# Patient Record
Sex: Female | Born: 1960 | Race: Black or African American | Hispanic: No | Marital: Single | Smoking: Former smoker
Health system: Southern US, Community
[De-identification: ages and names within clinical notes are randomized; demographics above are authoritative.]

## PROBLEM LIST (undated history)

## (undated) DIAGNOSIS — J42 Unspecified chronic bronchitis: Secondary | ICD-10-CM

## (undated) DIAGNOSIS — F419 Anxiety disorder, unspecified: Secondary | ICD-10-CM

## (undated) DIAGNOSIS — K922 Gastrointestinal hemorrhage, unspecified: Secondary | ICD-10-CM

## (undated) DIAGNOSIS — R102 Pelvic and perineal pain: Secondary | ICD-10-CM

## (undated) DIAGNOSIS — J189 Pneumonia, unspecified organism: Secondary | ICD-10-CM

## (undated) DIAGNOSIS — G5603 Carpal tunnel syndrome, bilateral upper limbs: Secondary | ICD-10-CM

## (undated) DIAGNOSIS — G8929 Other chronic pain: Secondary | ICD-10-CM

## (undated) DIAGNOSIS — E7800 Pure hypercholesterolemia, unspecified: Secondary | ICD-10-CM

## (undated) DIAGNOSIS — E039 Hypothyroidism, unspecified: Secondary | ICD-10-CM

## (undated) DIAGNOSIS — I209 Angina pectoris, unspecified: Secondary | ICD-10-CM

## (undated) DIAGNOSIS — N950 Postmenopausal bleeding: Secondary | ICD-10-CM

## (undated) DIAGNOSIS — M545 Low back pain, unspecified: Secondary | ICD-10-CM

## (undated) DIAGNOSIS — D509 Iron deficiency anemia, unspecified: Secondary | ICD-10-CM

## (undated) DIAGNOSIS — B20 Human immunodeficiency virus [HIV] disease: Secondary | ICD-10-CM

## (undated) DIAGNOSIS — I10 Essential (primary) hypertension: Secondary | ICD-10-CM

## (undated) DIAGNOSIS — M7550 Bursitis of unspecified shoulder: Secondary | ICD-10-CM

## (undated) DIAGNOSIS — I2510 Atherosclerotic heart disease of native coronary artery without angina pectoris: Secondary | ICD-10-CM

## (undated) DIAGNOSIS — F32A Depression, unspecified: Secondary | ICD-10-CM

## (undated) DIAGNOSIS — D219 Benign neoplasm of connective and other soft tissue, unspecified: Secondary | ICD-10-CM

## (undated) DIAGNOSIS — Z9889 Other specified postprocedural states: Secondary | ICD-10-CM

## (undated) DIAGNOSIS — M1990 Unspecified osteoarthritis, unspecified site: Secondary | ICD-10-CM

## (undated) DIAGNOSIS — I639 Cerebral infarction, unspecified: Secondary | ICD-10-CM

## (undated) DIAGNOSIS — Z8619 Personal history of other infectious and parasitic diseases: Secondary | ICD-10-CM

## (undated) DIAGNOSIS — R638 Other symptoms and signs concerning food and fluid intake: Secondary | ICD-10-CM

## (undated) DIAGNOSIS — I509 Heart failure, unspecified: Secondary | ICD-10-CM

## (undated) DIAGNOSIS — Z87898 Personal history of other specified conditions: Secondary | ICD-10-CM

## (undated) DIAGNOSIS — Z5189 Encounter for other specified aftercare: Secondary | ICD-10-CM

## (undated) DIAGNOSIS — J449 Chronic obstructive pulmonary disease, unspecified: Secondary | ICD-10-CM

## (undated) DIAGNOSIS — K819 Cholecystitis, unspecified: Secondary | ICD-10-CM

## (undated) DIAGNOSIS — I219 Acute myocardial infarction, unspecified: Secondary | ICD-10-CM

## (undated) DIAGNOSIS — F329 Major depressive disorder, single episode, unspecified: Secondary | ICD-10-CM

## (undated) HISTORY — DX: Personal history of other specified conditions: Z87.898

## (undated) HISTORY — PX: COLONOSCOPY: SHX174

## (undated) HISTORY — DX: Encounter for other specified aftercare: Z51.89

## (undated) HISTORY — DX: Personal history of other infectious and parasitic diseases: Z86.19

## (undated) HISTORY — DX: Major depressive disorder, single episode, unspecified: F32.9

## (undated) HISTORY — DX: Other symptoms and signs concerning food and fluid intake: R63.8

## (undated) HISTORY — DX: Anxiety disorder, unspecified: F41.9

## (undated) HISTORY — PX: EYE SURGERY: SHX253

## (undated) HISTORY — DX: Pelvic and perineal pain: R10.2

## (undated) HISTORY — DX: Postmenopausal bleeding: N95.0

## (undated) HISTORY — DX: Depression, unspecified: F32.A

## (undated) HISTORY — DX: Essential (primary) hypertension: I10

## (undated) HISTORY — PX: KNEE ARTHROSCOPY: SHX127

## (undated) HISTORY — DX: Atherosclerotic heart disease of native coronary artery without angina pectoris: I25.10

## (undated) HISTORY — PX: CORONARY ANGIOPLASTY WITH STENT PLACEMENT: SHX49

---
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
## 1989-05-07 HISTORY — PX: CHOLECYSTECTOMY: SHX55

## 1991-09-07 HISTORY — PX: RETINAL LASER PROCEDURE: SHX2339

## 1993-09-06 DIAGNOSIS — IMO0001 Reserved for inherently not codable concepts without codable children: Secondary | ICD-10-CM

## 1993-09-06 DIAGNOSIS — Z5189 Encounter for other specified aftercare: Secondary | ICD-10-CM

## 1993-09-06 HISTORY — DX: Encounter for other specified aftercare: Z51.89

## 1993-09-06 HISTORY — DX: Reserved for inherently not codable concepts without codable children: IMO0001

## 2004-07-07 DIAGNOSIS — I219 Acute myocardial infarction, unspecified: Secondary | ICD-10-CM

## 2004-07-07 HISTORY — DX: Acute myocardial infarction, unspecified: I21.9

## 2007-06-23 ENCOUNTER — Inpatient Hospital Stay (HOSPITAL_COMMUNITY): Admission: EM | Admit: 2007-06-23 | Discharge: 2007-06-27 | Payer: Self-pay | Admitting: Emergency Medicine

## 2007-06-23 ENCOUNTER — Ambulatory Visit: Payer: Self-pay | Admitting: Internal Medicine

## 2007-06-27 ENCOUNTER — Encounter: Payer: Self-pay | Admitting: Internal Medicine

## 2007-07-08 HISTORY — PX: CARDIAC CATHETERIZATION: SHX172

## 2007-07-12 ENCOUNTER — Encounter: Payer: Self-pay | Admitting: Internal Medicine

## 2007-07-26 ENCOUNTER — Ambulatory Visit: Payer: Self-pay | Admitting: Internal Medicine

## 2007-07-26 ENCOUNTER — Encounter: Admission: RE | Admit: 2007-07-26 | Discharge: 2007-07-26 | Payer: Self-pay | Admitting: Internal Medicine

## 2007-07-26 DIAGNOSIS — B20 Human immunodeficiency virus [HIV] disease: Secondary | ICD-10-CM

## 2007-07-26 LAB — CONVERTED CEMR LAB
ALT: <8 units/L (ref 0–35)
Bilirubin Urine: NEGATIVE
CO2: 24 meq/L (ref 19–32)
Calcium: 9.1 mg/dL (ref 8.4–10.5)
Chlamydia, Swab/Urine, PCR: NEGATIVE
Chloride: 106 meq/L (ref 96–112)
Cholesterol: 162 mg/dL (ref 0–200)
Creatinine, Ser: 0.73 mg/dL (ref 0.40–1.20)
Eosinophils Relative: 2 % (ref 0–5)
GC Probe Amp, Urine: NEGATIVE
Glucose, Bld: 82 mg/dL (ref 70–99)
HCT: 36.1 % (ref 36.0–46.0)
HIV-1 antibody: POSITIVE — AB
HIV-2 Ab: UNDETERMINED — AB
HIV: REACTIVE
Hemoglobin, Urine: NEGATIVE
Hemoglobin: 11.2 g/dL — ABNORMAL LOW (ref 12.0–15.0)
Hep B Core Total Ab: POSITIVE — AB
Lymphocytes Relative: 45 % (ref 12–46)
Lymphs Abs: 1.9 10*3/uL (ref 0.7–4.0)
Monocytes Absolute: 0.5 10*3/uL (ref 0.1–1.0)
Monocytes Relative: 11 % (ref 3–12)
Neutro Abs: 1.8 10*3/uL (ref 1.7–7.7)
Protein, ur: NEGATIVE mg/dL
RBC: 3.59 M/uL — ABNORMAL LOW (ref 3.87–5.11)
RDW: 13.6 % (ref 11.5–15.5)
Total Bilirubin: 0.5 mg/dL (ref 0.3–1.2)
Total CHOL/HDL Ratio: 3
Triglycerides: 80 mg/dL (ref <150)
Urine Glucose: NEGATIVE mg/dL
Urobilinogen, UA: 0.2 (ref 0.0–1.0)
VLDL: 16 mg/dL (ref 0–40)
WBC: 4.3 10*3/uL (ref 4.0–10.5)

## 2007-07-31 ENCOUNTER — Encounter: Payer: Self-pay | Admitting: Internal Medicine

## 2007-08-02 ENCOUNTER — Ambulatory Visit: Payer: Self-pay | Admitting: Internal Medicine

## 2007-08-02 DIAGNOSIS — I2510 Atherosclerotic heart disease of native coronary artery without angina pectoris: Secondary | ICD-10-CM | POA: Insufficient documentation

## 2007-08-02 DIAGNOSIS — I10 Essential (primary) hypertension: Secondary | ICD-10-CM

## 2007-08-02 DIAGNOSIS — T85698A Other mechanical complication of other specified internal prosthetic devices, implants and grafts, initial encounter: Secondary | ICD-10-CM

## 2007-08-02 DIAGNOSIS — F411 Generalized anxiety disorder: Secondary | ICD-10-CM | POA: Insufficient documentation

## 2007-08-02 DIAGNOSIS — B009 Herpesviral infection, unspecified: Secondary | ICD-10-CM | POA: Insufficient documentation

## 2007-08-02 DIAGNOSIS — E785 Hyperlipidemia, unspecified: Secondary | ICD-10-CM

## 2007-08-02 DIAGNOSIS — F3289 Other specified depressive episodes: Secondary | ICD-10-CM | POA: Insufficient documentation

## 2007-08-02 DIAGNOSIS — F329 Major depressive disorder, single episode, unspecified: Secondary | ICD-10-CM

## 2007-08-10 ENCOUNTER — Encounter (HOSPITAL_COMMUNITY): Admission: RE | Admit: 2007-08-10 | Discharge: 2007-09-06 | Payer: Self-pay | Admitting: Cardiology

## 2007-08-15 ENCOUNTER — Telehealth: Payer: Self-pay | Admitting: Internal Medicine

## 2007-09-12 ENCOUNTER — Inpatient Hospital Stay (HOSPITAL_COMMUNITY): Admission: EM | Admit: 2007-09-12 | Discharge: 2007-09-15 | Payer: Self-pay | Admitting: Emergency Medicine

## 2007-09-13 ENCOUNTER — Encounter: Payer: Self-pay | Admitting: Internal Medicine

## 2007-09-15 ENCOUNTER — Ambulatory Visit: Payer: Self-pay | Admitting: *Deleted

## 2007-09-15 ENCOUNTER — Encounter: Payer: Self-pay | Admitting: Internal Medicine

## 2007-09-15 ENCOUNTER — Inpatient Hospital Stay (HOSPITAL_COMMUNITY): Admission: RE | Admit: 2007-09-15 | Discharge: 2007-09-21 | Payer: Self-pay | Admitting: *Deleted

## 2007-10-05 ENCOUNTER — Telehealth (INDEPENDENT_AMBULATORY_CARE_PROVIDER_SITE_OTHER): Payer: Self-pay | Admitting: *Deleted

## 2007-10-09 ENCOUNTER — Telehealth: Payer: Self-pay

## 2007-10-10 ENCOUNTER — Encounter: Admission: RE | Admit: 2007-10-10 | Discharge: 2007-10-10 | Payer: Self-pay | Admitting: Internal Medicine

## 2007-10-10 ENCOUNTER — Ambulatory Visit: Payer: Self-pay | Admitting: Internal Medicine

## 2007-10-10 LAB — CONVERTED CEMR LAB
ALT: <8 units/L (ref 0–35)
AST: 13 units/L (ref 0–37)
Alkaline Phosphatase: 40 units/L (ref 39–117)
Basophils Absolute: 0 10*3/uL (ref 0.0–0.1)
Basophils Relative: 0 % (ref 0–1)
Cholesterol: 154 mg/dL (ref 0–200)
Creatinine, Ser: 0.78 mg/dL (ref 0.40–1.20)
Eosinophils Absolute: 0.1 10*3/uL (ref 0.0–0.7)
Eosinophils Relative: 2 % (ref 0–5)
HCT: 34.9 % — ABNORMAL LOW (ref 36.0–46.0)
HIV 1 RNA Quant: 111 copies/mL — ABNORMAL HIGH (ref <50)
Hemoglobin: 11.2 g/dL — ABNORMAL LOW (ref 12.0–15.0)
LDL Cholesterol: 93 mg/dL (ref 0–99)
MCHC: 32.1 g/dL (ref 30.0–36.0)
Monocytes Absolute: 0.5 10*3/uL (ref 0.1–1.0)
Platelets: 292 10*3/uL (ref 150–400)
RDW: 13.6 % (ref 11.5–15.5)
Sodium: 138 meq/L (ref 135–145)
Total Bilirubin: 0.6 mg/dL (ref 0.3–1.2)
Total CHOL/HDL Ratio: 3.7
VLDL: 19 mg/dL (ref 0–40)

## 2007-10-11 DIAGNOSIS — Z87898 Personal history of other specified conditions: Secondary | ICD-10-CM

## 2007-10-25 ENCOUNTER — Encounter (INDEPENDENT_AMBULATORY_CARE_PROVIDER_SITE_OTHER): Payer: Self-pay | Admitting: *Deleted

## 2007-10-27 ENCOUNTER — Emergency Department (HOSPITAL_COMMUNITY): Admission: EM | Admit: 2007-10-27 | Discharge: 2007-10-27 | Payer: Self-pay | Admitting: Emergency Medicine

## 2007-10-27 ENCOUNTER — Ambulatory Visit: Payer: Self-pay | Admitting: Internal Medicine

## 2007-10-27 ENCOUNTER — Encounter: Payer: Self-pay | Admitting: Internal Medicine

## 2007-10-31 ENCOUNTER — Ambulatory Visit: Payer: Self-pay | Admitting: Internal Medicine

## 2007-10-31 ENCOUNTER — Ambulatory Visit (HOSPITAL_COMMUNITY): Admission: RE | Admit: 2007-10-31 | Discharge: 2007-10-31 | Payer: Self-pay | Admitting: Internal Medicine

## 2007-10-31 ENCOUNTER — Emergency Department (HOSPITAL_COMMUNITY): Admission: EM | Admit: 2007-10-31 | Discharge: 2007-11-01 | Payer: Self-pay | Admitting: Emergency Medicine

## 2007-10-31 ENCOUNTER — Encounter (INDEPENDENT_AMBULATORY_CARE_PROVIDER_SITE_OTHER): Payer: Self-pay | Admitting: *Deleted

## 2007-10-31 DIAGNOSIS — M25559 Pain in unspecified hip: Secondary | ICD-10-CM | POA: Insufficient documentation

## 2007-11-01 ENCOUNTER — Encounter: Payer: Self-pay | Admitting: Internal Medicine

## 2007-11-15 ENCOUNTER — Encounter (INDEPENDENT_AMBULATORY_CARE_PROVIDER_SITE_OTHER): Payer: Self-pay | Admitting: *Deleted

## 2007-11-22 ENCOUNTER — Telehealth: Payer: Self-pay | Admitting: Internal Medicine

## 2007-11-24 ENCOUNTER — Ambulatory Visit: Payer: Self-pay | Admitting: Internal Medicine

## 2007-11-30 ENCOUNTER — Encounter (INDEPENDENT_AMBULATORY_CARE_PROVIDER_SITE_OTHER): Payer: Self-pay | Admitting: *Deleted

## 2007-11-30 ENCOUNTER — Ambulatory Visit: Payer: Self-pay | Admitting: Internal Medicine

## 2007-11-30 ENCOUNTER — Encounter: Admission: RE | Admit: 2007-11-30 | Discharge: 2007-11-30 | Payer: Self-pay | Admitting: Internal Medicine

## 2007-11-30 LAB — CONVERTED CEMR LAB
ALT: 10 units/L (ref 0–35)
AST: 13 units/L (ref 0–37)
Basophils Absolute: 0 10*3/uL (ref 0.0–0.1)
Basophils Relative: 1 % (ref 0–1)
Chloride: 106 meq/L (ref 96–112)
Creatinine, Ser: 0.7 mg/dL (ref 0.40–1.20)
Eosinophils Relative: 10 % — ABNORMAL HIGH (ref 0–5)
Hemoglobin: 11.1 g/dL — ABNORMAL LOW (ref 12.0–15.0)
MCHC: 31.7 g/dL (ref 30.0–36.0)
Monocytes Absolute: 0.7 10*3/uL (ref 0.1–1.0)
Neutro Abs: 1.8 10*3/uL (ref 1.7–7.7)
Platelets: 238 10*3/uL (ref 150–400)
RDW: 13.1 % (ref 11.5–15.5)
Total Bilirubin: 0.3 mg/dL (ref 0.3–1.2)

## 2007-12-01 ENCOUNTER — Ambulatory Visit: Payer: Self-pay | Admitting: Internal Medicine

## 2007-12-01 DIAGNOSIS — J209 Acute bronchitis, unspecified: Secondary | ICD-10-CM

## 2007-12-01 DIAGNOSIS — K130 Diseases of lips: Secondary | ICD-10-CM | POA: Insufficient documentation

## 2007-12-05 ENCOUNTER — Encounter: Payer: Self-pay | Admitting: Internal Medicine

## 2007-12-07 ENCOUNTER — Encounter (HOSPITAL_COMMUNITY): Admission: RE | Admit: 2007-12-07 | Discharge: 2008-03-06 | Payer: Self-pay | Admitting: Cardiology

## 2007-12-10 ENCOUNTER — Emergency Department (HOSPITAL_COMMUNITY): Admission: EM | Admit: 2007-12-10 | Discharge: 2007-12-10 | Payer: Self-pay | Admitting: Emergency Medicine

## 2007-12-19 ENCOUNTER — Ambulatory Visit: Payer: Self-pay | Admitting: Internal Medicine

## 2007-12-19 DIAGNOSIS — R110 Nausea: Secondary | ICD-10-CM | POA: Insufficient documentation

## 2007-12-25 ENCOUNTER — Encounter: Payer: Self-pay | Admitting: Internal Medicine

## 2008-01-25 ENCOUNTER — Emergency Department (HOSPITAL_COMMUNITY): Admission: EM | Admit: 2008-01-25 | Discharge: 2008-01-25 | Payer: Self-pay | Admitting: Family Medicine

## 2008-01-31 ENCOUNTER — Emergency Department (HOSPITAL_COMMUNITY): Admission: EM | Admit: 2008-01-31 | Discharge: 2008-01-31 | Payer: Self-pay | Admitting: Emergency Medicine

## 2008-02-14 ENCOUNTER — Ambulatory Visit: Payer: Self-pay | Admitting: Internal Medicine

## 2008-02-14 ENCOUNTER — Telehealth: Payer: Self-pay

## 2008-02-14 DIAGNOSIS — M67919 Unspecified disorder of synovium and tendon, unspecified shoulder: Secondary | ICD-10-CM | POA: Insufficient documentation

## 2008-02-14 DIAGNOSIS — M719 Bursopathy, unspecified: Secondary | ICD-10-CM

## 2008-02-29 ENCOUNTER — Emergency Department (HOSPITAL_COMMUNITY): Admission: EM | Admit: 2008-02-29 | Discharge: 2008-02-29 | Payer: Self-pay | Admitting: Emergency Medicine

## 2008-03-04 ENCOUNTER — Emergency Department (HOSPITAL_COMMUNITY): Admission: EM | Admit: 2008-03-04 | Discharge: 2008-03-05 | Payer: Self-pay | Admitting: Emergency Medicine

## 2008-03-07 ENCOUNTER — Encounter (HOSPITAL_COMMUNITY): Admission: RE | Admit: 2008-03-07 | Discharge: 2008-04-06 | Payer: Self-pay | Admitting: Cardiology

## 2008-03-18 ENCOUNTER — Encounter: Admission: RE | Admit: 2008-03-18 | Discharge: 2008-03-18 | Payer: Self-pay | Admitting: Internal Medicine

## 2008-03-18 ENCOUNTER — Ambulatory Visit: Payer: Self-pay | Admitting: Internal Medicine

## 2008-03-18 LAB — CONVERTED CEMR LAB
AST: 17 units/L (ref 0–37)
Alkaline Phosphatase: 59 units/L (ref 39–117)
BUN: 10 mg/dL (ref 6–23)
Basophils Relative: 0 % (ref 0–1)
Calcium: 9 mg/dL (ref 8.4–10.5)
Creatinine, Ser: 0.53 mg/dL (ref 0.40–1.20)
Eosinophils Absolute: 0.2 10*3/uL (ref 0.0–0.7)
Hemoglobin: 11.1 g/dL — ABNORMAL LOW (ref 12.0–15.0)
MCHC: 31.4 g/dL (ref 30.0–36.0)
MCV: 85.9 fL (ref 78.0–100.0)
Monocytes Absolute: 0.6 10*3/uL (ref 0.1–1.0)
Monocytes Relative: 11 % (ref 3–12)
RBC: 4.12 M/uL (ref 3.87–5.11)

## 2008-04-02 ENCOUNTER — Ambulatory Visit: Payer: Self-pay | Admitting: Internal Medicine

## 2008-04-02 DIAGNOSIS — N644 Mastodynia: Secondary | ICD-10-CM

## 2008-04-02 LAB — CONVERTED CEMR LAB
FSH: 2.5 milliintl units/mL
Prolactin: 32 ng/mL

## 2008-04-03 ENCOUNTER — Telehealth: Payer: Self-pay | Admitting: Licensed Clinical Social Worker

## 2008-04-05 ENCOUNTER — Emergency Department (HOSPITAL_COMMUNITY): Admission: EM | Admit: 2008-04-05 | Discharge: 2008-04-06 | Payer: Self-pay | Admitting: Emergency Medicine

## 2008-04-07 ENCOUNTER — Emergency Department (HOSPITAL_COMMUNITY): Admission: EM | Admit: 2008-04-07 | Discharge: 2008-04-07 | Payer: Self-pay | Admitting: Emergency Medicine

## 2008-04-16 ENCOUNTER — Telehealth: Payer: Self-pay | Admitting: Internal Medicine

## 2008-04-24 ENCOUNTER — Encounter: Payer: Self-pay | Admitting: Internal Medicine

## 2008-04-29 ENCOUNTER — Encounter: Payer: Self-pay | Admitting: Licensed Clinical Social Worker

## 2008-04-29 ENCOUNTER — Encounter: Payer: Self-pay | Admitting: Internal Medicine

## 2008-05-01 ENCOUNTER — Emergency Department (HOSPITAL_COMMUNITY): Admission: EM | Admit: 2008-05-01 | Discharge: 2008-05-01 | Payer: Self-pay | Admitting: Emergency Medicine

## 2008-05-02 ENCOUNTER — Telehealth (INDEPENDENT_AMBULATORY_CARE_PROVIDER_SITE_OTHER): Payer: Self-pay | Admitting: *Deleted

## 2008-05-03 ENCOUNTER — Ambulatory Visit: Payer: Self-pay | Admitting: Internal Medicine

## 2008-05-03 DIAGNOSIS — M79609 Pain in unspecified limb: Secondary | ICD-10-CM | POA: Insufficient documentation

## 2008-05-07 ENCOUNTER — Encounter: Payer: Self-pay | Admitting: Internal Medicine

## 2008-05-09 ENCOUNTER — Telehealth: Payer: Self-pay | Admitting: Internal Medicine

## 2008-05-30 ENCOUNTER — Emergency Department (HOSPITAL_COMMUNITY): Admission: EM | Admit: 2008-05-30 | Discharge: 2008-05-31 | Payer: Self-pay | Admitting: Emergency Medicine

## 2008-06-10 ENCOUNTER — Ambulatory Visit: Payer: Self-pay | Admitting: *Deleted

## 2008-06-10 ENCOUNTER — Telehealth: Payer: Self-pay

## 2008-06-10 ENCOUNTER — Inpatient Hospital Stay (HOSPITAL_COMMUNITY): Admission: EM | Admit: 2008-06-10 | Discharge: 2008-06-12 | Payer: Self-pay | Admitting: Family Medicine

## 2008-06-18 ENCOUNTER — Encounter (INDEPENDENT_AMBULATORY_CARE_PROVIDER_SITE_OTHER): Payer: Self-pay | Admitting: Internal Medicine

## 2008-07-02 ENCOUNTER — Telehealth (INDEPENDENT_AMBULATORY_CARE_PROVIDER_SITE_OTHER): Payer: Self-pay | Admitting: *Deleted

## 2008-07-04 ENCOUNTER — Ambulatory Visit: Payer: Self-pay | Admitting: Internal Medicine

## 2008-07-04 LAB — CONVERTED CEMR LAB
ALT: 12 units/L (ref 0–35)
AST: 21 units/L (ref 0–37)
Albumin: 4.2 g/dL (ref 3.5–5.2)
BUN: 12 mg/dL (ref 6–23)
Basophils Absolute: 0 10*3/uL (ref 0.0–0.1)
Basophils Relative: 1 % (ref 0–1)
Calcium: 9.7 mg/dL (ref 8.4–10.5)
Chloride: 102 meq/L (ref 96–112)
MCHC: 29.9 g/dL — ABNORMAL LOW (ref 30.0–36.0)
Monocytes Relative: 16 % — ABNORMAL HIGH (ref 3–12)
Neutro Abs: 1.7 10*3/uL (ref 1.7–7.7)
Neutrophils Relative %: 38 % — ABNORMAL LOW (ref 43–77)
Platelets: 268 10*3/uL (ref 150–400)
Potassium: 4.7 meq/L (ref 3.5–5.3)
RBC: 4.67 M/uL (ref 3.87–5.11)
Sodium: 136 meq/L (ref 135–145)
Total Protein: 8.2 g/dL (ref 6.0–8.3)
WBC: 4.3 10*3/uL (ref 4.0–10.5)

## 2008-07-16 ENCOUNTER — Ambulatory Visit: Payer: Self-pay | Admitting: Internal Medicine

## 2008-10-21 ENCOUNTER — Ambulatory Visit: Payer: Self-pay | Admitting: Internal Medicine

## 2008-10-21 LAB — CONVERTED CEMR LAB
ALT: 13 units/L (ref 0–35)
Alkaline Phosphatase: 40 units/L (ref 39–117)
Basophils Absolute: 0 10*3/uL (ref 0.0–0.1)
Creatinine, Ser: 0.77 mg/dL (ref 0.40–1.20)
Eosinophils Absolute: 0.1 10*3/uL (ref 0.0–0.7)
Eosinophils Relative: 2 % (ref 0–5)
HCT: 32.2 % — ABNORMAL LOW (ref 36.0–46.0)
HIV-1 RNA Quant, Log: 3.32 — ABNORMAL HIGH (ref <1.68)
MCHC: 32.3 g/dL (ref 30.0–36.0)
MCV: 82.8 fL (ref 78.0–100.0)
Monocytes Absolute: 0.5 10*3/uL (ref 0.1–1.0)
Platelets: 224 10*3/uL (ref 150–400)
RDW: 15.7 % — ABNORMAL HIGH (ref 11.5–15.5)
Sodium: 143 meq/L (ref 135–145)
Total Bilirubin: 0.4 mg/dL (ref 0.3–1.2)
Total Protein: 8.3 g/dL (ref 6.0–8.3)

## 2008-10-30 ENCOUNTER — Ambulatory Visit: Payer: Self-pay | Admitting: Internal Medicine

## 2008-10-30 DIAGNOSIS — B3731 Acute candidiasis of vulva and vagina: Secondary | ICD-10-CM | POA: Insufficient documentation

## 2008-10-30 DIAGNOSIS — B373 Candidiasis of vulva and vagina: Secondary | ICD-10-CM

## 2008-12-04 ENCOUNTER — Encounter: Payer: Self-pay | Admitting: Internal Medicine

## 2008-12-09 ENCOUNTER — Emergency Department (HOSPITAL_COMMUNITY): Admission: EM | Admit: 2008-12-09 | Discharge: 2008-12-09 | Payer: Self-pay | Admitting: Family Medicine

## 2008-12-16 ENCOUNTER — Encounter: Payer: Self-pay | Admitting: Internal Medicine

## 2008-12-16 ENCOUNTER — Ambulatory Visit: Payer: Self-pay | Admitting: Internal Medicine

## 2008-12-16 LAB — CONVERTED CEMR LAB
ALT: 9 units/L (ref 0–35)
AST: 16 units/L (ref 0–37)
Albumin: 3.8 g/dL (ref 3.5–5.2)
BUN: 10 mg/dL (ref 6–23)
Basophils Absolute: 0 10*3/uL (ref 0.0–0.1)
Basophils Relative: 0 % (ref 0–1)
CO2: 19 meq/L (ref 19–32)
Calcium: 8.5 mg/dL (ref 8.4–10.5)
Chloride: 108 meq/L (ref 96–112)
Cholesterol: 207 mg/dL — ABNORMAL HIGH (ref 0–200)
Creatinine, Ser: 0.66 mg/dL (ref 0.40–1.20)
Eosinophils Absolute: 0.1 10*3/uL (ref 0.0–0.7)
GFR calc Af Amer: >60 mL/min (ref >60)
HIV 1 RNA Quant: 213 copies/mL — ABNORMAL HIGH (ref <48)
HIV-1 RNA Quant, Log: 2.33 — ABNORMAL HIGH (ref <1.68)
MCHC: 29.6 g/dL — ABNORMAL LOW (ref 30.0–36.0)
MCV: 77.8 fL — ABNORMAL LOW (ref 78.0–100.0)
Monocytes Relative: 9 % (ref 3–12)
Neutro Abs: 3 10*3/uL (ref 1.7–7.7)
Neutrophils Relative %: 67 % (ref 43–77)
Platelets: 255 10*3/uL (ref 150–400)
Potassium: 3.7 meq/L (ref 3.5–5.3)
RBC: 4.6 M/uL (ref 3.87–5.11)
Total CHOL/HDL Ratio: 3.8

## 2008-12-20 ENCOUNTER — Encounter: Payer: Self-pay | Admitting: Internal Medicine

## 2009-01-08 ENCOUNTER — Ambulatory Visit: Payer: Self-pay | Admitting: Internal Medicine

## 2009-02-10 ENCOUNTER — Telehealth (INDEPENDENT_AMBULATORY_CARE_PROVIDER_SITE_OTHER): Payer: Self-pay | Admitting: Licensed Clinical Social Worker

## 2009-03-07 ENCOUNTER — Emergency Department (HOSPITAL_COMMUNITY): Admission: EM | Admit: 2009-03-07 | Discharge: 2009-03-07 | Payer: Self-pay | Admitting: Emergency Medicine

## 2009-04-08 ENCOUNTER — Ambulatory Visit: Payer: Self-pay | Admitting: Internal Medicine

## 2009-04-08 LAB — CONVERTED CEMR LAB: HIV-1 RNA Quant, Log: 2.09 — ABNORMAL HIGH (ref <1.68)

## 2009-04-09 LAB — CONVERTED CEMR LAB
ALT: 9 units/L (ref 0–35)
AST: 14 units/L (ref 0–37)
Basophils Absolute: 0 10*3/uL (ref 0.0–0.1)
CO2: 24 meq/L (ref 19–32)
Calcium: 8.4 mg/dL (ref 8.4–10.5)
Chloride: 106 meq/L (ref 96–112)
Creatinine, Ser: 0.72 mg/dL (ref 0.40–1.20)
Eosinophils Absolute: 0.2 10*3/uL (ref 0.0–0.7)
Eosinophils Relative: 5 % (ref 0–5)
HCT: 32.1 % — ABNORMAL LOW (ref 36.0–46.0)
Lymphocytes Relative: 33 % (ref 12–46)
Neutrophils Relative %: 52 % (ref 43–77)
Platelets: 255 10*3/uL (ref 150–400)
Potassium: 4.1 meq/L (ref 3.5–5.3)
RDW: 16.8 % — ABNORMAL HIGH (ref 11.5–15.5)
Sodium: 140 meq/L (ref 135–145)
Total Protein: 7.2 g/dL (ref 6.0–8.3)
WBC: 4.4 10*3/uL (ref 4.0–10.5)

## 2009-04-11 ENCOUNTER — Encounter: Payer: Self-pay | Admitting: Internal Medicine

## 2009-04-11 DIAGNOSIS — D509 Iron deficiency anemia, unspecified: Secondary | ICD-10-CM | POA: Insufficient documentation

## 2009-04-14 ENCOUNTER — Emergency Department (HOSPITAL_COMMUNITY): Admission: EM | Admit: 2009-04-14 | Discharge: 2009-04-14 | Payer: Self-pay | Admitting: Emergency Medicine

## 2009-04-22 ENCOUNTER — Ambulatory Visit: Payer: Self-pay | Admitting: Internal Medicine

## 2009-04-22 DIAGNOSIS — N926 Irregular menstruation, unspecified: Secondary | ICD-10-CM | POA: Insufficient documentation

## 2009-04-28 ENCOUNTER — Encounter: Payer: Self-pay | Admitting: Internal Medicine

## 2009-05-23 ENCOUNTER — Encounter (INDEPENDENT_AMBULATORY_CARE_PROVIDER_SITE_OTHER): Payer: Self-pay | Admitting: Cardiology

## 2009-05-23 ENCOUNTER — Inpatient Hospital Stay (HOSPITAL_COMMUNITY): Admission: EM | Admit: 2009-05-23 | Discharge: 2009-05-25 | Payer: Self-pay | Admitting: Emergency Medicine

## 2009-05-23 ENCOUNTER — Ambulatory Visit: Payer: Self-pay | Admitting: Internal Medicine

## 2009-05-23 ENCOUNTER — Encounter: Payer: Self-pay | Admitting: Internal Medicine

## 2009-06-17 ENCOUNTER — Telehealth (INDEPENDENT_AMBULATORY_CARE_PROVIDER_SITE_OTHER): Payer: Self-pay | Admitting: Licensed Clinical Social Worker

## 2009-06-23 ENCOUNTER — Ambulatory Visit: Payer: Self-pay | Admitting: Internal Medicine

## 2009-06-23 LAB — CONVERTED CEMR LAB
BUN: 12 mg/dL (ref 6–23)
Basophils Relative: 1 % (ref 0–1)
CO2: 22 meq/L (ref 19–32)
Creatinine, Ser: 0.63 mg/dL (ref 0.40–1.20)
Eosinophils Absolute: 0.1 10*3/uL (ref 0.0–0.7)
Glucose, Bld: 84 mg/dL (ref 70–99)
HIV 1 RNA Quant: <48 copies/mL (ref <48)
HIV-1 RNA Quant, Log: <1.68 (ref <1.68)
Hemoglobin: 9.6 g/dL — ABNORMAL LOW (ref 12.0–15.0)
MCHC: 28.9 g/dL — ABNORMAL LOW (ref 30.0–36.0)
MCV: 77.8 fL — ABNORMAL LOW (ref >=78.0)
Monocytes Absolute: 0.4 10*3/uL (ref 0.1–1.0)
Monocytes Relative: 11 % (ref 3–12)
RBC: 4.27 M/uL (ref 3.87–5.11)
RDW: 16.1 % — ABNORMAL HIGH (ref 11.5–15.5)
Total Bilirubin: 0.4 mg/dL (ref 0.3–1.2)
Total Protein: 7.4 g/dL (ref 6.0–8.3)

## 2009-07-08 ENCOUNTER — Ambulatory Visit: Payer: Self-pay | Admitting: Internal Medicine

## 2009-07-09 ENCOUNTER — Telehealth: Payer: Self-pay | Admitting: Licensed Clinical Social Worker

## 2009-07-15 ENCOUNTER — Ambulatory Visit: Payer: Self-pay | Admitting: Infectious Disease

## 2009-07-23 ENCOUNTER — Ambulatory Visit: Payer: Self-pay | Admitting: Obstetrics and Gynecology

## 2009-07-23 LAB — CONVERTED CEMR LAB
Hemoglobin: 9.5 g/dL — ABNORMAL LOW (ref 12.0–15.0)
RBC: 4.18 M/uL (ref 3.87–5.11)
RDW: 16.5 % — ABNORMAL HIGH (ref 11.5–15.5)
T3, Total: 125.3 ng/dL (ref 80.0–204.0)
TSH: 2.292 microintl units/mL (ref 0.350–4.500)
WBC: 6.3 10*3/uL (ref 4.0–10.5)

## 2009-07-28 ENCOUNTER — Ambulatory Visit (HOSPITAL_COMMUNITY): Admission: RE | Admit: 2009-07-28 | Discharge: 2009-07-28 | Payer: Self-pay | Admitting: Family Medicine

## 2009-07-30 ENCOUNTER — Encounter: Admission: RE | Admit: 2009-07-30 | Discharge: 2009-07-30 | Payer: Self-pay | Admitting: Obstetrics and Gynecology

## 2009-08-08 ENCOUNTER — Encounter: Admission: RE | Admit: 2009-08-08 | Discharge: 2009-08-08 | Payer: Self-pay | Admitting: Obstetrics and Gynecology

## 2009-08-13 ENCOUNTER — Ambulatory Visit: Payer: Self-pay | Admitting: Obstetrics and Gynecology

## 2009-08-18 ENCOUNTER — Telehealth: Payer: Self-pay | Admitting: Internal Medicine

## 2009-09-12 ENCOUNTER — Emergency Department (HOSPITAL_COMMUNITY): Admission: EM | Admit: 2009-09-12 | Discharge: 2009-09-12 | Payer: Self-pay | Admitting: Emergency Medicine

## 2009-09-12 ENCOUNTER — Telehealth (INDEPENDENT_AMBULATORY_CARE_PROVIDER_SITE_OTHER): Payer: Self-pay | Admitting: *Deleted

## 2009-09-19 ENCOUNTER — Ambulatory Visit: Payer: Self-pay | Admitting: Internal Medicine

## 2009-09-19 DIAGNOSIS — H6690 Otitis media, unspecified, unspecified ear: Secondary | ICD-10-CM | POA: Insufficient documentation

## 2009-09-23 ENCOUNTER — Telehealth: Payer: Self-pay | Admitting: Internal Medicine

## 2009-10-14 ENCOUNTER — Ambulatory Visit: Payer: Self-pay | Admitting: Internal Medicine

## 2009-10-14 LAB — CONVERTED CEMR LAB
ALT: 9 units/L (ref 0–35)
AST: 14 units/L (ref 0–37)
Basophils Absolute: 0 10*3/uL (ref 0.0–0.1)
Calcium: 9.5 mg/dL (ref 8.4–10.5)
Chloride: 107 meq/L (ref 96–112)
Creatinine, Ser: 0.75 mg/dL (ref 0.40–1.20)
Eosinophils Absolute: 0.1 10*3/uL (ref 0.0–0.7)
Eosinophils Relative: 3 % (ref 0–5)
HCT: 36.2 % (ref 36.0–46.0)
Lymphocytes Relative: 43 % (ref 12–46)
MCV: 77.5 fL — ABNORMAL LOW (ref >=78.0)
Platelets: 249 10*3/uL (ref 150–400)
RDW: 17.9 % — ABNORMAL HIGH (ref 11.5–15.5)

## 2009-10-22 ENCOUNTER — Ambulatory Visit: Payer: Self-pay | Admitting: Internal Medicine

## 2009-11-28 ENCOUNTER — Encounter: Payer: Self-pay | Admitting: Internal Medicine

## 2009-11-28 ENCOUNTER — Ambulatory Visit: Payer: Self-pay | Admitting: Internal Medicine

## 2009-11-28 ENCOUNTER — Inpatient Hospital Stay (HOSPITAL_COMMUNITY): Admission: EM | Admit: 2009-11-28 | Discharge: 2009-11-28 | Payer: Self-pay | Admitting: Emergency Medicine

## 2010-01-24 ENCOUNTER — Emergency Department (HOSPITAL_COMMUNITY): Admission: EM | Admit: 2010-01-24 | Discharge: 2010-01-24 | Payer: Self-pay | Admitting: Emergency Medicine
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
## 2010-01-26 ENCOUNTER — Ambulatory Visit: Payer: Self-pay | Admitting: Internal Medicine

## 2010-01-26 LAB — CONVERTED CEMR LAB
Albumin: 3.7 g/dL (ref 3.5–5.2)
Alkaline Phosphatase: 48 units/L (ref 39–117)
BUN: 12 mg/dL (ref 6–23)
Basophils Absolute: 0 10*3/uL (ref 0.0–0.1)
Basophils Relative: 0 % (ref 0–1)
Eosinophils Relative: 3 % (ref 0–5)
Glucose, Bld: 82 mg/dL (ref 70–99)
HCT: 36.1 % (ref 36.0–46.0)
HIV 1 RNA Quant: <48 copies/mL (ref <48)
HIV-1 RNA Quant, Log: <1.68 (ref <1.68)
Hemoglobin: 10.9 g/dL — ABNORMAL LOW (ref 12.0–15.0)
MCHC: 30.2 g/dL (ref 30.0–36.0)
MCV: 76.6 fL — ABNORMAL LOW (ref 78.0–100.0)
Monocytes Absolute: 0.3 10*3/uL (ref 0.1–1.0)
RDW: 17.1 % — ABNORMAL HIGH (ref 11.5–15.5)
Total Bilirubin: 0.4 mg/dL (ref 0.3–1.2)

## 2010-02-11 ENCOUNTER — Ambulatory Visit: Payer: Self-pay | Admitting: Internal Medicine

## 2010-02-25 ENCOUNTER — Other Ambulatory Visit: Payer: Self-pay | Admitting: Emergency Medicine

## 2010-02-26 ENCOUNTER — Inpatient Hospital Stay (HOSPITAL_COMMUNITY): Admission: AD | Admit: 2010-02-26 | Discharge: 2010-03-02 | Payer: Self-pay | Admitting: Psychiatry

## 2010-02-26 ENCOUNTER — Encounter: Payer: Self-pay | Admitting: Internal Medicine

## 2010-02-26 ENCOUNTER — Ambulatory Visit: Payer: Self-pay | Admitting: Psychiatry

## 2010-03-05 ENCOUNTER — Emergency Department (HOSPITAL_COMMUNITY): Admission: EM | Admit: 2010-03-05 | Discharge: 2010-03-06 | Payer: Self-pay | Admitting: Emergency Medicine

## 2010-05-01 ENCOUNTER — Ambulatory Visit: Payer: Self-pay | Admitting: Internal Medicine

## 2010-05-01 LAB — CONVERTED CEMR LAB: Pap Smear: NEGATIVE

## 2010-05-08 ENCOUNTER — Encounter: Payer: Self-pay | Admitting: Internal Medicine

## 2010-05-13 ENCOUNTER — Ambulatory Visit: Payer: Self-pay | Admitting: Internal Medicine

## 2010-05-13 LAB — CONVERTED CEMR LAB
ALT: 9 units/L (ref 0–35)
AST: 15 units/L (ref 0–37)
Albumin: 3.9 g/dL (ref 3.5–5.2)
Basophils Absolute: 0 10*3/uL (ref 0.0–0.1)
CO2: 28 meq/L (ref 19–32)
Calcium: 9.4 mg/dL (ref 8.4–10.5)
Chloride: 106 meq/L (ref 96–112)
Cholesterol: 207 mg/dL — ABNORMAL HIGH (ref 0–200)
Creatinine, Ser: 0.87 mg/dL (ref 0.40–1.20)
HIV 1 RNA Quant: <20 copies/mL (ref <48)
HIV-1 RNA Quant, Log: <1.3 (ref <1.68)
Hemoglobin: 12 g/dL (ref 12.0–15.0)
Lymphocytes Relative: 32 % (ref 12–46)
Monocytes Absolute: 0.3 10*3/uL (ref 0.1–1.0)
Neutro Abs: 2.1 10*3/uL (ref 1.7–7.7)
Neutrophils Relative %: 57 % (ref 43–77)
Potassium: 3.7 meq/L (ref 3.5–5.3)
RDW: 17.8 % — ABNORMAL HIGH (ref 11.5–15.5)
Sodium: 144 meq/L (ref 135–145)
Total Protein: 7 g/dL (ref 6.0–8.3)

## 2010-05-26 ENCOUNTER — Telehealth (INDEPENDENT_AMBULATORY_CARE_PROVIDER_SITE_OTHER): Payer: Self-pay | Admitting: *Deleted

## 2010-05-27 ENCOUNTER — Ambulatory Visit: Payer: Self-pay | Admitting: Internal Medicine

## 2010-05-27 DIAGNOSIS — N898 Other specified noninflammatory disorders of vagina: Secondary | ICD-10-CM | POA: Insufficient documentation

## 2010-05-27 DIAGNOSIS — N760 Acute vaginitis: Secondary | ICD-10-CM | POA: Insufficient documentation

## 2010-05-28 ENCOUNTER — Encounter: Payer: Self-pay | Admitting: Internal Medicine

## 2010-05-28 LAB — CONVERTED CEMR LAB
Candida species: NEGATIVE
Gardnerella vaginalis: POSITIVE — AB

## 2010-06-04 ENCOUNTER — Encounter: Payer: Self-pay | Admitting: Internal Medicine

## 2010-06-04 ENCOUNTER — Telehealth: Payer: Self-pay | Admitting: Internal Medicine

## 2010-06-11 ENCOUNTER — Telehealth: Payer: Self-pay | Admitting: Internal Medicine

## 2010-06-12 ENCOUNTER — Ambulatory Visit: Payer: Self-pay | Admitting: Internal Medicine

## 2010-06-18 ENCOUNTER — Encounter: Payer: Self-pay | Admitting: Internal Medicine

## 2010-06-29 ENCOUNTER — Ambulatory Visit: Payer: Self-pay | Admitting: Internal Medicine

## 2010-06-30 ENCOUNTER — Encounter: Payer: Self-pay | Admitting: Internal Medicine

## 2010-06-30 LAB — CONVERTED CEMR LAB
Calcium: 9.1 mg/dL (ref 8.4–10.5)
Candida species: NEGATIVE
Gardnerella vaginalis: POSITIVE — AB
Potassium: 3.6 meq/L (ref 3.5–5.3)
Sodium: 145 meq/L (ref 135–145)

## 2010-07-07 ENCOUNTER — Emergency Department (HOSPITAL_COMMUNITY): Admission: EM | Admit: 2010-07-07 | Discharge: 2010-07-07 | Payer: Self-pay | Admitting: Emergency Medicine

## 2010-07-24 ENCOUNTER — Ambulatory Visit: Payer: Self-pay | Admitting: Internal Medicine

## 2010-07-29 ENCOUNTER — Encounter (INDEPENDENT_AMBULATORY_CARE_PROVIDER_SITE_OTHER): Payer: Self-pay | Admitting: *Deleted

## 2010-08-03 ENCOUNTER — Telehealth: Payer: Self-pay | Admitting: Internal Medicine

## 2010-08-04 ENCOUNTER — Telehealth: Payer: Self-pay | Admitting: Internal Medicine

## 2010-08-04 LAB — CONVERTED CEMR LAB: Gardnerella vaginalis: POSITIVE — AB

## 2010-08-10 ENCOUNTER — Emergency Department (HOSPITAL_COMMUNITY)
Admission: EM | Admit: 2010-08-10 | Discharge: 2010-08-11 | Payer: Self-pay | Source: Home / Self Care | Admitting: Emergency Medicine

## 2010-09-06 HISTORY — PX: LEEP: SHX91

## 2010-09-07 ENCOUNTER — Emergency Department (HOSPITAL_COMMUNITY)
Admission: EM | Admit: 2010-09-07 | Discharge: 2010-09-07 | Payer: Self-pay | Source: Home / Self Care | Admitting: Emergency Medicine

## 2010-09-11 ENCOUNTER — Emergency Department (HOSPITAL_COMMUNITY)
Admission: EM | Admit: 2010-09-11 | Discharge: 2010-09-11 | Payer: Self-pay | Source: Home / Self Care | Admitting: Emergency Medicine

## 2010-09-15 ENCOUNTER — Ambulatory Visit
Admission: RE | Admit: 2010-09-15 | Discharge: 2010-09-15 | Payer: Self-pay | Source: Home / Self Care | Attending: Adult Health | Admitting: Adult Health

## 2010-09-15 DIAGNOSIS — J069 Acute upper respiratory infection, unspecified: Secondary | ICD-10-CM | POA: Insufficient documentation

## 2010-09-15 DIAGNOSIS — J45909 Unspecified asthma, uncomplicated: Secondary | ICD-10-CM | POA: Insufficient documentation

## 2010-09-22 ENCOUNTER — Ambulatory Visit
Admission: RE | Admit: 2010-09-22 | Discharge: 2010-09-22 | Payer: Self-pay | Source: Home / Self Care | Attending: Internal Medicine | Admitting: Internal Medicine

## 2010-09-22 ENCOUNTER — Encounter: Payer: Self-pay | Admitting: Internal Medicine

## 2010-09-22 LAB — CONVERTED CEMR LAB
ALT: 8 units/L (ref 0–35)
Alkaline Phosphatase: 53 units/L (ref 39–117)
Basophils Relative: 1 % (ref 0–1)
CO2: 27 meq/L (ref 19–32)
Creatinine, Ser: 0.77 mg/dL (ref 0.40–1.20)
Eosinophils Absolute: 0.1 10*3/uL (ref 0.0–0.7)
Eosinophils Relative: 3 % (ref 0–5)
HCT: 39.6 % (ref 36.0–46.0)
HIV 1 RNA Quant: <20 copies/mL (ref <20)
HIV-1 RNA Quant, Log: <1.3 (ref <1.30)
LDL Cholesterol: 156 mg/dL — ABNORMAL HIGH (ref 0–99)
Lymphs Abs: 1.1 10*3/uL (ref 0.7–4.0)
MCHC: 30.3 g/dL (ref 30.0–36.0)
MCV: 85 fL (ref 78.0–100.0)
Monocytes Relative: 10 % (ref 3–12)
Neutrophils Relative %: 56 % (ref 43–77)
Platelets: 223 10*3/uL (ref 150–400)
RBC: 4.66 M/uL (ref 3.87–5.11)
Sodium: 143 meq/L (ref 135–145)
Total Bilirubin: 0.5 mg/dL (ref 0.3–1.2)
Total Protein: 7.2 g/dL (ref 6.0–8.3)
VLDL: 17 mg/dL (ref 0–40)
WBC: 3.5 10*3/uL — ABNORMAL LOW (ref 4.0–10.5)

## 2010-09-28 LAB — T-HELPER CELL (CD4) - (RCID CLINIC ONLY): CD4 % Helper T Cell: 30 % — ABNORMAL LOW (ref 33–55)

## 2010-10-06 ENCOUNTER — Ambulatory Visit: Admit: 2010-10-06 | Payer: Self-pay | Admitting: Internal Medicine

## 2010-10-06 NOTE — Miscellaneous (Signed)
Summary: Hospital Follow up  Hospital Discharge  Date of admission:11/28/2009  Date of discharge:11/28/2009  Brief reason for admission/active problems: Chest pain 2/2 cocaine>> Started on cymbalta to possibly help fight cocaine cravings.  Followup needed: Adherence to medication. Inquire about abstinence from drugs.   The medication and problem lists have been updated.  Please see the dictated discharge summary for details.  Patient Instructions: 1)  Please return to [facility] internal medicine clinic on December 29, 2009 for hospital follow up with Dr. Theotis Barrio at 2:00pm. 2)  Take all medication as directed.        Prescriptions: CYMBALTA 20 MG CPEP (DULOXETINE HCL) Take 1 tablet by mouth once a day  #30 x 3   Entered and Authorized by:   Laren Everts MD   Signed by:   Laren Everts MD on 11/28/2009   Method used:   Print then Give to Patient   RxID:   [id number] NORVASC 5 MG TABS (AMLODIPINE BESYLATE) Take 1 tablet by mouth once a day  #30 x 1   Entered and Authorized by:   Laren Everts MD   Signed by:   Laren Everts MD on 11/28/2009   Method used:   Print then Give to Patient   RxID:   [id number]

## 2010-10-06 NOTE — Progress Notes (Signed)
Summary: Percocet RF request  Phone Note Call from Patient   Caller: Patient Summary of Call: Pt calling or refill of Percocet.  Is this ok? Tomasita Morrow RN  August 03, 2010 3:06 PM   Follow-up for Phone Call        ok x 1 Follow-up by: Yisroel Ramming MD,  August 04, 2010 3:55 PM    Prescriptions: PERCOCET 10-325 MG TABS (OXYCODONE-ACETAMINOPHEN) Take 1 tablet by mouth every 8 hours as needed  #60 x 0   Entered by:   Wendall Mola CMA ( AAMA)   Authorized by:   Yisroel Ramming MD   Signed by:   Wendall Mola CMA ( AAMA) on 08/04/2010   Method used:   Print then Give to Patient   RxID:   [id number] PERCOCET 10-325 MG TABS (OXYCODONE-ACETAMINOPHEN) Take 1 tablet by mouth every 8 hours as needed  #60 x 0   Entered by:   Wendall Mola CMA ( AAMA)   Authorized by:   Yisroel Ramming MD   Signed by:   Wendall Mola CMA ( AAMA) on 08/04/2010   Method used:   Telephoned to ...       [facility] [address] [facility] (retail)       [address]       [place], [place]  [id number]       Ph: [phone]       Fax: [phone]   RxID:   [id number]  unable to call in this RX Wendall Mola CMA Duncan Dull)  August 04, 2010 4:39 PM
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
## 2010-10-06 NOTE — Assessment & Plan Note (Signed)
Summary: PAP SMEAR/VS   Vital Signs:  Patient profile:   50 year old female Menstrual status:  perimenopausal LMP:     06/06/2009  Vitals Entered By: Jennet Maduro RN (May 01, 2010 10:25 AM) CC: Pt. here for PAP smear.  Given Educational materials re:  diet, exercise, nutrition, co-infection, PAP smears, BSE, mammograms and self-esteem.  Pt. has "herpes-like" lesion at upper buttocks crease, sacral area LMP (date): 06/06/2009 LMP - Character: heavy     Menstrual Status perimenopausal Enter LMP: 06/06/2009 Last PAP Result NEGATIVE FOR INTRAEPITHELIAL LESIONS OR MALIGNANCY.   Patient Instructions: 1)  Please schedule a follow-up appointment in 1 year. 2)  Your results should be ready in about 2 weeks.  I will either call you or send you a note in the mail. 3)  Thank you for coming to the Center for your care.   Evaluation and Follow-Up  Prevention For Positives: 05/01/2010   Safe sex practices discussed with patient. Condoms offered. Prior Medications: CYMBALTA 20 MG CPEP (DULOXETINE HCL) Take 1 tablet by mouth once a day NORVASC 5 MG TABS (AMLODIPINE BESYLATE) Take 1 tablet by mouth once a day COMBIVENT 103-18 MCG/ACT  AERO (ALBUTEROL-IPRATROPIUM) as needed AMBIEN 10 MG TABS (ZOLPIDEM TARTRATE) Take 1 tablet by mouth at bedtime prn TRUVADA 200-300 MG TABS (EMTRICITABINE-TENOFOVIR) Take 1 tablet by mouth once a day PREZISTA 400 MG TABS (DARUNAVIR ETHANOLATE) Take 2 tablets by mouth once a day PROMETHAZINE HCL 25 MG TABS (PROMETHAZINE HCL) Take 1 tablet by mouth every 8 hours prn VALTREX 500 MG TABS (VALACYCLOVIR HCL) Take 1 tablet by mouth two times a day NORVIR 100 MG TABS (RITONAVIR) Take 1 tablet by mouth once a day NEURONTIN 400 MG CAPS (GABAPENTIN) Take 1 tablet by mouth three times a day CLARITIN 10 MG TABS (LORATADINE) Take 1 tablet by mouth once a day HYDROXYZINE HCL 25 MG TABS (HYDROXYZINE HCL) Take 1 tablet by mouth every 8 hours as needed PERCOCET 10-325 MG  TABS (OXYCODONE-ACETAMINOPHEN) Take 1 tablet by mouth every 8 hours as needed Current Allergies: ! LIPITOR Orders Added: 1)  T-PAP [facility]) [88142] 2)  Est. Patient Research Study [04200]             Prevention For Positives: 05/01/2010   Safe sex practices discussed with patient. Condoms offered.

## 2010-10-06 NOTE — Progress Notes (Signed)
Summary: requesting a different RX  Phone Note Call from Patient   Caller: Patient Summary of Call: Pt. called stating she thinks Flagyl is not working for BV because she drank when she last took it.  Does not think she will be able to not drink while on it again and is requesting a gel or cream instead.  She is not willing to notify partner because it is someone she is no longer seeing. Initial call taken by: Wendall Mola CMA Duncan Dull),  August 04, 2010 4:06 PM  Follow-up for Phone Call        metronidazole gel 0.75% one full applicator intravaginally two times a day for 5 days Follow-up by: Yisroel Ramming MD,  August 05, 2010 11:41 AM    New/Updated Medications: METRONIDAZOLE 0.75 % GEL (METRONIDAZOLE) one full applicator intravaginally 2 times a day for 5 days Prescriptions: METRONIDAZOLE 0.75 % GEL (METRONIDAZOLE) one full applicator intravaginally 2 times a day for 5 days  #750 gm x 0   Entered by:   Wendall Mola CMA ( AAMA)   Authorized by:   Yisroel Ramming MD   Signed by:   Wendall Mola CMA ( AAMA) on 08/05/2010   Method used:   Telephoned to ...       [facility] (retail)       [address]       [place], [place]  [id number]       Ph: [phone]       Fax: [phone]   RxID:   [id number]

## 2010-10-06 NOTE — Assessment & Plan Note (Signed)
Summary: hand and leg cramps/TY   CC:  hand and leg cramps.  History of Present Illness: Pt c/o cramping in both legs and feet. She is here to get her potassium checked.  It is usually low when she gets these symptoms. She also stillhas a vaginal discharge and did not take her flagyl like she was supposed to last time she had BV.  Preventive Screening-Counseling & Management  Alcohol-Tobacco     Alcohol drinks/day: weekends     Alcohol type: beer     Smoking Status: never     Year Quit: 05/2006     Pack years: 1/2 per day over 15 years     Passive Smoke Exposure: yes  Caffeine-Diet-Exercise     Caffeine use/day: 0     Does Patient Exercise: yes     Type of exercise: walking/interested in buying a stationary bike and step master     Times/week: 3  Safety-Violence-Falls     Seat Belt Use: yes   Updated Prior Medication List: CYMBALTA 20 MG CPEP (DULOXETINE HCL) Take 1 tablet by mouth once a day NORVASC 5 MG TABS (AMLODIPINE BESYLATE) Take 1 tablet by mouth once a day COMBIVENT 103-18 MCG/ACT  AERO (ALBUTEROL-IPRATROPIUM) as needed AMBIEN 10 MG TABS (ZOLPIDEM TARTRATE) Take 1 tablet by mouth at bedtime prn TRUVADA 200-300 MG TABS (EMTRICITABINE-TENOFOVIR) Take 1 tablet by mouth once a day PREZISTA 400 MG TABS (DARUNAVIR ETHANOLATE) Take 2 tablets by mouth once a day PROMETHAZINE HCL 25 MG TABS (PROMETHAZINE HCL) Take 1 tablet by mouth every 8 hours prn VALTREX 500 MG TABS (VALACYCLOVIR HCL) Take 1 tablet by mouth two times a day NORVIR 100 MG TABS (RITONAVIR) Take 1 tablet by mouth once a day NEURONTIN 400 MG CAPS (GABAPENTIN) Take 1 tablet by mouth three times a day CLARITIN 10 MG TABS (LORATADINE) Take 1 tablet by mouth once a day HYDROXYZINE HCL 25 MG TABS (HYDROXYZINE HCL) Take 1 tablet by mouth every 8 hours as needed PERCOCET 10-325 MG TABS (OXYCODONE-ACETAMINOPHEN) Take 1 tablet by mouth every 8 hours as needed TUSSIONEX PENNKINETIC ER 10-8 MG/5ML LQCR (HYDROCOD  POLST-CHLORPHEN POLST) 5ml by mouth two times a day  Current Allergies (reviewed today): ! LIPITOR Past History:  Past Medical History: Last updated: 08/02/2007 Anxiety Depression Hyperlipidemia Hypertension  Review of Systems  The patient denies anorexia, fever, weight loss, and abdominal pain.    Vital Signs:  Patient profile:   50 year old female Menstrual status:  perimenopausal Height:      62 inches (157.48 cm) Weight:      230.4 pounds (104.73 kg) BMI:     42.29 Temp:     98.1 degrees F (36.72 degrees C) oral Pulse rate:   85 / minute BP sitting:   125 / 84  (left arm)  Vitals Entered By: Baxter Hire) (June 29, 2010 3:56 PM) CC: hand and leg cramps Pain Assessment Patient in pain? yes     Location: right hand Intensity: 4 Type: aching Onset of pain  doesn't know if hit hand, but is feeling better Nutritional Status BMI of > 30 = obese Nutritional Status Detail appetite is pretty good per patient  Does patient need assistance? Functional Status Self care Ambulation Normal   Physical Exam  General:  alert, well-developed, and well-nourished.   Head:  normocephalic and atraumatic.   Lungs:  normal breath sounds.   Msk:  no pain with palpation over muscles in legs    Impression & Recommendations:  Problem # 1:  GARDNERELLA VAGINALIS (ICD-616.10) check wet prep Orders: T-Wet Prep by Molecular Probe [phone]) Est. Patient Level III (10272)  Problem # 2:  LEG PAIN, BILATERAL (ICD-729.5) check potassium Orders: T-Basic Metabolic Panel [phone]) Est. Patient Level III (42595)

## 2010-10-06 NOTE — Letter (Signed)
Summary: [facility]  [address]   [place], [place] [address]   Phone: [phone]  Fax: [phone]    07/29/2010  [name] [address] [place], [place]  [address]  Dear Ms. Belfield,  I have made several attempts for you to call the office.  We have lab results for you and a prescription needs to be called to the pharmacy. Please give me a call at [phone].         Sincerely,    Wendall Mola CMA ( AAMA)

## 2010-10-06 NOTE — Progress Notes (Signed)
Summary: c/o cough  Phone Note Call from Patient   Caller: Patient Reason for Call: Acute Illness Summary of Call: pt. c/o bronchitis and is requesting cough med. with codeine Initial call taken by: Wendall Mola CMA Duncan Dull),  June 11, 2010 4:28 PM  Follow-up for Phone Call        needs eval Follow-up by: Yisroel Ramming MD,  June 11, 2010 4:42 PM  Additional Follow-up for Phone Call Additional follow up Details #1::        Left message on pts. voicemail to call in AM to schedule an appt. so she can be evaluated Additional Follow-up by: Wendall Mola CMA Duncan Dull),  June 11, 2010 4:57 PM

## 2010-10-06 NOTE — Miscellaneous (Signed)
Summary: Orders Update  Clinical Lists Changes  Orders: Added new Service order of Influenza Vaccine NON MCR (09811) - Signed Observations: Added new observation of FLU VAX#1VIS: 03/31/10 version given May 27, 2010. (05/27/2010 12:21) Added new observation of FLU VAXLOT: 1103 3P (05/27/2010 12:21) Added new observation of FLU VAX EXP: 12/06/2010 (05/27/2010 12:21) Added new observation of FLU VAXBY: Wendall Mola CMA ( AAMA) (05/27/2010 12:21) Added new observation of FLU VAXRTE: IM (05/27/2010 12:21) Added new observation of FLU VAX DSE: 0.5 ml (05/27/2010 12:21) Added new observation of FLU VAXMFR: Novartis (05/27/2010 12:21) Added new observation of FLU VAX SITE: left deltoid (05/27/2010 12:21) Added new observation of FLU VAX: Fluvax Non-MCR (05/27/2010 12:21)      Immunizations Administered:  Influenza Vaccine # 1:    Vaccine Type: Fluvax Non-MCR    Site: left deltoid    Mfr: Novartis    Dose: 0.5 ml    Route: IM    Given by: Wendall Mola CMA ( AAMA)    Exp. Date: 12/06/2010    Lot #: [id number]    VIS given: 03/31/10 version given May 27, 2010.  Flu Vaccine Consent Questions:    Do you have a history of severe allergic reactions to this vaccine? no    Any prior history of allergic reactions to egg and/or gelatin? no    Do you have a sensitivity to the preservative Thimersol? no    Do you have a past history of Guillan-Barre Syndrome? no    Do you currently have an acute febrile illness? no    Have you ever had a severe reaction to latex? no    Vaccine information given and explained to patient? yes    Are you currently pregnant? no  Appended Document: Orders Update    Clinical Lists Changes  Orders: Added new Service order of TB Skin Test [phone]) - Signed Added new Service order of Admin 1st Vaccine (29562) - Signed Observations: Added new observation of TB-PPD LOT#: [id number] (05/27/2010 16:42) Added new observation of TB-PPD EXP:  07/09/2011 (05/27/2010 16:42) Added new observation of TB-PPD BY: Wendall Mola CMA ( AAMA) (05/27/2010 16:42) Added new observation of TB-PPD RTE: ID (05/27/2010 16:42) Added new observation of TB-PPD DSE: 0.1 ml (05/27/2010 16:42) Added new observation of TB-PPD MFR: Sanofi Pasteur (05/27/2010 16:42) Added new observation of TB-PPD SITE: right forearm (05/27/2010 16:42) Added new observation of TB-PPD: PPD (05/27/2010 16:42)       Immunizations Administered:  PPD Skin Test:    Vaccine Type: PPD    Site: right forearm    Mfr: Sanofi Pasteur    Dose: 0.1 ml    Route: ID    Given by: Wendall Mola CMA ( AAMA)    Exp. Date: 07/09/2011    Lot #: [id number]

## 2010-10-06 NOTE — Assessment & Plan Note (Signed)
Summary: F/U/VS   CC:  f/u labs and .  History of Present Illness: Pt's ear is much improved.  She still feels some fullness and popping but no pain.  She did get a yeast infection on the augmentin and the cream did not help.  She would like to lose weight.  Preventive Screening-Counseling & Management  Alcohol-Tobacco     Alcohol drinks/day: 0     Alcohol type: beer     Smoking Status: never     Year Quit: 05/2006     Pack years: 1/2 per day over 15 years     Passive Smoke Exposure: yes  Caffeine-Diet-Exercise     Caffeine use/day: 1 glass 3 times a week     Does Patient Exercise: no     Type of exercise: walking/interested in buying a stationary bike and step master     Times/week: 3  Hep-HIV-STD-Contraception     HIV Risk: no  Safety-Violence-Falls     Seat Belt Use: yes   Updated Prior Medication List: PAXIL 30 MG  TABS (PAROXETINE HCL) Take 1 tablet by mouth once a day COREG 3.125 MG  TABS (CARVEDILOL) Take 1 tablet by mouth two times a day COMBIVENT 103-18 MCG/ACT  AERO (ALBUTEROL-IPRATROPIUM) as needed PERCOCET 7.5-500 MG  TABS (OXYCODONE-ACETAMINOPHEN) Take 1 tablet by mouth every 6 hours prn AMBIEN 10 MG TABS (ZOLPIDEM TARTRATE) Take 1 tablet by mouth at bedtime prn TRUVADA 200-300 MG TABS (EMTRICITABINE-TENOFOVIR) Take 1 tablet by mouth once a day PREZISTA 400 MG TABS (DARUNAVIR ETHANOLATE) Take 2 tablets by mouth once a day PROMETHAZINE HCL 25 MG TABS (PROMETHAZINE HCL) Take 1 tablet by mouth every 8 hours prn VALTREX 500 MG TABS (VALACYCLOVIR HCL) Take 1 tablet by mouth two times a day NORVIR 100 MG TABS (RITONAVIR) Take 1 tablet by mouth once a day NEURONTIN 400 MG CAPS (GABAPENTIN) Take 1 tablet by mouth three times a day CLARITIN 10 MG TABS (LORATADINE) Take 1 tablet by mouth once a day HYDROXYZINE HCL 25 MG TABS (HYDROXYZINE HCL) Take 1 tablet by mouth every 8 hours as needed FLUCONAZOLE 100 MG TABS (FLUCONAZOLE) Take 1 tablet by mouth once a  day  Current Allergies (reviewed today): ! LIPITOR Past History:  Past Medical History: Last updated: 08/02/2007 Anxiety Depression Hyperlipidemia Hypertension  Additional History Menstrual Status:  perimenopausal  Review of Systems  The patient denies anorexia, fever, chest pain, and dyspnea on exertion.    Vital Signs:  Patient profile:   50 year old female Menstrual status:  perimenopausal Height:      62 inches (157.48 cm) Weight:      257.31 pounds (116.96 kg) BMI:     47.23 Temp:     97.0 degrees F (36.11 degrees C) oral Pulse rate:   71 / minute BP sitting:   130 / 84  (left arm)  Vitals Entered By: Starleen Arms CMA (October 22, 2009 2:44 PM) CC: f/u labs,  Is Patient Diabetic? No Pain Assessment Patient in pain? no      Nutritional Status BMI of > 30 = obese Nutritional Status Detail nl  Have you ever been in a relationship where you felt threatened, hurt or afraid?No   Does patient need assistance? Functional Status Self care Ambulation Normal LMP - Character: heavy     Menstrual Status perimenopausal Last PAP Result NEGATIVE FOR INTRAEPITHELIAL LESIONS OR MALIGNANCY.   Physical Exam  General:  alert, well-nourished, well-hydrated, and overweight-appearing.   Head:  normocephalic and atraumatic.  Ears:  R ear normal and L ear normal.   Mouth:  pharynx pink and moist.   Lungs:  normal breath sounds.          Medication Adherence: 10/22/2009   Adherence to medications reviewed with patient. 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  Pt instructed to continue the current antiretroviral regimen.  Pt encouraged to take medication regularly and not miss doses.  Pt will f/u in 3 months for repeat blood work and will see me 2 weeks later.  Diagnostics  Reviewed:  HIV: REACTIVE (07/26/2007)   HIV-Western blot: Positive (07/26/2007)   CD4: 480 (10/15/2009)   WBC: 3.3 (10/14/2009)   Hgb: 10.4 (10/14/2009)   HCT: 36.2 (10/14/2009)   Platelets: 249 (10/14/2009) HIV genotype: REPORT (10/10/2007)   HIV-1 RNA: 79 (10/14/2009)   HBSAg: NEG (07/26/2007)  Problem # 2:  OTITIS MEDIA, ACUTE (ICD-382.9) resolved  Problem # 3:  CANDIDIASIS, VAGINAL (ICD-112.1)  Her updated medication list for this problem includes:    Fluconazole 100 Mg Tabs (Fluconazole) .Marland Kitchen... Take 1 tablet by mouth once a day  Problem # 4:  OBESITY, UNSPECIFIED (ICD-278.00) discussed diet and exercise  Medications Added to Medication List This Visit: 1)  Fluconazole 100 Mg Tabs (Fluconazole) .... Take 1 tablet by mouth once a day  Other Orders: Est. Patient Level III (81191) Future Orders: T-CD4SP ([facility]) (CD4SP) ... 01/20/2010 T-HIV Viral Load [phone]) ... 01/20/2010 T-Comprehensive Metabolic Panel [id number]) ... 01/20/2010 T-CBC w/Diff ([id number]) ... 01/20/2010  Patient Instructions: 1)  Please schedule a follow-up appointment in 3 months, 2 weeks after labs.  Prescriptions: FLUCONAZOLE 100 MG TABS (FLUCONAZOLE) Take 1 tablet by mouth once a day  #2 x 0   Entered and Authorized by:   Yisroel Ramming MD   Signed by:   Yisroel Ramming MD on 10/22/2009   Method used:   Print then Give to Patient   RxID:   [id number] VALTREX 500 MG TABS (VALACYCLOVIR HCL) Take 1 tablet by mouth two times a day  #14 x 5   Entered and Authorized by:   Yisroel Ramming MD   Signed by:   Yisroel Ramming MD on 10/22/2009   Method used:   Print then Give to Patient   RxID:   [id number] PERCOCET 7.5-500 MG  TABS (OXYCODONE-ACETAMINOPHEN) Take 1 tablet by mouth every 6 hours prn  #60 x 0   Entered and Authorized by:   Yisroel Ramming MD   Signed by:   Yisroel Ramming MD on 10/22/2009   Method used:   Print then Give to Patient   RxID:   [id number]  Process Orders Check  Orders Results:     [facility]: ABN not required for this insurance Tests Sent for requisitioning (October 22, 2009 3:12 PM):     01/20/2010: [facility] -- T-HIV Viral Load [id number] (signed)     01/20/2010: [facility] -- T-Comprehensive Metabolic Panel [[id number]] (signed)     01/20/2010: [facility] -- [facility] w/Diff [[id number]] (signed)    Influenza Immunization History:    Influenza # 1:  Fluvax 3+ (07/08/2009)

## 2010-10-06 NOTE — Progress Notes (Signed)
Summary: Wheezing, coughing, chills and fever x 4 days, to [facility] ED for TX  Phone Note Call from Patient   Caller: Patient Reason for Call: Acute Illness Summary of Call: Respiratory illnes x 4 days, wheezing and coughing, chills and fever.  Pt. stated that Dr. Philipp Deputy recommended that [facility] go to [facility] to recieve "breathing treatments."  RN accompanied to the ED with the pt's friends. Jennet Maduro RN  September 12, 2009 2:02 PM
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
## 2010-10-06 NOTE — Letter (Signed)
Summary: Results Follow-up Letter  [facility]  [address]   [place], [place] [address]   Phone: [phone]  Fax: [phone]            May 08, 2010  [address] [place], [place]  [address]  Dear Ms. Cowher,   The following are the results of your recent test(s):  Test     Result     Pap Smear    Normal_XXX___  Not Normal_____       Comments:  Everything was normal.  I will see you in one year for your next PAP smear.  Thank you for coming to the Center for your care.  Sincerely,    Jennet Maduro [facility]

## 2010-10-06 NOTE — Letter (Signed)
Summary: Discharge Summary  Discharge Summary   Imported By: Florinda Marker 04/13/2010 15:28:48  _____________________________________________________________________  External Attachment:    Type:   Image     Comment:   External Document
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
## 2010-10-06 NOTE — Miscellaneous (Signed)
Summary: [facility]  [facility]   Imported By: Florinda Marker 04/13/2010 15:29:31  _____________________________________________________________________  External Attachment:    Type:   Image     Comment:   External Document

## 2010-10-06 NOTE — Initial Assessments (Signed)
INTERNAL MEDICINE ADMISSION HISTORY AND PHYSICAL  PCP: Dr. Philipp Deputy Cardiologist: Dr Donnie Aho  Attending: Dr. Josem Kaufmann  1st contact Dr Scot Dock  [phone] 2nd contact Dr Cena Benton  [phone] Holidays or 5pm on weekdays:  1st contact [phone] 2nd contact [phone]  CC: CP  HPI: [name] is a 50 year old African American female with history of HIV, coronary artery disease  with MI x3 status post stent x2 (LAD stent in 05 and 08 in LAD)( All of these interventions and catheterizations occurred in at [facility], here only medical therapy was recommended by Dr Donnie Aho) ischemic cardiomyopathy, early COPD, ex- smoker who presents to the ED complaining of 6/10 intermittent sharp R sided reproducible CP without radiation.  PT relates she has relapsed on crack/cocaine and has been binging the past two days. Last relapse was in January and she stopped herself. 2/24 at about 4:30 pm she turned to reach for something and developed right sided chest pain she said that this pain is different that her previous CP, Pain is aggravated by movement and palpation, relived by oxycodone. Pt states she is only taking her free HIV meds, and states she can't afford any of her other medications.  At the time of our evaluation at 2 am she does not have left arm discomfort or severe CP. Denies any nausea, diaphoresis or shortness of breath.  She does state that these symptoms are exactly the same as the way her previous coronary events have presented.    ALLERGIES: ! LIPITOR  PAST MEDICAL HISTORY: Anxiety Depression Hyperlipidemia Hypertension   MEDICATIONS: PAXIL 30 MG  TABS (PAROXETINE HCL) Take 1 tablet by mouth once a day COREG 3.125 MG  TABS (CARVEDILOL) Take 1 tablet by mouth two times a day COMBIVENT 103-18 MCG/ACT  AERO (ALBUTEROL-IPRATROPIUM) as needed PERCOCET 7.5-500 MG  TABS (OXYCODONE-ACETAMINOPHEN) Take 1 tablet by mouth every 6 hours prn AMBIEN 10 MG TABS (ZOLPIDEM TARTRATE) Take 1 tablet by mouth at  bedtime prn TRUVADA 200-300 MG TABS (EMTRICITABINE-TENOFOVIR) Take 1 tablet by mouth once a day PREZISTA 400 MG TABS (DARUNAVIR ETHANOLATE) Take 2 tablets by mouth once a day PROMETHAZINE HCL 25 MG TABS (PROMETHAZINE HCL) Take 1 tablet by mouth every 8 hours prn VALTREX 500 MG TABS (VALACYCLOVIR HCL) Take 1 tablet by mouth two times a day NORVIR 100 MG TABS (RITONAVIR) Take 1 tablet by mouth once a day NEURONTIN 400 MG CAPS (GABAPENTIN) Take 1 tablet by mouth three times a day CLARITIN 10 MG TABS (LORATADINE) Take 1 tablet by mouth once a day HYDROXYZINE HCL 25 MG TABS (HYDROXYZINE HCL) Take 1 tablet by mouth every 8 hours as needed   SOCIAL HISTORY: Divorced Alcohol use-yes Drug use-yes (crack and pot) Smoking-Yes   FAMILY HISTORY: non contributary.   ROS: As per HPI, all other systems reviewed and are negative.  VITALS: T: 98.3  P: 89  BP: 139/88  R: 18  O2SAT: 100%  ON: RA  PHYSICAL EXAM: General:  Intoxicated, obese, performing inappropriate contact with herself. Head:  normocephalic and atraumatic.   Eyes:  vision grossly intact, pupils equal, pupils round, pupils reactive to light, no injection and anicteric.   Mouth:  pharynx pink and moist, no erythema, and no exudates.   Neck:  supple, full ROM, no thyromegaly, no JVD, and no carotid bruits.   Lungs:  normal respiratory effort, no accessory muscle use, Reduced breath sounds, no crackles, and no wheezes. Heart and chest: severe TTP on R side of chest otherwise, normal rate, regular rhythm, no  murmur, no gallop, and no rub.   Abdomen:  soft, non-tender, normal bowel sounds, no distention, no guarding, no rebound tenderness, no hepatomegaly, and no splenomegaly.   Msk:  no joint swelling, no joint warmth, and no redness over joints.   Pulses:  2+ DP/PT pulses bilaterally Extremities:  No cyanosis, clubbing, edema Neurologic:  alert & oriented X3, cranial nerves II-XII intact, strength normal in all extremities, sensation  intact to light touch, and gait normal.   Skin:  turgor normal and no rashes.   Psych:  Oriented X3, memory intact for recent and remote, normally interactive, good eye contact, not anxious appearing, and not depressed appearing.   LABS:   WBC                                      10.5              4.0-10.5         K/uL  RBC                                      4.48              3.87-5.11        MIL/uL  Hemoglobin (HGB)                         10.8       l      12.0-15.0        g/dL  Hematocrit (HCT)                         33.6       l      36.0-46.0        %  MCV                                      75.0       l      78.0-100.0       fL  MCHC                                     32.2              30.0-36.0        g/dL  RDW                                      17.5       h      11.5-15.5        %  Platelet Count (PLT)                     268               150-400          K/uL  Neutrophils, %  68                43-77            %  Lymphocytes, %                           25                12-46            %  Monocytes, %                             7                 3-12             %  Eosinophils, %                           1                 0-5              %  Basophils, %                             1                 0-1              %  Neutrophils, Absolute                    7.1               1.7-7.7          K/uL  Lymphocytes, Absolute                    2.6               0.7-4.0          K/uL  Monocytes, Absolute                      0.7               0.1-1.0          K/uL  Eosinophils, Absolute                    0.1               0.0-0.7          K/uL  Basophils, Absolute                      0.1               0.0-0.1          K/uL   TCO2                                     27                0-100            mmol/L  Ionized Calcium  1.10       l      1.12-1.32        mmol/L  Hemoglobin (HGB)                         12.6               12.0-15.0        g/dL  Hematocrit (HCT)                         37.0              36.0-46.0        %  Sodium (NA)                              138               135-145          mEq/L  Potassium (K)                            3.2        l      3.5-5.1          mEq/L  Chloride                                 105               96-112           mEq/L  Glucose                                  80                70-99            mg/dL  BUN                                      15                6-23             mg/dL  Creatinine                               1.1               0.4-1.2          mg/dL    Alcohol                                  <5                0-10             mg/dL    Amphetamins                              SEE NOTE.         NDT  NONE DETECTED   Barbiturates                             SEE NOTE.         NDT    Oversized comment, see footnote  1   Benzodiazepines                          SEE NOTE.         NDT    NONE DETECTED  Cocaine                   POSITIVE   a      NDT   Opiates                                  SEE NOTE.         NDT    NONE DETECTED  Tetrahydrocannabinol      POSITIVE   a      NDT   CKMB, POC                                6.8               1.0-8.0          ng/mL  Troponin I, POC                          <0.05             0.00-0.09        ng/mL  Myoglobin, POC                           288        H      12-200           ng/mL   CKMB, POC                                8.3        H      1.0-8.0          ng/mL  Troponin I, POC                          <0.05             0.00-0.09        ng/mL  Myoglobin, POC                           359        H      12-200           ng/mL      CXR IMPRESSION:   No active disease.   ECG: No acute changes from prior ECG readings (chronic t wave inversions)  ASSESSMENT AND PLAN:  Chest Pain: Differentials include Cocaine induced CP vs MSK pain, other less likely causes include: Angina/ACS/PE/PNA/ MSK  Pain/GERD/PUD/ Panic attack/anxiety and even less likely are PTX and Dissection. EKG showed: no acute abnormalities with no signs of acute MI,  no q waves detected, however some t wave inversion were again noted on anterior leads, this is not a new finding.  Plan: -Will admit to TELE -Cycle CE x3 if Positive, consider anticoagulation vs catheterisation. -12 lead EKG in AM. -Will restart plavix, nitroglycerine,  -Voltaren gen for pain, and flexiril as needed for MSK pain.  HIV: last CD4 was: 480(10/2009) last viral load: 78 (10/2009)  Followed by ID. Will continue on ART, patient is stable therefore there is no need to recheck viral load and CD4 count.  HYPERLIPIDEMIA: Last FLP showed: T:207, HDL 54, LDL 141.  Will recheck Lipid panel and liver function tests.  If LDL remains >100 may consider increasing crestor as the patient has cardiac risk factors.   ESSENTIAL HYPERTENSION: controlled. Patient is on coreg at home. Will hold (as she is cocaine +). Will start isosorbide mononitrate while here in hospital. On d/c consider stopping BB in such a patient as she is likely to abuse cocaine again.   Anxiety/Depression: Patient denies suicidal ideation, Will contine home meds.  Anemia: Hg chronically around 10, today is 10.5 this is either iron def vs anemia of chronic disease. MCV is microcytic, currently there are no signs of active bleeding, will monitor and check anemia panel.     VTE Proph: Heparin  ATTENDING: I performed and/or observed a history and physical examination of the patient.  I discussed the case with the residents as noted and reviewed the residents' notes.  I agree with the findings and plan--please refer to the attending physician note for more details.  Signature________________________________  Printed Name_____________________________

## 2010-10-06 NOTE — Miscellaneous (Signed)
Summary: problem list update  Clinical Lists Changes  Problems: Added new problem of GARDNERELLA VAGINALIS (ICD-616.10) Added new problem of VAGINITIS, CANDIDAL (ICD-112.1)

## 2010-10-06 NOTE — Miscellaneous (Signed)
Summary: Orders Update  Clinical Lists Changes  Observations: Added new observation of TB PPDRESULT: negative (06/04/2010 15:59) Added new observation of PPD RESULT: < 5mm (06/04/2010 15:59) Added new observation of TB-PPD RDDTE: 05/29/2010 (06/04/2010 15:59)      PPD Results    Date of reading: 05/29/2010    Results: < 5mm    Interpretation: negative

## 2010-10-06 NOTE — Progress Notes (Signed)
Summary: c/o yeast infec  Phone Note Call from Patient   Caller: Patient Reason for Call: Acute Illness Summary of Call: Since being on Flagyl pt. feels she has developed a yeast infection.  And request RX sent to [facility] on [address] Initial call taken by: Wendall Mola CMA Duncan Dull),  June 04, 2010 12:08 PM  Follow-up for Phone Call        fluconazole 150mg  by mouth once daily x 2 Follow-up by: Yisroel Ramming MD,  June 04, 2010 2:45 PM    New/Updated Medications: FLUCONAZOLE 150 MG TABS (FLUCONAZOLE) Take 1 tablet by mouth once a day Prescriptions: FLUCONAZOLE 150 MG TABS (FLUCONAZOLE) Take 1 tablet by mouth once a day  #2 x 0   Entered by:   Wendall Mola CMA ( AAMA)   Authorized by:   Yisroel Ramming MD   Signed by:   Wendall Mola CMA ( AAMA) on 06/04/2010   Method used:   Electronically to        [facility] (retail)       [address]       [place], [place]  [id number]       Ph: [phone]       Fax: [phone]   RxID:   [id number]

## 2010-10-06 NOTE — Progress Notes (Signed)
Summary: phone note-ty  Phone Note Call from Patient   Reason for Call: Acute Illness Summary of Call: Patient has a possible yeast infection from the abx prescribed for the ear infection. She stated that she couldnt take diflucan with the medications that she is on,  she wants to know if she can  take anything else for a yeast infection. Initial call taken by: Starleen Arms CMA,  September 23, 2009 10:21 AM  Follow-up for Phone Call        clotrimazole vaginal cream for 7 days Follow-up by: Yisroel Ramming MD,  September 23, 2009 2:52 PM  Additional Follow-up for Phone Call Additional follow up Details #1::        ok Additional Follow-up by: Starleen Arms CMA,  September 23, 2009 2:59 PM    New/Updated Medications: CLOTRIMAZOLE-7 1 % CREA (CLOTRIMAZOLE) use as directed for 7 days Prescriptions: CLOTRIMAZOLE-7 1 % CREA (CLOTRIMAZOLE) use as directed for 7 days  #1 x 0   Entered by:   Starleen Arms CMA   Authorized by:   Yisroel Ramming MD   Signed by:   Starleen Arms CMA on 09/23/2009   Method used:   Electronically to        [facility] (retail)       [address]       [place], [place]  [id number]       Ph: [phone]       Fax: [phone]   RxID:   [id number]

## 2010-10-06 NOTE — Progress Notes (Signed)
Summary: PPD  Phone Note Outgoing Call   Call placed by: Annice Pih Summary of Call: Pt. needs PPD at next office visit Initial call taken by: Wendall Mola CMA Duncan Dull),  May 26, 2010 4:55 PM

## 2010-10-06 NOTE — Assessment & Plan Note (Signed)
Summary: cold sxs, ear pain, fever-ty   CC:  ear pain, unable to hear well, cough, congestion, and ear throbbing.  History of Present Illness: Pt c/o cough for about a week.  She went to ED last week and was given a z-pack, 2 med nebs, prednisone and albuterol to use at home.  The cough is a little better but she has since devloped severe ear pain and decreased hearing. She also c/o low grade fever.  Preventive Screening-Counseling & Management  Alcohol-Tobacco     Alcohol drinks/day: 0     Alcohol type: beer     Smoking Status: never     Year Quit: 05/2006     Pack years: 1/2 per day over 15 years     Passive Smoke Exposure: yes  Caffeine-Diet-Exercise     Caffeine use/day: 1 glass 3 times a week     Does Patient Exercise: no     Type of exercise: walking/interested in buying a stationary bike and step master     Times/week: 3   Updated Prior Medication List: PAXIL 30 MG  TABS (PAROXETINE HCL) Take 1 tablet by mouth once a day COREG 3.125 MG  TABS (CARVEDILOL) Take 1 tablet by mouth two times a day PLAVIX 75 MG  TABS (CLOPIDOGREL BISULFATE) Take 1 tablet by mouth once a day COMBIVENT 103-18 MCG/ACT  AERO (ALBUTEROL-IPRATROPIUM) as needed PERCOCET 7.5-500 MG  TABS (OXYCODONE-ACETAMINOPHEN) Take 1 tablet by mouth every 6 hours prn AMBIEN 10 MG TABS (ZOLPIDEM TARTRATE) Take 1 tablet by mouth at bedtime prn TRUVADA 200-300 MG TABS (EMTRICITABINE-TENOFOVIR) Take 1 tablet by mouth once a day PREZISTA 400 MG TABS (DARUNAVIR ETHANOLATE) Take 2 tablets by mouth once a day PROMETHAZINE HCL 25 MG TABS (PROMETHAZINE HCL) Take 1 tablet by mouth every 8 hours prn VALTREX 500 MG TABS (VALACYCLOVIR HCL) Take 1 tablet by mouth two times a day TESSALON PERLES 100 MG CAPS (BENZONATATE) Take 1 capsule by mouth every 8 hours as needed NORVIR 100 MG TABS (RITONAVIR) Take 1 tablet by mouth once a day NEURONTIN 400 MG CAPS (GABAPENTIN) Take 1 tablet by mouth three times a day CLARITIN 10 MG TABS  (LORATADINE) Take 1 tablet by mouth once a day HYDROXYZINE HCL 25 MG TABS (HYDROXYZINE HCL) Take 1 tablet by mouth every 8 hours as needed AUGMENTIN 875-125 MG TABS (AMOXICILLIN-POT CLAVULANATE) Take 1 tablet by mouth two times a day  Current Allergies (reviewed today): ! LIPITOR Past History:  Past Medical History: Last updated: 08/02/2007 Anxiety Depression Hyperlipidemia Hypertension  Review of Systems       The patient complains of fever, decreased hearing, prolonged cough, and headaches.  The patient denies hemoptysis.    Vital Signs:  Patient profile:   50 year old female Menstrual status:  irregular Height:      62 inches (157.48 cm) Weight:      257 pounds (116.82 kg) BMI:     47.18 Temp:     99.0 degrees F (37.22 degrees C) oral Pulse rate:   104 / minute BP sitting:   128 / 85  (left arm)  Vitals Entered By: Starleen Arms CMA (September 19, 2009 2:18 PM) CC: ear pain, unable to hear well, cough, congestion, ear throbbing Is Patient Diabetic? No Pain Assessment Patient in pain? yes     Location: ears Intensity: 10 Type: aching Nutritional Status BMI of > 30 = obese Nutritional Status Detail decreased  Does patient need assistance? Functional Status Self care Ambulation Normal  Physical Exam  General:  alert, well-developed, well-nourished, and well-hydrated.   Head:  normocephalic and atraumatic.   Ears:  both TMs erythematous - left bulging Lungs:  few scattered exp. wheezes   Impression & Recommendations:  Problem # 1:  OTITIS MEDIA, ACUTE (ICD-382.9) will treat with augmentin and percocet for pain if no improvement in 72 hours patient to call for possible referral to ENT Her updated medication list for this problem includes:    Augmentin 875-125 Mg Tabs (Amoxicillin-pot clavulanate) .Marland Kitchen... Take 1 tablet by mouth two times a day  Orders: Est. Patient Level III (56387)  Medications Added to Medication List This Visit: 1)  Augmentin  875-125 Mg Tabs (Amoxicillin-pot clavulanate) .... Take 1 tablet by mouth two times a day Prescriptions: AUGMENTIN 875-125 MG TABS (AMOXICILLIN-POT CLAVULANATE) Take 1 tablet by mouth two times a day  #20 x 0   Entered and Authorized by:   Yisroel Ramming MD   Signed by:   Yisroel Ramming MD on 09/19/2009   Method used:   Print then Give to Patient   RxID:   [id number] PERCOCET 7.5-500 MG  TABS (OXYCODONE-ACETAMINOPHEN) Take 1 tablet by mouth every 6 hours prn  #60 x 0   Entered and Authorized by:   Yisroel Ramming MD   Signed by:   Yisroel Ramming MD on 09/19/2009   Method used:   Print then Give to Patient   RxID:   [id number]
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
## 2010-10-15 ENCOUNTER — Ambulatory Visit (INDEPENDENT_AMBULATORY_CARE_PROVIDER_SITE_OTHER): Payer: Medicare PPO | Admitting: Adult Health

## 2010-10-15 ENCOUNTER — Encounter: Payer: Self-pay | Admitting: Adult Health

## 2010-10-15 DIAGNOSIS — J449 Chronic obstructive pulmonary disease, unspecified: Secondary | ICD-10-CM | POA: Insufficient documentation

## 2010-10-15 DIAGNOSIS — B20 Human immunodeficiency virus [HIV] disease: Secondary | ICD-10-CM

## 2010-10-19 ENCOUNTER — Telehealth: Payer: Self-pay | Admitting: Adult Health

## 2010-10-21 ENCOUNTER — Ambulatory Visit (HOSPITAL_COMMUNITY): Payer: Medicare PPO | Attending: Infectious Diseases

## 2010-10-28 NOTE — Progress Notes (Signed)
  Phone Note From Pharmacy   Caller: [facility] Call For: provider  Summary of Call: Delice Bison, the pharmacist wanted to clarify the dose of mobic. spoke with B. Sundra Aland, NP. dose is 1 tab q12h as needed.Golden Circle RN  October 19, 2010 11:22 AM\par Initial call taken by: Golden Circle RN,  October 19, 2010 11:21 AM

## 2010-11-12 NOTE — Assessment & Plan Note (Signed)
Summary: f/u/ov/vs   Vital Signs:  Patient profile:   50 year old female Menstrual status:  perimenopausal Height:      62 inches Weight:      236 pounds BMI:     43.32 Temp:     98.5 degrees F oral Pulse rate:   71 / minute BP sitting:   133 / 86  (left arm)  Vitals Entered By: Alesia Morin CMA (October 15, 2010 3:25 PM) CC: follow-up visit for labs and she needs refills on medications Is Patient Diabetic? No Pain Assessment Patient in pain? yes     Location: knees Intensity: 10 Type: aching Nutritional Status BMI of > 30 = obese Nutritional Status Detail appetite "so so"  Have you ever been in a relationship where you felt threatened, hurt or afraid?No   Does patient need assistance? Functional Status Self care Ambulation Normal Comments no missed doses   CC:  follow-up visit for labs and she needs refills on medications.  History of Present Illness: In for follow-up.  Has c/o ongoing recurrent asthmatic exacerbations using combivent inhaler twice daily.  Not currently taking inhaled steroids.  Also ongoing chronic pain in legs and hips.  Hx of osteoarthritis.  Has historically been prescribed percocets 10/325 by previous provider and requesting a refill.  Allergies: 1)  ! Lipitor  Past History:  Past medical, surgical, family and social histories (including risk factors) reviewed for relevance to current acute and chronic problems.  Past Medical History: Reviewed history from 08/02/2007 and no changes required. Anxiety Depression Hyperlipidemia Hypertension  Family History: Reviewed history and no changes required.  Social History: Reviewed history from 08/02/2007 and no changes required. Divorced Alcohol use-yes Drug use-yes  Physical Exam  General:  Morbidly obesealert, well-developed, well-nourished, and well-hydrated.   Head:  normocephalic and atraumatic.   Eyes:  vision grossly intact, pupils equal, pupils round, and pupils reactive to  light.   Ears:  R ear normal and L ear normal.   Nose:  no external erythema.   Mouth:  good dentition, pharynx pink and moist, and postnasal drip.   Neck:  supple, full ROM, and no masses.   Lungs:  normal respiratory effort, R wheezes, and L wheezes.   Heart:  normal rate, regular rhythm, no murmur, no gallop, no rub, and no JVD.   Abdomen:  Obese. soft, non-tender, and normal bowel sounds.   Msk:  decreased ROM.   Pulses:  R and L carotid,radial,femoral,dorsalis pedis and posterior tibial pulses are full and equal bilaterally Extremities:  No clubbing, cyanosis, edema, or deformity noted with normal full range of motion of all joints.   Neurologic:  alert & oriented X3, cranial nerves II-XII intact, strength normal in all extremities, and gait normal.   Skin:  Intact without suspicious lesions or rashes Psych:  Oriented X3, memory intact for recent and remote, normally interactive, good eye contact, not anxious appearing, and not depressed appearing.     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  Clinically stable. CPM.  Recheck labs in 10 weeks and f/u in 3 months The following medications were removed from the medication list:    Flagyl 500 Mg Tabs (Metronidazole) .Marland Kitchen... Take 1 tablet by mouth two times a day x 7 days    Avelox 400 Mg Tabs (Moxifloxacin hcl) .Marland Kitchen... 1 tablet by mouth once daily x 7 days Her updated medication list for this problem  includes:    Valtrex 500 Mg Tabs (Valacyclovir hcl) .Marland Kitchen... Take 1 tablet by mouth two times a day  Orders: Est. Patient Level IV (99214)Future Orders: T-CBC w/Diff ([id number]) ... 12/24/2010 T-CD4SP ([facility]) (CD4SP) ... 12/24/2010 T-Comprehensive Metabolic Panel [phone]) ... 12/24/2010 T-HIV Viral Load [phone]) ... 12/24/2010  Problem # 2:  COPD (ICD-496) Continue combivent, start QVAR 1 puff two times a day, obtain baseline PFT's. Her  updated medication list for this problem includes:    Combivent 103-18 Mcg/act Aero (Albuterol-ipratropium) .Marland Kitchen... As needed    Qvar 40 Mcg/act Aers (Beclomethasone dipropionate) .Marland Kitchen... 1 puff as directed two times a day for next two weeks  Orders: Est. Patient Level IV (29528) PFT Baseline-Pre/Post Bronchodiolator (PFT Baseline-Pre/Pos)  Problem # 3:  HSV (ICD-054.9) Chroinic recurrent, adequately suppressed with Valtrex.  Will renew Valtrex for now and continue to monitor.  Problem # 4:  OTHER CHRONIC PAIN - MUSCULOSKELETAL (ICD-338.29) Most aparent underlying cause is her obesity.  However, we will address this and chronic pain nmanagement after we assess pulmonary function status.  It will eventually be necessary to refer to ortho and pain management in the near future for addressing these issues.  For now we will refill her percocets with the understanding by her  that further issues with pain management may require outside referral.  Verbally acknowledged this.  Medications Added to Medication List This Visit: 1)  Mobic 7.5 Mg Tabs (Meloxicam) .Marland Kitchen.. 1 tab every 4-6 hours as needed for pain  Other Orders: Future Orders: T-Lipid Profile ([id number]) ... 12/24/2010 Prescriptions: MOBIC 7.5 MG TABS (MELOXICAM) 1 tab every 4-6 hours as needed for pain  #50 x 3   Entered and Authorized by:   Talmadge Chad NP   Signed by:   Talmadge Chad NP on 10/15/2010   Method used:   Print then Give to Patient   RxID:   [id number] PERCOCET 10-325 MG TABS (OXYCODONE-ACETAMINOPHEN) Take 1 tablet by mouth every 8 hours as needed  #60 x 0   Entered and Authorized by:   Talmadge Chad NP   Signed by:   Talmadge Chad NP on 10/15/2010   Method used:   Print then Give to Patient   RxID:   [id number] VALTREX 500 MG TABS (VALACYCLOVIR HCL) Take 1 tablet by mouth two times a day  #14 x 5   Entered and Authorized by:   Talmadge Chad NP   Signed by:   Talmadge Chad  NP on 10/15/2010   Method used:   Print then Give to Patient   RxID:   [id number] PROMETHAZINE HCL 25 MG TABS (PROMETHAZINE HCL) Take 1 tablet by mouth every 8 hours prn  #30 x 3   Entered and Authorized by:   Talmadge Chad NP   Signed by:   Talmadge Chad NP on 10/15/2010   Method used:   Print then Give to Patient   RxID:   [id number]    Orders Added: 1)  T-CBC w/Diff [[id number]] 2)  T-CD4SP [facility]) [CD4SP] 3)  T-Comprehensive Metabolic Panel [[id number]] 4)  T-HIV Viral Load [phone] 5)  T-Lipid Profile [[id number]] 6)  Est. Patient Level IV [03500] 7)  PFT Baseline-Pre/Post Bronchodiolator [PFT Baseline-Pre/Pos]      Appended Document: f/u/ov/vs Also added Mobic 7.5 mg two times a day for her MS pain.

## 2010-11-18 ENCOUNTER — Encounter (HOSPITAL_COMMUNITY): Payer: Medicare PPO

## 2010-11-19 ENCOUNTER — Telehealth: Payer: Self-pay | Admitting: Licensed Clinical Social Worker

## 2010-11-19 LAB — T-HELPER CELL (CD4) - (RCID CLINIC ONLY)
CD4 % Helper T Cell: 33 % (ref 33–55)
CD4 T Cell Abs: 410 uL (ref 400–2700)

## 2010-11-22 LAB — URINALYSIS, ROUTINE W REFLEX MICROSCOPIC
Bilirubin Urine: NEGATIVE
Glucose, UA: NEGATIVE mg/dL
Nitrite: NEGATIVE
Specific Gravity, Urine: 1.014 (ref 1.005–1.030)
pH: 6 (ref 5.0–8.0)

## 2010-11-22 LAB — RAPID URINE DRUG SCREEN, HOSP PERFORMED
Cocaine: POSITIVE — AB
Opiates: NOT DETECTED
Tetrahydrocannabinol: POSITIVE — AB

## 2010-11-22 LAB — CBC
HCT: 35.2 % — ABNORMAL LOW (ref 36.0–46.0)
Hemoglobin: 11.1 g/dL — ABNORMAL LOW (ref 12.0–15.0)
MCHC: 31.9 g/dL (ref 30.0–36.0)
MCV: 74.6 fL — ABNORMAL LOW (ref 78.0–100.0)
Platelets: 203 10*3/uL (ref 150–400)
RBC: 4.31 MIL/uL (ref 3.87–5.11)
RBC: 4.67 MIL/uL (ref 3.87–5.11)

## 2010-11-22 LAB — DIFFERENTIAL
Basophils Absolute: 0 10*3/uL (ref 0.0–0.1)
Basophils Relative: 0 % (ref 0–1)
Eosinophils Absolute: 0.3 10*3/uL (ref 0.0–0.7)
Lymphs Abs: 1.9 10*3/uL (ref 0.7–4.0)
Monocytes Absolute: 0.7 10*3/uL (ref 0.1–1.0)
Monocytes Relative: 8 % (ref 3–12)
Monocytes Relative: 8 % (ref 3–12)
Neutro Abs: 3.3 10*3/uL (ref 1.7–7.7)
Neutro Abs: 6.4 10*3/uL (ref 1.7–7.7)
Neutrophils Relative %: 68 % (ref 43–77)
Neutrophils Relative %: 71 % (ref 43–77)

## 2010-11-22 LAB — POCT I-STAT 3, ART BLOOD GAS (G3+)
O2 Saturation: 98 %
pCO2 arterial: 27.1 mmHg — ABNORMAL LOW (ref 35.0–45.0)
pH, Arterial: 7.533 — ABNORMAL HIGH (ref 7.350–7.400)
pO2, Arterial: 95 mmHg (ref 80.0–100.0)

## 2010-11-22 LAB — POCT I-STAT, CHEM 8
HCT: 34 % — ABNORMAL LOW (ref 36.0–46.0)
Hemoglobin: 11.6 g/dL — ABNORMAL LOW (ref 12.0–15.0)
Potassium: 3 mEq/L — ABNORMAL LOW (ref 3.5–5.1)
Sodium: 140 mEq/L (ref 135–145)
TCO2: 27 mmol/L (ref 0–100)

## 2010-11-22 LAB — BASIC METABOLIC PANEL
CO2: 27 mEq/L (ref 19–32)
Calcium: 9.6 mg/dL (ref 8.4–10.5)
GFR calc Af Amer: >60 mL/min (ref >60)
GFR calc non Af Amer: >60 mL/min (ref >60)
Potassium: 3.4 mEq/L — ABNORMAL LOW (ref 3.5–5.1)
Sodium: 140 mEq/L (ref 135–145)

## 2010-11-22 LAB — BRAIN NATRIURETIC PEPTIDE: Pro B Natriuretic peptide (BNP): 105 pg/mL — ABNORMAL HIGH (ref 0.0–100.0)

## 2010-11-23 LAB — COMPREHENSIVE METABOLIC PANEL
Albumin: 3.6 g/dL (ref 3.5–5.2)
Alkaline Phosphatase: 54 U/L (ref 39–117)
BUN: 11 mg/dL (ref 6–23)
CO2: 27 mEq/L (ref 19–32)
Chloride: 108 mEq/L (ref 96–112)
GFR calc non Af Amer: >60 mL/min (ref >60)
Glucose, Bld: 103 mg/dL — ABNORMAL HIGH (ref 70–99)
Potassium: 2.8 mEq/L — ABNORMAL LOW (ref 3.5–5.1)
Total Bilirubin: 0.4 mg/dL (ref 0.3–1.2)

## 2010-11-23 LAB — DIFFERENTIAL
Basophils Absolute: 0 10*3/uL (ref 0.0–0.1)
Basophils Relative: 0 % (ref 0–1)
Monocytes Absolute: 0.6 10*3/uL (ref 0.1–1.0)
Neutro Abs: 4.9 10*3/uL (ref 1.7–7.7)
Neutrophils Relative %: 65 % (ref 43–77)

## 2010-11-23 LAB — PROTIME-INR: INR: 1 (ref 0.00–1.49)

## 2010-11-23 LAB — CBC
HCT: 32.6 % — ABNORMAL LOW (ref 36.0–46.0)
Hemoglobin: 10.7 g/dL — ABNORMAL LOW (ref 12.0–15.0)
RBC: 4.32 MIL/uL (ref 3.87–5.11)
WBC: 7.6 10*3/uL (ref 4.0–10.5)

## 2010-11-23 LAB — HEMOCCULT GUIAC POC 1CARD (OFFICE): Fecal Occult Bld: POSITIVE

## 2010-11-23 LAB — T-HELPER CELL (CD4) - (RCID CLINIC ONLY): CD4 % Helper T Cell: 30 % — ABNORMAL LOW (ref 33–55)

## 2010-11-24 NOTE — Progress Notes (Signed)
  Phone Note Call from Patient   Caller: Patient Summary of Call: pt called to get refill on truvada. per notes it was done. states she gets it mailed to her & she needs the prezista. says she only has a few days left. I called [facility] [phone] & reauthorized refills on all 3 hiv meds with 6 refills.  they will not ship until the 19th. she will be out by saturday . sample in cabinet for her to pick up Initial call taken by: Starleen Arms CMA,  November 19, 2010 3:58 PM

## 2010-11-25 LAB — T-HELPER CELL (CD4) - (RCID CLINIC ONLY): CD4 T Cell Abs: 480 uL (ref 400–2700)

## 2010-11-29 LAB — COMPREHENSIVE METABOLIC PANEL
AST: 27 U/L (ref 0–37)
Albumin: 3.5 g/dL (ref 3.5–5.2)
Alkaline Phosphatase: 59 U/L (ref 39–117)
BUN: 15 mg/dL (ref 6–23)
CO2: 29 mEq/L (ref 19–32)
Chloride: 103 mEq/L (ref 96–112)
Creatinine, Ser: 1.16 mg/dL (ref 0.4–1.2)
GFR calc Af Amer: >60 mL/min (ref >60)
GFR calc non Af Amer: 50 mL/min — ABNORMAL LOW (ref >60)
Potassium: 3.4 mEq/L — ABNORMAL LOW (ref 3.5–5.1)
Total Bilirubin: 0.4 mg/dL (ref 0.3–1.2)

## 2010-11-29 LAB — POCT I-STAT, CHEM 8
BUN: 15 mg/dL (ref 6–23)
Chloride: 105 mEq/L (ref 96–112)
Creatinine, Ser: 1.1 mg/dL (ref 0.4–1.2)
Potassium: 3.2 mEq/L — ABNORMAL LOW (ref 3.5–5.1)
Sodium: 138 mEq/L (ref 135–145)

## 2010-11-29 LAB — DIFFERENTIAL
Lymphocytes Relative: 25 % (ref 12–46)
Lymphs Abs: 2.6 10*3/uL (ref 0.7–4.0)
Neutrophils Relative %: 68 % (ref 43–77)

## 2010-11-29 LAB — POCT CARDIAC MARKERS
CKMB, poc: 6.3 ng/mL (ref 1.0–8.0)
CKMB, poc: 6.8 ng/mL (ref 1.0–8.0)
CKMB, poc: 8.3 ng/mL (ref 1.0–8.0)
Myoglobin, poc: 288 ng/mL (ref 12–200)
Myoglobin, poc: 379 ng/mL (ref 12–200)
Troponin i, poc: <0.05 ng/mL (ref 0.00–0.09)

## 2010-11-29 LAB — CARDIAC PANEL(CRET KIN+CKTOT+MB+TROPI)
CK, MB: 6.9 ng/mL (ref 0.3–4.0)
Relative Index: 1 (ref 0.0–2.5)
Total CK: 659 U/L — ABNORMAL HIGH (ref 7–177)

## 2010-11-29 LAB — RAPID URINE DRUG SCREEN, HOSP PERFORMED
Barbiturates: NOT DETECTED
Opiates: NOT DETECTED

## 2010-11-29 LAB — CBC
HCT: 33.6 % — ABNORMAL LOW (ref 36.0–46.0)
Platelets: 268 10*3/uL (ref 150–400)
WBC: 10.5 10*3/uL (ref 4.0–10.5)

## 2010-11-29 LAB — GLUCOSE, CAPILLARY

## 2010-11-29 LAB — ETHANOL: Alcohol, Ethyl (B): <5 mg/dL (ref 0–10)

## 2010-12-11 LAB — TSH: TSH: 1.472 u[IU]/mL (ref 0.350–4.500)

## 2010-12-11 LAB — CBC
HCT: 27.1 % — ABNORMAL LOW (ref 36.0–46.0)
HCT: 28.1 % — ABNORMAL LOW (ref 36.0–46.0)
HCT: 29.8 % — ABNORMAL LOW (ref 36.0–46.0)
Hemoglobin: 8.6 g/dL — ABNORMAL LOW (ref 12.0–15.0)
Hemoglobin: 9 g/dL — ABNORMAL LOW (ref 12.0–15.0)
MCHC: 31.9 g/dL (ref 30.0–36.0)
MCHC: 31.9 g/dL (ref 30.0–36.0)
MCV: 76.8 fL — ABNORMAL LOW (ref 78.0–100.0)
MCV: 77.2 fL — ABNORMAL LOW (ref 78.0–100.0)
Platelets: 226 10*3/uL (ref 150–400)
Platelets: 272 10*3/uL (ref 150–400)
RDW: 16.5 % — ABNORMAL HIGH (ref 11.5–15.5)
RDW: 16.5 % — ABNORMAL HIGH (ref 11.5–15.5)
RDW: 16.7 % — ABNORMAL HIGH (ref 11.5–15.5)

## 2010-12-11 LAB — COMPREHENSIVE METABOLIC PANEL
BUN: 9 mg/dL (ref 6–23)
Calcium: 8.9 mg/dL (ref 8.4–10.5)
Glucose, Bld: 99 mg/dL (ref 70–99)
Total Protein: 6.7 g/dL (ref 6.0–8.3)

## 2010-12-11 LAB — DRUGS OF ABUSE SCREEN W/O ALC, ROUTINE URINE
Amphetamine Screen, Ur: NEGATIVE
Barbiturate Quant, Ur: NEGATIVE
Benzodiazepines.: NEGATIVE
Cocaine Metabolites: NEGATIVE
Phencyclidine (PCP): NEGATIVE
Propoxyphene: NEGATIVE

## 2010-12-11 LAB — CARDIAC PANEL(CRET KIN+CKTOT+MB+TROPI)
CK, MB: 1.1 ng/mL (ref 0.3–4.0)
CK, MB: 1.2 ng/mL (ref 0.3–4.0)
Relative Index: INVALID (ref 0.0–2.5)
Relative Index: INVALID (ref 0.0–2.5)
Total CK: 92 U/L (ref 7–177)
Troponin I: 0.01 ng/mL (ref 0.00–0.06)
Troponin I: <0.01 ng/mL (ref 0.00–0.06)

## 2010-12-11 LAB — DIFFERENTIAL
Basophils Absolute: 0 10*3/uL (ref 0.0–0.1)
Basophils Relative: 0 % (ref 0–1)
Eosinophils Absolute: 0.2 10*3/uL (ref 0.0–0.7)
Eosinophils Relative: 2 % (ref 0–5)
Lymphs Abs: 1.4 10*3/uL (ref 0.7–4.0)
Neutrophils Relative %: 68 % (ref 43–77)

## 2010-12-11 LAB — CK TOTAL AND CKMB (NOT AT ARMC): Total CK: 117 U/L (ref 7–177)

## 2010-12-11 LAB — LIPID PANEL
Cholesterol: 189 mg/dL (ref 0–200)
HDL: 46 mg/dL (ref >39)
LDL Cholesterol: 118 mg/dL — ABNORMAL HIGH (ref 0–99)
Triglycerides: 127 mg/dL (ref <150)

## 2010-12-11 LAB — BASIC METABOLIC PANEL
BUN: 10 mg/dL (ref 6–23)
CO2: 29 mEq/L (ref 19–32)
Chloride: 105 mEq/L (ref 96–112)
Creatinine, Ser: 0.75 mg/dL (ref 0.4–1.2)
Creatinine, Ser: 0.76 mg/dL (ref 0.4–1.2)
GFR calc Af Amer: >60 mL/min (ref >60)
Glucose, Bld: 97 mg/dL (ref 70–99)
Potassium: 4 mEq/L (ref 3.5–5.1)

## 2010-12-11 LAB — POCT CARDIAC MARKERS
Myoglobin, poc: 70.4 ng/mL (ref 12–200)
Troponin i, poc: <0.05 ng/mL (ref 0.00–0.09)
Troponin i, poc: <0.05 ng/mL (ref 0.00–0.09)

## 2010-12-12 LAB — T-HELPER CELL (CD4) - (RCID CLINIC ONLY)
CD4 % Helper T Cell: 29 % — ABNORMAL LOW (ref 33–55)
CD4 T Cell Abs: 470 uL (ref 400–2700)

## 2010-12-16 LAB — T-HELPER CELL (CD4) - (RCID CLINIC ONLY): CD4 T Cell Abs: 260 uL — ABNORMAL LOW (ref 400–2700)

## 2010-12-22 ENCOUNTER — Encounter: Payer: Self-pay | Admitting: Internal Medicine

## 2010-12-22 ENCOUNTER — Emergency Department (HOSPITAL_COMMUNITY): Payer: Medicare PPO

## 2010-12-22 ENCOUNTER — Observation Stay (HOSPITAL_COMMUNITY)
Admission: EM | Admit: 2010-12-22 | Discharge: 2010-12-25 | Disposition: A | Discharge status: Home / Self Care | Payer: Medicare PPO | Attending: Infectious Diseases | Admitting: Infectious Diseases

## 2010-12-22 DIAGNOSIS — F1210 Cannabis abuse, uncomplicated: Secondary | ICD-10-CM | POA: Insufficient documentation

## 2010-12-22 DIAGNOSIS — Z9119 Patient's noncompliance with other medical treatment and regimen: Secondary | ICD-10-CM | POA: Insufficient documentation

## 2010-12-22 DIAGNOSIS — E785 Hyperlipidemia, unspecified: Secondary | ICD-10-CM | POA: Insufficient documentation

## 2010-12-22 DIAGNOSIS — I10 Essential (primary) hypertension: Secondary | ICD-10-CM | POA: Insufficient documentation

## 2010-12-22 DIAGNOSIS — Z21 Asymptomatic human immunodeficiency virus [HIV] infection status: Secondary | ICD-10-CM | POA: Insufficient documentation

## 2010-12-22 DIAGNOSIS — Z91199 Patient's noncompliance with other medical treatment and regimen due to unspecified reason: Secondary | ICD-10-CM | POA: Insufficient documentation

## 2010-12-22 DIAGNOSIS — Z9861 Coronary angioplasty status: Secondary | ICD-10-CM | POA: Insufficient documentation

## 2010-12-22 DIAGNOSIS — F1410 Cocaine abuse, uncomplicated: Secondary | ICD-10-CM | POA: Insufficient documentation

## 2010-12-22 DIAGNOSIS — F341 Dysthymic disorder: Secondary | ICD-10-CM | POA: Insufficient documentation

## 2010-12-22 DIAGNOSIS — I252 Old myocardial infarction: Secondary | ICD-10-CM | POA: Insufficient documentation

## 2010-12-22 DIAGNOSIS — Z79899 Other long term (current) drug therapy: Secondary | ICD-10-CM | POA: Insufficient documentation

## 2010-12-22 DIAGNOSIS — R079 Chest pain, unspecified: Principal | ICD-10-CM | POA: Insufficient documentation

## 2010-12-22 LAB — COMPREHENSIVE METABOLIC PANEL
ALT: 9 U/L (ref 0–35)
AST: 17 U/L (ref 0–37)
Alkaline Phosphatase: 46 U/L (ref 39–117)
CO2: 23 mEq/L (ref 19–32)
Calcium: 8.9 mg/dL (ref 8.4–10.5)
Chloride: 106 mEq/L (ref 96–112)
GFR calc Af Amer: >60 mL/min (ref >60)
GFR calc non Af Amer: >60 mL/min (ref >60)
Glucose, Bld: 96 mg/dL (ref 70–99)
Potassium: 3.3 mEq/L — ABNORMAL LOW (ref 3.5–5.1)
Sodium: 136 mEq/L (ref 135–145)
Total Bilirubin: 0.4 mg/dL (ref 0.3–1.2)

## 2010-12-22 LAB — BASIC METABOLIC PANEL
BUN: 5 mg/dL — ABNORMAL LOW (ref 6–23)
Calcium: 9.2 mg/dL (ref 8.4–10.5)
Creatinine, Ser: 0.73 mg/dL (ref 0.4–1.2)
GFR calc Af Amer: >60 mL/min (ref >60)
GFR calc non Af Amer: >60 mL/min (ref >60)

## 2010-12-22 LAB — RAPID URINE DRUG SCREEN, HOSP PERFORMED
Amphetamines: NOT DETECTED
Opiates: POSITIVE — AB
Tetrahydrocannabinol: POSITIVE — AB

## 2010-12-22 LAB — D-DIMER, QUANTITATIVE: D-Dimer, Quant: 0.94 ug/mL-FEU — ABNORMAL HIGH (ref 0.00–0.48)

## 2010-12-22 LAB — DIFFERENTIAL
Basophils Relative: 1 % (ref 0–1)
Eosinophils Absolute: 0.1 10*3/uL (ref 0.0–0.7)
Lymphs Abs: 1.2 10*3/uL (ref 0.7–4.0)
Monocytes Relative: 12 % (ref 3–12)
Neutro Abs: 1.5 10*3/uL — ABNORMAL LOW (ref 1.7–7.7)
Neutrophils Relative %: 46 % (ref 43–77)

## 2010-12-22 LAB — CBC
Hemoglobin: 12.6 g/dL (ref 12.0–15.0)
MCH: 26.3 pg (ref 26.0–34.0)
Platelets: 214 10*3/uL (ref 150–400)
RBC: 4.79 MIL/uL (ref 3.87–5.11)
WBC: 3.2 10*3/uL — ABNORMAL LOW (ref 4.0–10.5)

## 2010-12-22 LAB — PROTIME-INR: Prothrombin Time: 13.2 seconds (ref 11.6–15.2)

## 2010-12-22 LAB — HEMOGLOBIN A1C: Hgb A1c MFr Bld: 5.9 % — ABNORMAL HIGH (ref <5.7)

## 2010-12-22 LAB — POCT CARDIAC MARKERS
CKMB, poc: 1 ng/mL (ref 1.0–8.0)
Troponin i, poc: <0.05 ng/mL (ref 0.00–0.09)

## 2010-12-22 LAB — TSH: TSH: 1.315 u[IU]/mL (ref 0.350–4.500)

## 2010-12-22 LAB — T-HELPER CELL (CD4) - (RCID CLINIC ONLY): CD4 % Helper T Cell: 23 % — ABNORMAL LOW (ref 33–55)

## 2010-12-22 MED ORDER — IOHEXOL 300 MG/ML  SOLN
100.0000 mL | Freq: Once | INTRAMUSCULAR | Status: AC | PRN
  Administered 2010-12-22: 100 mL via INTRAVENOUS

## 2010-12-22 NOTE — H&P (Signed)
Hospital Admission Note Date: 12/22/2010  Patient name: [name] Medical record number: [id number] Date of birth: 08-Aug-1961 Age: 50 y.o. Gender: female PCP: Martyn Malay, NP  Medical Service: Internal Medicine Teaching Service B1  Attending physician:  Dr. Darlina Sicilian Resident [phone]): Dr. Bethel Born  Pager: [phone] Resident (R1): Dr. Bard Herbert   Pager: [phone]  Chief Complaint: Chest pain  History of Present Illness: [name] is a 50 year old female with a PMH significant for CAD with stent placement in 2005 and restenting in 2008, HIV, HTN, HLD, recurrent HSV, and anxiety and depression who presented to the [facility] ER with complaints of chest pain that started Thursday.  She states that the pain started when she was working at her computer on Thursday in the afternoon.  She took a large breath and states "I had to stop it because of the pain."  The pain is in the center of her chest and does not radiate anywhere and is worse with deep inspiration.  She denies any fevers, chills, cough, abdominal pain, vomiting, trauma, or exertion.  She has had several repeat episodes over the last few days and they seemed to get worse today and more frequent which is why she decided to come into the ER today.  She has had several episodes with sweating and nausea but no vomiting.  She has been working out more recently over the last few weeks.  She also denies any alcohol, cocaine, or NSAID use.   Current Outpatient Prescriptions  Medication Sig Dispense Refill  . darunavir (PREZISTA) 400 MG tablet Take 800 mg by mouth.        Marland Kitchen emtricitabine-tenofovir (TRUVADA) 200-300 MG per tablet Take 1 tablet by mouth daily.        Marland Kitchen gabapentin (NEURONTIN) 400 MG capsule Take 400 mg by mouth 3 (three) times daily.        Marland Kitchen oxyCODONE-acetaminophen (PERCOCET) 10-325 MG per tablet Take 1 tablet by mouth every 4 (four) hours as needed.        . ritonavir (NORVIR) 100 MG capsule Take by mouth 2 (two) times  daily.        . valACYclovir (VALTREX) 500 MG tablet Take 500 mg by mouth 2 (two) times daily.         Allergies: Atorvastatin:  Forgetfulness, Zocor ok  PMH:  1. CAD s/p LAD stent placement in 2005 and restented in 2008 for in-stent stenosis 2. Former Cocaine abuse 3. HIV Last CD4 in 1/12 of 310 4. HTN 5. HLD 6. Anxiety and depression 7. Anemia  8. Post herpetic neuralgia 9. Recurrent shingles.  PSurgHx: 1. Cholecystectomy 2. C-section x2  Family History: No history of early CAD  History   Social History  . Marital Status: Divorced.    Spouse Name: N/A    Number of Children: 2  . Years of Education: N/A   Occupational History  . Not on file.   Social History Main Topics  . Smoking status: Former, quit 4 years previous  . Smokeless tobacco: none  . Alcohol Use: Former user, denies use in the last 3 months.  . Drug Use: THC 1-2 times weekly, denies current cocaine use  . Sexually Active: Not currently   Review of Systems: Negative except as noted in the HPI.  Vital signs:  Tm: 98.5  P: 69-79  BP: 130-140/80-90  O2 sat: 97% on RA  Resp: 18 Physical Exam: Constitutional: Vital signs reviewed.  Patient is a well-developed and well-nourished  female in no acute distress and cooperative with exam. Alert and oriented x3.  Head: Normocephalic and atraumatic Ear: TM normal bilaterally Mouth: no erythema or exudates, MMM Eyes: PERRL, EOMI, conjunctivae normal, No scleral icterus.  Neck: Supple, Trachea midline normal ROM, No JVD, mass, thyromegaly, or carotid bruit present.  Cardiovascular: RRR, S1 normal, S2 normal, no MRG, pulses symmetric and intact bilaterally Pulmonary/Chest: CTAB, no wheezes, rales, or rhonchi Abdominal: Soft. Obese, epigastric scar noted from previous cholecystectomy. Mild tenderness to palpation in the epigastric area, non-distended, bowel sounds are normal, no masses, organomegaly, or guarding present.  GU: no CVA tenderness Musculoskeletal: No  joint deformities, erythema, or stiffness, ROM full and no nontender Hematology: no cervical, inginal, or axillary adenopathy.  Neurological: A&O x3, Strength is normal and symmetric bilaterally, cranial nerve II-XII are grossly intact, no focal motor deficit, sensory intact to light touch bilaterally.  Skin: Warm, dry and intact. No rash, cyanosis, or clubbing.  Psychiatric: Normal mood and affect. speech and behavior is normal. Judgment and thought content normal. Cognition and memory are normal.    Lab results: CBC:    Component Value Date/Time   WBC 3.2* 12/22/2010 1316   HGB 12.6 12/22/2010 1316   HCT 39.6 12/22/2010 1316   PLT 214 12/22/2010 1316   MCV 82.7 12/22/2010 1316   NEUTROABS 1.5* 12/22/2010 1316   LYMPHSABS 1.2 12/22/2010 1316   MONOABS 0.4 12/22/2010 1316   EOSABS 0.1 12/22/2010 1316   BASOSABS 0.0 12/22/2010 1316   Comprehensive Metabolic Panel:    Component Value Date/Time   NA 142 12/22/2010 1316   K 3.6 12/22/2010 1316   CL 110 12/22/2010 1316   CO2 27 12/22/2010 1316   BUN 5* 12/22/2010 1316   CREATININE 0.73 12/22/2010 1316   GLUCOSE 84 12/22/2010 1316   CALCIUM 9.2 12/22/2010 1316   AST 16 09/22/2010 2050   ALT 8 09/22/2010 2050   ALKPHOS 53 09/22/2010 2050   BILITOT 0.5 09/22/2010 2050   PROT 7.2 09/22/2010 2050   ALBUMIN 4.2 09/22/2010 2050    CKMB, POC                                  1.0               1.0-8.0          ng/mL  Troponin I, POC                            <0.05             0.00-0.09        ng/mL  Myoglobin, POC                             47.2              12-200           Ng/mL  D-Dimer, Fibrin Derivatives                0.94       h      0.00-0.48        ug/mL-FEU  Imaging results:  1. Chest x-ray  Findings: Heart is borderline in size.  No confluent airspace opacity, effusion or edema.  No acute bony abnormality. 2. CT angiogram of the chest  IMPRESSION: No evidence of acute pulmonary embolus.  No acute findings in the chest.  Assessment & Plan by  Problem: 1. Chest pain:  Differential diagnosis includes ACS, PE, pneumonia, musculoskeletal, anxiety, or GERD.  CT angiogram was done in the ED which was negative for clot and with the lack of worsening SOB PE is less likely.  There is no consolidation on her chest x-ray, the lack of fever, and non-elevated white count pneumonia or infectious process is less likely.  She has a significant history of CAD with stent and MI so we will need to rule her out for ACS.  She has some significant T wave inversions in her anterior leads that are different from her previous EKG.     - Admit to SDU due to continued chest pain  - CE q8 x3  - EKG in the am.  - 2d echo to assess her current EF  - Protonix 40 mg daily  - UDS, TSH, Cmet, PTT, PTINR, FLP, HgBA1C  - Coreg 3.125 mg BID  - Aspirin 81 mg daily  - Nitro PRN for chest pain  - CBC and Bmet in am  - GI cocktail in ED  - If she rules in we will contact cardiology (Dr. Donnie Aho is her cardiologist) and start a heparin and nitro drip. 2. HIV:  She was diagnosed in 1990 and her last CD4 count was 310 in January.  She is due for a recheck of this at this time so we will check CD4 and HIV viral load.  She states that she has been taking her medications and has no indications for prophylaxis at this time.  - Continue Prevista, Truvada, and Norvir  - Check CD4 and HIV VL  3. Post-herpetic neuralgia:  She has had recurrent shingles and has pain in her thighs likely related to that.  She uses Percocet 10/325 PRN at home as well as neurontin so we will continue that in the hospital  4. Depression and anxiety:  Per her last discharge summary from [facility] she is supposed to be on Trazodone so we will continue that in the hospital here to help her sleep.  5. Marijuana use:  Will have CSW come and speak with her about helping her to stop marijuana.  6. VTE: Lovenox 40 mg Subcut  daily.    R2/3______________________________    R1________________________________  ATTENDING: I performed and/or observed a history and physical examination of the patient.  I discussed the case with the residents as noted and reviewed the residents' notes.  I agree with the findings and plan--please refer to the attending physician note for more details.  Signature________________________________  Printed Name_____________________________

## 2010-12-23 DIAGNOSIS — R079 Chest pain, unspecified: Secondary | ICD-10-CM

## 2010-12-23 LAB — CARDIAC PANEL(CRET KIN+CKTOT+MB+TROPI)
CK, MB: 1 ng/mL (ref 0.3–4.0)
CK, MB: 1 ng/mL (ref 0.3–4.0)
Total CK: 85 U/L (ref 7–177)

## 2010-12-23 LAB — BASIC METABOLIC PANEL
BUN: 5 mg/dL — ABNORMAL LOW (ref 6–23)
Chloride: 109 mEq/L (ref 96–112)
Glucose, Bld: 108 mg/dL — ABNORMAL HIGH (ref 70–99)
Potassium: 3.4 mEq/L — ABNORMAL LOW (ref 3.5–5.1)

## 2010-12-23 LAB — CBC
HCT: 37.2 % (ref 36.0–46.0)
MCH: 26.1 pg (ref 26.0–34.0)
MCHC: 31.5 g/dL (ref 30.0–36.0)
MCV: 82.9 fL (ref 78.0–100.0)
RDW: 14.6 % (ref 11.5–15.5)

## 2010-12-23 LAB — GLUCOSE, CAPILLARY: Glucose-Capillary: 229 mg/dL — ABNORMAL HIGH (ref 70–99)

## 2010-12-23 LAB — LIPID PANEL
LDL Cholesterol: 122 mg/dL — ABNORMAL HIGH (ref 0–99)
Triglycerides: 102 mg/dL (ref <150)

## 2010-12-24 ENCOUNTER — Telehealth: Payer: Self-pay | Admitting: *Deleted

## 2010-12-24 ENCOUNTER — Inpatient Hospital Stay (HOSPITAL_COMMUNITY): Payer: Medicare PPO

## 2010-12-24 NOTE — Telephone Encounter (Signed)
[name] called  To adv she is in the hospital. She went in with Shortness of Breath. She  Also told me that she never kept the PFT appointment I set for her and was wondering if she could have it while she is in the hospital. Adv her I would notify the provider and see what he wants to do and what he can do while she is inpatient.

## 2010-12-25 ENCOUNTER — Inpatient Hospital Stay (HOSPITAL_COMMUNITY): Payer: Medicare PPO

## 2010-12-25 DIAGNOSIS — R079 Chest pain, unspecified: Secondary | ICD-10-CM

## 2010-12-25 LAB — BASIC METABOLIC PANEL
BUN: 6 mg/dL (ref 6–23)
Calcium: 8.5 mg/dL (ref 8.4–10.5)
GFR calc non Af Amer: >60 mL/min (ref >60)
Potassium: 3.9 mEq/L (ref 3.5–5.1)
Sodium: 136 mEq/L (ref 135–145)

## 2010-12-25 LAB — GLUCOSE, CAPILLARY: Glucose-Capillary: 96 mg/dL (ref 70–99)

## 2010-12-25 MED ORDER — TECHNETIUM TC 99M TETROFOSMIN IV KIT
30.0000 | PACK | Freq: Once | INTRAVENOUS | Status: AC | PRN
  Administered 2010-12-25: 30 via INTRAVENOUS

## 2010-12-31 ENCOUNTER — Other Ambulatory Visit (INDEPENDENT_AMBULATORY_CARE_PROVIDER_SITE_OTHER): Payer: Medicare PPO

## 2010-12-31 DIAGNOSIS — B20 Human immunodeficiency virus [HIV] disease: Secondary | ICD-10-CM

## 2010-12-31 DIAGNOSIS — Z79899 Other long term (current) drug therapy: Secondary | ICD-10-CM

## 2011-01-01 LAB — CBC WITH DIFFERENTIAL/PLATELET
Basophils Relative: 1 % (ref 0–1)
Hemoglobin: 10.7 g/dL — ABNORMAL LOW (ref 12.0–15.0)
MCHC: 30.9 g/dL (ref 30.0–36.0)
Monocytes Relative: 13 % — ABNORMAL HIGH (ref 3–12)
Neutro Abs: 2.2 10*3/uL (ref 1.7–7.7)
Neutrophils Relative %: 53 % (ref 43–77)
Platelets: 204 10*3/uL (ref 150–400)
RBC: 4.13 MIL/uL (ref 3.87–5.11)

## 2011-01-01 LAB — COMPLETE METABOLIC PANEL WITH GFR
ALT: 13 U/L (ref 0–35)
AST: 16 U/L (ref 0–37)
Albumin: 4.1 g/dL (ref 3.5–5.2)
Alkaline Phosphatase: 42 U/L (ref 39–117)
GFR, Est Non African American: >60 mL/min (ref >60)
Glucose, Bld: 83 mg/dL (ref 70–99)
Potassium: 4.3 mEq/L (ref 3.5–5.3)
Sodium: 144 mEq/L (ref 135–145)
Total Bilirubin: 0.4 mg/dL (ref 0.3–1.2)
Total Protein: 7 g/dL (ref 6.0–8.3)

## 2011-01-01 LAB — LIPID PANEL
Cholesterol: 162 mg/dL (ref 0–200)
HDL: 56 mg/dL (ref >39)
LDL Cholesterol: 96 mg/dL (ref 0–99)
Total CHOL/HDL Ratio: 2.9 Ratio
Triglycerides: 51 mg/dL (ref <150)
VLDL: 10 mg/dL (ref 0–40)

## 2011-01-03 NOTE — Discharge Summary (Signed)
Please see dictated discharge summary for full details.  [name] was discharged from [facility] in stable and improved condition.  She will have a follow up appointment with Traci Sermon, NP at the [facility] on 01/05/11 at 9:15 in the AM.  She should have a referral to a new cardiologist since she would like to switch from Dr. Donnie Aho.  She should be evaluated to see if she has had a return of her chest pain and if she has been able to get her medications as listed above.

## 2011-01-05 ENCOUNTER — Ambulatory Visit: Payer: Medicare PPO | Admitting: Adult Health

## 2011-01-05 NOTE — Discharge Summary (Signed)
[name], [name]              ACCOUNT NO.:  [id number]  MEDICAL RECORD NO.:  [id number]           PATIENT TYPE:  O  LOCATION:  5511                         FACILITY:  [facility]  PHYSICIAN:  [name], M.D.  DATE OF BIRTH:  1961-05-21  DATE OF ADMISSION:  12/22/2010 DATE OF DISCHARGE:  12/25/2010                              DISCHARGE SUMMARY   DISCHARGE DIAGNOSES: 1. Atypical chest pain with a history of coronary artery disease. 2. Human immunodeficiency virus. 3. Hypertension. 4. Hyperlipidemia. 5. Gastroesophageal reflux disease. 6. Anxiety and depression. 7. Anemia. 8. Postherpetic neuralgia. 9. Recurrent shingles.  DISCHARGE MEDICATIONS: 1. Aspirin 81 mg to take 1 tablet daily by mouth. 2. Coreg 3.125 mg to take by mouth twice daily. 3. Plavix 75 mg to take 1 tablet daily. 4. Gabapentin 400 mg caplets to take 1 caplet 3 times daily. 5. Imdur 60 mg tablets to take 1 tablet daily. 6. Protonix 40 mg tablets to take 1 tablet daily. 7. Crestor 20 mg tablets to take 1 tablet daily at bedtime. 8. Trazodone 50 mg to take 1 tablet daily by mouth at bedtime as     needed. 9. Ritonavir 1 tablet daily in the morning. 10.Percocet 10/325 to take 1 tablet daily as needed for pain. 11.Prezista 1 tablet by mouth every day. 12.Truvada 1 tablet by mouth every day. 13.Valtrex 500 mg tablets to take 1 tablet twice daily as needed for     outbreaks.  DISPOSITION AND FOLLOWUP:  [name] was discharged from [facility] in stable and improved condition.  She will have followup appointment with [name], nurse practitioner at [facility] for Infectious Disease on Jan 05, 2011, at 9:15 in the morning.  She should have referral to a new cardiologist at that time since she would like to switch from [name].  She should also be evaluated to see if she has had return of her chest pain and if she is able to get her medications as listed in this  discharge summary.  ADMISSION HISTORY OF PRESENT ILLNESS:  [name] is a 50 year old woman with a past medical history of CAD with stent placement in 2005 and re-stenting in 2008, HIV, hypertension, hyperlipidemia, recurrent HSV, and anxiety and depression who presents to the [facility] Emergency Room with complaints of chest pain that started Thursday prior to admission.  She states that the pain started which was working on her computer on Thursday afternoon.  She took a large breath and states that, "I had to stop it because of the pain."  Pain is in the center of her chest and does not radiate anywhere.  It is worse with deep inspiration.  She denies any fevers, chills, cough, abdominal pain, vomiting, trauma, or exertion.  She has had several repeat episodes over the last few days and they seem to get worse on the date of admission, more frequent which is why she decided to come to the ER on the date of admission.  She has had several episodes of sweating and nausea, but no vomiting.  She has been working up more recently over the  last few weeks and she denies any alcohol, cocaine, or NSAID use.  ADMISSION PHYSICAL EXAMINATION:  VITAL SIGNS:  Temperature was 98.5, blood pressure was 130/80, pulse was 69, respirations were 18, and saturating 97% on room air. GENERAL:  This was a well-developed, well-nourished female in no acute distress, cooperative with examination.  Alert and oriented x3. HEENT:  Head is normocephalic and atraumatic.  Tympanic membranes were normal bilaterally.  Mouth showed no erythema or exudates.  Mucous membranes were moist.  Pupils were equal, round, and reactive to light. Extraocular movements were intact.  Conjunctivae were normal.  No scleral icterus. NECK:  Supple with a midline trachea.  No JVD, masses, thyromegaly, or carotid bruits present. CARDIOVASCULAR:  Regular rate and rhythm.  Normal S1 and S2.  No murmurs, rubs, or gallops.  Pulses were  symmetric and intact bilaterally. CHEST:  Clear to auscultation bilaterally.  No wheezes, rales, or rhonchi. ABDOMEN:  Soft and obese.  There is an epigastric scar noted from previous cholecystectomy.  Mild tenderness to palpation in the epigastric area.  Nondistended.  Bowel sounds were normal.  No masses, organomegaly, or guarding present. GU:  No CVA tenderness. MUSCULOSKELETAL:  No joint deformities, erythema, or stiffness.  There is full range of motion and they are nontender. HEMATOLOGY:  No cervical, inguinal, or axillary lymphadenopathy. NEUROLOGIC:  Alert and oriented x3.  Strength was symmetric and normal bilaterally.  Cranial nerves II-XII were grossly intact on examination. No focal motor defect.  Sensory was intact to touch bilaterally. SKIN:  Warm, dry, and intact.  No rashes, cyanosis, or clubbing. PSYCHIATRIC:  Normal mood and affect.  Speech and behavior were normal. Judgment and thought content were normal.  Cognition and memory were normal.  ADMISSION LABORATORY DATA:  White count was 3.2, hemoglobin was 12.5, hematocrit was 39.6, platelets were 214, MCV was 82.7, absolute neutrophil count was 1.5.  Sodium was 132, potassium 3.6, chloride was 110, bicarb was 27, BUN was 5, creatinine was 0.73, glucose was 84, calcium was 9.5, AST 16, ALT 8, alk phos 53, bilirubin was 0.5, protein 7.2, and albumin of 4.2.  She did have one set of point-of-care cardiac markers which showed a troponin of less than 0.05 and D-dimer was elevated at 0.94.  CONSULTATIONS:  Cardiology with [name].  PROCEDURE PERFORMED: 1. Chest x-ray which showed heart borderline in size.  No confluent     airspace opacity, effusion, or edema.  No acute bony abnormality. 2. CT angio of the chest which showed no evidence of acute pulmonary     embolus.  No acute findings in the chest. 3. Cardiac Myoview.  Impression was no areas irreversibility to     suggest inducible ischemia, fixed defect  involving the apex and     apical left ventricle likely remote infarct. 4. Hypokinesis globally with more focal akinesia to dyskinesia     involving the apical segments and extending to the lateral wall and     ejection fraction estimated at 34%. 5. Echocardiogram.  Impression was left ventricle cavity size, normal     systolic function was moderately reduced with an estimated ejection     fraction of 35-40%, akinesis of the mid distal anteroseptal,     anteroapical myocardium.  Left atrium was moderately dilated and     pulmonary artery systolic pressure was mildly increased with peak     pressures approximately 31 mmHg.  HOSPITAL COURSE BY PROBLEM: 1. Atypical chest pain with a history of CAD.  Ms. Horvath was     initially admitted for an ACS rule out.  With her extensive     coronary artery disease history including known thrombosed stent     and medication noncompliance with not taking her Plavix, she was at     high risk for possible repeat ischemia.  There was also concern for     PE as well as pneumonia.  Given the fact that her white count was     normal and there was no consolidation on x-ray and lack of fever,     infectious process was likely not indicated.  A CT angio of the     chest was negative for clot, so PE was essentially ruled out.     Because of her significant history of CAD with a stent and MI, we     had ruled her out.  She does have significant T-wave inversions in     her anterior lead on her admission EKG.  Cardiac enzymes were     trended and were negative throughout her admission.  She was seen     by [name] and he recommended restarting all of the medications     that she had been on previously including Coreg, Imdur, Plavix,     Crestor, and her aspirin which was done on the day after admission.     He also suggested we do a 2-D Lexiscan to check for inducible     ischemia in lieu of doing a cardiac catheterization.  Lexiscan was     performed and  actually showed no areas of inducible ischemia and     since her enzymes remained normal she was discharged on her     medication with compliance to follow up with Cardiology.  The     patient expressed that she would like to have a different     cardiologist other than [name], so we will have her primary     care doctor, [name] at the [facility] for     Infectious Disease, refer her to a new cardiologist.  She was going     to ask family members for a possible recommendation as to who would     be best for her to see.  There was also the possibility of this     being involved with her GERD since she had not been on medications.     She was discharged on Protonix 40 mg daily and had no recurrence of     her chest pain since admission when she was discharged. 2. HIV:  She was diagnosed in 1990 with her last CD-4 count of 310 in     January.  She is due for a recheck in approximately a month after     discharge, so we will make a followup appointment with [name] over at the [facility] for Infectious Disease to     have her routine monitoring done.  She was continued on her     Prezista, Truvada, and Norvir throughout her stay. 3. Depression and anxiety:  During her last discharge summary from     [facility], she was supposed to be on trazodone which we will continue in     the hospital here to help her sleep.  The patient denied any     suicidal or homicidal thoughts and states that her mood has     actually been pretty good as  of late.  She was discharged on her     trazodone and will need continued followup with her psychiatrist. 4. Hypertension:  She was not on any medications, but with her known     coronary artery disease she would benefit from both a low dose of a     beta-blocker as well as a low dose of an ACE inhibitor if possible.     She was started on Coreg 3.125 b.i.d. here in the hospital and if     blood pressure can tolerate it she should  have a small amount of     lisinopril or similar medicine added and follow up.  She should     also have a repeat creatinine done to make sure after starting     lisinopril to ensure that she does not have any problems with her     glomerular function. 5. Hyperlipidemia:  Fasting lipid panel on admission showed a total     cholesterol of 188, triglycerides of 102, HDL of 46, and an LDL of     122.  In this patient with known CAD, her LDL cholesterol goal is     to be less than 70.  She was started on Crestor 20 mg because of     intolerance to Lipitor which she says causes forgetfulness.  She     should have this monitored by her primary care doctor to ensure     that she tolerates the medicine and it is controlling her LDL     cholesterol.  DISCHARGE VITAL SIGNS:  Temperature was 98.5, blood pressure was 129/83, pulse was 94, respirations 18, and saturating 98% on room air.  DISCHARGE LABORATORY DATA:  Sodium was 136, potassium 3.9, chloride was 107, bicarb was 24, BUN was 6, creatinine was 0.68, and blood sugar was 94.  Calcium was 8.5.    ______________________________ Leodis Sias, MD   ______________________________ [name], M.D.    CP/MEDQ  D:  01/03/2011  T:  01/04/2011  Job:  [id number]  cc:   Brooke Chad, NP  Electronically Signed by Leodis Sias MD on 01/04/2011 10:18:54 AM Electronically Signed by Lina Sayre M.D. on 01/05/2011 06:55:48 PM

## 2011-01-08 ENCOUNTER — Other Ambulatory Visit: Payer: Self-pay | Admitting: Licensed Clinical Social Worker

## 2011-01-08 ENCOUNTER — Telehealth: Payer: Self-pay | Admitting: Licensed Clinical Social Worker

## 2011-01-08 DIAGNOSIS — M1990 Unspecified osteoarthritis, unspecified site: Secondary | ICD-10-CM

## 2011-01-08 MED ORDER — OXYCODONE-ACETAMINOPHEN 10-325 MG PO TABS: 1.0000 | ORAL_TABLET | Freq: Three times a day (TID) | ORAL | Status: DC | PRN

## 2011-01-08 NOTE — Telephone Encounter (Signed)
Patient states that she is out of pain  medication and would like something called in like Vicodin, until her visit on 5/11

## 2011-01-08 NOTE — Telephone Encounter (Signed)
May have refill of Percocet at current dose and frequency with 25 tabs, no refills.  Will need referral to pain management and ortho when she is seen in clinic.  She was informed of this on 10/15/2010.

## 2011-01-08 NOTE — Telephone Encounter (Signed)
Patient

## 2011-01-12 ENCOUNTER — Other Ambulatory Visit: Payer: Self-pay | Admitting: Licensed Clinical Social Worker

## 2011-01-12 DIAGNOSIS — M1990 Unspecified osteoarthritis, unspecified site: Secondary | ICD-10-CM

## 2011-01-12 MED ORDER — OXYCODONE-ACETAMINOPHEN 10-325 MG PO TABS: 1.0000 | ORAL_TABLET | Freq: Three times a day (TID) | ORAL | Status: DC | PRN

## 2011-01-15 ENCOUNTER — Encounter: Payer: Self-pay | Admitting: Adult Health

## 2011-01-15 ENCOUNTER — Ambulatory Visit (INDEPENDENT_AMBULATORY_CARE_PROVIDER_SITE_OTHER): Payer: Medicare PPO | Admitting: Adult Health

## 2011-01-15 DIAGNOSIS — B20 Human immunodeficiency virus [HIV] disease: Secondary | ICD-10-CM

## 2011-01-15 DIAGNOSIS — E669 Obesity, unspecified: Secondary | ICD-10-CM

## 2011-01-15 NOTE — Progress Notes (Signed)
  Subjective:    Patient ID: [name], female    DOB: 1961/05/22, 50 y.o.   MRN: [id number]  HPI    Review of Systems     Objective:   Physical Exam        Assessment & Plan:

## 2011-01-19 NOTE — Consult Note (Signed)
NAMEAVRIE, [name]              ACCOUNT NO.:  [id number]   MEDICAL RECORD NO.:  [id number]          PATIENT TYPE:  INP   LOCATION:  1829                         FACILITY:  [facility]   PHYSICIAN:  Bevelyn Buckles. Bensimhon, MDDATE OF BIRTH:  1961/05/22   DATE OF CONSULTATION:  06/23/2007  DATE OF DISCHARGE:                                 CONSULTATION   REASON FOR CONSULTATION:  Unstable angina.   HISTORY OF PRESENT ILLNESS:  [name] is a 50 year old woman with  multiple medical problems including history of polysubstance abuse, HIV  with non-detectable viral load and premature coronary artery disease. In  November 2005, she was found to have high grade LAD disease and  underwent stenting of her LAD. In August of 2008, she apparently  experienced an anterior MI in the setting of cocaine use. She was found  to have a stent thrombosis. She was treated with emergent PTCA and  stenting. Her ejection fraction was 40% to 45%. She was admitted to  [facility] last week with recurrent chest pain and experienced a  non-ST elevation myocardial infarction with a peak troponin of 14.85.  She underwent cardiac catheterization, which showed normal left main,  left circumflex, and RCA. Her LAD was totally occluded within the stent.  There were good right to left and left to left collaterals. The decision  was made to treat her medically as this was thought to be a chronic  total occlusion. She was discharged home on June 20, 2007. Since that  time, she has had chest pain and diaphoresis, just on minimal exertion.  It got worse tonight, radiating down her arm. She took some  nitroglycerin and got better but not completely, so she came to the  emergency room. Nitroglycerin paste was applied and now, she is pain  free. EKG shows lateral T wave inversion, which is unclear if this is  old or new. She has had 2 set of point of care markers, which have been  negative. She denies cocaine use for 6  days.   REVIEW OF SYSTEMS:  She denies any heart failure. She has not had any  orthopnea, no PND, no lower extremity edemas. She has had some  palpitations. No syncope or pre-syncope. No bleeding. She has been  compliant with her medication apparently. The remainder of review of  systems is negative except for HPI and problem list.   PROBLEM LIST:  1. Premature coronary artery disease in the setting of polysubstance      abuse.      a.     Initial PTCA and stenting of the LAD in November of 2005.      b.     Anterior ST elevation myocardial infarction due to stent       thrombosis in August of 2008, treated with PTCA and stenting.      c.     Recent non-ST elevation myocardial infarction in October of       2008 with a totally occluded LAD on catheterization, treated       medically. No significant coronary disease elsewhere.  2. Ischemic  cardiomyopathy with an ejection fraction of 40% to 45% by      echocardiogram.  3. Polysubstance abuse with a history of tobacco, alcohol, cocaine,      and marijuana.  4. HIV with a non-detectable viral load.  5. History of cholelithiasis status post cholecystectomy.   CURRENT MEDICATIONS:  1. Aspirin 325 mg daily.  2. Lisinopril 10 mg daily.  3. Coreg 3.125 mg b.i.d.  4. Lasix 10 mg daily.  5. Isosorbide dinitrate 10 mg b.i.d.  6. Zoloft 50 daily.  7. Trizivir 1 tablet every 12 hours.  8. Simvastatin 20 mg at night.  9. Valtrex 500 mg once daily.  10.She is also on Plavix.   ALLERGIES:  LIPITOR (she gets muscle aches.)   SOCIAL HISTORY:  She is divorced. She has 6 children. She just re-  located to [place]. She has a history of heavy tobacco and alcohol  use. She quit tobacco a year ago and alcohol several months ago. She  also uses cocaine and smokes marijuana. She has been off cocaine now for  6 days by her report.   FAMILY HISTORY:  Mother developed coronary disease in her 57's and died  in her 68's. She does not know her  father.   PHYSICAL EXAMINATION:  GENERAL:  She is sitting up in bed. No acute  distress.  VITAL SIGNS:  Blood pressure 110/47, heart rate 75. She is sating in the  high 90's on room air.  HEENT:  Normal.  NECK:  Supple. She has evidence of thyromegaly, consistent with goiter.  There is no lymphadenopathy. It is hard to assess her venous pressure  but it looks flat. Carotids are 2+ bilaterally without any bruits.  CARDIAC:  PMI is non-palpable. She has distant heart sounds. She is  regular with no obvious murmurs, rubs, or gallops.  LUNGS:  Clear.  ABDOMEN:  Obese, nontender, nondistended. No hepatosplenomegaly, no  bruits, no masses. Good bowel sounds.  EXTREMITIES:  Warm with no clubbing, cyanosis, or edema. No rash. Good  pulses.  NEUROLOGIC: Alert and oriented times three. Cranial nerves 2-12 are  intact. Moves all 4 extremities without difficulty. Affect is pleasant.   LABORATORY DATA:  EKG shows sinus rhythm at a rate of 76 with anterior  septal Q waves. There is lateral T wave inversion. No old EKG's to  compare. She does have also a high lateral infarct.   White count 4.8. Hemoglobin is 11.1. Platelets are 234,000. Sodium 141,  potassium 4.2. chloride 106, BUN 8. There is no creatinine. Point of  care markers are negative x2 with troponin of less than 0.05 and a CK MB  of less then 1.0.   ASSESSMENT:  1. Unstable angina.  2. Coronary artery disease status post recent myocardial infarction      and history of chronic total occlusion of the left anterior      descending by catheterization last week.  3. Ischemic cardiomyopathy with an ejection fraction of 40% to 45%.  4. HIV with an undetectable viral load.  5. Polysubstance abuse.   PLAN:  Post discussion, she will be admitted to [facility]. She  will be ruled out for myocardial infarction with serial cardiac markers.  Will treat her with heparin, nitroglycerin, and resume her Plavix. She  will be seen by Dr.  Donnie Aho in the morning, who can review her  catheterization films or consider transfer back to [facility].      Bevelyn Buckles. Bensimhon, MD  Electronically Signed  DRB/MEDQ  D:  06/23/2007  T:  06/23/2007  Job:  [id number]   cc:   Wendie Simmer, M.D.  Hettie Holstein, D.O.

## 2011-01-19 NOTE — Discharge Summary (Signed)
[name], [name] NO.:  [id number]   MEDICAL RECORD NO.:  [id number]          PATIENT TYPE:  IPS   LOCATION:  0300                          [facility]  [facility]   PHYSICIAN:  Jasmine Pang, M.D. DATE OF BIRTH:  May 25, 1961   DATE OF ADMISSION:  09/15/2007  DATE OF DISCHARGE:  09/21/2007                               DISCHARGE SUMMARY   IDENTIFICATION:  This is a 50 year old divorced African American female  who was admitted on September 15, 2007.   HISTORY OF PRESENT ILLNESS:  Ms. Buch actually presents to [place] coming  over from the main hospital.  She was admitted there on September 12, 2007,  due to chest pain in the context of previously diagnosed coronary artery  disease.  She stated that she had been abstinent from illegal drugs  since October 2008.  However, she has been experiencing progressive  depressed mood, anhedonia, difficulty concentrating, thoughts of  hopelessness and helplessness.  She also was having some insomnia as  well as suicidal thoughts, mood swings, and frequent crying.  She stated  that if it were not for her religious belief, she might have committed  suicide already.  The patient has a prior anterior infarction and a  prior total occlusion of a previously placed drug-eluting stent in  October 2008.  She is also known to have adult immunodeficiency syndrome  disorder with a recent increase in viral load.  She does have coronary  artery disease with an LAD stent placed in 2005 with a subsequent  anterior myocardial infarction associated with cocaine abuse and stent  thrombosis in August 2008, treated by PTCA.  She had another non-ST  elevated MI in September 2008.  Repeat catheterization showed that the  LAD was occluded; however, she had collaterals and she was placed on  medical therapy.  All of these interventions and catheterizations  occurred in [place] or at [facility].  She was medically cleared  upon admission.  She did not have  any elevation of her troponin and was  doing well on her current cardiac medication.   PAST PSYCHIATRIC HISTORY:  In 1994, the patient was admitted to a  psychiatric hospital in [place].  She had a newborn who died within  24 hours after birth around this admission.  This was a brief  hospitalization with no outpatient followup.  She denied any other  outpatient care.   FAMILY HISTORY:  The patient states her maternal aunt had depression.   ALCOHOL AND DRUG HISTORY:  The patient denies.   Her primary care Jawan Chavarria is Dr. Philipp Deputy who also follows her for HIV.  She is followed cardiac-wise by Dr. Viann Fish.   PAST MEDICAL HISTORY:  The patient has hypertension, hyperlipidemia, HIV  with recent increase in viral load.  She also has coronary artery  disease as indicated in history of present illness.   ALCOHOL HISTORY:  The patient is in sustained remission from  polysubstance abuse.   MEDICATIONS:  The patient is currently on,  1. Paroxetine 20 mg daily.  2. Lisinopril 10 mg daily.  3. Simvastatin 20 mg  p.o. daily.  4. Furosemide 10 mg p.o. daily.  5. Carvedilol 6.25 mg b.i.d.  6. Plavix 75 mg daily.  7. Aspirin 81 mg daily.  8. Valtrex 500 mg p.o. b.i.d.  9. Trizivir 1 tablet b.i.d.  10.Isosorbide mononitrate 60 mg in the morning.  11.She has been on Paxil for a month or less.  She was seen in      consultation while hospitalized by Dr. Jeanie Sewer, who recommended      increasing the Paxil to 30 mg.   ALLERGIES:  The patient states that LIPITOR resulted in memory changes.   PHYSICAL FINDINGS:  [name] complete history and physical was well  documented in the chart.  There were no acute physical problems noted.   ADMISSION LABORATORIES:  TSH was 1.377, which was within normal limits.  Other labs were done while she was in the hospital on the [facility].   ADMISSION DIAGNOSES:  Axis I:  Mood disorder, not otherwise specified.  Axis II:  History  of polysubstance abuse.   HOSPITAL COURSE:  Upon admission, the patient was  continued on her home  medications of Lasix 10 mg p.o. q.a.m., Trizivir 2 pills daily,  simvastatin 20 mg p.o. daily, Plavix 75 mg daily, Valtrex 500 mg p.o.  b.i.d., Coreg 6.25 mg b.i.d., lisinopril 10 mg daily, Paxil 20 mg daily,  and isosorbide 60 mg daily.  She was also started on Ambien 5 mg p.o.  q.h.s. p.r.n. insomnia.  On September 15, 2007, she was started on  Neurontin 300 mg p.o. q.6 h. p.r.n. anxiety and at bedtime.  On September 18, 2007, she was given NitroQuick 0.4 mg 1 tablet under her tongue q.5  minutes for 15 minutes, to be sent to the ED if not relieved in 15  minutes.  This did relieve her symptoms.  The patient tolerated her  medications well with no significant side effects.  She was cooperative  with me in individual sessions.  She also participated appropriately in  unit therapeutic groups and activities.  She initially stated she felt  bad.  She had a numerous somatic complaints.  Her sleep was impaired  (difficulty falling asleep).  She discussed her HIV positive status.  She was tearful and stated she was worried that she can't find a soul  mate.  She felt she would be shunned if someone found out she was HIV  positive.  As hospitalization progressed, she discussed some family  issues.  She lives with her daughter.  She is not sure she wants a  family session with the daughter, but aware she needs to do this.  She  allowed her case manager to call her to extend the offer.  She discussed  her guilt feelings about her family due to her drug use.  Her 18-year-  old daughter came to visit and this went well.  She felt better about  going home soon.  Sleep was good and appetite was good.  On September 21, 2007, mental status had improved markedly from admission status.  The  patient's mood was less depressed, less anxious.  Affect was consistent  with mood.  There was no suicidal or homicidal  ideation.  No thoughts of  self-injurious behavior.  No auditory or visual hallucinations.  No  paranoia or delusions.  Thoughts were logical and goal-directed.  Thought content, no predominant theme.  Cognitive was grossly back to  baseline.  The patient felt ready to go home.  She was felt she was safe  for discharge today.  Sleep and appetite were good.  She was having no  side effects to her medications.   DISCHARGE DIAGNOSES:  Axis I:  Mood disorder, not otherwise specified;  history of polysubstance dependence, in current remission.  Axis II:  None.  Axis III:  1. Human immunodeficiency virus syndrome with recent increase in viral      load.  2. Hypertension.  3. Hyperlipidemia.  4. Status post anterior myocardial infarction with prior total      occlusion of a previously placed drug-eluting stent in October      2008.  Axis IV:  Severe (problems with primary support group, burden of  psychiatric illness, burden of medical illness, other psychosocial  problems).  Axis V:  Global assessment of functioning upon discharge was 50.  GAF  upon admission was 40.  GAF highest past year was 65.   DISCHARGE PLANS:  There were no specific activity level or dietary  restrictions.   POSTHOSPITAL CARE PLANS:  The patient will go to the [facility] on  October 04, 2007, at 1:30 p.m.  She will also be seen at the [facility] for her medical problems as needed.   DISCHARGE MEDICATIONS:  1. Valtrex 1 twice daily.  2. Isosorbide 60 mg daily.  3. Nitroglycerin as directed.  4. Coreg 6.25 mg 2 daily.  5. Plavix 75 mg daily.  6. Simvastatin 20 mg daily.  7. Trizivir 1 every a.m. and bedtime.  8. Aspirin 81 mg daily.  9. Lasix 10 mg every a.m.  10.Paxil 20 mg daily.  11.Gabapentin 300 mg at bedtime.  12.Lisinopril 10 mg daily.      Jasmine Pang, M.D.  Electronically Signed     BHS/MEDQ  D:  10/03/2007  T:  10/04/2007  Job:  [id number]

## 2011-01-19 NOTE — Consult Note (Signed)
[name], [name] NO.:  [id number]   MEDICAL RECORD NO.:  [id number]          PATIENT TYPE:  INP   LOCATION:  6533                         FACILITY:  [facility]   PHYSICIAN:  Antonietta Breach, M.D.  DATE OF BIRTH:  1961/04/27   DATE OF CONSULTATION:  09/14/2007  DATE OF DISCHARGE:                                 CONSULTATION   REASON FOR CONSULTATION:  Depression, polysubstance dependence.   HISTORY OF PRESENT ILLNESS:  [name] is a 50 year old female  admitted to the [facility] on September 12, 2007 due to chest pain in  the context of coronary artery disease.   [name] states that she has been abstinent from illegal drugs for 2  months.  However, she has been experiencing 4 weeks of progressive  depressed mood, anhedonia, difficulty concentrating, thoughts of  hopelessness, helplessness, insomnia, as well as suicidal thoughts.  She  has been thinking that if it were not for her religious beliefs she  would have committed suicide   The patient does have mounting severe general medical problems.   PAST PSYCHIATRIC HISTORY:  The patient does have a severe pattern in the  past of abusing multiple substances, including alcohol, cocaine, and  marijuana.   She also has a history of prior major depression.  She was initially  tried on Zoloft, which was discontinued.  Her Paxil was started less  than 2 weeks ago, and the patient has been taking 20 mg daily.  She has  also been requiring sleep medication, and is currently on Ambien 10 mg  at bedtime p.r.n.   FAMILY PSYCHIATRIC HISTORY:  None known.   SOCIAL HISTORY:  The patient is divorced.  She has 6 children.  She has  been living with one of her daughters.   OCCUPATION:  Medically disabled.   REVIEW OF SYSTEMS:  CONSTITUTIONAL/HEAD, EYES, EARS, NOSE THROAT,  MOUTH/NEUROLOGIC/PSYCHIATRIC/CARDIOVASCULAR/GASTROINTESTINAL/RESPIRATORY  /GENITOURINARY/SKIN/ENDOCRINE/METABOLIC/MUSCULOSKELETAL/HEMATOLOGIC/LYMP  HATIC:  Unremarkable, except for hemoglobin of 11.1; anemia.   ALLERGIES:  LIPITOR.   MEDICATIONS:  The MAR is reviewed.  The patient is on the psychotropics  Paxil 20 mg daily, Ambien 10 mg at bedtime.   LABORATORY DATA:  WBC 5.4, hemoglobin 11.1, platelet count 232.  Sodium  132, BUN 7, creatinine 0.83.  Urine drug screen positive for  benzodiazepines. positive for THC.  TSH within normal limits.   REVIEW OF SYSTEMS:  CONSTITUTIONAL/HEAD, EYES, EARS, NOSE THROAT,  MOUTH/NEUROLOGIC/PSYCHIATRIC/CARDIOVASCULAR/GASTROINTESTINAL/RESPIRATORY  /GENITOURINARY/SKIN/ENDOCRINE/METABOLIC/MUSCULOSKELETAL/HEMATOLOGIC/LYMP  HATIC:  Unremarkable, except for hemoglobin of 11.1; anemia.   PAST MEDICAL HISTORY:  1. HIV.  2. Coronary artery disease.  3. Ischemic cardiomyopathy.  4. History of cholelithiasis and cholecystectomy.   PHYSICAL EXAMINATION:  VITAL SIGNS:  Temperature 97.9, pulse 66,  respiratory rate 18, blood pressure 111/49, O2 saturation on room air  100%.  GENERAL APPEARANCE:  [name] is a middle-aged female, lying in a  partially reclined supine position in her hospital bed, with no abnormal  involuntary movements.   MENTAL STATUS EXAM:  [name] is alert.  Her attention span is mildly  decreased.  Her eye contact is normal.  Her concentration  is mildly  decreased.  Her affect is very constricted, with periods of sobbing,  appropriate to content.  Mood is very depressed.  She is oriented to all  spheres.  Her memory is intact to immediate, recent, and remote.  Fund  of knowledge and intelligence are within normal limits.  Speech involves  normal rate and prosody, without dysarthria.  Thought process logical,  coherent, goal-directed.  No looseness of associations.  Language  expression and comprehension are intact.  Abstraction intact.  Thought  content:  Please see the history of present illness.   The patient does  not have thoughts of harming others, hallucinations, or delusions.  Insight is partial.  Judgment is intact for the need of treatment.   ASSESSMENT:   AXIS I:  293.83, mood disorder not otherwise specified, depressed  (functional and general medical elements).  Rule out 296.33, major depressive disorder, recurrent, severe.  Polysubstance dependence.   AXIS II:  Deferred.   AXIS III:  See general medical section above.   AXIS IV:  General medical.  Primary support group.   AXIS V:  40.   [name] is at risk to harm herself outside of the supportive  environment of the hospital.  She does contract for no harm and agrees  to call the nursing station if she develops any self-harm thoughts that  she cannot resist.   The undersigned provided ego-supportive psychotherapy and education.  The indications, alternatives. and adverse effects of Paxil were  discussed with the patient for antidepression.  She understands and  would like to proceed with the Paxil trial for antidepression as well as  an inpatient psychiatric admission for dual diagnosis treatment.   RECOMMENDATIONS:  1. Would increase her Paxil to 30 mg p.o. daily in 2 days.  2. Once medically cleared, would ask the social worker to arrange      admission to a psychiatric hospital.      Antonietta Breach, M.D.  Electronically Signed     JW/MEDQ  D:  09/15/2007  T:  09/15/2007  Job:  [id number]

## 2011-01-19 NOTE — Discharge Summary (Signed)
[name], [name]              ACCOUNT NO.:  [id number]   MEDICAL RECORD NO.:  [id number]          PATIENT TYPE:  INP   LOCATION:  4742                         FACILITY:  [facility]   PHYSICIAN:  Michaelyn Barter, M.D. DATE OF BIRTH:  03-28-1961   DATE OF ADMISSION:  06/22/2007  DATE OF DISCHARGE:  06/27/2007                               DISCHARGE SUMMARY   PRIMARY CARE DOCTOR:  Unassigned.   FINAL DIAGNOSES:  1. Chest pain.  2. Human immunodeficiency virus positive.  3. Bronchitis.  4. Hyponatremia.  5. Substance abuse.   SECONDARY DIAGNOSES:  1. Recent myocardial infarction.  2. Ischemic cardiomyopathy.   CONSULTATIONS:  Cardiology.  The patient was initially seen by [facility]  Cardiology, Dr. Gala Romney, followed by Dr. Donnie Aho.   HISTORY OF PRESENT ILLNESS:  [name] is a 50 year old female with a  history of coronary artery disease.  She indicated that she had recently  had a myocardial infarction and was treated at [facility].  The day  prior to this admission she developed pain in her left arm.  On the  morning of this admission she became diaphoretic and developed some  shortness of breath.  She indicated that her symptoms were very similar  to her recent previous heart attack.  For past medical history, please  see that dictated by Dr. Hannah Beat.   HOSPITAL COURSE:  #1 - CHEST PAIN.  Cardiology initially saw the  patient.  Dr. Gala Romney of [facility] indicated in his  assessment that the patient had unstable angina.  The plan was to admit  the patient to the telemetry floor and rule her out for a myocardial  infarction.  [name] troponin I was found to be 0.09, 0.07.  Her  CK-MBs were found to be 1.2 and 1.3 respectively.  She had a chest x-ray  completed on October 16 which revealed mild central airway thickening,  no active cardiopulmonary process was demonstrated.  The patient was  started on IV heparin as well as Plavix.  Dr. Donnie Aho later  saw the  patient.  He indicated that no further cardiac workup needs to be  pursued at this time.  He stated that he did not think that there is a  place for further cardiac PCI.  She has an occluded artery with  collaterals.  He indicated that cardiac therapy will be intensified  medically and he made adjustments to her medications.  Likewise, he  recommended cardiac rehab.  [name] chest pain did resolve over  the course of this hospitalization   #2 - LEFT SHOULDER PAIN.  The etiology of this is questionable.  An x-  ray of the patient's shoulder revealed mild acromioclavicular joint  degenerative changes; no fracture or dislocation was seen.  The  patient's pain was managed medically.   #3 - HISTORY OF HIV.  This was simply monitored.  The patient was  restarted on her home medication of Trizivir.   #4 - BRONCHITIS.  The patient complained of a cough that was very  prominent during the course of this hospitalization.  She was started on  moxifloxacin as well as Tussionex.  By the date of discharge, her cough  had improved.   #5 - HYPONATREMIA.  The patient had mild hyponatremia.  She was  otherwise asymptomatic secondary to this.   #6 - HISTORY OF SUBSTANCE ABUSE.  A urine drug screen was completed at  the time of admission.  It was negative for cocaine but positive for  benzodiazepines and positive for tetrahydrocannabinol.   CONDITION AT THE TIME OF DISCHARGE:  On the date of discharge the  patient had no complaints.   MEDICATIONS AT THE TIME OF DISCHARGE:  Consisted of:  1. Lisinopril 10 mg one tablet p.o. daily.  2. Avelox 400 mg tablet p.o. daily.  3. K-Dur 20 mEq one tablet p.o. daily.  4. Coreg 3.125 mg p.o. b.i.d.  5. Plavix 75 mg p.o. daily.  6. Combivent MDI two puffs q.i.d.  7. Imdur 60 mg one tablet p.o. daily.  8. Lasix 10 mg one tablet daily.  9. Zoloft 50 mg tablet p.o. daily.  10.Simvastatin 20 mg p.o. daily.  11.She will be told to resume her  Trizivir one tablet p.o. b.i.d. as      well as her Valtrex 500 mg p.o. b.i.d.   She will also be told to call her regular physician within the next 2-4  weeks and he should refer her to see a cardiologist.      Michaelyn Barter, M.D.  Electronically Signed     OR/MEDQ  D:  06/27/2007  T:  06/28/2007  Job:  [id number]
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
## 2011-01-19 NOTE — H&P (Signed)
[name], [name]              ACCOUNT NO.:  [id number]   MEDICAL RECORD NO.:  [id number]          PATIENT TYPE:  INP   LOCATION:  6533                         FACILITY:  [facility]   PHYSICIAN:  Francisca December, M.D.  DATE OF BIRTH:  06/21/1961   DATE OF ADMISSION:  09/12/2007  DATE OF DISCHARGE:                              HISTORY & PHYSICAL   REASON FOR ADMISSION:  Atypical unstable angina.   HISTORY OF PRESENT ILLNESS:  [name] is a pleasant but  unfortunate 50 year old woman with premature coronary disease and HIV  positivity.  Her cardiac history is extensive, but in brief includes LAD  stent placed in 2005 with subsequent anterior MI associated with cocaine  abuse and stent thrombosis in August 2008.  This was treated by PTCA  alone.  She then experienced an NSTEMI with a troponin as high as 14 in  September 2008.  A repeat catheterization showed an occluded LAD with  collaterals, and she was placed on medical therapy.  All these  interventions and catheterizations occurred at [facility] or [facility].  She is known to have an ejection fraction of 40-45%.   Yesterday evening she awoke with left hand and wrist tingling and left  arm discomfort extending up from the forearm into the arm.  This was an  aching pain.  It was associated with some mild left chest discomfort.  She took a single nitroglycerin with some relief.  It has been recurrent  throughout the day today, and she finally presented to [facility] emergency  room at 1430 with these complaints.  Evaluation thus far has included  the usual cardiac markers, EKG.  These have been unremarkable.  The EKG  does show a previous anterior MI.  At the time of my evaluation at 1800  she  continues to have left arm discomfort.  Denies any nausea,  diaphoresis or shortness of breath.  She does state that these symptoms  are exactly the same as the way her previous coronary events have  presented.   PAST MEDICAL  HISTORY:  1. Hypertension.  2. Hyperlipidemia.  3. Cholecystectomy.  4. History of polysubstance abuse.  5. HIV positivity with a recent increase in viral load.  She was      initially diagnosed in 75.   CURRENT MEDICATIONS:  1. Coreg 6.25 mg p.o. b.i.d.  2. Furosemide 10 mg p.o. daily.  3. Lisinopril 10 mg p.o. daily.  4. Paroxetine 20 mg p.o. daily.  5. Plavix 75 mg p.o. daily.  6. Simvastatin 20 mg p.o. daily.  7. Trizivir one tablet p.o. b.i.d.  8. Valtrex 500 mg p.o. b.i.d.   ALLERGIES:  LIPITOR.   FAMILY HISTORY:  Not significant for early coronary disease.   SOCIAL HISTORY:  She denies any recent drug abuse.  Does not use any  tobacco products.  She lives now in [place] with her daughter.  She  is disabled.   REVIEW OF SYSTEMS:  Significant only for left lower quadrant pain and  constipation.   PHYSICAL EXAMINATION:  VITAL SIGNS:  Blood pressure is 108/73, pulse 77  and regular, temperature afebrile, respiratory rate 18.  GENERAL:  She appears comfortable.  A 50 year old mildly obese African-  American woman.  HEENT:  Unremarkable.  Head is atraumatic, normocephalic.  Pupils are  equal, round, and reactive to light.  Sclerae are anicteric.  Oral  mucosa is pink and moist.  Tongue is not coated.  NECK:  Supple without thyromegaly or masses.  The carotid upstrokes are  normal.  No bruit.  No JVD.  CHEST:  Clear but excursion.  HEART:  Has regular rhythm.  Normal S1, S2 is heard.  No murmur, click  or rub.  ABDOMEN:  Soft, nontender with exception of left lower quadrant is  mildly tender with deep palpation.  No palpable sigmoid colon.  Bowel  sounds are present in all quadrants.  GENITAL:  External genitalia without lesions.  RECTAL:  Not performed.  EXTREMITIES:  Show full range of motion.  No edema.  Intact distal  pulses.  NEUROLOGICAL:  Cranial nerves II-XII are intact.  Motor and  sensory are grossly intact.  Gait not tested.  SKIN:  Warm, dry, and  clear.   LABORATORY DATA:  Initial laboratory assessment including creatinine,  serum electrolytes, BUN, glucose, and hemoglobin are all normal.  Initial point of care enzymes negative; troponin, CK-MB and myoglobin.   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  There is, otherwise, nonspecific T-wave  abnormality again unchanged from October 2008.   Chest x-ray is pending.   ASSESSMENT:  1. Unstable angina in atypical pattern.  It should be noted that the      patient ran out of her Isordil about a week ago.  Has not been able      to get it refilled.  2. Extensive history of previous coronary artery disease limited to      the anterior descending artery with infarction, stent thrombosis,      and now occlusion with collateral filling.  3. Mildly decreased left ventricle systolic function, EF 40-45%.  4. Human immunodeficiency virus positivity with an increasing viral      load by her history.  Apparently they are considering changing      medication.  5. Hypertension.  6. Hyperlipidemia.  7. Polysubstance abuse.  8. Cholecystectomy.   PLAN:  1. The patient is admitted for continued rule out of myocardial      infarction, telemetry monitoring, and pain control.  2. Will begin subcutaneous Lovenox at 1 mg/kg q.12 h.  3. Will begin IV nitroglycerin at 3 cc/hour titrated if necessary.  4. Will be made n.p.o. after midnight.  5. Begin aspirin.  6. Further measures per Dr. Viann Fish.      Francisca December, M.D.  Electronically Signed     JHE/MEDQ  D:  09/12/2007  T:  09/13/2007  Job:  [id number]   cc:   Georga Hacking, M.D.

## 2011-01-19 NOTE — Discharge Summary (Signed)
NAMERYLI, [name] NO.:  [id number]   MEDICAL RECORD NO.:  [id number]          PATIENT TYPE:  INP   LOCATION:  5505                         FACILITY:  [facility]   PHYSICIAN:  Brooke Charity, MD     DATE OF BIRTH:  01-Nov-1960   DATE OF ADMISSION:  06/10/2008  DATE OF DISCHARGE:  06/12/2008                               DISCHARGE SUMMARY   DISCHARGE DIAGNOSES:  1. Lower respiratory tract infection/upper respiratory tract      infection.  2. Elevated creatinine kinase.  3. Human immunodeficiency virus (CD4 of 480 in July 2009).  4. Coronary artery disease/ischemic cardiomyopathy, history of      myocardial infarction x3 status post stent x2.  5. Hypertension.  6. Hyperlipidemia.  7. Obesity.  8. Arthritis/bursitis.  9. History of polysubstance abuse, urine drug screen positive of THC      and opiates on June 11, 2008.   DISCHARGE MEDICATIONS:  1. Coreg 3.125 mg 1 tab p.o. b.i.d.  2. Aspirin 81 mg 1 tab p.o. daily.  3. Plavix 75 mg 1 tab p.o. daily.  4. Simvastatin 20 mg 1 tab p.o. at bedtime.  5. Paxil 30 mg 1 tab p.o. daily.  6. Zithromax 250 mg 1 tab p.o. daily x4 days following discharge.  7. Tamiflu 75 mg 1 tab p.o. b.i.d. for 2 more days following      discharge.   BRIEF ADMITTING HISTORY AND PHYSICAL:  [name] is a 50 year old  African American female with history of HIV, coronary artery disease  with MI x3 status post stent x2, ischemic cardiomyopathy, early COPD, ex-  smoker who presents to the ED complaining of fever with a maximum  temperature of 102.1 as well as productive cough and cold symptoms of  3 days' duration.  She states that her fever persisted and worsened over  the weekend, and she subsequently developed dark-brown sputum with  cough, but denies any bright red blood tinged sputum.  She admits to  nausea, but denies vomiting or diarrhea, admits to decrease appetite and  decrease liquid intake.  She also complains of frontal  headache,  described as constant dull pain without radiation, photophobia, or  visual changes.  States that her 62-year-old grandson was recently seen  in the hospital secondary to admission for pneumonia.  Two other  grandkids at home are also sick with cold.  She also states that she  has not been taking her HIV meds regularly over the past 2 months  secondary to increased stress at home with a recent addition of  grandchildren, daughter, and son-in-law who moved in.  She has also not  been taking any medications for her comorbid conditions including  coronary artery disease, hypertension, and hyperlipidemia.  She states  the only medication she takes regularly are Paxil 30 mg daily for her  depression as well as recently prescribed Percocet and Vicodin for  bilateral leg pain secondary to shingles and arthritis.   PHYSICAL EXAMINATION:  VITALS:  On admission include a temperature of  99.5, blood pressure of 119/79, heart rate 120, respiratory rate of 26,  O2 sats of 91 on room air, was subsequently increased to 97% and placed  on 2 L of oxygen.  GENERAL:  The patient was in no apparent distress, alert, oriented x3,  very pleasant and conversant.  HEENT:  Extraocular muscles intact.  Pupils equally round, reactive to  light and accommodation.  Sclerae anicteric.  Conjunctivae within normal  limits.  No palpable lymph nodes or masses.  Mucous drainage was present  in the posterior oropharynx, but no exudates or erythema were observed.  Trachea was midline.  No palpable thyromegaly.  RESPIRATORY:  Decreased breath sounds bilaterally without  rhonchi/wheezing/crackles.  CARDIOVASCULAR:  Distant heart sounds, clear S1, S2.  No murmurs, rubs,  gallops, or clicks.  GASTROINTESTINAL:  Abdomen was soft, nondistended, nontender to  palpation.  Bowel sounds were present x4.  Slightly hypoactive.  No  guarding, no rebound.  EXTREMITIES:  Warm and dry without cyanosis, clubbing, or edema.   GENITOURINARY:  No suprapubic pain.  No CVA tenderness.  SKIN:  No rashes, no lesion.  NEUROLOGIC:  No focal deficits.   LABORATORY DATA:  On admission as follows:  BNP 53, ABG with pH of 7.42,  PCO2 of 14, PaO2 of 107, bicarb of 26, O2 sat of 98%.  Basic metabolic  panel was as follows:  Sodium 135, potassium 3.9, chloride 104, CO2 of  26, BUN 6, creatinine 0.77, glucose 92.  CBC was as follows:  White  blood count 4.6 with ANC of 2.8, hemoglobin 12.1, hematocrit 38.3 with  MCV of 78.7, platelets 215.  Point-of-care cardiac enzymes showed  myoglobin of 256, CK-MB of 1.8 and troponin was less than 0.05.  LVH was  elevated at 319.   Chest x-ray was obtained which showed no acute changes or acute process.  Chest CT angiogram was performed to rule out PE.  Results were negative  for pulmonary embolism showing no lung masses, no aortic dilation or  aortic aneurysm.  However, there was some evidence of patchy airspace  disease in the right lower lobe and left upper lobe, possibly suggestive  of pneumonia.   HOSPITAL COURSE:  1. Lower respiratory tract infection/upper respiratory tract      infection.  The patient was started on Tamiflu 75 mg p.o. b.i.d.,      Rocephin 1 g [name] q.24 h., and Zithromax 500 mg [name] x1, then maintain      on 250 mg [name] daily.  Our primary concern was about possible flu as      well as community-acquired pneumonia.  [name] recent CD4      count as of July 2009 was 480.  So, we were not concerned about      opportunistic pathogens, frequently seen with HIV/AIDS patients.      [name] temperature curve continued to drop.  Rocephin was      discontinued on June 11, 2008, and azithromycin was changed from      [name] to p.o.  The patient continued to improve.  Sputum culture was      obtained.  Results are pending.  Blood cultures were obtained and      have remained with no growth to date throughout the course of her      hospitalization.  The patient  remained without any leukocytosis or      worsening of sings and symptoms.  Lungs remained clear to      auscultation.  She will be discharged to home with instructions to  continue her azithromycin for 4 more days and Tamiflu for 2 more      days.  She was also given instructions to follow up [facility] or return to the emergency room if her symptoms worsened.  2. Elevated creatinine kinase.  Given the patient's extensive history      of coronary artery disease, MI, and ischemia cardiomyopathy, serial      cardiac enzymes were obtained.  Over the course of 3 days, her      creatinine kinase was initially elevated at about 700, however,      continued to increase to 1027 on June 12, 2008.  CK-MB was      initially within normal limits.  However, increased to 4.6.      Relative index remained within normal limits, and troponin remained      at 0.02 with each sets of cardiac enzymes.  EKG was obtained to      rule out any possible cardiac etiology and showed evidence of old      infarct, but no acute infarct with her stable EKG and stable      troponin and relative index.  Her elevated CK greater than 1000 was      consistent with viral etiology.  The most likely causative factor      for her lower respiratory infection/upper respiratory infection.      We did check TSH as hypothyroidism can be a cause of elevated      creatinine kinase.  However, her TSH was within normal limits at      2.77.  She was not on any drugs that are likely to cause myositis,      and she was exhibiting no sings or symptoms of other      dermatomyositis or polymyositis.  3. HIV.  [name] last CD4 count was 480 in July 2009.  She      admits to not taking her Atripla over the last 2 months secondary      to increased stress at home.  HIV genotype was obtained with      results pending.  She will follow up regarding this with her ID      doctor, [name] to discuss reinitiation of  appropriate heart      therapy.  4. Coronary artery disease/ischemic cardiomyopathy.  The patient      remained stable without new onset chest pain or signs and symptoms      of acute MI throughout the course of her hospitalization.  Cardiac      enzymes, however, indicating increased creatinine kinase and CK-MB      were not consistent with cardiac etiology and EKG showed no acute      changes and evidence of old infarct only.  She has considered daily      vital signs.  She was maintained on her home medications of Coreg      as well as aspirin and Plavix while in the hospital and advised to      continue taking these medications on discharge to home.  She is      advised to follow up at Outpatient Clinic regarding the remainder      of her medications prescribed for her coronary artery disease as      well as hypertension and hyperlipidemia to assess appropriate      management at that time.  5. Hypertension.  [name] blood pressure  is stable, off her home      medications throughout the course of her hospitalization.  We did      place her on Coreg and advise her to continue taking this at home.      Her primary care [name] will be notified if she is not taking her      medication and the patient was advised to follow up regarding this      at the Outpatient Clinic.  6. Hyperlipidemia, this was considered in stable condition.  The      patient denies to continue simvastatin on discharge to home.  7. Obesity, this was in stable condition.  8. Arthritis/bursitis.  The patient was not complaining of any      symptoms during the course of her hospitalization.  Considered a      stable problem.  9. History of polysubstance abuse.  The patient has a history of      cocaine use, tobacco use, alcohol use, or marijuana use.  One of      her MIs was felt to be secondary to acute cocaine intoxication in      2008.  The patient states that she has been off drugs 2 years.      Urine  drug screen was obtained and was felt to be positive for THC      and opiates, negative for all others except for this.  Opiates were      tested to be positive as the patient came in with home prescription      for Percocet and Vicodin prescribed to her for chronic pain      secondary to her arthritis as well as her neuropathic pain      secondary to recurrent shingles in her bilateral lower extremities.   VITAL SIGNS ON THE DAY OF DISCHARGE:  Temperature 98.4, blood pressure  130/95, heart rate 93, respiratory rate 20, O2 sat 96% on room air.   CBC:  White blood count of 3.6, hemoglobin 10.6, hematocrit 33.0 with  MCV of 77.1, platelets 199.   Her last set of cardiac enzymes was as follows:  CK 1027, CK-MB 12.6,  relative index of 0.4, troponin 0.02.  Urine hCG was negative.  Blood  cultures from June 10, 2008, were no growth to date x2.  Sputum  culture and Gram stain results were obtained and results pending and HIV  genotype is currently pending.   Brooke Hayward, [name]     [name], M.D.  Electronically Signed      Brooke Charity, MD  Electronically Signed   CG/MEDQ  D:  06/12/2008  T:  06/13/2008  Job:  [id number]   cc:   Tresa Endo L. Philipp [name], M.D.

## 2011-01-19 NOTE — H&P (Signed)
[name], KETNER NO.:  [id number]   MEDICAL RECORD NO.:  [id number]          PATIENT TYPE:  IPS   LOCATION:  0300                          FACILITY:  [facility]   PHYSICIAN:  Jasmine Pang, M.D. DATE OF BIRTH:  09-Jan-1961   DATE OF ADMISSION:  09/15/2007  DATE OF DISCHARGE:                       PSYCHIATRIC ADMISSION ASSESSMENT   HISTORY OF PRESENT ILLNESS:  This is a 50 year old divorced African-  American female.  [name] actually presents to [place] coming over from  the main hospital.  She was admitted on 09/12/2007 due to chest pain in  the context of previously diagnosed coronary artery disease.  She stated  that she had been abstinent from illegal drugs since 06/2007, however,  she has been experiencing progressive depressed mood, anhedonia,  difficulty concentrating, thoughts of hopelessness, helplessness, some  insomnia as well as suicidal thoughts, moodiness and frequent crying.  She states that if it were not for her religious beliefs, she might have  committed suicide already.  The patient has had a prior anterior  infarction a prior total occlusion of a previously placed drug-eluting  stent in 06/2007.  She is also known to have adult immunodeficiency  syndrome disorder with recent increase in viral load.  She does have  coronary artery disease with an LAD stent placed in 2005 with a  subsequent anterior myocardial infarction associated with cocaine abuse  and stent thrombosis in 04/2007 treated  by [facility].  She had another non-  ST elevated MI in 05/2007.  A repeat catheterization showed that the LAD  was occluded, however, she had collaterals and she was placed on medical  therapy.  All of these interventions and catheterizations occurred at  [facility] or at [facility].   This admission, she was medically cleared.  She did not have any  elevation of her troponin and is doing well on her current cardiac  medication.   PAST PSYCHIATRIC HISTORY:  In  1994, she was admitted in [place].  She had a newborn to die within 24 hours after birth.  This is a brief  hospitalization and she had no outpatient follow up.  She denies any  other in or outpatient psychiatric care.   SOCIAL HISTORY:  She was a high school gradate in 22.  She has been  married and divorced once.  Her oldest is a girl, 6.  Her next child is  an adopted daughter age 74, a daughter 84, a son 31, a daughter 56 and  twin boys age 57.  She has not been employed in the past years.  Prior  to that, she was a Microbiologist.   FAMILY HISTORY:  She states her maternal aunt had depression.  Alcohol  and drug history she denies.  Her primary care primary is Dr. Philipp Deputy  who also follows her for her HIV.  She is followed cardiac wise by Dr.  Viann Fish.   PAST MEDICAL HISTORY:  She has hypertension, hyperlipidemia, HIV with a  recent increase in viral load.  She has coronary artery disease with  prior anterior infarction and prior total occlusion of a previously  placed drug-eluting stent in 06/2007.  She is recently in sustained  remission from polysubstance abuse.   MEDICATIONS:  She is currently prescribed Paroxetine 20 mg p.o. day,  lisinopril 10 mg p.o. daily, simvastatin 20 mg p.o. daily, furosemide 10  mg p.o. daily, carvedilol 6.25 mg b.i.d., Plavix 75 mg daily, aspirin 81  mg daily, Valtrex 500 mg b.i.d., Trizivir one tablet b.i.d., and  isosorbide mononitrate 60 mg in the morning.  She has been on the Paxil  for a month or less and she was seen in consultation while hospitalized  by Dr. Jeanie Sewer who recommends increasing the Paxil to 30 mg.   ALLERGIES:  She states that Lipitor messed with her memory.   PHYSICAL EXAMINATION:  Her complete history and physical is well  documented and on the chart.  Upon her admission here to the unit, her  vital signs show that she is 62.5 inches tall, her weight is 228,  temperature is 98.2, blood pressure 92/62,  pulse 69, and respirations  are 16.  She is status post a cholecystectomy, a C-section and two  stents.  Her last MI was 06/16/2007 and she is HIV positive.   MENTAL STATUS EXAM:  Tonight, she is alert and oriented.  She is  appropriately groomed, dressed and nourished.  She is somewhat casually  presented.  Her speech is normal rate rhythm and tone.  Her mood is  somewhat labile.  She cries easily.  Her thought processes are clear,  rational and goal oriented.  She wants to get better.  Judgment and  insight are going.  Concentration and memory are good.  Intelligence is  at least average.  She states that she has continued to have suicidal  ideation this past week.  She denies any homicidal ideation.  She denies  any auditory hallucinations, but she see shadows out the corner of her  eye.   ADMISSION DIAGNOSES:  AXIS I:  Mood disorder not otherwise specified,  clean and sober since 06/2007.  AXIS II:  Deferred.  AXIS III:  1.  Human immunodeficiency virus syndrome with recent  increase in viral load.  1. Hypertension.  2. Hyperlipidemia.  3. Status post anterior myocardial infarction with a prior total      occlusion of a previously placed drug-eluting stent in 06/2007.  AXIS IV:  Severe, problems with primary support group.  AXIS V:  GAF is 40.   PLAN:  The plan is to admit for safety and stabilization.  We will  adjust her medications.  Toward that end, we will add some Neurontin to  help with her anxiety and to help her with sleep.  We will increase her  Paxil as indicated by Dr. Providence Crosby notes.  She is already a patient  for [facility] and we can have a family planning session  regarding discharge, although she does feel that she will be able to  return to her prior hospitalization placement with her daughter.  Estimated length of stay is 3-4 days.      Mickie Leonarda Salon, P.A.-C.      Jasmine Pang, M.D.  Electronically Signed    MD/MEDQ  D:   09/15/2007  T:  09/16/2007  Job:  [id number]

## 2011-01-20 ENCOUNTER — Emergency Department (HOSPITAL_COMMUNITY)
Admission: EM | Admit: 2011-01-20 | Discharge: 2011-01-21 | Disposition: A | Discharge status: Other Acute Inpatient Hospital | Payer: Medicare PPO | Attending: Emergency Medicine | Admitting: Emergency Medicine

## 2011-01-20 ENCOUNTER — Emergency Department (HOSPITAL_COMMUNITY): Payer: Medicare PPO

## 2011-01-20 DIAGNOSIS — I2510 Atherosclerotic heart disease of native coronary artery without angina pectoris: Secondary | ICD-10-CM | POA: Insufficient documentation

## 2011-01-20 DIAGNOSIS — F17200 Nicotine dependence, unspecified, uncomplicated: Secondary | ICD-10-CM | POA: Insufficient documentation

## 2011-01-20 DIAGNOSIS — I252 Old myocardial infarction: Secondary | ICD-10-CM | POA: Insufficient documentation

## 2011-01-20 DIAGNOSIS — F1910 Other psychoactive substance abuse, uncomplicated: Secondary | ICD-10-CM | POA: Insufficient documentation

## 2011-01-20 DIAGNOSIS — Z046 Encounter for general psychiatric examination, requested by authority: Secondary | ICD-10-CM | POA: Insufficient documentation

## 2011-01-20 DIAGNOSIS — R079 Chest pain, unspecified: Secondary | ICD-10-CM | POA: Insufficient documentation

## 2011-01-20 DIAGNOSIS — I10 Essential (primary) hypertension: Secondary | ICD-10-CM | POA: Insufficient documentation

## 2011-01-20 DIAGNOSIS — B20 Human immunodeficiency virus [HIV] disease: Secondary | ICD-10-CM | POA: Insufficient documentation

## 2011-01-20 LAB — COMPREHENSIVE METABOLIC PANEL
ALT: 14 U/L (ref 0–35)
AST: 16 U/L (ref 0–37)
Albumin: 4.4 g/dL (ref 3.5–5.2)
Chloride: 95 mEq/L — ABNORMAL LOW (ref 96–112)
Creatinine, Ser: 0.72 mg/dL (ref 0.4–1.2)
GFR calc Af Amer: >60 mL/min (ref >60)
Sodium: 134 mEq/L — ABNORMAL LOW (ref 135–145)
Total Bilirubin: 0.7 mg/dL (ref 0.3–1.2)

## 2011-01-20 LAB — RAPID URINE DRUG SCREEN, HOSP PERFORMED
Amphetamines: NOT DETECTED
Barbiturates: NOT DETECTED
Benzodiazepines: NOT DETECTED
Cocaine: POSITIVE — AB
Opiates: NOT DETECTED

## 2011-01-20 LAB — DIFFERENTIAL
Basophils Absolute: 0 10*3/uL (ref 0.0–0.1)
Basophils Relative: 0 % (ref 0–1)
Eosinophils Absolute: 0 10*3/uL (ref 0.0–0.7)
Neutro Abs: 8.5 10*3/uL — ABNORMAL HIGH (ref 1.7–7.7)
Neutrophils Relative %: 76 % (ref 43–77)

## 2011-01-20 LAB — POCT CARDIAC MARKERS
CKMB, poc: 1.1 ng/mL (ref 1.0–8.0)
Myoglobin, poc: 77.3 ng/mL (ref 12–200)
Myoglobin, poc: 72.5 ng/mL (ref 12–200)

## 2011-01-20 LAB — CBC
Hemoglobin: 13.7 g/dL (ref 12.0–15.0)
MCH: 26.6 pg (ref 26.0–34.0)
Platelets: 271 10*3/uL (ref 150–400)
RBC: 5.15 MIL/uL — ABNORMAL HIGH (ref 3.87–5.11)
WBC: 11.3 10*3/uL — ABNORMAL HIGH (ref 4.0–10.5)

## 2011-01-21 ENCOUNTER — Inpatient Hospital Stay (HOSPITAL_COMMUNITY)
Admission: RE | Admit: 2011-01-21 | Discharge: 2011-01-22 | DRG: 897 | Disposition: A | Discharge status: Home / Self Care | Payer: Medicare PPO | Source: Ambulatory Visit | Attending: Psychiatry | Admitting: Psychiatry

## 2011-01-21 DIAGNOSIS — I10 Essential (primary) hypertension: Secondary | ICD-10-CM | POA: Diagnosis present

## 2011-01-21 DIAGNOSIS — F329 Major depressive disorder, single episode, unspecified: Secondary | ICD-10-CM | POA: Diagnosis present

## 2011-01-21 DIAGNOSIS — F1920 Other psychoactive substance dependence, uncomplicated: Secondary | ICD-10-CM

## 2011-01-21 DIAGNOSIS — Z7982 Long term (current) use of aspirin: Secondary | ICD-10-CM

## 2011-01-21 DIAGNOSIS — I2510 Atherosclerotic heart disease of native coronary artery without angina pectoris: Secondary | ICD-10-CM | POA: Diagnosis present

## 2011-01-21 DIAGNOSIS — F3289 Other specified depressive episodes: Secondary | ICD-10-CM | POA: Diagnosis present

## 2011-01-21 DIAGNOSIS — E785 Hyperlipidemia, unspecified: Secondary | ICD-10-CM | POA: Diagnosis present

## 2011-01-21 DIAGNOSIS — Z21 Asymptomatic human immunodeficiency virus [HIV] infection status: Secondary | ICD-10-CM | POA: Diagnosis present

## 2011-01-21 DIAGNOSIS — E739 Lactose intolerance, unspecified: Secondary | ICD-10-CM | POA: Diagnosis present

## 2011-01-21 DIAGNOSIS — I252 Old myocardial infarction: Secondary | ICD-10-CM

## 2011-01-21 DIAGNOSIS — F411 Generalized anxiety disorder: Secondary | ICD-10-CM | POA: Diagnosis present

## 2011-01-21 DIAGNOSIS — Z91199 Patient's noncompliance with other medical treatment and regimen due to unspecified reason: Secondary | ICD-10-CM

## 2011-01-21 DIAGNOSIS — Z9119 Patient's noncompliance with other medical treatment and regimen: Secondary | ICD-10-CM

## 2011-01-22 ENCOUNTER — Telehealth: Payer: Self-pay | Admitting: Licensed Clinical Social Worker

## 2011-01-25 NOTE — Consult Note (Signed)
[name], [name]              ACCOUNT NO.:  [id number]  MEDICAL RECORD NO.:  [id number]           PATIENT TYPE:  I  LOCATION:  2920                         FACILITY:  [facility]  PHYSICIAN:  Georga Hacking, M.D.DATE OF BIRTH:  August 08, 1961  DATE OF CONSULTATION:  12/23/2010 DATE OF DISCHARGE:                                CONSULTATION   REASON FOR CONSULTATION:  Chest pain.  HISTORY OF PRESENT ILLNESS:  The patient is a 50 year old black female with a prior cardiac history.  The patient has a previous history of HIV positivity.  Had a previous stent placed LAD in 2005 with subsequent anterior MI treated with cocaine abuse, stent thrombosis in August 2008. In September 2000, another STEMI was seen at [facility], at which point all these previous catheterizations and interventions had occurred.  At that time, she had occluded her LAD and had a well-developed systemic collaterals to this area and no disease in the other vessels.  She was recommended for medical treatment and relocated to [facility].  She was seen initially at [facility] in October 2008 and was last seen in my office in 2009.  A thorough consultation in September 2010 at which point in time, she was having some atypical chest and shoulder pain as well as left arm pain.  A Cardiolite study at that time did not show any large areas of ischemia, although there was a question of peri-infarct ischemia in the anterolateral wall.  She does have a known LAD occlusion.  Since that time, she has been lost to follow up here and relapsed with cocaine abuse in June 2011.  Since that time, she has had no medical followup for cardiac and quit taking all of her cardiac medicines because she did not wish to pay the copayment for them.  She has continued to use her HIV medicines and continues to use marijuana, although she states that she has been free of cocaine.  She was admitted to the hospital with pleuritic-type chest pain that  started yesterday. The pain would be worse when she would take a deep breath and did not radiate anywhere, was in the mid part of her chest.  She had several episodes over the days prior to admission and was brought to the emergency room.  She has had EKGs done repeatedly that have shown differences in the R-wave voltage as well as the T-waves laterally.  Her troponins have all been negative.  She has not had typical ischemic-type chest pain.  PAST MEDICAL HISTORY:  Remarkable for: 1. Coronary artery disease with occlusion of the LAD, EF is about 35-     40%. 2. History of HIV since 1990. 3. Anxiety and depression. 4. Hypertension. 5. Hyperlipidemia. 6. Morbid obesity.  PAST SURGICAL HISTORY: 1. Cholecystectomy. 2. Two C-sections. 3. Previous knee surgery. 4. Eye surgery. 5. Knife injury.  MEDICATIONS:  See the orders.  ALLERGIES:  She says that LIPITOR causes forgetfulness.  FAMILY HISTORY:  Is in old records and is unchanged.  SOCIAL HISTORY:  She is divorced, has 2 children who lives with her. She says that she is not currently smoking.  She uses marijuana, but no cocaine at the present time, occasional alcohol use.  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  REVIEW OF SYSTEMS:  Continued significant weight gain, situational stress, depression, anxiety.  No GI bleeding or GI complaints.  No urinary symptoms.  Arthritis involving her right knee.  Other than as noted above, the remainder review of systems is unremarkable.  PHYSICAL EXAMINATION:  GENERAL:  She is an obese black female, in no acute distress currently. VITAL SIGNS:  Blood pressure is 130/80, pulse is 70 and regular. SKIN:  Warm and dry. ENT:  EOMI.  PERRLA. CNS:  Clear.  Fundi not examined. PHARYNX:  Negative. NECK:  Supple.  No masses, JVD, thyromegaly, or bruits. LUNGS:  Clear to A and P. CARDIAC:  Normal S1 and S2.  No S3.  No murmur. ABDOMEN:  Obese, soft, and nontender. EXTREMITIES:  Femoral distal pulses  are 2+.  There is no edema noted.  LABORATORY DATA:  A 12-lead EKG shows previous anterior infarction with T-wave changes in the anterolateral leads.  These appear to fluctuate some since admission and previously she has had fluctuation of R waves from when she has been admitted also.  Her hemoglobin A1c is 5.9. Laboratory data shows white count of 3200, potassium is 3.4.  CPK and troponins were normal.  Cholesterol is 188 with a triglyceride of 102, HDL of 46, and LDL of 122.  TSH is 1.315.  Echocardiogram shows anterolateral akinesis and anteroseptal akinesis with ejection fraction of around 35-40%, mild mitral regurgitation, trace aortic regurgitation, mild tricuspid regurgitation.  IMPRESSION: 1. Somewhat atypical chest pain.  It was pleuritic and really did not     sound ischemic.  She has negative cardiovascular enzymes and EKG     changes.  I would attribute these changes in lead position.  I     thought the pain was rather atypical and did not sound cardiac to     me. 2. Coronary artery disease with previous left anterior descending     occlusion in the setting of cocaine abuse and medical     noncompliance. 3. Obesity. 4. Glucose intolerance. 5. Hypertension. 6. Hyperlipidemia. 7. Medical noncompliance. 8. History of substance abuse with cocaine previously, none on drug     screen at the present time, current substance abuse is marijuana. 9. Anxiety and depression.  RECOMMENDATIONS:  Restart all of her cardiac medications.  Compliance has been enormous issue in her case in the past.  I would not consider her to be a candidate for stenting in the future due to unreliability taking antiplatelet agents.  Thus, options for treatment of her condition would include bypass grafting.  Indications for bypass grafting would include a more refractory presentation of chest pain that was more typical of ischemia accompanied with objective evidence of ischemia, either on  perfusion myocardial imaging or with positive cardiac enzymes.  I would restart her cardiac medications and would check another 2-day Lexiscan study. Obviously has a way in terms of risk factor modification as her medical insight into her condition is extremely poor.     Georga Hacking, M.D.     WST/MEDQ  D:  12/23/2010  T:  12/23/2010  Job:  [id number]  cc:   Fransisco Hertz, M.D.  Electronically Signed by Lacretia Nicks. Donnie Aho M.D. on 01/25/2011 01:18:15 AM

## 2011-01-26 ENCOUNTER — Telehealth: Payer: Self-pay | Admitting: Licensed Clinical Social Worker

## 2011-01-26 NOTE — Telephone Encounter (Signed)
Patient would like a refill for trazodone until she gets in to see a pcp at [place].

## 2011-01-27 ENCOUNTER — Other Ambulatory Visit: Payer: Self-pay | Admitting: *Deleted

## 2011-01-27 NOTE — Telephone Encounter (Signed)
[name], it's okay to go ahead and refill her trazodone. Thanks

## 2011-01-28 ENCOUNTER — Telehealth: Payer: Self-pay | Admitting: Licensed Clinical Social Worker

## 2011-01-28 DIAGNOSIS — G4700 Insomnia, unspecified: Secondary | ICD-10-CM

## 2011-01-28 MED ORDER — TRAZODONE HCL 50 MG PO TABS: 50.0000 mg | ORAL_TABLET | Freq: Every day | ORAL | Status: DC

## 2011-01-28 NOTE — Telephone Encounter (Signed)
Patient notified of prescription being called in at [facility] on [address].

## 2011-02-02 ENCOUNTER — Encounter (HOSPITAL_BASED_OUTPATIENT_CLINIC_OR_DEPARTMENT_OTHER)
Admission: RE | Admit: 2011-02-02 | Discharge: 2011-02-02 | Disposition: A | Discharge status: Home / Self Care | Payer: Medicare PPO | Source: Ambulatory Visit | Attending: Orthopedic Surgery | Admitting: Orthopedic Surgery

## 2011-02-02 LAB — BASIC METABOLIC PANEL
Calcium: 9.2 mg/dL (ref 8.4–10.5)
Chloride: 105 mEq/L (ref 96–112)
Creatinine, Ser: 0.64 mg/dL (ref 0.4–1.2)
GFR calc Af Amer: >60 mL/min (ref >60)
Sodium: 141 mEq/L (ref 135–145)

## 2011-02-02 LAB — HEPATIC FUNCTION PANEL
Alkaline Phosphatase: 55 U/L (ref 39–117)
Indirect Bilirubin: 0.3 mg/dL (ref 0.3–0.9)
Total Protein: 7.6 g/dL (ref 6.0–8.3)

## 2011-02-04 ENCOUNTER — Ambulatory Visit (HOSPITAL_BASED_OUTPATIENT_CLINIC_OR_DEPARTMENT_OTHER)
Admission: RE | Admit: 2011-02-04 | Discharge: 2011-02-04 | Disposition: A | Discharge status: Home / Self Care | Payer: Medicare PPO | Source: Ambulatory Visit | Attending: Orthopedic Surgery | Admitting: Orthopedic Surgery

## 2011-02-04 DIAGNOSIS — E669 Obesity, unspecified: Secondary | ICD-10-CM | POA: Insufficient documentation

## 2011-02-04 DIAGNOSIS — M23349 Other meniscus derangements, anterior horn of lateral meniscus, unspecified knee: Secondary | ICD-10-CM | POA: Insufficient documentation

## 2011-02-04 DIAGNOSIS — Z01812 Encounter for preprocedural laboratory examination: Secondary | ICD-10-CM | POA: Insufficient documentation

## 2011-02-04 DIAGNOSIS — Z9861 Coronary angioplasty status: Secondary | ICD-10-CM | POA: Insufficient documentation

## 2011-02-04 DIAGNOSIS — I2510 Atherosclerotic heart disease of native coronary artery without angina pectoris: Secondary | ICD-10-CM | POA: Insufficient documentation

## 2011-02-04 DIAGNOSIS — M1710 Unilateral primary osteoarthritis, unspecified knee: Secondary | ICD-10-CM | POA: Insufficient documentation

## 2011-02-04 LAB — POCT HEMOGLOBIN-HEMACUE: Hemoglobin: 11.6 g/dL — ABNORMAL LOW (ref 12.0–15.0)

## 2011-02-09 ENCOUNTER — Inpatient Hospital Stay (INDEPENDENT_AMBULATORY_CARE_PROVIDER_SITE_OTHER)
Admission: RE | Admit: 2011-02-09 | Discharge: 2011-02-09 | Disposition: A | Discharge status: Home / Self Care | Payer: Medicare PPO | Source: Ambulatory Visit | Attending: Family Medicine | Admitting: Family Medicine

## 2011-02-09 ENCOUNTER — Ambulatory Visit (INDEPENDENT_AMBULATORY_CARE_PROVIDER_SITE_OTHER): Payer: Medicare PPO

## 2011-02-09 DIAGNOSIS — S7000XA Contusion of unspecified hip, initial encounter: Secondary | ICD-10-CM

## 2011-02-09 DIAGNOSIS — M79609 Pain in unspecified limb: Secondary | ICD-10-CM

## 2011-02-12 ENCOUNTER — Emergency Department (HOSPITAL_COMMUNITY)
Admission: EM | Admit: 2011-02-12 | Discharge: 2011-02-12 | Disposition: A | Discharge status: Home / Self Care | Payer: Medicare PPO | Attending: Emergency Medicine | Admitting: Emergency Medicine

## 2011-02-12 DIAGNOSIS — I252 Old myocardial infarction: Secondary | ICD-10-CM | POA: Insufficient documentation

## 2011-02-12 DIAGNOSIS — M129 Arthropathy, unspecified: Secondary | ICD-10-CM | POA: Insufficient documentation

## 2011-02-12 DIAGNOSIS — E7800 Pure hypercholesterolemia, unspecified: Secondary | ICD-10-CM | POA: Insufficient documentation

## 2011-02-12 DIAGNOSIS — Z9889 Other specified postprocedural states: Secondary | ICD-10-CM | POA: Insufficient documentation

## 2011-02-12 DIAGNOSIS — B029 Zoster without complications: Secondary | ICD-10-CM | POA: Insufficient documentation

## 2011-02-12 DIAGNOSIS — Z79899 Other long term (current) drug therapy: Secondary | ICD-10-CM | POA: Insufficient documentation

## 2011-02-12 DIAGNOSIS — L299 Pruritus, unspecified: Secondary | ICD-10-CM | POA: Insufficient documentation

## 2011-02-12 DIAGNOSIS — F17200 Nicotine dependence, unspecified, uncomplicated: Secondary | ICD-10-CM | POA: Insufficient documentation

## 2011-02-12 DIAGNOSIS — M25469 Effusion, unspecified knee: Secondary | ICD-10-CM | POA: Insufficient documentation

## 2011-02-12 DIAGNOSIS — M79609 Pain in unspecified limb: Secondary | ICD-10-CM | POA: Insufficient documentation

## 2011-02-12 DIAGNOSIS — I10 Essential (primary) hypertension: Secondary | ICD-10-CM | POA: Insufficient documentation

## 2011-02-12 DIAGNOSIS — I2510 Atherosclerotic heart disease of native coronary artery without angina pectoris: Secondary | ICD-10-CM | POA: Insufficient documentation

## 2011-02-12 DIAGNOSIS — Z21 Asymptomatic human immunodeficiency virus [HIV] infection status: Secondary | ICD-10-CM | POA: Insufficient documentation

## 2011-03-02 NOTE — Telephone Encounter (Signed)
Orders
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
## 2011-03-30 NOTE — Op Note (Signed)
NAMETYSON, Brooke NO.:  [id number]  MEDICAL RECORD NO.:  [id number]           PATIENT TYPE:  LOCATION:                                 FACILITY:  PHYSICIAN:  Jones Broom, MD    DATE OF BIRTH:  [birth year]  DATE OF PROCEDURE:  02/04/2011 DATE OF DISCHARGE:                              OPERATIVE REPORT   PREOPERATIVE DIAGNOSES:  Right knee lateral meniscus tear and moderate tricompartmental osteoarthritis.  POSTOPERATIVE DIAGNOSES: 1. Right knee anterior complex lateral meniscus tear. 2. 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  PROCEDURES PERFORMED: 1. Right knee arthroscopic partial lateral meniscectomy. 2. Right knee abrasion arthroplasty of trochlea, lateral femoral     condyle and medial and lateral tibial plateau chondromalacia.  ATTENDING SURGEON:  Jones Broom, MD  ASSISTANT:  None.  ANESTHESIA:  GETA.  COMPLICATIONS:  None.  DRAINS:  None.  SPECIMENS:  None.  ESTIMATED BLOOD LOSS:  Minimal.  INDICATIONS FOR SURGERY:  The patient is a 50 year old female who has had a long history of bilateral knee problems.  She has complained of right knee pain over the last several months.  She has had increasing catching and locking in the knee, requiring gentle manipulation to straighten it.  She has had several injections into the knee, which have had temporary, but not lasting relief.  MRI demonstrated complex tear of the anterior horn of the lateral meniscus with some diffuse degenerative changes.  She was indicated for operative treatment for lateral meniscectomy to stop the mechanical symptoms and decrease her pain.  She understood risks, benefits, and alternatives to the procedure including but not limited to risk of bleeding, infection, damage to neurovascular structures, risk of incomplete pain relief, and DVT.  She  understood all of this and elected to go forward with surgery.  OPERATIVE FINDINGS:  Examination under anesthesia demonstrated no instability or loss of motion.  Diagnostic arthroscopy revealed some diffuse chondromalacia with grade III changes on the femoral trochlea.  There was grade 3 chondromalacia on the lateral femoral condyle with grade 2 changes on medial and lateral tibial plateaus.  The shaver was used to abrade and debride these surfaces down to the stable base.  She had a complex tear of the anterior horn of the lateral meniscus, which was flipping in and out of the notch and involved approximately 60-70% in the anterior horn.  Using combination of arthroscopic biter and shaver, this was debrided back to the stable base.  There was some fraying of the posterior horn of the lateral meniscus, which was debrided, but no frank tear.  Medial meniscus was intact.  ACL and PCL were intact.  PROCEDURE:  The patient was identified in the preoperative holding area where I personally marked the operative site after verifying site, side and procedure with the patient.  She was taken back to the operating room where general anesthesia was induced without complication.  She was in the supine position.  The right lower extremity was placed in  a leg holder and the left lower extremity was well padded.  The foot of the bed was dropped.  After the appropriate time-out procedure, the lateral portal was established with an 11-blade.  The arthroscope was introduced into the joint.  The patient did receive IV antibiotics prior to the incision.  Diagnostic arthroscopy was then carried out with findings as described above.  The standard medial portal was established under direct visualization with needle localization above the anterior horn of the medial meniscus.  Diagnostic arthroscopy findings were described above.  The shaver was introduced into the joint and used to debride and abrade the  articular surfaces of the trochlea, lateral femoral condyle and medial and lateral tibial plateaus to stable articular base with no flaps.  There was a small area of the trochlea that have some exposed bone.  The remainder had some cartilage remaining, although it was quite thin on the lateral femoral condyle.  The lateral compartment was examined and she was noted to have a large flap tear of the anterior horn of the lateral meniscus which was slipping in an out of the notch. This flap measured approximately 1 cm in length and about 8 mm in width. It was debrided using the shaver back to the stable base.  The attachment of the anterior meniscus remained intact.  Using combination of shaver and biter, the anterior horn meniscus was debrided back to the stable base, which was in continuity with the body of the meniscus.  The posterior meniscus was noted to have some fraying, which was debrided, but no frank tear.  No loose bodies were noted.  The arthroscope was then removed from the joint and the portals were closed with 3-0 nylon in interrupted fashion.  Sterile dressings were then applied including Adaptic, 4x4s, ABDs, sterile Webril, and a 6-inch Ace bandage.  The patient was then allowed to awaken from general anesthesia, transferred to the stretcher and taken to the recovery room in stable condition.  POSTOPERATIVE PLAN:  She will be discharged home today with her family. She will have Percocet for pain control and will follow up in 1 week for suture removal and wound check.     Jones Broom, MD     JC/MEDQ  D:  02/04/2011  T:  02/05/2011  Job:  [id number]  Electronically Signed by Jones Broom  on 03/30/2011 03:54:39 PM

## 2011-04-27 ENCOUNTER — Encounter: Payer: Self-pay | Admitting: Licensed Clinical Social Worker

## 2011-04-27 ENCOUNTER — Other Ambulatory Visit: Payer: Self-pay | Admitting: Licensed Clinical Social Worker

## 2011-04-27 ENCOUNTER — Telehealth: Payer: Self-pay | Admitting: Licensed Clinical Social Worker

## 2011-04-27 DIAGNOSIS — M1990 Unspecified osteoarthritis, unspecified site: Secondary | ICD-10-CM

## 2011-04-27 MED ORDER — OXYCODONE-ACETAMINOPHEN 10-325 MG PO TABS: 1.0000 | ORAL_TABLET | Freq: Three times a day (TID) | ORAL | Status: DC | PRN

## 2011-04-27 NOTE — Telephone Encounter (Signed)
Patient called wanting a 30 day supply for pain medication until she goes to see Dr. Philipp Deputy next month and also wants a letter stating that she can't walk a long distance at school and has an illness that cause her fatigue. She has to take this letter to school for a special parking place in addition to her handicap sticker she already has through the [facility].

## 2011-04-27 NOTE — Telephone Encounter (Signed)
Okay to order one additional month supply of pain medication provided. She is following up with Dr. Philipp Deputy. According to ordering notes, she can have 25 Percocets. Also, it is okay to write, whatever note, she wants regarding her inability to walk distances. Thanks

## 2011-04-29 ENCOUNTER — Encounter: Payer: Self-pay | Admitting: *Deleted

## 2011-05-05 ENCOUNTER — Other Ambulatory Visit (INDEPENDENT_AMBULATORY_CARE_PROVIDER_SITE_OTHER): Payer: Medicare PPO

## 2011-05-05 DIAGNOSIS — B20 Human immunodeficiency virus [HIV] disease: Secondary | ICD-10-CM

## 2011-05-06 LAB — COMPLETE METABOLIC PANEL WITH GFR
ALT: 17 U/L (ref 0–35)
AST: 14 U/L (ref 0–37)
Albumin: 4.3 g/dL (ref 3.5–5.2)
Calcium: 9.3 mg/dL (ref 8.4–10.5)
Chloride: 106 mEq/L (ref 96–112)
Potassium: 4.3 mEq/L (ref 3.5–5.3)
Sodium: 142 mEq/L (ref 135–145)
Total Protein: 6.7 g/dL (ref 6.0–8.3)

## 2011-05-06 LAB — CBC WITH DIFFERENTIAL/PLATELET
Basophils Absolute: 0 10*3/uL (ref 0.0–0.1)
Lymphocytes Relative: 24 % (ref 12–46)
Neutro Abs: 6 10*3/uL (ref 1.7–7.7)
Platelets: 248 10*3/uL (ref 150–400)
RBC: 4.33 MIL/uL (ref 3.87–5.11)
RDW: 15.7 % — ABNORMAL HIGH (ref 11.5–15.5)
WBC: 9 10*3/uL (ref 4.0–10.5)

## 2011-05-06 LAB — HIV-1 RNA QUANT-NO REFLEX-BLD: HIV-1 RNA Quant, Log: <1.3 {Log} (ref <1.30)

## 2011-05-19 ENCOUNTER — Ambulatory Visit: Payer: Medicare PPO | Admitting: Adult Health

## 2011-05-20 ENCOUNTER — Encounter: Payer: Self-pay | Admitting: Adult Health

## 2011-05-20 ENCOUNTER — Ambulatory Visit (INDEPENDENT_AMBULATORY_CARE_PROVIDER_SITE_OTHER): Payer: Medicare PPO | Admitting: Adult Health

## 2011-05-20 VITALS — BP 139/82 | HR 67 | Temp 97.9°F | Ht 61.5 in | Wt 253.8 lb

## 2011-05-20 DIAGNOSIS — Z23 Encounter for immunization: Secondary | ICD-10-CM

## 2011-05-20 DIAGNOSIS — N946 Dysmenorrhea, unspecified: Secondary | ICD-10-CM

## 2011-05-20 DIAGNOSIS — Z0000 Encounter for general adult medical examination without abnormal findings: Secondary | ICD-10-CM

## 2011-05-20 DIAGNOSIS — B20 Human immunodeficiency virus [HIV] disease: Secondary | ICD-10-CM

## 2011-05-20 NOTE — Patient Instructions (Signed)
1. Go directly to [facility] after clinic today. 2. Schedule a four-month clinic followup visit, and have labs drawn 2 weeks before her scheduled appointment. 3. Continue followup with your PCP.

## 2011-05-21 ENCOUNTER — Inpatient Hospital Stay (HOSPITAL_COMMUNITY)
Admission: AD | Admit: 2011-05-21 | Discharge: 2011-05-21 | Disposition: A | Discharge status: Home / Self Care | Payer: Medicare PPO | Source: Ambulatory Visit | Attending: Obstetrics and Gynecology | Admitting: Obstetrics and Gynecology

## 2011-05-21 ENCOUNTER — Inpatient Hospital Stay (HOSPITAL_COMMUNITY): Payer: Medicare PPO

## 2011-05-21 ENCOUNTER — Encounter (HOSPITAL_COMMUNITY): Payer: Self-pay | Admitting: *Deleted

## 2011-05-21 DIAGNOSIS — R109 Unspecified abdominal pain: Secondary | ICD-10-CM | POA: Insufficient documentation

## 2011-05-21 DIAGNOSIS — D259 Leiomyoma of uterus, unspecified: Secondary | ICD-10-CM | POA: Insufficient documentation

## 2011-05-21 DIAGNOSIS — N949 Unspecified condition associated with female genital organs and menstrual cycle: Secondary | ICD-10-CM | POA: Insufficient documentation

## 2011-05-21 DIAGNOSIS — N938 Other specified abnormal uterine and vaginal bleeding: Secondary | ICD-10-CM | POA: Insufficient documentation

## 2011-05-21 DIAGNOSIS — N888 Other specified noninflammatory disorders of cervix uteri: Secondary | ICD-10-CM

## 2011-05-21 HISTORY — DX: Pure hypercholesterolemia, unspecified: E78.00

## 2011-05-21 HISTORY — DX: Cholecystitis, unspecified: K81.9

## 2011-05-21 HISTORY — DX: Acute myocardial infarction, unspecified: I21.9

## 2011-05-21 LAB — WET PREP, GENITAL
Clue Cells Wet Prep HPF POC: NONE SEEN
Trich, Wet Prep: NONE SEEN

## 2011-05-21 LAB — CBC
Hemoglobin: 11.4 g/dL — ABNORMAL LOW (ref 12.0–15.0)
MCH: 26.4 pg (ref 26.0–34.0)
MCHC: 31.1 g/dL (ref 30.0–36.0)

## 2011-05-21 LAB — TSH: TSH: 1.483 u[IU]/mL (ref 0.350–4.500)

## 2011-05-21 MED ORDER — KETOROLAC TROMETHAMINE 60 MG/2ML IM SOLN
60.0000 mg | Freq: Once | INTRAMUSCULAR | Status: AC
  Administered 2011-05-21: 60 mg via INTRAMUSCULAR
  Filled 2011-05-21: qty 2

## 2011-05-21 MED ORDER — NAPROXEN 500 MG PO TABS: 500.0000 mg | ORAL_TABLET | Freq: Two times a day (BID) | ORAL | Status: DC

## 2011-05-21 NOTE — Progress Notes (Signed)
Pt states, " I haven't had a period in 2 yrs, then last Friday morning I saw spotting and then by 1200, I had to put on a pad and I 've been bleeding ever since. I started having cramping in my low abdomen and Monday. The bleeding got heavy on Monday also. I use about 7 pads in 24 hrs. Last night I started passing nickle size clots., and the pain and cramping started going into my thighs and I got where I couldn't stand up straight."

## 2011-05-21 NOTE — Progress Notes (Signed)
Dr Adrian Blackwater in. Spec exam done and wet prep and GC/Chlam obtained. Pt tol well.

## 2011-05-21 NOTE — ED Provider Notes (Signed)
History     Chief Complaint  Patient presents with  . Abdominal Pain  . Vaginal Bleeding   HPI Patient is a 50yo female who presents with vaginal bleeding x 15 days.  Previous to this, she didn't have a period for 2 years.  She states that she is post menopausal.  She has not seen her primary doctor for this.  She states that the vaginal bleeding is heavy and she is going through several heavy pads a day.  She does have abd cramping.  She denies fevers, chills, nausea, vomiting, diarrhea.     Past Medical History  Diagnosis Date  . Myocardial infarction   . Cholecystitis     Gall bladder removed   . Hypercholesterolemia     Past Surgical History  Procedure Date  . Cesarean section   . Cardiac catheterization     Stint X 2    No family history on file.  History  Substance Use Topics  . Smoking status: Former Smoker    Quit date: 04/25/2010  . Smokeless tobacco: Never Used  . Alcohol Use: 1.0 oz/week    2 drink(s) per week     wine    Allergies:  Allergies  Allergen Reactions  . Atorvastatin     Prescriptions prior to admission  Medication Sig Dispense Refill  . aspirin 81 MG tablet Take 81 mg by mouth daily.        . carvedilol (COREG) 3.125 MG tablet Take 3.125 mg by mouth 2 (two) times daily with a meal.        . darunavir (PREZISTA) 400 MG tablet Take 800 mg by mouth.        Marland Kitchen emtricitabine-tenofovir (TRUVADA) 200-300 MG per tablet Take 1 tablet by mouth daily.        Marland Kitchen gabapentin (NEURONTIN) 400 MG capsule Take 400 mg by mouth 3 (three) times daily.        . isosorbide mononitrate (IMDUR) 60 MG 24 hr tablet Take 60 mg by mouth daily.        . pantoprazole (PROTONIX) 40 MG tablet Take 40 mg by mouth daily.        . ritonavir (NORVIR) 100 MG capsule Take by mouth 2 (two) times daily.        . rosuvastatin (CRESTOR) 20 MG tablet Take 20 mg by mouth daily.        . traZODone (DESYREL) 50 MG tablet Take 1 tablet (50 mg total) by mouth at bedtime.  30 tablet  0    . valACYclovir (VALTREX) 500 MG tablet Take 500 mg by mouth 2 (two) times daily.          Review of Systems  All other systems reviewed and are negative.   Physical Exam   Blood pressure 119/92, pulse 92, temperature 98.1 F (36.7 C), temperature source Oral, resp. rate 20, height 5' 1.5" (1.562 m), weight 114.76 kg (253 lb), last menstrual period 05/14/2011.  Physical Exam  Constitutional: She appears well-developed and well-nourished.  HENT:  Head: Normocephalic.  Eyes: Pupils are equal, round, and reactive to light.  Neck: Normal range of motion. Neck supple.  Cardiovascular: Normal rate and regular rhythm.   Respiratory: Effort normal and breath sounds normal.  GI: Soft. Bowel sounds are normal. She exhibits no distension and no mass. There is no tenderness. There is no rebound and no guarding.  Genitourinary: No labial fusion. There is no rash, tenderness, lesion or injury on the right labia. There is  no rash, tenderness, lesion or injury on the left labia. There is bleeding around the vagina. No erythema or tenderness around the vagina. No signs of injury around the vagina. No vaginal discharge found.    Lab Results  Component Value Date   WBC 6.6 05/21/2011   HGB 11.4* 05/21/2011   HCT 36.7 05/21/2011   MCV 85.0 05/21/2011   PLT 214 05/21/2011   US shows cervical mass of 3.3 x 2.4 x 2.9 cm - likely fibroid. Wet prep normal  MAU Course  Procedures   Assessment and Plan  1.  Cervical Fibroid. Likely etiology of vaginal bleeding.  TSH and prolactin pending.  CBC normal.  Patient will need to follow up with PCP for OB/GYN referral if continues to have vaginal bleeding.  Will send pt home with Naproxen 500mg  bid.  Annalise Mcdiarmid JEHIEL 05/21/2011, 4:54 PM

## 2011-05-21 NOTE — Progress Notes (Signed)
Dr Adrian Blackwater in to discuss u/s and lab results with pt and plan for d/c.

## 2011-05-21 NOTE — Progress Notes (Signed)
Written and verbal d/c instructions given and understanding voiced. 

## 2011-05-22 LAB — GC/CHLAMYDIA PROBE AMP, GENITAL
Chlamydia, DNA Probe: NEGATIVE
GC Probe Amp, Genital: NEGATIVE

## 2011-05-24 ENCOUNTER — Inpatient Hospital Stay (HOSPITAL_COMMUNITY)
Admission: AD | Admit: 2011-05-24 | Discharge: 2011-05-24 | Disposition: A | Discharge status: Home / Self Care | Payer: Medicare PPO | Source: Ambulatory Visit | Attending: Obstetrics & Gynecology | Admitting: Obstetrics & Gynecology

## 2011-05-24 ENCOUNTER — Encounter (HOSPITAL_COMMUNITY): Payer: Self-pay | Admitting: *Deleted

## 2011-05-24 DIAGNOSIS — N939 Abnormal uterine and vaginal bleeding, unspecified: Secondary | ICD-10-CM

## 2011-05-24 DIAGNOSIS — D259 Leiomyoma of uterus, unspecified: Secondary | ICD-10-CM | POA: Insufficient documentation

## 2011-05-24 DIAGNOSIS — R109 Unspecified abdominal pain: Secondary | ICD-10-CM | POA: Insufficient documentation

## 2011-05-24 DIAGNOSIS — D219 Benign neoplasm of connective and other soft tissue, unspecified: Secondary | ICD-10-CM

## 2011-05-24 HISTORY — DX: Benign neoplasm of connective and other soft tissue, unspecified: D21.9

## 2011-05-24 HISTORY — DX: Human immunodeficiency virus (HIV) disease: B20

## 2011-05-24 LAB — RAPID URINE DRUG SCREEN, HOSP PERFORMED
Amphetamines: NOT DETECTED
Barbiturates: NOT DETECTED
Benzodiazepines: NOT DETECTED

## 2011-05-24 LAB — CBC
HCT: 36.2 % (ref 36.0–46.0)
MCH: 26 pg (ref 26.0–34.0)
MCV: 84.8 fL (ref 78.0–100.0)
Platelets: 199 10*3/uL (ref 150–400)
RBC: 4.27 MIL/uL (ref 3.87–5.11)
WBC: 3.7 10*3/uL — ABNORMAL LOW (ref 4.0–10.5)

## 2011-05-24 MED ORDER — IBUPROFEN 600 MG PO TABS: 600.0000 mg | ORAL_TABLET | Freq: Four times a day (QID) | ORAL | Status: AC | PRN

## 2011-05-24 MED ORDER — OXYCODONE-ACETAMINOPHEN 5-325 MG PO TABS: 1.0000 | ORAL_TABLET | ORAL | Status: AC | PRN

## 2011-05-24 MED ORDER — KETOROLAC TROMETHAMINE 30 MG/ML IJ SOLN
30.0000 mg | Freq: Four times a day (QID) | INTRAMUSCULAR | Status: DC | PRN
Start: 2011-05-24 — End: 2011-05-24
  Administered 2011-05-24: 30 mg via INTRAMUSCULAR
  Filled 2011-05-24: qty 1

## 2011-05-24 NOTE — Progress Notes (Signed)
Pt reports having vaginal bleeding on and off since 9/7 dx with fibriods. Started having abd pain/cramping yesterday that shoot down her legs

## 2011-05-24 NOTE — ED Notes (Signed)
Was seen in MAU 9/14 and was told she had fibroids. Saw Dr. Adrian Blackwater. Was given a prescription for Naprosyn and states that it does not relieve her pain and that her pain is worse. Was referred to GYN clinic, states she has been a patient there before. States she went to clinic today to make an appointment, but she was crying and in so much pain, they sent her back  to MAU. Pt. is rocking in bed. C/O a lot of pain.
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
## 2011-05-25 LAB — GC/CHLAMYDIA PROBE AMP, URINE
Chlamydia, Swab/Urine, PCR: NEGATIVE
GC Probe Amp, Urine: NEGATIVE

## 2011-05-26 ENCOUNTER — Encounter: Payer: Self-pay | Admitting: Obstetrics and Gynecology

## 2011-05-27 DIAGNOSIS — R638 Other symptoms and signs concerning food and fluid intake: Secondary | ICD-10-CM

## 2011-05-27 DIAGNOSIS — B20 Human immunodeficiency virus [HIV] disease: Secondary | ICD-10-CM | POA: Diagnosis present

## 2011-05-27 DIAGNOSIS — Z21 Asymptomatic human immunodeficiency virus [HIV] infection status: Secondary | ICD-10-CM

## 2011-05-27 HISTORY — DX: Human immunodeficiency virus (HIV) disease: B20

## 2011-05-27 HISTORY — DX: Other symptoms and signs concerning food and fluid intake: R63.8

## 2011-05-27 HISTORY — DX: Asymptomatic human immunodeficiency virus (hiv) infection status: Z21

## 2011-05-27 LAB — CBC
HCT: 34.1 — ABNORMAL LOW
Platelets: 232
RDW: 13.8
WBC: 5.4

## 2011-05-27 LAB — I-STAT 8, (EC8 V) (CONVERTED LAB)
BUN: 8
Bicarbonate: 26.3 — ABNORMAL HIGH
Chloride: 109
Glucose, Bld: 93
HCT: 43
Operator id: 272551
pCO2, Ven: 42.5 — ABNORMAL LOW
pH, Ven: 7.4 — ABNORMAL HIGH

## 2011-05-27 LAB — POCT CARDIAC MARKERS
CKMB, poc: <1 — ABNORMAL LOW
Myoglobin, poc: 278
Myoglobin, poc: 85.9
Operator id: 272551
Operator id: 272551
Troponin i, poc: <0.05

## 2011-05-27 LAB — CK TOTAL AND CKMB (NOT AT ARMC)
CK, MB: 0.7
CK, MB: 0.7
Relative Index: INVALID
Relative Index: INVALID

## 2011-05-27 LAB — APTT: aPTT: 33

## 2011-05-27 LAB — PROTIME-INR
INR: 1
Prothrombin Time: 13.6

## 2011-05-27 LAB — TROPONIN I: Troponin I: 0.01

## 2011-05-27 LAB — TSH: TSH: 1.377

## 2011-05-28 LAB — I-STAT 8, (EC8 V) (CONVERTED LAB)
BUN: 5 — ABNORMAL LOW
Chloride: 107
HCT: 34 — ABNORMAL LOW
Hemoglobin: 11.6 — ABNORMAL LOW
Operator id: 277751
Sodium: 140

## 2011-05-28 LAB — POCT CARDIAC MARKERS
CKMB, poc: <1 — ABNORMAL LOW
CKMB, poc: <1 — ABNORMAL LOW
Operator id: 277751
Operator id: 277751
Troponin i, poc: <0.05
Troponin i, poc: <0.05
Troponin i, poc: <0.05

## 2011-05-28 LAB — DIFFERENTIAL
Lymphocytes Relative: 31
Lymphs Abs: 1.7
Monocytes Relative: 9
Neutrophils Relative %: 56

## 2011-05-28 LAB — CBC
HCT: 31.2 — ABNORMAL LOW
Platelets: 247
RBC: 3.13 — ABNORMAL LOW
WBC: 5.4

## 2011-05-28 LAB — POCT I-STAT CREATININE
Creatinine, Ser: 1
Operator id: 277751

## 2011-05-28 LAB — T-HELPER CELL (CD4) - (RCID CLINIC ONLY): CD4 % Helper T Cell: 29 — ABNORMAL LOW

## 2011-05-31 LAB — T-HELPER CELL (CD4) - (RCID CLINIC ONLY)
CD4 % Helper T Cell: 33
CD4 T Cell Abs: 430

## 2011-05-31 NOTE — ED Provider Notes (Signed)
Pt needs pelvic exam in clinic.  Agree with above note.  Maya Scholer H. 05/31/2011 2:11 PM

## 2011-06-02 LAB — COMPREHENSIVE METABOLIC PANEL
AST: 23
Albumin: 4.1
BUN: 7
Calcium: 8.8
Chloride: 106
Creatinine, Ser: 0.72
GFR calc Af Amer: >60
Total Bilirubin: 0.5
Total Protein: 8.1

## 2011-06-02 LAB — DIFFERENTIAL
Basophils Absolute: 0
Eosinophils Relative: 4
Lymphocytes Relative: 52 — ABNORMAL HIGH
Lymphs Abs: 1.7
Monocytes Absolute: 0.4
Neutro Abs: 1 — ABNORMAL LOW

## 2011-06-02 LAB — CBC
HCT: 36.3
MCHC: 32.8
MCV: 90.2
Platelets: 201
RDW: 13.6
WBC: 3.4 — ABNORMAL LOW

## 2011-06-02 LAB — POCT RAPID STREP A: Streptococcus, Group A Screen (Direct): NEGATIVE

## 2011-06-02 LAB — URINALYSIS, ROUTINE W REFLEX MICROSCOPIC
Hgb urine dipstick: NEGATIVE
Nitrite: NEGATIVE
Specific Gravity, Urine: 1.017
Urobilinogen, UA: 0.2
pH: 5.5

## 2011-06-02 LAB — POCT PREGNANCY, URINE: Preg Test, Ur: NEGATIVE

## 2011-06-03 LAB — POCT I-STAT, CHEM 8
Chloride: 106
HCT: 36
Hemoglobin: 12.2
Potassium: 3.4 — ABNORMAL LOW
Sodium: 143

## 2011-06-03 LAB — DIFFERENTIAL
Lymphs Abs: 2.1
Monocytes Absolute: 0.5
Monocytes Relative: 8
Neutro Abs: 4.1
Neutrophils Relative %: 58

## 2011-06-03 LAB — POCT CARDIAC MARKERS
CKMB, poc: 1.2
Myoglobin, poc: 57.9
Troponin i, poc: <0.05

## 2011-06-03 LAB — CBC
Hemoglobin: 11.2 — ABNORMAL LOW
MCV: 86.6
RBC: 3.96
WBC: 7.1

## 2011-06-03 LAB — T-HELPER CELL (CD4) - (RCID CLINIC ONLY)
CD4 % Helper T Cell: 32 — ABNORMAL LOW
CD4 T Cell Abs: 460

## 2011-06-07 LAB — CULTURE, BLOOD (ROUTINE X 2): Culture: NO GROWTH

## 2011-06-07 LAB — URINE MICROSCOPIC-ADD ON

## 2011-06-07 LAB — POCT I-STAT 3, ART BLOOD GAS (G3+)
TCO2: 27
pCO2 arterial: 39.7
pH, Arterial: 7.416 — ABNORMAL HIGH

## 2011-06-07 LAB — POCT I-STAT, CHEM 8
BUN: 8
Hemoglobin: 11.6 — ABNORMAL LOW
Sodium: 138
TCO2: 23

## 2011-06-07 LAB — HIV-1 GENOTYPR PLUS

## 2011-06-07 LAB — HIV-1 RNA QUANT-NO REFLEX-BLD
HIV 1 RNA Quant: 656 copies/mL — ABNORMAL HIGH (ref <50)
HIV-1 RNA Quant, Log: 2.82 — ABNORMAL HIGH (ref <1.70)

## 2011-06-07 LAB — CULTURE, RESPIRATORY W GRAM STAIN

## 2011-06-07 LAB — BASIC METABOLIC PANEL
Calcium: 8.4
GFR calc Af Amer: >60
GFR calc non Af Amer: >60
Sodium: 135

## 2011-06-07 LAB — CBC
HCT: 30.9 — ABNORMAL LOW
HCT: 33 — ABNORMAL LOW
Hemoglobin: 10.6 — ABNORMAL LOW
Hemoglobin: 12.1
MCHC: 32.3
MCV: 79.4
MCV: 77.7 — ABNORMAL LOW
MCV: 77.8 — ABNORMAL LOW
Platelets: 231
Platelets: 188
Platelets: 199
RBC: 4.86
RDW: 15
WBC: 6
WBC: 3.6 — ABNORMAL LOW
WBC: 4.6

## 2011-06-07 LAB — EXPECTORATED SPUTUM ASSESSMENT W GRAM STAIN, RFLX TO RESP C

## 2011-06-07 LAB — URINALYSIS, ROUTINE W REFLEX MICROSCOPIC
Bilirubin Urine: NEGATIVE
Glucose, UA: NEGATIVE
Nitrite: NEGATIVE
Protein, ur: NEGATIVE
Specific Gravity, Urine: 1.015
Specific Gravity, Urine: >1.046 — ABNORMAL HIGH
pH: 6

## 2011-06-07 LAB — COMPREHENSIVE METABOLIC PANEL
Albumin: 2.8 — ABNORMAL LOW
BUN: 6
Creatinine, Ser: 0.72
Total Bilirubin: 0.4
Total Protein: 6.7

## 2011-06-07 LAB — DIFFERENTIAL
Basophils Absolute: 0
Eosinophils Absolute: 0.2
Lymphocytes Relative: 29
Lymphocytes Relative: 29
Lymphs Abs: 1.7
Monocytes Absolute: 0.5
Monocytes Relative: 10
Neutro Abs: 3.4
Neutro Abs: 2.8
Neutrophils Relative %: 57

## 2011-06-07 LAB — ETHANOL: Alcohol, Ethyl (B): <5

## 2011-06-07 LAB — CARDIAC PANEL(CRET KIN+CKTOT+MB+TROPI)
Relative Index: 0.4
Troponin I: 0.02

## 2011-06-07 LAB — LIPID PANEL
Cholesterol: 141
LDL Cholesterol: 92
Total CHOL/HDL Ratio: 4
Triglycerides: 70
VLDL: 14

## 2011-06-07 LAB — RAPID URINE DRUG SCREEN, HOSP PERFORMED
Opiates: POSITIVE — AB
Tetrahydrocannabinol: POSITIVE — AB

## 2011-06-07 LAB — POCT CARDIAC MARKERS: Troponin i, poc: <0.05

## 2011-06-07 LAB — CK TOTAL AND CKMB (NOT AT ARMC)
CK, MB: 2.9
Relative Index: 0.4

## 2011-06-07 LAB — APTT: aPTT: 32

## 2011-06-07 LAB — B-NATRIURETIC PEPTIDE (CONVERTED LAB): Pro B Natriuretic peptide (BNP): 53

## 2011-06-15 LAB — T-HELPER CELL (CD4) - (RCID CLINIC ONLY)
CD4 % Helper T Cell: 33
CD4 T Cell Abs: 680

## 2011-06-16 LAB — BASIC METABOLIC PANEL
BUN: 10
BUN: 7
CO2: 27
Calcium: 8.2 — ABNORMAL LOW
Calcium: 8.7
Calcium: 8.8
Chloride: 99
Creatinine, Ser: 0.77
GFR calc Af Amer: >60
GFR calc Af Amer: >60
GFR calc non Af Amer: >60
GFR calc non Af Amer: >60
GFR calc non Af Amer: >60
Glucose, Bld: 87
Potassium: 3.7
Potassium: 3.7
Potassium: 4
Sodium: 129 — ABNORMAL LOW
Sodium: 137
Sodium: 138

## 2011-06-16 LAB — CK TOTAL AND CKMB (NOT AT ARMC)
CK, MB: 1.2
CK, MB: 1.2
CK, MB: 1.3
Relative Index: 0.8
Relative Index: 0.9
Relative Index: 1
Total CK: 118
Total CK: 139
Total CK: 144

## 2011-06-16 LAB — RAPID URINE DRUG SCREEN, HOSP PERFORMED
Amphetamines: NOT DETECTED
Barbiturates: NOT DETECTED
Benzodiazepines: POSITIVE — AB

## 2011-06-16 LAB — I-STAT 8, (EC8 V) (CONVERTED LAB)
BUN: 8
Bicarbonate: 28.6 — ABNORMAL HIGH
HCT: 36
Operator id: 282201
pCO2, Ven: 49.8
pH, Ven: 7.367 — ABNORMAL HIGH

## 2011-06-16 LAB — TROPONIN I
Troponin I: 0.07 — ABNORMAL HIGH
Troponin I: 0.09 — ABNORMAL HIGH

## 2011-06-16 LAB — CBC
HCT: 31 — ABNORMAL LOW
HCT: 33 — ABNORMAL LOW
HCT: 33.1 — ABNORMAL LOW
HCT: 34.6 — ABNORMAL LOW
Hemoglobin: 10 — ABNORMAL LOW
Hemoglobin: 10.5 — ABNORMAL LOW
Hemoglobin: 10.8 — ABNORMAL LOW
MCHC: 32.2
MCHC: 32.3
MCV: 100.4 — ABNORMAL HIGH
MCV: 101 — ABNORMAL HIGH
MCV: 99.8
Platelets: 196
Platelets: 218
Platelets: 234
RBC: 3.08 — ABNORMAL LOW
RBC: 3.27 — ABNORMAL LOW
RBC: 3.36 — ABNORMAL LOW
RDW: 14.8 — ABNORMAL HIGH
RDW: 14.9 — ABNORMAL HIGH
RDW: 15.3 — ABNORMAL HIGH
WBC: 4.1
WBC: 4.6
WBC: 4.9
WBC: 5.1

## 2011-06-16 LAB — TSH: TSH: 3.02

## 2011-06-16 LAB — HEPARIN LEVEL (UNFRACTIONATED)
Heparin Unfractionated: 0.4
Heparin Unfractionated: 0.5

## 2011-06-16 LAB — DIFFERENTIAL
Eosinophils Absolute: 0.3
Eosinophils Relative: 6 — ABNORMAL HIGH
Lymphs Abs: 2
Monocytes Absolute: 0.5

## 2011-06-16 LAB — POCT I-STAT CREATININE: Creatinine, Ser: 0.8

## 2011-06-16 LAB — POCT CARDIAC MARKERS
CKMB, poc: <1 — ABNORMAL LOW
CKMB, poc: <1 — ABNORMAL LOW
CKMB, poc: <1 — ABNORMAL LOW
Myoglobin, poc: 68
Myoglobin, poc: 68.7
Operator id: 279831
Operator id: 282201
Troponin i, poc: <0.05
Troponin i, poc: <0.05

## 2011-06-16 LAB — LIPID PANEL: Cholesterol: 144

## 2011-06-24 ENCOUNTER — Other Ambulatory Visit: Payer: Self-pay | Admitting: Adult Health

## 2011-06-30 ENCOUNTER — Encounter: Payer: Medicare PPO | Admitting: Obstetrics and Gynecology

## 2011-07-08 ENCOUNTER — Other Ambulatory Visit: Payer: Self-pay | Admitting: *Deleted

## 2011-07-08 DIAGNOSIS — B20 Human immunodeficiency virus [HIV] disease: Secondary | ICD-10-CM

## 2011-07-08 DIAGNOSIS — K219 Gastro-esophageal reflux disease without esophagitis: Secondary | ICD-10-CM

## 2011-07-08 MED ORDER — EMTRICITABINE-TENOFOVIR DF 200-300 MG PO TABS: 1.0000 | ORAL_TABLET | Freq: Every day | ORAL | Status: DC

## 2011-07-08 MED ORDER — PANTOPRAZOLE SODIUM 40 MG PO TBEC: 40.0000 mg | DELAYED_RELEASE_TABLET | Freq: Every day | ORAL | Status: DC

## 2011-07-08 MED ORDER — RITONAVIR 100 MG PO CAPS: 100.0000 mg | ORAL_CAPSULE | Freq: Two times a day (BID) | ORAL | Status: DC

## 2011-07-08 MED ORDER — DARUNAVIR ETHANOLATE 400 MG PO TABS: 800.0000 mg | ORAL_TABLET | Freq: Every day | ORAL | Status: DC

## 2011-09-01 ENCOUNTER — Emergency Department (HOSPITAL_COMMUNITY): Payer: Medicare PPO

## 2011-09-01 ENCOUNTER — Emergency Department (HOSPITAL_COMMUNITY)
Admission: EM | Admit: 2011-09-01 | Discharge: 2011-09-01 | Disposition: A | Discharge status: Home / Self Care | Payer: Medicare PPO | Attending: Emergency Medicine | Admitting: Emergency Medicine

## 2011-09-01 ENCOUNTER — Encounter (HOSPITAL_COMMUNITY): Payer: Self-pay | Admitting: Emergency Medicine

## 2011-09-01 DIAGNOSIS — R5383 Other fatigue: Secondary | ICD-10-CM | POA: Insufficient documentation

## 2011-09-01 DIAGNOSIS — Z79899 Other long term (current) drug therapy: Secondary | ICD-10-CM | POA: Insufficient documentation

## 2011-09-01 DIAGNOSIS — E7800 Pure hypercholesterolemia, unspecified: Secondary | ICD-10-CM | POA: Insufficient documentation

## 2011-09-01 DIAGNOSIS — R059 Cough, unspecified: Secondary | ICD-10-CM | POA: Insufficient documentation

## 2011-09-01 DIAGNOSIS — R1110 Vomiting, unspecified: Secondary | ICD-10-CM | POA: Insufficient documentation

## 2011-09-01 DIAGNOSIS — IMO0001 Reserved for inherently not codable concepts without codable children: Secondary | ICD-10-CM | POA: Insufficient documentation

## 2011-09-01 DIAGNOSIS — R509 Fever, unspecified: Secondary | ICD-10-CM | POA: Insufficient documentation

## 2011-09-01 DIAGNOSIS — Z21 Asymptomatic human immunodeficiency virus [HIV] infection status: Secondary | ICD-10-CM | POA: Insufficient documentation

## 2011-09-01 DIAGNOSIS — Z7982 Long term (current) use of aspirin: Secondary | ICD-10-CM | POA: Insufficient documentation

## 2011-09-01 DIAGNOSIS — I252 Old myocardial infarction: Secondary | ICD-10-CM | POA: Insufficient documentation

## 2011-09-01 DIAGNOSIS — J111 Influenza due to unidentified influenza virus with other respiratory manifestations: Secondary | ICD-10-CM | POA: Insufficient documentation

## 2011-09-01 DIAGNOSIS — R05 Cough: Secondary | ICD-10-CM | POA: Insufficient documentation

## 2011-09-01 DIAGNOSIS — R5381 Other malaise: Secondary | ICD-10-CM | POA: Insufficient documentation

## 2011-09-01 LAB — CBC
HCT: 39.1 % (ref 36.0–46.0)
Hemoglobin: 12.6 g/dL (ref 12.0–15.0)
MCH: 26.3 pg (ref 26.0–34.0)
MCHC: 32.2 g/dL (ref 30.0–36.0)
RDW: 14.5 % (ref 11.5–15.5)

## 2011-09-01 LAB — BASIC METABOLIC PANEL
BUN: 7 mg/dL (ref 6–23)
Calcium: 9.4 mg/dL (ref 8.4–10.5)
GFR calc non Af Amer: >90 mL/min (ref >90)
Glucose, Bld: 91 mg/dL (ref 70–99)
Sodium: 139 mEq/L (ref 135–145)

## 2011-09-01 MED ORDER — HYDROCODONE-ACETAMINOPHEN 7.5-500 MG/15ML PO SOLN: 15.0000 mL | Freq: Four times a day (QID) | ORAL | Status: AC | PRN

## 2011-09-01 MED ORDER — HYDROCODONE-ACETAMINOPHEN 7.5-500 MG/15ML PO SOLN
10.0000 mL | Freq: Once | ORAL | Status: DC
  Filled 2011-09-01: qty 15

## 2011-09-01 MED ORDER — OSELTAMIVIR PHOSPHATE 75 MG PO CAPS: 75.0000 mg | ORAL_CAPSULE | Freq: Two times a day (BID) | ORAL | Status: AC

## 2011-09-01 MED ORDER — AZITHROMYCIN 250 MG PO TABS: 250.0000 mg | ORAL_TABLET | Freq: Every day | ORAL | Status: AC

## 2011-09-01 MED ORDER — ALBUTEROL SULFATE HFA 108 (90 BASE) MCG/ACT IN AERS
2.0000 | INHALATION_SPRAY | RESPIRATORY_TRACT | Status: DC | PRN
  Administered 2011-09-01: 2 via RESPIRATORY_TRACT
  Filled 2011-09-01: qty 6.7

## 2011-09-01 MED ORDER — ACETAMINOPHEN 325 MG PO TABS
650.0000 mg | ORAL_TABLET | Freq: Once | ORAL | Status: AC
  Administered 2011-09-01: 650 mg via ORAL
  Filled 2011-09-01: qty 2

## 2011-09-01 MED ORDER — HYDROCODONE-ACETAMINOPHEN 7.5-500 MG/15ML PO SOLN
15.0000 mL | Freq: Once | ORAL | Status: AC
  Administered 2011-09-01: 15 mL via ORAL

## 2011-09-01 NOTE — ED Notes (Signed)
Onset one day ago cough non productive continued today along with cough induce emesis.  Airway intact bilateral equal chest rise and fall.
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
## 2011-09-01 NOTE — ED Notes (Signed)
Back from xray, calm, NAD alert, interactive, wearing mask.

## 2011-09-01 NOTE — ED Notes (Signed)
Pt not in ropom, pt in xray.

## 2011-09-01 NOTE — ED Notes (Signed)
Pt c/o fever and generalized body aches with cough x several days

## 2011-09-06 ENCOUNTER — Other Ambulatory Visit: Payer: Self-pay | Admitting: *Deleted

## 2011-09-06 DIAGNOSIS — K219 Gastro-esophageal reflux disease without esophagitis: Secondary | ICD-10-CM

## 2011-09-06 MED ORDER — PANTOPRAZOLE SODIUM 20 MG PO TBEC: 40.0000 mg | DELAYED_RELEASE_TABLET | Freq: Every day | ORAL | Status: DC

## 2011-09-06 NOTE — Telephone Encounter (Signed)
[facility] unable to obtain 40 mg pantoprazole.  Requested to change to 2-20mg  pantoprazole daily.  OKed change and gave 6 refills.

## 2011-10-14 DIAGNOSIS — R102 Pelvic and perineal pain: Secondary | ICD-10-CM

## 2011-10-14 HISTORY — DX: Pelvic and perineal pain: R10.2

## 2011-10-17 NOTE — Assessment & Plan Note (Signed)
Claims significant, vaginal bleeding. Has history of irregular menses, but this time. It comes, with severe lower abdominal pain. We recommended to her that she needs to go to the [facility]. Immediately following clinic today for further evaluation. She verbally acknowledged this information and agreed with our plan.

## 2011-10-17 NOTE — Assessment & Plan Note (Signed)
Clinically stable on current regimen. Continue present management.  Counseling provided on prevention of transmission of HIV. Condoms offered:  Refused claims not sexually active Medication adherence discussed with patient. [facility] Follow up visit in 4 months with labs 2 weeks prior to appointment. Patient verbally acknowledged information provided to them and agreed with plan of care.

## 2011-10-17 NOTE — Progress Notes (Signed)
Subjective:    Patient ID: [name] is a 51 y.o. female.  Chief Complaint: HIV Follow-up Visit [name] is here for follow-up of HIV infection. She is feeling worse since her last visit.  She claims continued adherence to therapy with good tolerance and no complications. There are additional complaints. She claims she has a history of irregular menses, and she is on her period now, but she is now having severe lower abdominal pain, with more blood than she normally does to the point where she cannot sit without doubling over.  Data Review: Diagnostic studies reviewed.  Review of Systems - General ROS: positive for  - fatigue and malaise negative for - fever, hot flashes or night sweats Psychological ROS: positive for - anxiety and depression negative for - behavioral disorder, concentration difficulties, memory difficulties or mood swings ENT ROS: negative Breast ROS: negative for breast lumps Cardiovascular ROS: no chest pain or dyspnea on exertion Gastrointestinal ROS: positive for - abdominal pain and appetite loss Genito-Urinary ROS: positive for - change in menstrual cycle, dysmenorrhea and irregular/heavy menses negative for - dysuria, hematuria or incontinence Neurological ROS: no TIA or stroke symptoms  Objective:   General appearance: alert, cooperative and moderate distress Head: Normocephalic, without obvious abnormality, atraumatic Eyes: conjunctivae/corneas clear. PERRL, EOM's intact. Fundi benign. Ears: normal TM's and external ear canals both ears Throat: lips, mucosa, and tongue normal; teeth and gums normal Resp: clear to auscultation bilaterally Cardio: regular rate and rhythm, S1, S2 normal, no murmur, click, rub or gallop GI: abnormal findings:  marked tenderness in the lower abdomen Skin: Skin color, texture, turgor normal. No rashes or lesions Neurologic: Alert and oriented X 3, normal strength and tone. Normal symmetric reflexes. Normal  coordination and gait Psych:  No vegetative signs or delusional behaviors noted.    Laboratory: From 05/05/2011 ,  CD4 count was 740 c/cmm @ 36 %. Viral load <20 copies/ml.     Assessment/Plan:   HUMAN IMMUNODEFICIENCY VIRUS [HIV] Clinically stable on current regimen. Continue present management.  Counseling provided on prevention of transmission of HIV. Condoms offered:  Refused claims not sexually active Medication adherence discussed with patient. [facility] today Follow up visit in 4 months with labs 2 weeks prior to appointment. Patient verbally acknowledged information provided to them and agreed with plan of care.   Dysmenorrhea Claims significant, vaginal bleeding. Has history of irregular menses, but this time. It comes, with severe lower abdominal pain. We recommended to her that she needs to go to the [facility]. Immediately following clinic today for further evaluation. She verbally acknowledged this information and agreed with our plan.     Tamesha Ellerbrock A. Sundra Aland, MS, [facility] [phone]  10/17/2011, 9:07 PM

## 2011-11-09 ENCOUNTER — Other Ambulatory Visit: Payer: Self-pay | Admitting: *Deleted

## 2011-11-09 NOTE — Telephone Encounter (Signed)
She continues to see Dr. Philipp Deputy at [facility]. Wanted [place] to call in something for her anxiety. States Dr. Philipp Deputy gave it to her before. She has an appt there but not today. I suggested she call & see if she can be seen sooner or if md will call her in something to hold her until her appt. She agreed with this plan

## 2011-11-19 ENCOUNTER — Other Ambulatory Visit: Payer: Self-pay | Admitting: *Deleted

## 2011-11-19 DIAGNOSIS — E785 Hyperlipidemia, unspecified: Secondary | ICD-10-CM

## 2011-11-19 DIAGNOSIS — Z79899 Other long term (current) drug therapy: Secondary | ICD-10-CM

## 2011-11-19 DIAGNOSIS — B20 Human immunodeficiency virus [HIV] disease: Secondary | ICD-10-CM

## 2011-11-19 DIAGNOSIS — Z7721 Contact with and (suspected) exposure to potentially hazardous body fluids: Secondary | ICD-10-CM

## 2011-11-23 ENCOUNTER — Other Ambulatory Visit: Payer: Medicare PPO

## 2011-11-23 DIAGNOSIS — B20 Human immunodeficiency virus [HIV] disease: Secondary | ICD-10-CM

## 2011-11-23 LAB — COMPLETE METABOLIC PANEL WITH GFR
AST: 20 U/L (ref 0–37)
Alkaline Phosphatase: 55 U/L (ref 39–117)
BUN: 8 mg/dL (ref 6–23)
Calcium: 9.1 mg/dL (ref 8.4–10.5)
Chloride: 107 mEq/L (ref 96–112)
Creat: 0.64 mg/dL (ref 0.50–1.10)

## 2011-11-23 LAB — CBC WITH DIFFERENTIAL/PLATELET
Basophils Absolute: 0 10*3/uL (ref 0.0–0.1)
Basophils Relative: 0 % (ref 0–1)
Eosinophils Absolute: 0.1 10*3/uL (ref 0.0–0.7)
Eosinophils Relative: 2 % (ref 0–5)
HCT: 38.8 % (ref 36.0–46.0)
MCHC: 31.7 g/dL (ref 30.0–36.0)
MCV: 82.4 fL (ref 78.0–100.0)
Monocytes Absolute: 0.3 10*3/uL (ref 0.1–1.0)
RDW: 15 % (ref 11.5–15.5)

## 2011-11-24 LAB — T-HELPER CELL (CD4) - (RCID CLINIC ONLY)
CD4 % Helper T Cell: 20 % — ABNORMAL LOW (ref 33–55)
CD4 T Cell Abs: 320 uL — ABNORMAL LOW (ref 400–2700)

## 2011-12-06 DIAGNOSIS — I639 Cerebral infarction, unspecified: Secondary | ICD-10-CM

## 2011-12-06 HISTORY — DX: Cerebral infarction, unspecified: I63.9

## 2011-12-07 ENCOUNTER — Ambulatory Visit (INDEPENDENT_AMBULATORY_CARE_PROVIDER_SITE_OTHER): Payer: Medicare PPO | Admitting: Internal Medicine

## 2011-12-07 ENCOUNTER — Ambulatory Visit
Admission: RE | Admit: 2011-12-07 | Discharge: 2011-12-07 | Disposition: A | Discharge status: Home / Self Care | Payer: Medicare PPO | Source: Ambulatory Visit | Attending: Internal Medicine | Admitting: Internal Medicine

## 2011-12-07 ENCOUNTER — Encounter: Payer: Self-pay | Admitting: Internal Medicine

## 2011-12-07 VITALS — BP 128/88 | HR 101 | Temp 97.6°F | Ht 62.0 in | Wt 257.0 lb

## 2011-12-07 DIAGNOSIS — B20 Human immunodeficiency virus [HIV] disease: Secondary | ICD-10-CM

## 2011-12-07 NOTE — Progress Notes (Signed)
  Subjective:    Patient ID: [name], female    DOB: Sep 01, 1961, 51 y.o.   MRN: [id number]  HPIshe comes in today for followup of her HIV. She has not been seen since September 2012. Previously, she was well controlled with undetectable viral load and a normal CD4 T-cell count. However, since her last visit, she has been completely off her antiretroviral therapy. She states that during the holidays in November and December she became distracted and was intermittently taking her medication and by January completely stopped. She otherwise has had no new issues, no hospitalizations or other problems.    Review of Systems  Constitutional: Negative for fever, chills, fatigue and unexpected weight change.  HENT: Negative for sore throat and trouble swallowing.   Respiratory: Negative for cough and shortness of breath.   Cardiovascular: Negative for chest pain, palpitations and leg swelling.  Gastrointestinal: Negative for nausea, abdominal pain and diarrhea.  Genitourinary: Negative for pelvic pain.  Musculoskeletal: Positive for myalgias. Negative for joint swelling and arthralgias.  Skin: Negative for pallor and rash.  Neurological: Negative for dizziness and headaches.  Hematological: Negative for adenopathy.  Psychiatric/Behavioral: Negative for dysphoric mood. The patient is not nervous/anxious.        Objective:   Physical Exam  Constitutional: She appears well-developed and well-nourished. No distress.  HENT:  Mouth/Throat: Oropharynx is clear and moist. No oropharyngeal exudate.  Cardiovascular: Normal rate, regular rhythm and normal heart sounds.  Exam reveals no gallop and no friction rub.   No murmur heard. Pulmonary/Chest: Effort normal and breath sounds normal. No respiratory distress. She has no wheezes. She has no rales.  Abdominal: Soft. Bowel sounds are normal.  Lymphadenopathy:    She has no cervical adenopathy.          Assessment & Plan:

## 2011-12-07 NOTE — Assessment & Plan Note (Addendum)
Unfortunately she has stopped. However I did discuss with her restarting her medications and she will start tomorrow. I will recheck her viral load and CD4 count in one month and she will followup with me 2 weeks later. I did discuss the risks of resistance in someone who stops medication. She did mention to me that her previous provider had discussed with her at the option of a one pill a day medication, however I discussed with her that this likely is not a good option for her since it is not a very for giving medication. Therefore she will continue with the current regimen.  She will fax [place] information on a recent Pap smear that she had.  I will check a x-ray of her hips bilaterally. She does complain of muscle pain and difficulty standing and I am most concerned with osteoarthritis. I doubt this would be avascular necrosis since it is bilateral in more isolated in her muscles.  She will need to be scheduled for a mammogram next visit.

## 2011-12-09 ENCOUNTER — Encounter (HOSPITAL_COMMUNITY): Payer: Self-pay | Admitting: *Deleted

## 2011-12-09 ENCOUNTER — Emergency Department (HOSPITAL_COMMUNITY): Payer: Medicare PPO

## 2011-12-09 ENCOUNTER — Other Ambulatory Visit: Payer: Self-pay

## 2011-12-09 ENCOUNTER — Emergency Department (HOSPITAL_COMMUNITY)
Admission: EM | Admit: 2011-12-09 | Discharge: 2011-12-09 | Disposition: A | Discharge status: Home / Self Care | Payer: Medicare PPO | Attending: Emergency Medicine | Admitting: Emergency Medicine

## 2011-12-09 DIAGNOSIS — R112 Nausea with vomiting, unspecified: Secondary | ICD-10-CM | POA: Insufficient documentation

## 2011-12-09 DIAGNOSIS — R5381 Other malaise: Secondary | ICD-10-CM | POA: Insufficient documentation

## 2011-12-09 DIAGNOSIS — M25559 Pain in unspecified hip: Secondary | ICD-10-CM

## 2011-12-09 DIAGNOSIS — I252 Old myocardial infarction: Secondary | ICD-10-CM | POA: Insufficient documentation

## 2011-12-09 DIAGNOSIS — R509 Fever, unspecified: Secondary | ICD-10-CM

## 2011-12-09 DIAGNOSIS — Z79899 Other long term (current) drug therapy: Secondary | ICD-10-CM | POA: Insufficient documentation

## 2011-12-09 DIAGNOSIS — Z21 Asymptomatic human immunodeficiency virus [HIV] infection status: Secondary | ICD-10-CM | POA: Insufficient documentation

## 2011-12-09 LAB — URINALYSIS, ROUTINE W REFLEX MICROSCOPIC
Bilirubin Urine: NEGATIVE
Glucose, UA: NEGATIVE mg/dL
Hgb urine dipstick: NEGATIVE
Ketones, ur: 15 mg/dL — AB
Leukocytes, UA: NEGATIVE
Nitrite: NEGATIVE
Protein, ur: 100 mg/dL — AB
Specific Gravity, Urine: 1.017 (ref 1.005–1.030)
Urobilinogen, UA: 0.2 mg/dL (ref 0.0–1.0)
pH: 5.5 (ref 5.0–8.0)

## 2011-12-09 LAB — URINE MICROSCOPIC-ADD ON

## 2011-12-09 MED ORDER — TRAMADOL HCL 50 MG PO TABS: 50.0000 mg | ORAL_TABLET | Freq: Four times a day (QID) | ORAL | Status: AC | PRN

## 2011-12-09 MED ORDER — ONDANSETRON 8 MG PO TBDP: 8.0000 mg | ORAL_TABLET | Freq: Two times a day (BID) | ORAL | Status: AC | PRN
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
## 2011-12-09 NOTE — ED Notes (Addendum)
Per ems pt is from home. Alert and oriented x4, ambulates very slowly. [facility]. Pt reported to EMS hx of HIV. Pt vomitted yesterday, nothing today. Pt was feeling achy and was concerned, called ems for transport to hospital. Pt thinks she got sick at the hospital.

## 2011-12-09 NOTE — ED Notes (Signed)
T load went from undetectable to 320 and that her viral load increased. Pt thinks she picked up something from the hospital on Tuesday. Pt has had a fever, weakness, and joint pain.

## 2011-12-09 NOTE — Discharge Instructions (Signed)
Fever, Adult A fever is a higher than normal body temperature. In an adult, an oral temperature around 98.6 F (37 C) is considered normal. A temperature of 100.4 F (38 C) or higher is generally considered a fever. Mild or moderate fevers generally have no long-term effects and often do not require treatment. Extreme fever (greater than or equal to 106 F or 41.1 C) can cause seizures. The sweating that may occur with repeated or prolonged fever may cause dehydration. Elderly people can develop confusion during a fever. A measured temperature can vary with:  Age.   Time of day.   Method of measurement (mouth, underarm, rectal, or ear).  The fever is confirmed by taking a temperature with a thermometer. Temperatures can be taken different ways. Some methods are accurate and some are not.  An oral temperature is used most commonly. Electronic thermometers are fast and accurate.   An ear temperature will only be accurate if the thermometer is positioned as recommended by the manufacturer.   A rectal temperature is accurate and done for those adults who have a condition where an oral temperature cannot be taken.   An underarm (axillary) temperature is not accurate and not recommended.  Fever is a symptom, not a disease.  CAUSES   Infections commonly cause fever.   Some noninfectious causes for fever include:   Some arthritis conditions.   Some thyroid or adrenal gland conditions.   Some immune system conditions.   Some types of cancer.   A medicine reaction.   High doses of certain street drugs such as methamphetamine.   Dehydration.   Exposure to high outside or room temperatures.   Occasionally, the source of a fever cannot be determined. This is sometimes called a "fever of unknown origin" (FUO).   Some situations may lead to a temporary rise in body temperature that may go away on its own. Examples are:   Childbirth.   Surgery.   Intense exercise.  HOME CARE  INSTRUCTIONS   Take appropriate medicines for fever. Follow dosing instructions carefully. If you use acetaminophen to reduce the fever, be careful to avoid taking other medicines that also contain acetaminophen. Do not take aspirin for a fever if you are younger than age 19. There is an association with Reye's syndrome. Reye's syndrome is a rare but potentially deadly disease.   If an infection is present and antibiotics have been prescribed, take them as directed. Finish them even if you start to feel better.   Rest as needed.   Maintain an adequate fluid intake. To prevent dehydration during an illness with prolonged or recurrent fever, you may need to drink extra fluid.Drink enough fluids to keep your urine clear or pale yellow.   Sponging or bathing with room temperature water may help reduce body temperature. Do not use ice water or alcohol sponge baths.   Dress comfortably, but do not over-bundle.  SEEK MEDICAL CARE IF:   You are unable to keep fluids down.   You develop vomiting or diarrhea.   You are not feeling at least partly better after 3 days.   You develop new symptoms or problems.  SEEK IMMEDIATE MEDICAL CARE IF:   You have shortness of breath or trouble breathing.   You develop excessive weakness.   You are dizzy or you faint.   You are extremely thirsty or you are making little or no urine.   You develop new pain that was not there before (such as in the head,   neck, chest, back, or abdomen).   You have persistant vomiting and diarrhea for more than 1 to 2 days.   You develop a stiff neck or your eyes become sensitive to light.   You develop a skin rash.   You have a fever or persistent symptoms for more than 2 to 3 days.   You have a fever and your symptoms suddenly get worse.  MAKE SURE YOU:   Understand these instructions.   Will watch your condition.   Will get help right away if you are not doing well or get worse.  Document Released:  02/16/2001 Document Revised: 08/12/2011 Document Reviewed: 06/24/2011 [facility] Patient Information 2012 [place], [place].    Nausea and Vomiting Nausea is a sick feeling that often comes before throwing up (vomiting). Vomiting is a reflex where stomach contents come out of your mouth. Vomiting can cause severe loss of body fluids (dehydration). Children and elderly adults can become dehydrated quickly, especially if they also have diarrhea. Nausea and vomiting are symptoms of a condition or disease. It is important to find the cause of your symptoms. CAUSES   Direct irritation of the stomach lining. This irritation can result from increased acid production (gastroesophageal reflux disease), infection, food poisoning, taking certain medicines (such as nonsteroidal anti-inflammatory drugs), alcohol use, or tobacco use.   Signals from the brain.These signals could be caused by a headache, heat exposure, an inner ear disturbance, increased pressure in the brain from injury, infection, a tumor, or a concussion, pain, emotional stimulus, or metabolic problems.   An obstruction in the gastrointestinal tract (bowel obstruction).   Illnesses such as diabetes, hepatitis, gallbladder problems, appendicitis, kidney problems, cancer, sepsis, atypical symptoms of a heart attack, or eating disorders.   Medical treatments such as chemotherapy and radiation.   Receiving medicine that makes you sleep (general anesthetic) during surgery.  DIAGNOSIS Your caregiver may ask for tests to be done if the problems do not improve after a few days. Tests may also be done if symptoms are severe or if the reason for the nausea and vomiting is not clear. Tests may include:  Urine tests.   Blood tests.   Stool tests.   Cultures (to look for evidence of infection).   X-rays or other imaging studies.  Test results can help your caregiver make decisions about treatment or the need for additional tests. TREATMENT You  need to stay well hydrated. Drink frequently but in small amounts.You may wish to drink water, sports drinks, clear broth, or eat frozen ice pops or gelatin dessert to help stay hydrated.When you eat, eating slowly may help prevent nausea.There are also some antinausea medicines that may help prevent nausea. HOME CARE INSTRUCTIONS   Take all medicine as directed by your caregiver.   If you do not have an appetite, do not force yourself to eat. However, you must continue to drink fluids.   If you have an appetite, eat a normal diet unless your caregiver tells you differently.   Eat a variety of complex carbohydrates (rice, wheat, potatoes, bread), lean meats, yogurt, fruits, and vegetables.   Avoid high-fat foods because they are more difficult to digest.   Drink enough water and fluids to keep your urine clear or pale yellow.   If you are dehydrated, ask your caregiver for specific rehydration instructions. Signs of dehydration may include:   Severe thirst.   Dry lips and mouth.   Dizziness.   Dark urine.   Decreasing urine frequency and amount.  Confusion.   Rapid breathing or pulse.  SEEK IMMEDIATE MEDICAL CARE IF:   You have blood or brown flecks (like coffee grounds) in your vomit.   You have black or bloody stools.   You have a severe headache or stiff neck.   You are confused.   You have severe abdominal pain.   You have chest pain or trouble breathing.   You do not urinate at least once every 8 hours.   You develop cold or clammy skin.   You continue to vomit for longer than 24 to 48 hours.   You have a fever.  MAKE SURE YOU:   Understand these instructions.   Will watch your condition.   Will get help right away if you are not doing well or get worse.  Document Released: 08/23/2005 Document Revised: 08/12/2011 Document Reviewed: 01/20/2011 [facility] Patient Information 2012 [place], [place].

## 2011-12-09 NOTE — ED Notes (Signed)
MD at bedside. 

## 2011-12-09 NOTE — ED Notes (Signed)
GNF:AO13<YQ> Expected date:12/09/11<BR> Expected time:11:20 AM<BR> Means of arrival:Ambulance<BR> Comments:<BR> M61. 51 yo f. FLU LIKE SYMPTOMS, fever, vomiting. Vitals stable. 10 mins

## 2011-12-10 LAB — URINE CULTURE
Colony Count: NO GROWTH
Culture  Setup Time: 201304042238
Culture: NO GROWTH

## 2011-12-16 LAB — CULTURE, BLOOD (ROUTINE X 2)
Culture  Setup Time: 201304050127
Culture  Setup Time: 201304050128
Culture: NO GROWTH
Culture: NO GROWTH

## 2011-12-18 ENCOUNTER — Emergency Department (HOSPITAL_COMMUNITY): Payer: Medicare PPO

## 2011-12-18 ENCOUNTER — Inpatient Hospital Stay (HOSPITAL_COMMUNITY)
Admission: EM | Admit: 2011-12-18 | Discharge: 2011-12-29 | DRG: 061 | Disposition: A | Discharge status: Skilled Nursing Facility | Payer: Medicare PPO | Source: Ambulatory Visit | Attending: Neurology | Admitting: Neurology

## 2011-12-18 ENCOUNTER — Encounter (HOSPITAL_COMMUNITY): Payer: Self-pay

## 2011-12-18 ENCOUNTER — Inpatient Hospital Stay (HOSPITAL_COMMUNITY): Payer: Medicare PPO

## 2011-12-18 DIAGNOSIS — R4701 Aphasia: Secondary | ICD-10-CM | POA: Diagnosis present

## 2011-12-18 DIAGNOSIS — I252 Old myocardial infarction: Secondary | ICD-10-CM

## 2011-12-18 DIAGNOSIS — Q2111 Secundum atrial septal defect: Secondary | ICD-10-CM

## 2011-12-18 DIAGNOSIS — I639 Cerebral infarction, unspecified: Secondary | ICD-10-CM

## 2011-12-18 DIAGNOSIS — Z6841 Body Mass Index (BMI) 40.0 and over, adult: Secondary | ICD-10-CM

## 2011-12-18 DIAGNOSIS — Q2112 Patent foramen ovale: Secondary | ICD-10-CM

## 2011-12-18 DIAGNOSIS — E66813 Obesity, class 3: Secondary | ICD-10-CM | POA: Diagnosis present

## 2011-12-18 DIAGNOSIS — Z7982 Long term (current) use of aspirin: Secondary | ICD-10-CM

## 2011-12-18 DIAGNOSIS — J9600 Acute respiratory failure, unspecified whether with hypoxia or hypercapnia: Secondary | ICD-10-CM

## 2011-12-18 DIAGNOSIS — G4700 Insomnia, unspecified: Secondary | ICD-10-CM

## 2011-12-18 DIAGNOSIS — I2510 Atherosclerotic heart disease of native coronary artery without angina pectoris: Secondary | ICD-10-CM | POA: Diagnosis present

## 2011-12-18 DIAGNOSIS — Z9861 Coronary angioplasty status: Secondary | ICD-10-CM

## 2011-12-18 DIAGNOSIS — Z8673 Personal history of transient ischemic attack (TIA), and cerebral infarction without residual deficits: Secondary | ICD-10-CM

## 2011-12-18 DIAGNOSIS — R471 Dysarthria and anarthria: Secondary | ICD-10-CM | POA: Diagnosis present

## 2011-12-18 DIAGNOSIS — Z888 Allergy status to other drugs, medicaments and biological substances status: Secondary | ICD-10-CM

## 2011-12-18 DIAGNOSIS — F1210 Cannabis abuse, uncomplicated: Secondary | ICD-10-CM | POA: Diagnosis present

## 2011-12-18 DIAGNOSIS — J449 Chronic obstructive pulmonary disease, unspecified: Secondary | ICD-10-CM | POA: Diagnosis present

## 2011-12-18 DIAGNOSIS — Z87891 Personal history of nicotine dependence: Secondary | ICD-10-CM

## 2011-12-18 DIAGNOSIS — B20 Human immunodeficiency virus [HIV] disease: Secondary | ICD-10-CM

## 2011-12-18 DIAGNOSIS — R4789 Other speech disturbances: Secondary | ICD-10-CM | POA: Diagnosis present

## 2011-12-18 DIAGNOSIS — D509 Iron deficiency anemia, unspecified: Secondary | ICD-10-CM | POA: Insufficient documentation

## 2011-12-18 DIAGNOSIS — G8190 Hemiplegia, unspecified affecting unspecified side: Secondary | ICD-10-CM | POA: Diagnosis present

## 2011-12-18 DIAGNOSIS — I6350 Cerebral infarction due to unspecified occlusion or stenosis of unspecified cerebral artery: Secondary | ICD-10-CM

## 2011-12-18 DIAGNOSIS — Z21 Asymptomatic human immunodeficiency virus [HIV] infection status: Secondary | ICD-10-CM | POA: Diagnosis present

## 2011-12-18 DIAGNOSIS — I10 Essential (primary) hypertension: Secondary | ICD-10-CM | POA: Diagnosis present

## 2011-12-18 DIAGNOSIS — F1410 Cocaine abuse, uncomplicated: Secondary | ICD-10-CM | POA: Diagnosis present

## 2011-12-18 DIAGNOSIS — J4489 Other specified chronic obstructive pulmonary disease: Secondary | ICD-10-CM | POA: Diagnosis present

## 2011-12-18 DIAGNOSIS — Z79899 Other long term (current) drug therapy: Secondary | ICD-10-CM

## 2011-12-18 DIAGNOSIS — E785 Hyperlipidemia, unspecified: Secondary | ICD-10-CM | POA: Diagnosis present

## 2011-12-18 DIAGNOSIS — R29810 Facial weakness: Secondary | ICD-10-CM | POA: Diagnosis present

## 2011-12-18 DIAGNOSIS — I6340 Cerebral infarction due to embolism of unspecified cerebral artery: Principal | ICD-10-CM | POA: Diagnosis present

## 2011-12-18 DIAGNOSIS — K219 Gastro-esophageal reflux disease without esophagitis: Secondary | ICD-10-CM

## 2011-12-18 DIAGNOSIS — Q211 Atrial septal defect: Secondary | ICD-10-CM

## 2011-12-18 HISTORY — DX: Cerebral infarction, unspecified: I63.9

## 2011-12-18 LAB — URINALYSIS, ROUTINE W REFLEX MICROSCOPIC
Bilirubin Urine: NEGATIVE
Hgb urine dipstick: NEGATIVE
Ketones, ur: 15 mg/dL — AB
Nitrite: NEGATIVE
Protein, ur: 30 mg/dL — AB
Urobilinogen, UA: 0.2 mg/dL (ref 0.0–1.0)

## 2011-12-18 LAB — CK TOTAL AND CKMB (NOT AT ARMC)
CK, MB: 2 ng/mL (ref 0.3–4.0)
Relative Index: 0.8 (ref 0.0–2.5)
Total CK: 240 U/L — ABNORMAL HIGH (ref 7–177)

## 2011-12-18 LAB — DIFFERENTIAL
Basophils Absolute: 0 10*3/uL (ref 0.0–0.1)
Eosinophils Relative: 1 % (ref 0–5)
Lymphocytes Relative: 51 % — ABNORMAL HIGH (ref 12–46)
Lymphs Abs: 2.4 10*3/uL (ref 0.7–4.0)
Monocytes Absolute: 0.6 10*3/uL (ref 0.1–1.0)
Monocytes Relative: 12 % (ref 3–12)
Neutro Abs: 1.7 10*3/uL (ref 1.7–7.7)

## 2011-12-18 LAB — URINE MICROSCOPIC-ADD ON

## 2011-12-18 LAB — POCT I-STAT, CHEM 8
Calcium, Ion: 1.2 mmol/L (ref 1.12–1.32)
Creatinine, Ser: 0.8 mg/dL (ref 0.50–1.10)
Glucose, Bld: 96 mg/dL (ref 70–99)
HCT: 42 % (ref 36.0–46.0)
Hemoglobin: 14.3 g/dL (ref 12.0–15.0)
Potassium: 3.8 mEq/L (ref 3.5–5.1)

## 2011-12-18 LAB — COMPREHENSIVE METABOLIC PANEL
AST: 47 U/L — ABNORMAL HIGH (ref 0–37)
BUN: 7 mg/dL (ref 6–23)
CO2: 24 mEq/L (ref 19–32)
Calcium: 9.5 mg/dL (ref 8.4–10.5)
Chloride: 105 mEq/L (ref 96–112)
Creatinine, Ser: 0.73 mg/dL (ref 0.50–1.10)
GFR calc Af Amer: >90 mL/min (ref >90)
GFR calc non Af Amer: >90 mL/min (ref >90)
Glucose, Bld: 93 mg/dL (ref 70–99)
Total Bilirubin: 0.7 mg/dL (ref 0.3–1.2)

## 2011-12-18 LAB — MRSA PCR SCREENING: MRSA by PCR: NEGATIVE

## 2011-12-18 LAB — CBC
HCT: 39.2 % (ref 36.0–46.0)
Hemoglobin: 12.7 g/dL (ref 12.0–15.0)
MCV: 79.4 fL (ref 78.0–100.0)
RDW: 14.6 % (ref 11.5–15.5)
WBC: 4.8 10*3/uL (ref 4.0–10.5)

## 2011-12-18 LAB — APTT: aPTT: 30 seconds (ref 24–37)

## 2011-12-18 MED ORDER — ACETAMINOPHEN 650 MG RE SUPP
650.0000 mg | RECTAL | Status: DC | PRN
  Administered 2011-12-18: 650 mg via RECTAL
  Filled 2011-12-18: qty 1

## 2011-12-18 MED ORDER — SODIUM CHLORIDE 0.9 % IV SOLN: INTRAVENOUS | Status: AC

## 2011-12-18 MED ORDER — ACETAMINOPHEN 325 MG PO TABS
650.0000 mg | ORAL_TABLET | ORAL | Status: DC | PRN
  Administered 2011-12-20 – 2011-12-26 (×7): 650 mg via ORAL
  Filled 2011-12-18 (×7): qty 2

## 2011-12-18 MED ORDER — FENTANYL CITRATE 0.05 MG/ML IJ SOLN
INTRAMUSCULAR | Status: DC | PRN
  Administered 2011-12-18: 25 ug via INTRAVENOUS

## 2011-12-18 MED ORDER — LABETALOL HCL 5 MG/ML IV SOLN
10.0000 mg | INTRAVENOUS | Status: DC | PRN
  Administered 2011-12-18: 10 mg via INTRAVENOUS
  Filled 2011-12-18: qty 4

## 2011-12-18 MED ORDER — PANTOPRAZOLE SODIUM 40 MG IV SOLR
40.0000 mg | Freq: Every day | INTRAVENOUS | Status: DC
  Administered 2011-12-18 – 2011-12-20 (×3): 40 mg via INTRAVENOUS
  Filled 2011-12-18 (×7): qty 40

## 2011-12-18 MED ORDER — SENNOSIDES-DOCUSATE SODIUM 8.6-50 MG PO TABS
1.0000 | ORAL_TABLET | Freq: Every evening | ORAL | Status: DC | PRN
  Administered 2011-12-23: 1 via ORAL
  Filled 2011-12-18 (×2): qty 1

## 2011-12-18 MED ORDER — ONDANSETRON HCL 4 MG/2ML IJ SOLN: 4.0000 mg | Freq: Four times a day (QID) | INTRAMUSCULAR | Status: DC | PRN

## 2011-12-18 MED ORDER — SODIUM CHLORIDE 0.9 % IJ SOLN
1.5000 mg | INTRAVENOUS | Status: AC
  Filled 2011-12-18: qty 0.3

## 2011-12-18 MED ORDER — ALTEPLASE (STROKE) FULL DOSE INFUSION
90.0000 mg | INTRAVENOUS | Status: AC
  Administered 2011-12-18: 90 mg via INTRAVENOUS
  Filled 2011-12-18: qty 90

## 2011-12-18 MED ORDER — WHITE PETROLATUM GEL
Status: AC
  Filled 2011-12-18: qty 5

## 2011-12-18 MED ORDER — ALTEPLASE (STROKE) FULL DOSE INFUSION: 90.0000 mg | Freq: Once | INTRAVENOUS | Status: DC

## 2011-12-18 MED ORDER — MORPHINE SULFATE 2 MG/ML IJ SOLN
1.0000 mg | INTRAMUSCULAR | Status: DC | PRN
  Administered 2011-12-18: 1 mg via INTRAVENOUS
  Administered 2011-12-18 – 2011-12-22 (×11): 2 mg via INTRAVENOUS
  Filled 2011-12-18 (×13): qty 1

## 2011-12-18 MED ORDER — SODIUM CHLORIDE 0.9 % IV SOLN
INTRAVENOUS | Status: DC
  Administered 2011-12-18: 14:00:00 via INTRAVENOUS

## 2011-12-18 MED ORDER — WHITE PETROLATUM GEL
Status: AC
  Administered 2011-12-18: 18:00:00
  Filled 2011-12-18: qty 5

## 2011-12-18 MED ORDER — MIDAZOLAM HCL 5 MG/5ML IJ SOLN
INTRAMUSCULAR | Status: DC | PRN
  Administered 2011-12-18: 1 mg via INTRAVENOUS

## 2011-12-18 NOTE — Procedures (Signed)
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
## 2011-12-18 NOTE — Progress Notes (Signed)
12/18/11 1454  Discharge Planning  Type of Residence Private residence  Living Arrangements Alone  Home Care Services No  Support Systems Children;Friends/neighbors  Do you have any problems obtaining your medications? No (Pt is with postive payor source)  Once you are discharged, how will you get to your follow-up appointment? Family  Expected Discharge Date 12/24/11  Social Work Consult Needed No (Consult LCSW if needed)   Dionne Milo MSW [facility] Emergency Dept. Weekend/Social Worker [phone]

## 2011-12-18 NOTE — ED Notes (Signed)
Per EMS pt reports at 10am went to go back to bed and discovered left arm not working correctly, pt with left facial droop and left arm weakness, pt with slurred speech

## 2011-12-18 NOTE — ED Notes (Signed)
Pt taken to interventional radiology with nurse present.

## 2011-12-18 NOTE — Code Documentation (Signed)
Code stroke called at 1251, Patient arrived to [facility] at 1300, EDP seen at 21, stroke team arrived at 27, CT scan at 1302 Labs 1307, CT read 1310, Patient to MRI at 1315.  TPa ordered at 1355.  NIHSS 10.  Patient brought in by EMS, as per patient she woke up with a headache and went to lay down at 1000 and noticed she could not move her left side and had slurred speech and her daughter called EMS.  Pt to IR

## 2011-12-18 NOTE — ED Notes (Signed)
TPA FINISHED.
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
## 2011-12-18 NOTE — ED Notes (Signed)
3104-01 Ready
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
## 2011-12-19 ENCOUNTER — Inpatient Hospital Stay (HOSPITAL_COMMUNITY): Payer: Medicare PPO

## 2011-12-19 LAB — HEMOGLOBIN A1C: Hgb A1c MFr Bld: 5.5 % (ref <5.7)

## 2011-12-19 LAB — CBC
MCH: 25.6 pg — ABNORMAL LOW (ref 26.0–34.0)
Platelets: 170 10*3/uL (ref 150–400)
RBC: 4.65 MIL/uL (ref 3.87–5.11)
RDW: 14.7 % (ref 11.5–15.5)
WBC: 4.5 10*3/uL (ref 4.0–10.5)

## 2011-12-19 LAB — LIPID PANEL
Cholesterol: 189 mg/dL (ref 0–200)
Total CHOL/HDL Ratio: 4.7 RATIO

## 2011-12-19 LAB — BASIC METABOLIC PANEL
Calcium: 9.2 mg/dL (ref 8.4–10.5)
Creatinine, Ser: 0.63 mg/dL (ref 0.50–1.10)
GFR calc non Af Amer: >90 mL/min (ref >90)
Glucose, Bld: 81 mg/dL (ref 70–99)
Sodium: 142 mEq/L (ref 135–145)

## 2011-12-19 MED ORDER — STROKE: EARLY STAGES OF RECOVERY BOOK
Freq: Once | Status: AC
  Administered 2011-12-19: 08:00:00
  Filled 2011-12-19: qty 1

## 2011-12-19 MED ORDER — ASPIRIN EC 325 MG PO TBEC
325.0000 mg | DELAYED_RELEASE_TABLET | Freq: Every day | ORAL | Status: DC
  Administered 2011-12-20 – 2011-12-29 (×10): 325 mg via ORAL
  Filled 2011-12-19 (×11): qty 1

## 2011-12-19 MED ORDER — ASPIRIN 300 MG RE SUPP
300.0000 mg | Freq: Every day | RECTAL | Status: DC
  Administered 2011-12-19: 300 mg via RECTAL
  Filled 2011-12-19 (×3): qty 1

## 2011-12-19 MED ORDER — CHLORHEXIDINE GLUCONATE 0.12 % MT SOLN
OROMUCOSAL | Status: AC
  Administered 2011-12-19: 15 mL
  Filled 2011-12-19: qty 15

## 2011-12-19 MED ORDER — BIOTENE DRY MOUTH MT LIQD: 15.0000 mL | Freq: Two times a day (BID) | OROMUCOSAL | Status: DC

## 2011-12-19 MED ORDER — SODIUM CHLORIDE 0.9 % IV SOLN
INTRAVENOUS | Status: DC
  Administered 2011-12-19 – 2011-12-20 (×3): via INTRAVENOUS

## 2011-12-19 MED ORDER — CHLORHEXIDINE GLUCONATE 0.12 % MT SOLN
15.0000 mL | Freq: Two times a day (BID) | OROMUCOSAL | Status: DC
  Administered 2011-12-20: 15 mL via OROMUCOSAL
  Filled 2011-12-19: qty 15

## 2011-12-19 NOTE — Progress Notes (Signed)
SLP Cancellation Note  ST received order for BSE and SLE and will defer to 12/20/11 as patient to remain supine position with bedrest with right sheath in place this date.   Moreen Fowler MS, CCC-SLP [facility] 12/19/2011, 6:06 PM

## 2011-12-19 NOTE — Progress Notes (Signed)
PT Cancellation Note  Treatment cancelled today due to medical issues with patient which prohibited therapy. Pt currently still has sheaths as well as is on strict bedrest. Will attempt evaluation tomorrow pending medical stability.  Thanks. 12/19/2011 Milana Kidney DPT PAGER: [phone] OFFICE: [phone]    Milana Kidney 12/19/2011, 7:29 AM

## 2011-12-19 NOTE — Progress Notes (Signed)
*  PRELIMINARY RESULTS* Vascular Ultrasound Carotid Duplex (Doppler) has been completed.  Preliminary findings: Bilaterally no significant ICA stenosis with antegrade vertebral flow.  Farrel Demark, RDMS 12/19/2011, 9:42 AM

## 2011-12-19 NOTE — Progress Notes (Signed)
Patient ID: [name], female   DOB: Aug 14, 1961, 51 y.o.   MRN: [id number] Stroke Team Progress Note  HISTORY [name] is an 51 y.o. woman who presented on 4/13 with slurred speech, aphasia, left facial droop and left hemiparesis. Acute CVA was seen on MRI in the right temporal region. Presenting NIHSS of 11. She received IV tPA and was taken to IR but no intervention could be performed.  SUBJECTIVE Patient is resting comfortably this morning. She is still supine on bedrest as right sheath is still in. She denies new symptoms.  OBJECTIVE Most recent Vital Signs: Temp: 98.4 F (36.9 C) (04/14 0800) Temp src: Oral (04/14 0800) BP: 64/48 mmHg (04/14 0600) Pulse Rate: 84  (04/14 0700) Respiratory Rate: 16 O2 Saturation: 100%  CBG (last 3) No results found for this basename: GLUCAP:3 in the last 72 hours Intake/Output from previous day: 04/13 0701 - 04/14 0700 In: 1125 [I.V.:1125] Out: 685 [Urine:685]  IV Fluid Intake:     . sodium chloride 75 mL/hr at 12/18/11 1800  . sodium chloride 75 mL/hr at 12/19/11 0800  . DISCONTD: sodium chloride 75 mL/hr at 12/18/11 1600   Medications    .  stroke: mapping our early stages of recovery book   Does not apply Once  . alteplase  90 mg Intravenous STAT  . nitroGLYCERIN 1.5mg /37ml(25 mcg/ml) - [facility]-IR  1.5 mg Intra-arterial to XRAY  . pantoprazole (PROTONIX) IV  40 mg Intravenous QHS  . white petrolatum      . white petrolatum      . DISCONTD: alteplase  90 mg Intravenous Once  PRN acetaminophen, acetaminophen, fentaNYL, labetalol, midazolam, morphine injection, ondansetron (ZOFRAN) IV, senna-docusate  Diet:  NPO  Activity:  Bedrest due to right sheath in place. DVT Prophylaxis:  SCDs now. Will start Lovenox tomorrow.  Studies: CBC    Component Value Date/Time   WBC 4.5 12/19/2011 0510   RBC 4.65 12/19/2011 0510   HGB 11.9* 12/19/2011 0510   HCT 36.9 12/19/2011 0510   PLT 170 12/19/2011 0510   MCV 79.4 12/19/2011 0510   MCH  25.6* 12/19/2011 0510   MCHC 32.2 12/19/2011 0510   RDW 14.7 12/19/2011 0510   LYMPHSABS 2.4 12/18/2011 1320   MONOABS 0.6 12/18/2011 1320   EOSABS 0.1 12/18/2011 1320   BASOSABS 0.0 12/18/2011 1320   CMP    Component Value Date/Time   NA 142 12/19/2011 0510   K 3.7 12/19/2011 0510   CL 109 12/19/2011 0510   CO2 22 12/19/2011 0510   GLUCOSE 81 12/19/2011 0510   BUN 7 12/19/2011 0510   CREATININE 0.63 12/19/2011 0510   CREATININE 0.64 11/23/2011 1144   CALCIUM 9.2 12/19/2011 0510   PROT 8.4* 12/18/2011 1320   ALBUMIN 3.6 12/18/2011 1320   AST 47* 12/18/2011 1320   ALT 42* 12/18/2011 1320   ALKPHOS 46 12/18/2011 1320   BILITOT 0.7 12/18/2011 1320   GFRNONAA >90 12/19/2011 0510   GFRAA >90 12/19/2011 0510   COAGS Lab Results  Component Value Date   INR 0.98 12/18/2011   INR 0.98 12/22/2010   INR 1.00 01/24/2010   Lipid Panel    Component Value Date/Time   CHOL 189 12/19/2011 0510   TRIG 77 12/19/2011 0510   HDL 40 12/19/2011 0510   CHOLHDL 4.7 12/19/2011 0510   VLDL 15 12/19/2011 0510   LDLCALC 134* 12/19/2011 0510   HgbA1C  Lab Results  Component Value Date   HGBA1C  Value: 5.9 (NOTE)  According to the ADA Clinical Practice Recommendations for 2011, when HbA1c is used as a screening test:   >=6.5%   Diagnostic of Diabetes Mellitus           (if abnormal result  is confirmed)  5.7-6.4%   Increased risk of developing Diabetes Mellitus  References:Diagnosis and Classification of Diabetes Mellitus,Diabetes Care,2011,34(Suppl 1):S62-S69 and Standards of Medical Care in         Diabetes - 2011,Diabetes Care,2011,34  (Suppl 1):S11-S61.* 12/22/2010   Urine Drug Screen     Component Value Date/Time   LABOPIA POSITIVE* 05/24/2011 1800   LABOPIA NEGATIVE 05/23/2009 0542   COCAINSCRNUR POSITIVE* 05/24/2011 1800   COCAINSCRNUR NEGATIVE 05/23/2009 0542   LABBENZ NONE DETECTED 05/24/2011 1800   LABBENZ NEGATIVE 05/23/2009 0542   AMPHETMU NONE  DETECTED 05/24/2011 1800   AMPHETMU NEGATIVE 05/23/2009 0542   THCU POSITIVE* 05/24/2011 1800   LABBARB NONE DETECTED 05/24/2011 1800    Alcohol Level    Component Value Date/Time   ETH  Value: <11        LOWEST DETECTABLE LIMIT FOR SERUM ALCOHOL IS 5 mg/dL FOR MEDICAL PURPOSES ONLY* 01/20/2011 1144     Results for orders placed during the hospital encounter of 12/18/11 (from the past 24 hour(s))  PROTIME-INR     Status: Normal   Collection Time   12/18/11  1:20 PM      Component Value Range   Prothrombin Time 13.2  11.6 - 15.2 (seconds)   INR 0.98  0.00 - 1.49   APTT     Status: Normal   Collection Time   12/18/11  1:20 PM      Component Value Range   aPTT 30  24 - 37 (seconds)  CBC     Status: Abnormal   Collection Time   12/18/11  1:20 PM      Component Value Range   WBC 4.8  4.0 - 10.5 (K/uL)   RBC 4.94  3.87 - 5.11 (MIL/uL)   Hemoglobin 12.7  12.0 - 15.0 (g/dL)   HCT 04.5  40.9 - 81.1 (%)   MCV 79.4  78.0 - 100.0 (fL)   MCH 25.7 (*) 26.0 - 34.0 (pg)   MCHC 32.4  30.0 - 36.0 (g/dL)   RDW 91.4  78.2 - 95.6 (%)   Platelets 178  150 - 400 (K/uL)  DIFFERENTIAL     Status: Abnormal   Collection Time   12/18/11  1:20 PM      Component Value Range   Neutrophils Relative 36 (*) 43 - 77 (%)   Neutro Abs 1.7  1.7 - 7.7 (K/uL)   Lymphocytes Relative 51 (*) 12 - 46 (%)   Lymphs Abs 2.4  0.7 - 4.0 (K/uL)   Monocytes Relative 12  3 - 12 (%)   Monocytes Absolute 0.6  0.1 - 1.0 (K/uL)   Eosinophils Relative 1  0 - 5 (%)   Eosinophils Absolute 0.1  0.0 - 0.7 (K/uL)   Basophils Relative 0  0 - 1 (%)   Basophils Absolute 0.0  0.0 - 0.1 (K/uL)  COMPREHENSIVE METABOLIC PANEL     Status: Abnormal   Collection Time   12/18/11  1:20 PM      Component Value Range   Sodium 139  135 - 145 (mEq/L)   Potassium 3.5  3.5 - 5.1 (mEq/L)   Chloride 105  96 - 112 (mEq/L)   CO2 24  19 - 32 (mEq/L)   Glucose, Bld 93  70 - 99 (mg/dL)   BUN 7  6 - 23 (mg/dL)   Creatinine, Ser 9.14  0.50 - 1.10 (mg/dL)    Calcium 9.5  8.4 - 10.5 (mg/dL)   Total Protein 8.4 (*) 6.0 - 8.3 (g/dL)   Albumin 3.6  3.5 - 5.2 (g/dL)   AST 47 (*) 0 - 37 (U/L)   ALT 42 (*) 0 - 35 (U/L)   Alkaline Phosphatase 46  39 - 117 (U/L)   Total Bilirubin 0.7  0.3 - 1.2 (mg/dL)   GFR calc non Af Amer >90  >90 (mL/min)   GFR calc Af Amer >90  >90 (mL/min)  CK TOTAL AND CKMB     Status: Abnormal   Collection Time   12/18/11  1:20 PM      Component Value Range   Total CK 240 (*) 7 - 177 (U/L)   CK, MB 2.0  0.3 - 4.0 (ng/mL)   Relative Index 0.8  0.0 - 2.5   TROPONIN I     Status: Normal   Collection Time   12/18/11  1:20 PM      Component Value Range   Troponin I <0.30  <0.30 (ng/mL)  POCT I-STAT, CHEM 8     Status: Abnormal   Collection Time   12/18/11  1:20 PM      Component Value Range   Sodium 142  135 - 145 (mEq/L)   Potassium 3.8  3.5 - 5.1 (mEq/L)   Chloride 108  96 - 112 (mEq/L)   BUN 5 (*) 6 - 23 (mg/dL)   Creatinine, Ser 7.82  0.50 - 1.10 (mg/dL)   Glucose, Bld 96  70 - 99 (mg/dL)   Calcium, Ion 9.56  1.12 - 1.32 (mmol/L)   TCO2 24  0 - 100 (mmol/L)   Hemoglobin 14.3  12.0 - 15.0 (g/dL)   HCT 21.3  08.6 - 57.8 (%)  URINALYSIS, ROUTINE W REFLEX MICROSCOPIC     Status: Abnormal   Collection Time   12/18/11  2:32 PM      Component Value Range   Color, Urine YELLOW  YELLOW    APPearance CLOUDY (*) CLEAR    Specific Gravity, Urine 1.015  1.005 - 1.030    pH 6.5  5.0 - 8.0    Glucose, UA NEGATIVE  NEGATIVE (mg/dL)   Hgb urine dipstick NEGATIVE  NEGATIVE    Bilirubin Urine NEGATIVE  NEGATIVE    Ketones, ur 15 (*) NEGATIVE (mg/dL)   Protein, ur 30 (*) NEGATIVE (mg/dL)   Urobilinogen, UA 0.2  0.0 - 1.0 (mg/dL)   Nitrite NEGATIVE  NEGATIVE    Leukocytes, UA NEGATIVE  NEGATIVE   URINE MICROSCOPIC-ADD ON     Status: Normal   Collection Time   12/18/11  2:32 PM      Component Value Range   Squamous Epithelial / LPF RARE  RARE   MRSA PCR SCREENING     Status: Normal   Collection Time   12/18/11  4:29 PM       Component Value Range   MRSA by PCR NEGATIVE  NEGATIVE   BASIC METABOLIC PANEL     Status: Normal   Collection Time   12/19/11  5:10 AM      Component Value Range   Sodium 142  135 - 145 (mEq/L)   Potassium 3.7  3.5 - 5.1 (mEq/L)   Chloride 109  96 - 112 (mEq/L)   CO2 22  19 - 32 (mEq/L)  Glucose, Bld 81  70 - 99 (mg/dL)   BUN 7  6 - 23 (mg/dL)   Creatinine, Ser 1.91  0.50 - 1.10 (mg/dL)   Calcium 9.2  8.4 - 47.8 (mg/dL)   GFR calc non Af Amer >90  >90 (mL/min)   GFR calc Af Amer >90  >90 (mL/min)  CBC     Status: Abnormal   Collection Time   12/19/11  5:10 AM      Component Value Range   WBC 4.5  4.0 - 10.5 (K/uL)   RBC 4.65  3.87 - 5.11 (MIL/uL)   Hemoglobin 11.9 (*) 12.0 - 15.0 (g/dL)   HCT 29.5  62.1 - 30.8 (%)   MCV 79.4  78.0 - 100.0 (fL)   MCH 25.6 (*) 26.0 - 34.0 (pg)   MCHC 32.2  30.0 - 36.0 (g/dL)   RDW 65.7  84.6 - 96.2 (%)   Platelets 170  150 - 400 (K/uL)  LIPID PANEL     Status: Abnormal   Collection Time   12/19/11  5:10 AM      Component Value Range   Cholesterol 189  0 - 200 (mg/dL)   Triglycerides 77  <952 (mg/dL)   HDL 40  >84 (mg/dL)   Total CHOL/HDL Ratio 4.7     VLDL 15  0 - 40 (mg/dL)   LDL Cholesterol 132 (*) 0 - 99 (mg/dL)    Ct Head Wo Contrast  12/18/2011  *RADIOLOGY REPORT*  Clinical Data: 51 year old female with new left-sided weakness and dysphasia  CT HEAD WITHOUT CONTRAST  Technique:  Contiguous axial images were obtained from the base of the skull through the vertex without contrast.  Comparison: 05/31/2008  Findings: A remote right cerebellar infarct is present.  No acute intracranial abnormalities are identified, including mass lesion or mass effect, hydrocephalus, extra-axial fluid collection, midline shift, hemorrhage, or acute infarction.  The visualized bony calvarium is unremarkable.  IMPRESSION: No evidence of acute intracranial abnormality.  Remote right cerebellar infarct.  Critical Value/emergent results were called by telephone at  the time of interpretation on 12/18/2011  at 1:20 p.m.  to  the neurologist on call, who verbally acknowledged these results.  Original Report Authenticated By: Rosendo Gros, M.D.   Mr Brain Wo Contrast  12/18/2011  *RADIOLOGY REPORT*  Clinical Data: Code stroke.  HIV.  Slurred speech.  MRI HEAD WITHOUT CONTRAST  Technique:  Multiplanar, multiecho pulse sequences of the brain and surrounding structures were obtained according to standard protocol without intravenous contrast.  Comparison: CT head without contrast 12/18/2011.  Findings: A focal area of restricted diffusion is noted along the sylvian fissure and posterior right frontal lobe.  There is no hemorrhage.  There is no significant T2 signal associated.  The study is mildly degraded by patient motion.  Minimal subcortical white matter disease is potentially within normal limits for age. The ventricles are of normal size.  No significant extra-axial fluid collection is present.  A remote linear infarct is present in the right cerebellum.  No hemorrhage or mass lesion is present.  Flow is present in the major intracranial arteries.  The patient is status post right lens extraction and banding.  The globes and orbits are otherwise intact.  The paranasal sinuses and mastoid air cells are clear.  IMPRESSION:  1.  Acute non hemorrhagic infarct involving the posterior right insular cortex and posterior right frontal lobe. 2.  Minimal white matter disease may be within normal limits for age. 3.  Postoperative  changes of the right globe. 4.  Remote infarct of the right cerebellum.  Original Report Authenticated By: Jamesetta Orleans. MATTERN, M.D.   Dg Chest Port 1 View  12/18/2011  *RADIOLOGY REPORT*  Clinical Data: Code stroke.  Weakness.  PORTABLE CHEST - 1 VIEW  Comparison: Two-view chest 12/09/2011.  Findings: Low lung volumes exaggerate the heart size.  No focal airspace disease is evident.  The visualized soft tissues and bony thorax are unremarkable.   IMPRESSION:  1.  Low lung volumes. 2.  No acute cardiopulmonary disease.  Original Report Authenticated By: Jamesetta Orleans. MATTERN, M.D.    Cerebral Angio S/P bilateral carotid and lt vert artery angiogram Rt CFA appoach Findings: 1.Distal rt MCA post perisylvian branch occlusion  2D Echocardiogram  Ordered.  Carotid Doppler/TCD  Ordered.  Physical Exam   GENERAL:   Well nourished, well hydrated, no acute distress.   CARDIOVASCULAR:   Regular rate and rhythm, no thrills or palpable murmurs, S1, S2, no murmur, no rubs or gallops.      Carotid arteries: No carotid bruits.   RESPIRATORY:  Clear to auscultation bilaterally, no wheezes, rhonci or rales  ABDOMEN:   Soft, non-tender, non-distended, bowel sounds present, no rebound or guarding  EXTREMITIES:  No rashes or lesions     No peripheral edema, cyanosis, or clubbing   MENTAL STATUS EXAM:    Orientation:  Alert and oriented to person, place and time.      Memory:  Cooperative, follows commands well.  Recent and remote memory normal.      Attention, concentration:  Attention span and concentration are normal.      Language:  Speech is moderately dysarthric and language is normal.       CRANIAL NERVES:     CN 2 (Optic):  Visual fields intact to confrontation.     CN 3,4,6 (EOM):  Pupils equal and reactive to light and near full eye movement without nystagmus.      CN 5 (Trigeminal):  Facial sensation is normal, no weakness of masticatory muscles.      CN 7 (Facial):  Left facial droop.      CN 8 (Auditory):  Auditory acuity grossly normal.      CN 9,10 (Glossophar):  The uvula is midline, the palate elevates symmetrically.      CN 11 (spinal access):  Normal sternocleidomastoid and trapezius strength.      CN 12 (Hypoglossal):  The tongue is midline. No atrophy or fasciculations.   MOTOR:    Deltoids:            (R): 5  (L): 2      Biceps:                       (R): 5  (L): 2      Triceps:                       (R): 5  (L): 2         Wrist Extensors:         (R): 5  (L): 2      Wrist Flexors:      (R): 5  (L): 2      Hip Flexors:                       (R): 5  (L): 3      Quadriceps:                       (  R): 5  (L): 3      Hamstrings:                       (R): 5  (L): 3      Tibialis Anterior:                 (R): 5  (L): 4      Medial Gastrocnemius:     (R): 5  (L): 4  Muscle Tone: Tone and muscle bulk are normal in the upper and lower extremities.  REFLEXES:     Triceps:                 (R): 1+  (L): 1+      Biceps:                  (R): 1+  (L): 1+      Brachioradialis:     (R): 1+  (L): 1+      Patellar:                 (R): 1+  (L): 1+      Achilles:                 (R): 1+  (L): 1+      Babinski:    (R): absent  (L): absent   COORDINATION:     Intact finger-to-nose on the right.  SENSATION:     Decreased to light touch on left compared to right.   GAIT:     Deferred due to weakness.  ASSESSMENT [name] is a 51 y.o. female with a right insular and right posterior frontal infarct status post IV tPA with little improvement.  Stroke risk factors:  hyperlipidemia, hypertension and drug use (cocaine and THC positive) and HIV  Hospital day # 1  TREATMENT/PLAN -Will start ASA 325mg  daily today for antiplatelet therapy. -Continue bedrest until IR is able to remove sheath.  -Continue NPO until speech eval (waiting for sheath removal). -PT/OT/ST evals. -TTE and carotid ultrasound are pending. -Repeat CT today to reevaluate after interventional attempt. -Social work consult for discharge planning and drug abuse counseling. -Will start Lovenox for DVT ppx tomorrow.  This patient is critically ill and at significant risk of neurological worsening and death. Patient care requires constant monitoring of vital signs, hemodynamics, respiratory and cardiac monitoring, and neurological assessment. Discussion with family, other specialists about plan of care. Medical decision making of high complexity.  I spent 30 minutes of neurocritical care time in the care of this patient.   Kipp Laurence, MD [facility] Pager: [phone] 12/19/2011 8:34 AM

## 2011-12-19 NOTE — Progress Notes (Signed)
Defer management to IR & neurology - PCCM to sign off  [name] V.

## 2011-12-20 DIAGNOSIS — I517 Cardiomegaly: Secondary | ICD-10-CM

## 2011-12-20 LAB — URINE CULTURE: Culture: NO GROWTH

## 2011-12-20 MED ORDER — PANTOPRAZOLE SODIUM 40 MG PO TBEC
40.0000 mg | DELAYED_RELEASE_TABLET | Freq: Every day | ORAL | Status: DC
  Administered 2011-12-20 – 2011-12-29 (×11): 40 mg via ORAL
  Filled 2011-12-20 (×10): qty 1

## 2011-12-20 MED ORDER — TRAZODONE HCL 50 MG PO TABS
50.0000 mg | ORAL_TABLET | Freq: Every day | ORAL | Status: DC
  Administered 2011-12-20 – 2011-12-28 (×8): 50 mg via ORAL
  Filled 2011-12-20 (×12): qty 1

## 2011-12-20 MED ORDER — DARUNAVIR ETHANOLATE 800 MG PO TABS
800.0000 mg | ORAL_TABLET | Freq: Every day | ORAL | Status: DC
  Administered 2011-12-21 – 2011-12-29 (×9): 800 mg via ORAL
  Filled 2011-12-20 (×10): qty 1

## 2011-12-20 MED ORDER — EMTRICITABINE-TENOFOVIR DF 200-300 MG PO TABS
1.0000 | ORAL_TABLET | Freq: Every day | ORAL | Status: DC
  Administered 2011-12-21 – 2011-12-29 (×9): 1 via ORAL
  Filled 2011-12-20 (×10): qty 1

## 2011-12-20 MED ORDER — CARVEDILOL 3.125 MG PO TABS
3.1250 mg | ORAL_TABLET | Freq: Two times a day (BID) | ORAL | Status: DC
  Administered 2011-12-21 – 2011-12-29 (×16): 3.125 mg via ORAL
  Filled 2011-12-20 (×21): qty 1

## 2011-12-20 MED ORDER — VALACYCLOVIR HCL 500 MG PO TABS
500.0000 mg | ORAL_TABLET | Freq: Two times a day (BID) | ORAL | Status: DC
  Administered 2011-12-20 – 2011-12-29 (×18): 500 mg via ORAL
  Filled 2011-12-20 (×21): qty 1

## 2011-12-20 MED ORDER — GABAPENTIN 400 MG PO CAPS
400.0000 mg | ORAL_CAPSULE | Freq: Three times a day (TID) | ORAL | Status: DC
  Administered 2011-12-20 – 2011-12-29 (×25): 400 mg via ORAL
  Filled 2011-12-20 (×30): qty 1

## 2011-12-20 MED ORDER — RITONAVIR 100 MG PO CAPS
100.0000 mg | ORAL_CAPSULE | Freq: Two times a day (BID) | ORAL | Status: DC
  Administered 2011-12-20 – 2011-12-29 (×18): 100 mg via ORAL
  Filled 2011-12-20 (×21): qty 1

## 2011-12-20 MED ORDER — ISOSORBIDE MONONITRATE ER 60 MG PO TB24
60.0000 mg | ORAL_TABLET | Freq: Every day | ORAL | Status: DC
  Administered 2011-12-21 – 2011-12-29 (×8): 60 mg via ORAL
  Filled 2011-12-20 (×10): qty 1

## 2011-12-20 MED FILL — Heparin Sodium (Porcine) Inj 1000 Unit/ML: INTRAMUSCULAR | Qty: 10 | Status: AC
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
## 2011-12-20 NOTE — Progress Notes (Signed)
INITIAL ADULT NUTRITION ASSESSMENT Date: 12/20/2011   Time: 4:11 PM Reason for Assessment: Screened at nutrition risk for dysphagia  ASSESSMENT: Female 51 y.o.  Dx: slurred speech, aphasia, left facial droop, hemiparesis  Hx:  Past Medical History  Diagnosis Date  . Myocardial infarction   . Cholecystitis     Gall bladder removed   . Hypercholesterolemia   . Fibroids   . HIV (human immunodeficiency virus infection)     Related Meds:  Scheduled Meds:   . aspirin EC  325 mg Oral Daily  . aspirin  300 mg Rectal Daily  . chlorhexidine      . pantoprazole (PROTONIX) IV  40 mg Intravenous QHS  . DISCONTD: antiseptic oral rinse  15 mL Mouth Rinse q12n4p  . DISCONTD: chlorhexidine  15 mL Mouth Rinse BID   Continuous Infusions:   . sodium chloride 75 mL/hr at 12/20/11 1500   PRN Meds:.acetaminophen, acetaminophen, fentaNYL, labetalol, midazolam, morphine injection, ondansetron (ZOFRAN) IV, senna-docusate   Ht: 5\' 2"  (157.5 cm)  Wt: 254 lb 3.1 oz (115.3 kg)  Ideal Wt: 50 kg % Ideal Wt: 230.9%  Usual Wt: 261 per patient on March 19. % Usual Wt: 97.3% * 7 lb. Unintentional weight loss in less than 1 month. (2.6% from baseline)  Body mass index is 46.49 kg/(m^2). (Extreme Obesity)   Food/Nutrition Related Hx: Patient denies any problems with chewing or swallowing. Patient stated she has been following a vegetarian diet for the last 13 days. She stated she has had a good appetite and good PO intake. Per RN patient without any nutrition problems at this time.   Labs:  CMP     Component Value Date/Time   NA 142 12/19/2011 0510   K 3.7 12/19/2011 0510   CL 109 12/19/2011 0510   CO2 22 12/19/2011 0510   GLUCOSE 81 12/19/2011 0510   BUN 7 12/19/2011 0510   CREATININE 0.63 12/19/2011 0510   CREATININE 0.64 11/23/2011 1144   CALCIUM 9.2 12/19/2011 0510   PROT 8.4* 12/18/2011 1320   ALBUMIN 3.6 12/18/2011 1320   AST 47* 12/18/2011 1320   ALT 42* 12/18/2011 1320   ALKPHOS 46  12/18/2011 1320   BILITOT 0.7 12/18/2011 1320   GFRNONAA >90 12/19/2011 0510   GFRAA >90 12/19/2011 0510    Intake/Output Summary (Last 24 hours) at 12/20/11 1618 Last data filed at 12/20/11 1600  Gross per 24 hour  Intake   1800 ml  Output    985 ml  Net    815 ml     Diet Order: Cardiac/ NPO at midnight  Supplements/Tube Feeding: none at this time  IVF:    sodium chloride Last Rate: 75 mL/hr at 12/20/11 1500   Patient not at nutrition risk at this time.  RD to follow for nutrition needs.   Iven Finn [facility] 12/20/2011, 4:11 PM  [phone]
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
## 2011-12-20 NOTE — Progress Notes (Signed)
  Echocardiogram 2D Echocardiogram has been performed.  [name] [facility] 12/20/2011, 12:00 PM
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
## 2011-12-20 NOTE — Progress Notes (Signed)
Stroke Team Progress Note  HISTORY [name] is an 51 y.o. Female with multiple comorbidities including HIV, and coronary artery disease as well as hyperlipidemia who presented to the emergency room on 12/18/2011 with slurred speech, aphasia, left facial droop, and left hemiparesis. Diagnostic evaluation demonstrated acute infarct by MRI affecting primarily the  Right temporal region. Her NIH stroke scale score was 11 on presentation. She received IV tPA and was taken to IR but no intervention could be performed. She was admitted to the neuro ICU for further evaluation and treatment.  SUBJECTIVE No family is at the bedside.  Overall she feels her condition is unchanged. She has seen Dr.Tilley in the past. She does not want follow up with him.  OBJECTIVE Most recent Vital Signs: Filed Vitals:   12/20/11 0400 12/20/11 0500 12/20/11 0600 12/20/11 0700  BP: 124/91 127/78 121/89 131/74  Pulse: 79 74 73 75  Temp: 98.1 F (36.7 C)     TempSrc: Oral     Resp: 20 11 12 16   Height:      Weight:      SpO2: 100% 100% 100% 100%   CBG (last 3)  No results found for this basename: GLUCAP:3 in the last 72 hours Intake/Output from previous day: 04/14 0701 - 04/15 0700 In: 1800 [I.V.:1800] Out: 950 [Urine:950]  IV Fluid Intake:     . sodium chloride 75 mL/hr at 12/19/11 0800   MEDICATIONS    . antiseptic oral rinse  15 mL Mouth Rinse q12n4p  . aspirin EC  325 mg Oral Daily  . aspirin  300 mg Rectal Daily  . chlorhexidine  15 mL Mouth Rinse BID  . chlorhexidine      . nitroGLYCERIN 1.5mg /94ml(25 mcg/ml) - MC-IR  1.5 mg Intra-arterial to XRAY  . pantoprazole (PROTONIX) IV  40 mg Intravenous QHS  . white petrolatum       PRN:  acetaminophen, acetaminophen, fentaNYL, labetalol, midazolam, morphine injection, ondansetron (ZOFRAN) IV, senna-docusate  Diet:  NPO  Activity:  Bedrest DVT Prophylaxis:  SCDs   CLINICALLY SIGNIFICANT STUDIES CBC    Component Value Date/Time   WBC 4.5  12/19/2011 0510   RBC 4.65 12/19/2011 0510   HGB 11.9* 12/19/2011 0510   HCT 36.9 12/19/2011 0510   PLT 170 12/19/2011 0510   MCV 79.4 12/19/2011 0510   MCH 25.6* 12/19/2011 0510   MCHC 32.2 12/19/2011 0510   RDW 14.7 12/19/2011 0510   LYMPHSABS 2.4 12/18/2011 1320   MONOABS 0.6 12/18/2011 1320   EOSABS 0.1 12/18/2011 1320   BASOSABS 0.0 12/18/2011 1320   CMP    Component Value Date/Time   NA 142 12/19/2011 0510   K 3.7 12/19/2011 0510   CL 109 12/19/2011 0510   CO2 22 12/19/2011 0510   GLUCOSE 81 12/19/2011 0510   BUN 7 12/19/2011 0510   CREATININE 0.63 12/19/2011 0510   CREATININE 0.64 11/23/2011 1144   CALCIUM 9.2 12/19/2011 0510   PROT 8.4* 12/18/2011 1320   ALBUMIN 3.6 12/18/2011 1320   AST 47* 12/18/2011 1320   ALT 42* 12/18/2011 1320   ALKPHOS 46 12/18/2011 1320   BILITOT 0.7 12/18/2011 1320   GFRNONAA >90 12/19/2011 0510   GFRAA >90 12/19/2011 0510   COAGS Lab Results  Component Value Date   INR 0.98 12/18/2011   INR 0.98 12/22/2010   INR 1.00 01/24/2010   Lipid Panel    Component Value Date/Time   CHOL 189 12/19/2011 0510   TRIG 77 12/19/2011 0510  HDL 40 12/19/2011 0510   CHOLHDL 4.7 12/19/2011 0510   VLDL 15 12/19/2011 0510   LDLCALC 134* 12/19/2011 0510   HgbA1C  Lab Results  Component Value Date   HGBA1C 5.5 12/19/2011   Cardiac Panel (last 3 results)  Basename 12/18/11 1320  CKTOTAL 240*  CKMB 2.0  TROPONINI <0.30  RELINDX 0.8   Urinalysis    Component Value Date/Time   COLORURINE YELLOW 12/18/2011 1432   APPEARANCEUR CLOUDY* 12/18/2011 1432   LABSPEC 1.015 12/18/2011 1432   PHURINE 6.5 12/18/2011 1432   GLUCOSEU NEGATIVE 12/18/2011 1432   HGBUR NEGATIVE 12/18/2011 1432   BILIRUBINUR NEGATIVE 12/18/2011 1432   KETONESUR 15* 12/18/2011 1432   PROTEINUR 30* 12/18/2011 1432   UROBILINOGEN 0.2 12/18/2011 1432   NITRITE NEGATIVE 12/18/2011 1432   LEUKOCYTESUR NEGATIVE 12/18/2011 1432   Urine Drug Screen    Component Value Date/Time   LABOPIA POSITIVE* 05/24/2011 1800   LABOPIA  NEGATIVE 05/23/2009 0542   COCAINSCRNUR POSITIVE* 05/24/2011 1800   COCAINSCRNUR NEGATIVE 05/23/2009 0542   LABBENZ NONE DETECTED 05/24/2011 1800   LABBENZ NEGATIVE 05/23/2009 0542   AMPHETMU NONE DETECTED 05/24/2011 1800   AMPHETMU NEGATIVE 05/23/2009 0542   THCU POSITIVE* 05/24/2011 1800   LABBARB NONE DETECTED 05/24/2011 1800    Alcohol Level    Component Value Date/Time   ETH  Value: <11        LOWEST DETECTABLE LIMIT FOR SERUM ALCOHOL IS 5 mg/dL FOR MEDICAL PURPOSES ONLY* 01/20/2011 1144   CT of the brain   No evidence of acute intracranial abnormality.  Remote right cerebellar infarct.   Cerebral angio  S/P bilateral caritid and lt vert artery angiogram, Rt CFA approach, Distal rt MCA post perisylvian branch occlusion.  MRI of the brain   12/19/2011 1.  The study is markedly degraded by patient motion, exaggerating medium and small vessel disease. 2.  The focal occluded right MCA branch vessel is below the sensitivity of this exam 12/18/2011  1.  Acute non hemorrhagic infarct involving the posterior right insular cortex and posterior right frontal lobe. 2.  Minimal white matter disease may be within normal limits for age. 3.  Postoperative changes of the right globe. 4.  Remote infarct of the right cerebellum.   MRA of the brain   1.  The study is markedly degraded by patient motion, exaggerating medium and small vessel disease. 2.  The focal occluded right MCA branch vessel is below the sensitivity of this exam.   2D Echocardiogram  ordered   Carotid Doppler  No internal carotid artery stenosis bilaterally. Vertebrals with antegrade flow bilaterally.   CXR   1.  Low lung volumes. 2.  No acute cardiopulmonary disease.   EKG  normal sinus rhythm.   Physical Exam    Awake alert. Afebrile. Head is nontraumatic. Neck is supple without bruit. Hearing is normal. Cardiac exam no murmur or gallop. Lungs are clear to auscultation. Distal  but severe dysarthria. Moderate left lower face asymmetry.  Tongue midline.Pronounced left sided  Drift.Left hemiparesis with 3/5 strength on left. Mild diminished fine finger movements on left. Orbits right over left upper extremity. Mild left grip weak.. Diminished left hemibody sensation .Impaired coordination on left. Gait deferred. ASSESSMENT [name] is a 51 y.o. 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 Status post IV t-PA 12/18/2011 at 1420.  On aspirin 81 mg orally every day prior to admission. Now on aspirin 300mg  rectally every  day for secondary stroke prevention. Patient with resultant left facial droop, left hemiparesis, slurred speech and decreased sensation left side.  -opiates, cocaine, THC -hyperlipidemia -hypertension -HIV -CAD s/p stents Hospital day # 2  TREATMENT/PLAN -Continue aspirin 300 mg rectally every day for secondary stroke prevention. Change to po if able to swallow. -ST assess swallow. -transfer to the floor -TEE am -OOB. Therapy evals. Rehab consult. -social worker following for substance abuse  Joaquin Music, ANP-BC, GNP-BC [facility] Pager: [phone] 12/20/2011 7:53 AM  Dr. Delia Heady, Stroke Center Medical Director, has personally reviewed chart, pertinent data, examined the patient and developed the plan of care.

## 2011-12-21 ENCOUNTER — Encounter (HOSPITAL_COMMUNITY): Payer: Self-pay | Admitting: *Deleted

## 2011-12-21 ENCOUNTER — Encounter (HOSPITAL_COMMUNITY)
Admission: EM | Disposition: A | Discharge status: Skilled Nursing Facility | Payer: Self-pay | Source: Ambulatory Visit | Attending: Neurology

## 2011-12-21 DIAGNOSIS — I6350 Cerebral infarction due to unspecified occlusion or stenosis of unspecified cerebral artery: Secondary | ICD-10-CM

## 2011-12-21 DIAGNOSIS — I2699 Other pulmonary embolism without acute cor pulmonale: Secondary | ICD-10-CM

## 2011-12-21 HISTORY — PX: TEE WITHOUT CARDIOVERSION: SHX5443

## 2011-12-21 SURGERY — ECHOCARDIOGRAM, TRANSESOPHAGEAL
Anesthesia: Moderate Sedation

## 2011-12-21 MED ORDER — FENTANYL CITRATE 0.05 MG/ML IJ SOLN
INTRAMUSCULAR | Status: AC
  Filled 2011-12-21: qty 2

## 2011-12-21 MED ORDER — BUTAMBEN-TETRACAINE-BENZOCAINE 2-2-14 % EX AERO
INHALATION_SPRAY | CUTANEOUS | Status: DC | PRN
  Administered 2011-12-21: 1 via TOPICAL

## 2011-12-21 MED ORDER — SODIUM CHLORIDE 0.9 % IV SOLN: 250.0000 mL | INTRAVENOUS | Status: DC | PRN

## 2011-12-21 MED ORDER — MIDAZOLAM HCL 10 MG/2ML IJ SOLN
INTRAMUSCULAR | Status: DC | PRN
  Administered 2011-12-21: 2 mg via INTRAVENOUS

## 2011-12-21 MED ORDER — MIDAZOLAM HCL 10 MG/2ML IJ SOLN: 10.0000 mg | Freq: Once | INTRAMUSCULAR | Status: DC

## 2011-12-21 MED ORDER — SODIUM CHLORIDE 0.45 % IV SOLN
INTRAVENOUS | Status: DC
  Administered 2011-12-21: 09:00:00 via INTRAVENOUS

## 2011-12-21 MED ORDER — SODIUM CHLORIDE 0.9 % IJ SOLN: 3.0000 mL | INTRAMUSCULAR | Status: DC | PRN

## 2011-12-21 MED ORDER — FENTANYL CITRATE 0.05 MG/ML IJ SOLN
INTRAMUSCULAR | Status: DC | PRN
  Administered 2011-12-21: 25 ug via INTRAVENOUS

## 2011-12-21 MED ORDER — BENZOCAINE 20 % MT SOLN
1.0000 "application " | OROMUCOSAL | Status: DC | PRN
  Filled 2011-12-21: qty 57

## 2011-12-21 MED ORDER — DIPHENHYDRAMINE HCL 50 MG/ML IJ SOLN
INTRAMUSCULAR | Status: AC
  Filled 2011-12-21: qty 1

## 2011-12-21 MED ORDER — MIDAZOLAM HCL 10 MG/2ML IJ SOLN
INTRAMUSCULAR | Status: AC
  Filled 2011-12-21: qty 2

## 2011-12-21 MED ORDER — SODIUM CHLORIDE 0.9 % IJ SOLN: 3.0000 mL | Freq: Two times a day (BID) | INTRAMUSCULAR | Status: DC

## 2011-12-21 MED ORDER — FENTANYL CITRATE 0.05 MG/ML IJ SOLN: 250.0000 ug | Freq: Once | INTRAMUSCULAR | Status: DC

## 2011-12-21 NOTE — Clinical Documentation Improvement (Signed)
BMI DOCUMENTATION CLARIFICATION QUERY  THIS DOCUMENT IS NOT A PERMANENT PART OF THE MEDICAL RECORD         12/21/11  Dear Jasmine December Marton Redwood  In an effort to better capture your patient's severity of illness, reflect appropriate length of stay and utilization of resources, a review of the patient medical record has revealed the following indicators.   Based on your clinical judgment, please clarify and document in a progress note and/or discharge summary the clinical condition associated with the following supporting information: In responding to this query please exercise your independent judgment.  The fact that a query is asked, does not imply that any particular answer is desired or expected.   According to the documented Height and Weight in CHL/EPIC, the patients BMI is greater than 40. If your clinical findings/judgment agrees with this, please document this along with the related diagnosis in the progress note and discharge summary. THANK YOU!    BEST PRACTICE: A diagnosis of UNDERWEIGHT or MORBID OBESITY should have the BMI documented along with it.  Possible Clinical Conditions?  - Morbid Obestiy  - Other condition (please document in the progress notes and/or discharge summary)  - Cannot Clinically determine at this time   Supporting Information:  Weight: 256 lbs Height 5'2" BMI= 47 (4/15=CHL; Doc Flowsheets)    Reviewed: additional documentation in the medical record made to today's note    Thank You,  Saul Fordyce  Clinical Documentation Specialist: [phone] Pager  Health Information Management [place]

## 2011-12-21 NOTE — Progress Notes (Signed)
PT Cancellation Note  Treatment cancelled today due to pt off of floor at TEE.  Will try another time.    Sunny Schlein, [place] [phone] 12/21/2011, 1:35 PM

## 2011-12-21 NOTE — Op Note (Signed)
See note in [employer] Derriona Branscom  

## 2011-12-21 NOTE — Procedures (Signed)
See note in [employer]; anteroseptal, apical and distal inferior akinesis; overall severely reduced LV function; EF 25-30; No apical thrombus; mild MR; trace AI and TR; positive saline microcavitation study. [name]

## 2011-12-21 NOTE — Progress Notes (Signed)
Occupational Therapy Treatment Patient Details Name: [name] MRN: [id number] DOB: 06-26-1961 Today's Date: 12/21/2011  OT Assessment/Plan OT Assessment/Plan Comments on Treatment Session: Pt is making excellent progress.  She demonstrates improved Lt. UE function and improved functional mobility OT Plan: Discharge plan remains appropriate OT Frequency: Min 2X/week Recommendations for Other Services: Rehab consult Follow Up Recommendations: Inpatient Rehab Equipment Recommended: Defer to next venue OT Goals ADL Goals ADL Goal: Grooming - Progress: Progressing toward goals ADL Goal: Upper Body Bathing - Progress: Progressing toward goals ADL Goal: Lower Body Bathing - Progress: Progressing toward goals ADL Goal: Toilet Transfer - Progress: Progressing toward goals Miscellaneous OT Goals OT Goal: Miscellaneous Goal #1 - Progress: Met  OT Treatment Precautions/Restrictions  Precautions Precautions: Fall Restrictions Weight Bearing Restrictions: No   ADL ADL Eating/Feeding: Minimal assistance;Performed (drink from cup with Lt. UE) Where Assessed - Eating/Feeding: Edge of bed Grooming: Performed;Wash/dry face;Minimal assistance (using Lt. UE) Where Assessed - Grooming: Sitting, bed;Unsupported Upper Body Bathing: Simulated;Moderate assistance Where Assessed - Upper Body Bathing: Unsupported;Sitting, bed (applied lotion to extremities) Lower Body Bathing: Simulated;Moderate assistance Lower Body Bathing Details (indicate cue type and reason): applied lotion to bil. LEs with Lt. UE Where Assessed - Lower Body Bathing: Sitting, bed;Unsupported Toilet Transfer: Simulated;Minimal assistance Toilet Transfer Details (indicate cue type and reason): sit to stand Toilet Transfer Method: Stand pivot Toilet Transfer Equipment: Bedside commode ADL Comments: Pt. performed active reaching with Lt. UE to ~75-80 degrees flexion and scaption with good aligment of shoulder and min  facilitation.  Demonstrates gross grasp and release Lt. hand Mobility  Bed Mobility Bed Mobility: Yes Supine to Sit: 4: Min assist;With rails;HOB flat Supine to Sit Details (indicate cue type and reason): facilitation at hips and shoulders Transfers Transfers: Yes Sit to Stand: 4: Min assist;With upper extremity assist;From bed Stand to Sit: 4: Min assist;With upper extremity assist;To bed Exercises    End of Session OT - End of Session Activity Tolerance: Patient tolerated treatment well Patient left: in bed;with call bell in reach General Behavior During Session: [facility] for tasks performed Cognition: [facility] for tasks performed  Kebin Maye M  12/21/2011, 5:47 PM

## 2011-12-21 NOTE — H&P (View-Only) (Signed)
Stroke Team Progress Note  HISTORY [name] is an 51 y.o. Female with multiple comorbidities including HIV, and coronary artery disease as well as hyperlipidemia who presented to the emergency room on 12/18/2011 with slurred speech, aphasia, left facial droop, and left hemiparesis. Diagnostic evaluation demonstrated acute infarct by MRI affecting primarily the  Right temporal region. Her NIH stroke scale score was 11 on presentation. She received IV tPA and was taken to IR but no intervention could be performed. She was admitted to the neuro ICU for further evaluation and treatment.  SUBJECTIVE No family is at the bedside.  Overall she feels her condition is unchanged. She has seen Dr.Tilley in the past. She does not want follow up with him.  OBJECTIVE Most recent Vital Signs: Filed Vitals:   12/20/11 0400 12/20/11 0500 12/20/11 0600 12/20/11 0700  BP: 124/91 127/78 121/89 131/74  Pulse: 79 74 73 75  Temp: 98.1 F (36.7 C)     TempSrc: Oral     Resp: 20 11 12 16  Height:      Weight:      SpO2: 100% 100% 100% 100%   CBG (last 3)  No results found for this basename: GLUCAP:3 in the last 72 hours Intake/Output from previous day: 04/14 0701 - 04/15 0700 In: 1800 [I.V.:1800] Out: 950 [Urine:950]  IV Fluid Intake:     . sodium chloride 75 mL/hr at 12/19/11 0800   MEDICATIONS    . antiseptic oral rinse  15 mL Mouth Rinse q12n4p  . aspirin EC  325 mg Oral Daily  . aspirin  300 mg Rectal Daily  . chlorhexidine  15 mL Mouth Rinse BID  . chlorhexidine      . nitroGLYCERIN 1.5mg/60ml(25 mcg/ml) - MC-IR  1.5 mg Intra-arterial to XRAY  . pantoprazole (PROTONIX) IV  40 mg Intravenous QHS  . white petrolatum       PRN:  acetaminophen, acetaminophen, fentaNYL, labetalol, midazolam, morphine injection, ondansetron (ZOFRAN) IV, senna-docusate  Diet:  NPO  Activity:  Bedrest DVT Prophylaxis:  SCDs   CLINICALLY SIGNIFICANT STUDIES CBC    Component Value Date/Time   WBC 4.5  12/19/2011 0510   RBC 4.65 12/19/2011 0510   HGB 11.9* 12/19/2011 0510   HCT 36.9 12/19/2011 0510   PLT 170 12/19/2011 0510   MCV 79.4 12/19/2011 0510   MCH 25.6* 12/19/2011 0510   MCHC 32.2 12/19/2011 0510   RDW 14.7 12/19/2011 0510   LYMPHSABS 2.4 12/18/2011 1320   MONOABS 0.6 12/18/2011 1320   EOSABS 0.1 12/18/2011 1320   BASOSABS 0.0 12/18/2011 1320   CMP    Component Value Date/Time   NA 142 12/19/2011 0510   K 3.7 12/19/2011 0510   CL 109 12/19/2011 0510   CO2 22 12/19/2011 0510   GLUCOSE 81 12/19/2011 0510   BUN 7 12/19/2011 0510   CREATININE 0.63 12/19/2011 0510   CREATININE 0.64 11/23/2011 1144   CALCIUM 9.2 12/19/2011 0510   PROT 8.4* 12/18/2011 1320   ALBUMIN 3.6 12/18/2011 1320   AST 47* 12/18/2011 1320   ALT 42* 12/18/2011 1320   ALKPHOS 46 12/18/2011 1320   BILITOT 0.7 12/18/2011 1320   GFRNONAA >90 12/19/2011 0510   GFRAA >90 12/19/2011 0510   COAGS Lab Results  Component Value Date   INR 0.98 12/18/2011   INR 0.98 12/22/2010   INR 1.00 01/24/2010   Lipid Panel    Component Value Date/Time   CHOL 189 12/19/2011 0510   TRIG 77 12/19/2011 0510     HDL 40 12/19/2011 0510   CHOLHDL 4.7 12/19/2011 0510   VLDL 15 12/19/2011 0510   LDLCALC 134* 12/19/2011 0510   HgbA1C  Lab Results  Component Value Date   HGBA1C 5.5 12/19/2011   Cardiac Panel (last 3 results)  Basename 12/18/11 1320  CKTOTAL 240*  CKMB 2.0  TROPONINI <0.30  RELINDX 0.8   Urinalysis    Component Value Date/Time   COLORURINE YELLOW 12/18/2011 1432   APPEARANCEUR CLOUDY* 12/18/2011 1432   LABSPEC 1.015 12/18/2011 1432   PHURINE 6.5 12/18/2011 1432   GLUCOSEU NEGATIVE 12/18/2011 1432   HGBUR NEGATIVE 12/18/2011 1432   BILIRUBINUR NEGATIVE 12/18/2011 1432   KETONESUR 15* 12/18/2011 1432   PROTEINUR 30* 12/18/2011 1432   UROBILINOGEN 0.2 12/18/2011 1432   NITRITE NEGATIVE 12/18/2011 1432   LEUKOCYTESUR NEGATIVE 12/18/2011 1432   Urine Drug Screen    Component Value Date/Time   LABOPIA POSITIVE* 05/24/2011 1800   LABOPIA  NEGATIVE 05/23/2009 0542   COCAINSCRNUR POSITIVE* 05/24/2011 1800   COCAINSCRNUR NEGATIVE 05/23/2009 0542   LABBENZ NONE DETECTED 05/24/2011 1800   LABBENZ NEGATIVE 05/23/2009 0542   AMPHETMU NONE DETECTED 05/24/2011 1800   AMPHETMU NEGATIVE 05/23/2009 0542   THCU POSITIVE* 05/24/2011 1800   LABBARB NONE DETECTED 05/24/2011 1800    Alcohol Level    Component Value Date/Time   ETH  Value: <11        LOWEST DETECTABLE LIMIT FOR SERUM ALCOHOL IS 5 mg/dL FOR MEDICAL PURPOSES ONLY* 01/20/2011 1144   CT of the brain   No evidence of acute intracranial abnormality.  Remote right cerebellar infarct.   Cerebral angio  S/P bilateral caritid and lt vert artery angiogram, Rt CFA approach, Distal rt MCA post perisylvian branch occlusion.  MRI of the brain   12/19/2011 1.  The study is markedly degraded by patient motion, exaggerating medium and small vessel disease. 2.  The focal occluded right MCA branch vessel is below the sensitivity of this exam 12/18/2011  1.  Acute non hemorrhagic infarct involving the posterior right insular cortex and posterior right frontal lobe. 2.  Minimal white matter disease may be within normal limits for age. 3.  Postoperative changes of the right globe. 4.  Remote infarct of the right cerebellum.   MRA of the brain   1.  The study is markedly degraded by patient motion, exaggerating medium and small vessel disease. 2.  The focal occluded right MCA branch vessel is below the sensitivity of this exam.   2D Echocardiogram  ordered   Carotid Doppler  No internal carotid artery stenosis bilaterally. Vertebrals with antegrade flow bilaterally.   CXR   1.  Low lung volumes. 2.  No acute cardiopulmonary disease.   EKG  normal sinus rhythm.   Physical Exam    Awake alert. Afebrile. Head is nontraumatic. Neck is supple without bruit. Hearing is normal. Cardiac exam no murmur or gallop. Lungs are clear to auscultation. Distal  but severe dysarthria. Moderate left lower face asymmetry.  Tongue midline.Pronounced left sided  Drift.Left hemiparesis with 3/5 strength on left. Mild diminished fine finger movements on left. Orbits right over left upper extremity. Mild left grip weak.. Diminished left hemibody sensation .Impaired coordination on left. Gait deferred. ASSESSMENT Ms. Yarisbel R Truett is a 50 y.o. 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 Status post IV t-PA 12/18/2011 at 1420.  On aspirin 81 mg orally every day prior to admission. Now on aspirin 300mg rectally every   day for secondary stroke prevention. Patient with resultant left facial droop, left hemiparesis, slurred speech and decreased sensation left side.  -opiates, cocaine, THC -hyperlipidemia -hypertension -HIV -CAD s/p stents Hospital day # 2  TREATMENT/PLAN -Continue aspirin 300 mg rectally every day for secondary stroke prevention. Change to po if able to swallow. -ST assess swallow. -transfer to the floor -TEE am -OOB. Therapy evals. Rehab consult. -social worker following for substance abuse  SHARON BIBY, AVNP, ANP-BC, GNP-BC [facility] Pager: [phone] 12/20/2011 7:53 AM  Dr. Laron Angelini, Stroke Center Medical Director, has personally reviewed chart, pertinent data, examined the patient and developed the plan of care.   

## 2011-12-21 NOTE — Progress Notes (Signed)
Stroke Team Progress Note  HISTORY [name] is an 51 y.o. Female with multiple comorbidities including HIV, and coronary artery disease as well as hyperlipidemia who presented to the emergency room on 12/18/2011 with slurred speech, aphasia, left facial droop, and left hemiparesis. Diagnostic evaluation demonstrated acute infarct by MRI affecting primarily the  Right temporal region. Her NIH stroke scale score was 11 on presentation. She received IV tPA and was taken to IR but no intervention could be performed. She was admitted to the neuro ICU for further evaluation and treatment.  SUBJECTIVE Patient in endo. Lives with boyfriend. He works 3rd shift. Daughter said she would help out. Cousin coming from [place].  OBJECTIVE Most recent Vital Signs: Filed Vitals:   12/21/11 1025 12/21/11 1040 12/21/11 1050 12/21/11 1100  BP: 144/91 150/100 147/96 139/84  Pulse: 83     Temp:      TempSrc:      Resp: 23 25 23 22   Height:      Weight:      SpO2: 100% 100% 100% 100%   CBG (last 3)  No results found for this basename: GLUCAP:3 in the last 72 hours Intake/Output from previous day: 04/15 0701 - 04/16 0700 In: 675 [I.V.:675] Out: 435 [Urine:435]  IV Fluid Intake:     . sodium chloride 20 mL/hr at 12/21/11 0927  . sodium chloride 75 mL/hr at 12/20/11 2317   MEDICATIONS    . aspirin EC  325 mg Oral Daily  . aspirin  300 mg Rectal Daily  . carvedilol  3.125 mg Oral BID WC  . darunavir  800 mg Oral Q breakfast  . emtricitabine-tenofovir  1 tablet Oral Daily  . fentaNYL  250 mcg Intravenous Once  . gabapentin  400 mg Oral TID  . isosorbide mononitrate  60 mg Oral Daily  . midazolam  10 mg Intravenous Once  . pantoprazole  40 mg Oral Daily  . ritonavir  100 mg Oral BID  . sodium chloride  3 mL Intravenous Q12H  . traZODone  50 mg Oral QHS  . valACYclovir  500 mg Oral BID  . DISCONTD: antiseptic oral rinse  15 mL Mouth Rinse q12n4p  . DISCONTD: chlorhexidine  15 mL Mouth Rinse BID    . DISCONTD: pantoprazole (PROTONIX) IV  40 mg Intravenous QHS   PRN:  sodium chloride, acetaminophen, acetaminophen, benzocaine, butamben-tetracaine-benzocaine, fentaNYL, labetalol, midazolam, morphine injection, senna-docusate, sodium chloride, DISCONTD: fentaNYL, DISCONTD: midazolam, DISCONTD: ondansetron (ZOFRAN) IV  Diet:  Cardiac  Activity:  OOB DVT Prophylaxis:  SCDs   CLINICALLY SIGNIFICANT STUDIES CBC    Component Value Date/Time   WBC 4.5 12/19/2011 0510   RBC 4.65 12/19/2011 0510   HGB 11.9* 12/19/2011 0510   HCT 36.9 12/19/2011 0510   PLT 170 12/19/2011 0510   MCV 79.4 12/19/2011 0510   MCH 25.6* 12/19/2011 0510   MCHC 32.2 12/19/2011 0510   RDW 14.7 12/19/2011 0510   LYMPHSABS 2.4 12/18/2011 1320   MONOABS 0.6 12/18/2011 1320   EOSABS 0.1 12/18/2011 1320   BASOSABS 0.0 12/18/2011 1320   CMP    Component Value Date/Time   NA 142 12/19/2011 0510   K 3.7 12/19/2011 0510   CL 109 12/19/2011 0510   CO2 22 12/19/2011 0510   GLUCOSE 81 12/19/2011 0510   BUN 7 12/19/2011 0510   CREATININE 0.63 12/19/2011 0510   CREATININE 0.64 11/23/2011 1144   CALCIUM 9.2 12/19/2011 0510   PROT 8.4* 12/18/2011 1320   ALBUMIN 3.6 12/18/2011  1320   AST 47* 12/18/2011 1320   ALT 42* 12/18/2011 1320   ALKPHOS 46 12/18/2011 1320   BILITOT 0.7 12/18/2011 1320   GFRNONAA >90 12/19/2011 0510   GFRAA >90 12/19/2011 0510   COAGS Lab Results  Component Value Date   INR 0.98 12/18/2011   INR 0.98 12/22/2010   INR 1.00 01/24/2010   Lipid Panel    Component Value Date/Time   CHOL 189 12/19/2011 0510   TRIG 77 12/19/2011 0510   HDL 40 12/19/2011 0510   CHOLHDL 4.7 12/19/2011 0510   VLDL 15 12/19/2011 0510   LDLCALC 134* 12/19/2011 0510   HgbA1C  Lab Results  Component Value Date   HGBA1C 5.5 12/19/2011   Cardiac Panel (last 3 results)   Basename 12/18/11 1320  CKTOTAL 240*  CKMB 2.0  TROPONINI <0.30  RELINDX 0.8   Urinalysis    Component Value Date/Time   COLORURINE YELLOW 12/18/2011 1432    APPEARANCEUR CLOUDY* 12/18/2011 1432   LABSPEC 1.015 12/18/2011 1432   PHURINE 6.5 12/18/2011 1432   GLUCOSEU NEGATIVE 12/18/2011 1432   HGBUR NEGATIVE 12/18/2011 1432   BILIRUBINUR NEGATIVE 12/18/2011 1432   KETONESUR 15* 12/18/2011 1432   PROTEINUR 30* 12/18/2011 1432   UROBILINOGEN 0.2 12/18/2011 1432   NITRITE NEGATIVE 12/18/2011 1432   LEUKOCYTESUR NEGATIVE 12/18/2011 1432   Urine Drug Screen    Component Value Date/Time   LABOPIA POSITIVE* 05/24/2011 1800   LABOPIA NEGATIVE 05/23/2009 0542   COCAINSCRNUR POSITIVE* 05/24/2011 1800   COCAINSCRNUR NEGATIVE 05/23/2009 0542   LABBENZ NONE DETECTED 05/24/2011 1800   LABBENZ NEGATIVE 05/23/2009 0542   AMPHETMU NONE DETECTED 05/24/2011 1800   AMPHETMU NEGATIVE 05/23/2009 0542   THCU POSITIVE* 05/24/2011 1800   LABBARB NONE DETECTED 05/24/2011 1800    Alcohol Level    Component Value Date/Time   ETH  Value: <11        LOWEST DETECTABLE LIMIT FOR SERUM ALCOHOL IS 5 mg/dL FOR MEDICAL PURPOSES ONLY* 01/20/2011 1144   CT of the brain   No evidence of acute intracranial abnormality.  Remote right cerebellar infarct.   Cerebral angio  S/P bilateral caritid and lt vert artery angiogram, Rt CFA approach, Distal rt MCA post perisylvian branch occlusion.  MRI of the brain   12/19/2011 1.  The study is markedly degraded by patient motion, exaggerating medium and small vessel disease. 2.  The focal occluded right MCA branch vessel is below the sensitivity of this exam 12/18/2011  1.  Acute non hemorrhagic infarct involving the posterior right insular cortex and posterior right frontal lobe. 2.  Minimal white matter disease may be within normal limits for age. 3.  Postoperative changes of the right globe. 4.  Remote infarct of the right cerebellum.   MRA of the brain   1.  The study is markedly degraded by patient motion, exaggerating medium and small vessel disease. 2.  The focal occluded right MCA branch vessel is below the sensitivity of this exam.   2D  Echocardiogram  ordered   Carotid Doppler  No internal carotid artery stenosis bilaterally. Vertebrals with antegrade flow bilaterally.   CXR   1.  Low lung volumes. 2.  No acute cardiopulmonary disease.   EKG  normal sinus rhythm.  TEE prelim results sow PFO. No clot.  Neurological Exam    Awake alert. Afebrile. Head is nontraumatic. Neck is supple without bruit. Hearing is normal. Cardiac exam no murmur or gallop. Lungs are clear to auscultation. Distal  but severe  dysarthria. Moderate left lower face asymmetry. Tongue midline.Pronounced left sided  Drift.Left hemiparesis with 3/5 strength on left. Mild diminished fine finger movements on left. Orbits right over left upper extremity. Mild left grip weak.. Diminished left hemibody sensation .Impaired coordination on left. Gait deferred.  ASSESSMENT [name] is a 51 y.o. 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 Status post IV t-PA 12/18/2011 at 1420.  On aspirin 81 mg orally every day prior to admission. Now on aspirin 325 mg orally every day for secondary stroke prevention. Patient with resultant left facial droop, left hemiparesis, slurred speech and decreased sensation left side.  -PFO (patent foramen ovale) likely an incidental finding. Will check LE venous dopplers for DVT as possible cause of stroke.  -opiates, cocaine, THC -hyperlipidemia -hypertension -HIV -CAD s/p stents  Hospital day # 3  TREATMENT/PLAN -Continue aspirin 325 mg orally every day for secondary stroke prevention.  -lower extremity venous dopplers to rule out DVT -Please schedule outpatient telemetry monitoring to assess patient for atrial fibrillation as source of stroke. May be arranged with patient's cardiologist, or cardiologist of choice.  -Child psychotherapist following for substance abuse -rehab when bed available - D/W patient PFO and stroke risk and possible participation in PFO closure  trial if interested  Annie Main, AVNP, ANP-BC, GNP-BC [facility] Pager: [phone] 12/21/2011 11:22 AM  Dr. Delia Heady, Stroke Center Medical Director, has personally reviewed chart, pertinent data, examined the patient and developed the plan of care.

## 2011-12-21 NOTE — Progress Notes (Signed)
OT Cancellation Note  Treatment cancelled today due to:  Pt off floor for TEE at 9AM this morning. OT to re-attempt at a later date/ time.  OT to continue to follow acutely.   [name]   OTR/L Pager: [phone] Office: [phone] .

## 2011-12-21 NOTE — Progress Notes (Signed)
*  PRELIMINARY RESULTS* Vascular Ultrasound Bilateral lower extremity venous duplex has been completed.   No obvious evidence of deep vein thrombosis bilaterally. All visible veins were compressible.  Malachy Moan, RDMS, RDCS 12/21/2011, 3:52 PM

## 2011-12-21 NOTE — Progress Notes (Signed)
  Echocardiogram Echocardiogram Transesophageal has been performed.  [name] [facility] 12/21/2011, 10:32 AM

## 2011-12-21 NOTE — Interval H&P Note (Signed)
History and Physical Interval Note:  12/21/2011 10:02 AM  [name]  has presented today for surgery, with the diagnosis of stroke  The various methods of treatment have been discussed with the patient and family. After consideration of risks, benefits and other options for treatment, the patient has consented to  Procedure(s) (LRB): TRANSESOPHAGEAL ECHOCARDIOGRAM (TEE) (N/A) as a surgical intervention .  The patients' history has been reviewed, patient examined, no change in status, stable for surgery.  I have reviewed the patients' chart and labs.  Questions were answered to the patient's satisfaction.     Olga Millers

## 2011-12-22 ENCOUNTER — Encounter (HOSPITAL_COMMUNITY): Payer: Self-pay | Admitting: Cardiology

## 2011-12-22 NOTE — Progress Notes (Signed)
Physical Therapy Note   12/22/11 1400  PT Visit Information  Last PT Received On 12/22/11  Precautions  Precautions Fall  Restrictions  Weight Bearing Restrictions No  Bed Mobility  Bed Mobility Yes  Supine to Sit 4: Min assist;With rails  Supine to Sit Details (indicate cue type and reason) Facilitation at hips  Sitting - Scoot to Edge of Bed 5: Supervision  Sitting - Scoot to Edge of Bed Details (indicate cue type and reason) cues for reciprocal scoot and attending to L side  Transfers  Transfers Yes  Sit to Stand 4: Min assist;With upper extremity assist;From bed  Sit to Stand Details (indicate cue type and reason) cues for use of UEs  Stand to Sit 4: Min assist;With upper extremity assist;With armrests;To chair/3-in-1  Stand to Sit Details cues to use UEs, control descent  Ambulation/Gait  Ambulation/Gait Yes  Ambulation/Gait Assistance 4: Min assist  Ambulation/Gait Assistance Details (indicate cue type and reason) cues for positioning in RW, upright posture  Ambulation Distance (Feet) 160 Feet  Assistive device Rolling walker  Gait Pattern Step-through pattern;Decreased stride length;Trunk flexed  Stairs No  Wheelchair Mobility  Wheelchair Mobility No  Modified Rankin (Stroke Patients Only)  Modified Rankin 4  PT - End of Session  Equipment Utilized During Treatment Gait belt  Activity Tolerance Patient tolerated treatment well  Patient left in chair;with call bell in reach  Nurse Communication Mobility status for transfers;Mobility status for ambulation  General  Behavior During Session [facility] for tasks performed  Cognition [facility] for tasks performed  PT - Assessment/Plan  Comments on Treatment Session pt presents with CVA.  pt much improved today and requiring decreased A.  pt will continue to require therapy for balance and to Max Independence.    PT Plan Discharge plan remains appropriate;Frequency remains appropriate  PT Frequency Min 4X/week  Recommendations for  Other Services Rehab consult  Follow Up Recommendations Inpatient Rehab  Equipment Recommended Defer to next venue  Acute Rehab PT Goals  PT Goal: Supine/Side to Sit - Progress Progressing toward goal  Pt will go Sit to Stand with modified independence;with upper extremity assist  PT Goal: Sit to Stand - Progress Goal set today  Pt will go Stand to Sit with modified independence;with upper extremity assist  PT Goal: Stand to Sit - Progress Goal set today  PT Transfer Goal: Bed to Chair/Chair to Bed - Progress Met  Pt will Ambulate >150 feet;with modified independence;with rolling walker  PT Goal: Ambulate - Progress Goal set today    Mack Hook, PT [phone]

## 2011-12-22 NOTE — Progress Notes (Signed)
Stroke Team Progress Note  HISTORY [name] is an 51 y.o. Female with multiple comorbidities including HIV, and coronary artery disease as well as hyperlipidemia who presented to the emergency room on 12/18/2011 with slurred speech, aphasia, left facial droop, and left hemiparesis. Diagnostic evaluation demonstrated acute infarct by MRI affecting primarily the  Right temporal region. Her NIH stroke scale score was 11 on presentation. She received IV tPA and was taken to IR but no intervention could be performed. She was admitted to the neuro ICU for further evaluation and treatment.  SUBJECTIVE Patient in endo. Lives with boyfriend. He works 3rd shift. Daughter said she would help out. Cousin coming from [place]. Stable no changes.  OBJECTIVE Most recent Vital Signs: Filed Vitals:   12/21/11 1915 12/21/11 2114 12/22/11 0159 12/22/11 0557  BP: 109/75 105/70 101/71 112/70  Pulse: 99 92 92 94  Temp: 98.2 F (36.8 C) 98.1 F (36.7 C) 97.8 F (36.6 C) 98.4 F (36.9 C)  TempSrc:  Oral Oral Oral  Resp: 18 20 18 18   Height:      Weight:      SpO2: 96% 92% 94% 97%   CBG (last 3)  No results found for this basename: GLUCAP:3 in the last 72 hours Intake/Output from previous day: 04/16 0701 - 04/17 0700 In: -  Out: 1 [Stool:1]  IV Fluid Intake:      . DISCONTD: sodium chloride 20 mL/hr at 12/21/11 0927  . DISCONTD: sodium chloride 75 mL/hr at 12/20/11 2317   MEDICATIONS     . aspirin EC  325 mg Oral Daily  . carvedilol  3.125 mg Oral BID WC  . darunavir  800 mg Oral Q breakfast  . emtricitabine-tenofovir  1 tablet Oral Daily  . gabapentin  400 mg Oral TID  . isosorbide mononitrate  60 mg Oral Daily  . pantoprazole  40 mg Oral Daily  . ritonavir  100 mg Oral BID  . traZODone  50 mg Oral QHS  . valACYclovir  500 mg Oral BID  . DISCONTD: aspirin  300 mg Rectal Daily  . DISCONTD: fentaNYL  250 mcg Intravenous Once  . DISCONTD: midazolam  10 mg Intravenous Once  . DISCONTD:  pantoprazole (PROTONIX) IV  40 mg Intravenous QHS  . DISCONTD: sodium chloride  3 mL Intravenous Q12H   PRN:  acetaminophen, acetaminophen, labetalol, morphine injection, senna-docusate, DISCONTD: sodium chloride, DISCONTD: benzocaine, DISCONTD: butamben-tetracaine-benzocaine, DISCONTD: fentaNYL, DISCONTD: midazolam, DISCONTD: sodium chloride  Diet:  Cardiac  Activity:  OOB DVT Prophylaxis:  SCDs   CLINICALLY SIGNIFICANT STUDIES CBC    Component Value Date/Time   WBC 4.5 12/19/2011 0510   RBC 4.65 12/19/2011 0510   HGB 11.9* 12/19/2011 0510   HCT 36.9 12/19/2011 0510   PLT 170 12/19/2011 0510   MCV 79.4 12/19/2011 0510   MCH 25.6* 12/19/2011 0510   MCHC 32.2 12/19/2011 0510   RDW 14.7 12/19/2011 0510   LYMPHSABS 2.4 12/18/2011 1320   MONOABS 0.6 12/18/2011 1320   EOSABS 0.1 12/18/2011 1320   BASOSABS 0.0 12/18/2011 1320   CMP    Component Value Date/Time   NA 142 12/19/2011 0510   K 3.7 12/19/2011 0510   CL 109 12/19/2011 0510   CO2 22 12/19/2011 0510   GLUCOSE 81 12/19/2011 0510   BUN 7 12/19/2011 0510   CREATININE 0.63 12/19/2011 0510   CREATININE 0.64 11/23/2011 1144   CALCIUM 9.2 12/19/2011 0510   PROT 8.4* 12/18/2011 1320   ALBUMIN 3.6 12/18/2011 1320  AST 47* 12/18/2011 1320   ALT 42* 12/18/2011 1320   ALKPHOS 46 12/18/2011 1320   BILITOT 0.7 12/18/2011 1320   GFRNONAA >90 12/19/2011 0510   GFRAA >90 12/19/2011 0510   COAGS Lab Results  Component Value Date   INR 0.98 12/18/2011   INR 0.98 12/22/2010   INR 1.00 01/24/2010   Lipid Panel    Component Value Date/Time   CHOL 189 12/19/2011 0510   TRIG 77 12/19/2011 0510   HDL 40 12/19/2011 0510   CHOLHDL 4.7 12/19/2011 0510   VLDL 15 12/19/2011 0510   LDLCALC 134* 12/19/2011 0510   HgbA1C  Lab Results  Component Value Date   HGBA1C 5.5 12/19/2011   Cardiac Panel (last 3 results)  No results found for this basename: CKTOTAL:3,CKMB:3,TROPONINI:3,RELINDX:3 in the last 72 hours Urinalysis    Component Value Date/Time   COLORURINE  YELLOW 12/18/2011 1432   APPEARANCEUR CLOUDY* 12/18/2011 1432   LABSPEC 1.015 12/18/2011 1432   PHURINE 6.5 12/18/2011 1432   GLUCOSEU NEGATIVE 12/18/2011 1432   HGBUR NEGATIVE 12/18/2011 1432   BILIRUBINUR NEGATIVE 12/18/2011 1432   KETONESUR 15* 12/18/2011 1432   PROTEINUR 30* 12/18/2011 1432   UROBILINOGEN 0.2 12/18/2011 1432   NITRITE NEGATIVE 12/18/2011 1432   LEUKOCYTESUR NEGATIVE 12/18/2011 1432   Urine Drug Screen    Component Value Date/Time   LABOPIA POSITIVE* 05/24/2011 1800   LABOPIA NEGATIVE 05/23/2009 0542   COCAINSCRNUR POSITIVE* 05/24/2011 1800   COCAINSCRNUR NEGATIVE 05/23/2009 0542   LABBENZ NONE DETECTED 05/24/2011 1800   LABBENZ NEGATIVE 05/23/2009 0542   AMPHETMU NONE DETECTED 05/24/2011 1800   AMPHETMU NEGATIVE 05/23/2009 0542   THCU POSITIVE* 05/24/2011 1800   LABBARB NONE DETECTED 05/24/2011 1800    Alcohol Level    Component Value Date/Time   ETH  Value: <11        LOWEST DETECTABLE LIMIT FOR SERUM ALCOHOL IS 5 mg/dL FOR MEDICAL PURPOSES ONLY* 01/20/2011 1144   CT of the brain   No evidence of acute intracranial abnormality.  Remote right cerebellar infarct.   Cerebral angio  S/P bilateral caritid and lt vert artery angiogram, Rt CFA approach, Distal rt MCA post perisylvian branch occlusion.  MRI of the brain   12/19/2011 1.  The study is markedly degraded by patient motion, exaggerating medium and small vessel disease. 2.  The focal occluded right MCA branch vessel is below the sensitivity of this exam 12/18/2011  1.  Acute non hemorrhagic infarct involving the posterior right insular cortex and posterior right frontal lobe. 2.  Minimal white matter disease may be within normal limits for age. 3.  Postoperative changes of the right globe. 4.  Remote infarct of the right cerebellum.   MRA of the brain   1.  The study is markedly degraded by patient motion, exaggerating medium and small vessel disease. 2.  The focal occluded right MCA branch vessel is below the sensitivity of  this exam.   2D Echocardiogram  EF 30-35% with hypokinesis of the inferior wall (base, mid, distal) and anteroseptal walls; akinesis of the distal anterior wall; akinesis of the distal inferoseptal, distal lateral and apical walls. No source of embolus.  Carotid Doppler  No internal carotid artery stenosis bilaterally. Vertebrals with antegrade flow bilaterally.   CXR   1.  Low lung volumes. 2.  No acute cardiopulmonary disease.   EKG  normal sinus rhythm.   TEE anteroseptal, apical and distal inferior akinesis; overall severely reduced LV function; EF 25-30; No apical thrombus; mild  MR; trace AI and TR; positive saline microcavitation study.  LE Venous Doppler No obvious evidence of deep vein thrombosis bilaterally. All visible veins were compressible.  Neurological Exam    Awake alert. Afebrile. Head is nontraumatic. Neck is supple without bruit. Hearing is normal. Cardiac exam no murmur or gallop. Lungs are clear to auscultation. Distal  but severe dysarthria. Moderate left lower face asymmetry. Tongue midline.Pronounced left sided  Drift.Left hemiparesis with 3/5 strength on left. Mild diminished fine finger movements on left. Orbits right over left upper extremity. Mild left grip weak.. Diminished left hemibody sensation .Impaired coordination on left. Gait deferred.  ASSESSMENT Ms. NATALEY BAHRI is a 51 y.o. 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 Status post IV t-PA 12/18/2011 at 1420.  On aspirin 81 mg orally every day prior to admission. Now on aspirin 325 mg orally every day for secondary stroke prevention. Patient with resultant left facial droop, left hemiparesis, slurred speech and decreased sensation left side. Needs rehab at discharge.  -PFO (patent foramen ovale) likely an incidental finding. Will check LE venous dopplers for DVT as possible cause of stroke.  -opiates, cocaine, THC. Adamantly denies recent use.  Agreeable to stop. -hyperlipidemia -hypertension -HIV -CAD s/p stents -morbid obesity Body mass index is 46.90 kg/(m^2).   Hospital day # 4  TREATMENT/PLAN -Continue aspirin 325 mg orally every day for secondary stroke prevention.  -Please schedule outpatient telemetry monitoring to assess patient for atrial fibrillation as source of stroke. May be arranged with patient's cardiologist, or cardiologist of choice.  - D/W patient PFO and stroke risk and possible participation in PFO closure trial if interested -rehab when bed available -d/c tele and IVF   SHARON BIBY, AVNP, ANP-BC, GNP-BC [facility] Pager: [phone] 12/22/2011 9:49 AM  Dr. Delia Heady, Stroke Center Medical Director, has personally reviewed chart, pertinent data, examined the patient and developed the plan of care.

## 2011-12-23 LAB — CARDIAC PANEL(CRET KIN+CKTOT+MB+TROPI): Total CK: 91 U/L (ref 7–177)

## 2011-12-23 MED ORDER — NITROGLYCERIN 0.4 MG SL SUBL
0.4000 mg | SUBLINGUAL_TABLET | SUBLINGUAL | Status: DC | PRN
  Administered 2011-12-23: 0.4 mg via SUBLINGUAL

## 2011-12-23 MED ORDER — NITROGLYCERIN 0.4 MG SL SUBL
SUBLINGUAL_TABLET | SUBLINGUAL | Status: AC
  Filled 2011-12-23: qty 25

## 2011-12-23 MED ORDER — ONDANSETRON HCL 4 MG/2ML IJ SOLN: 4.0000 mg | Freq: Four times a day (QID) | INTRAMUSCULAR | Status: DC | PRN

## 2011-12-23 MED ORDER — ONDANSETRON HCL 4 MG/2ML IJ SOLN
INTRAMUSCULAR | Status: AC
  Administered 2011-12-23: 4 mg
  Filled 2011-12-23: qty 2

## 2011-12-23 NOTE — Progress Notes (Signed)
  Pt refusing to call staff to assist with movement to/from bed and chair. RN asked pt to call staff before moving from chair to bed. Pt stated, "I will." Staff later witnessed pt moving to bed from chair without calling for assist. Pt  stated "I know how to turn the bed alarm off." Staff have frequently discussed  that these measures are here for the safety of the patient. Pt believes she is able to ambulate  safely alone despite significant weakness/decreased sensation. Pt even stated "I feel like my foot is dragging on the floor."  Will continue to make frequent rounds and will reinforce safe behavior.  Daphene Calamity RN

## 2011-12-23 NOTE — Progress Notes (Signed)
Stroke Team Progress Note  HISTORY [name] is an 51 y.o. Female with multiple comorbidities including HIV, and coronary artery disease as well as hyperlipidemia who presented to the emergency room on 12/18/2011 with slurred speech, aphasia, left facial droop, and left hemiparesis. Diagnostic evaluation demonstrated acute infarct by MRI affecting primarily the  Right temporal region. Her NIH stroke scale score was 11 on presentation. She received IV tPA and was taken to IR but no intervention could be performed. She was admitted to the neuro ICU for further evaluation and treatment.  SUBJECTIVE Family at bedside. They spoke with rehab case manager this am.  OBJECTIVE Most recent Vital Signs: Filed Vitals:   12/22/11 1801 12/22/11 2101 12/23/11 0139 12/23/11 0551  BP: 128/81 115/68 126/79 122/75  Pulse: 91 84 93 86  Temp: 97.9 F (36.6 C) 98.4 F (36.9 C) 99 F (37.2 C) 97.5 F (36.4 C)  TempSrc: Oral Oral Oral Oral  Resp: 18 20 18 18   Height:      Weight:      SpO2: 99% 95% 96% 92%   CBG (last 3)  No results found for this basename: GLUCAP:3 in the last 72 hours Intake/Output from previous day:   IV Fluid Intake:     MEDICATIONS    . aspirin EC  325 mg Oral Daily  . carvedilol  3.125 mg Oral BID WC  . darunavir  800 mg Oral Q breakfast  . emtricitabine-tenofovir  1 tablet Oral Daily  . gabapentin  400 mg Oral TID  . isosorbide mononitrate  60 mg Oral Daily  . pantoprazole  40 mg Oral Daily  . ritonavir  100 mg Oral BID  . traZODone  50 mg Oral QHS  . valACYclovir  500 mg Oral BID   PRN:  acetaminophen, acetaminophen, labetalol, morphine injection, senna-docusate  Diet:  Cardiac thin liquids Activity:  OOB DVT Prophylaxis:  SCDs   CLINICALLY SIGNIFICANT STUDIES CBC    Component Value Date/Time   WBC 4.5 12/19/2011 0510   RBC 4.65 12/19/2011 0510   HGB 11.9* 12/19/2011 0510   HCT 36.9 12/19/2011 0510   PLT 170 12/19/2011 0510   MCV 79.4 12/19/2011 0510   MCH  25.6* 12/19/2011 0510   MCHC 32.2 12/19/2011 0510   RDW 14.7 12/19/2011 0510   LYMPHSABS 2.4 12/18/2011 1320   MONOABS 0.6 12/18/2011 1320   EOSABS 0.1 12/18/2011 1320   BASOSABS 0.0 12/18/2011 1320   CMP    Component Value Date/Time   NA 142 12/19/2011 0510   K 3.7 12/19/2011 0510   CL 109 12/19/2011 0510   CO2 22 12/19/2011 0510   GLUCOSE 81 12/19/2011 0510   BUN 7 12/19/2011 0510   CREATININE 0.63 12/19/2011 0510   CREATININE 0.64 11/23/2011 1144   CALCIUM 9.2 12/19/2011 0510   PROT 8.4* 12/18/2011 1320   ALBUMIN 3.6 12/18/2011 1320   AST 47* 12/18/2011 1320   ALT 42* 12/18/2011 1320   ALKPHOS 46 12/18/2011 1320   BILITOT 0.7 12/18/2011 1320   GFRNONAA >90 12/19/2011 0510   GFRAA >90 12/19/2011 0510   COAGS Lab Results  Component Value Date   INR 0.98 12/18/2011   INR 0.98 12/22/2010   INR 1.00 01/24/2010   Lipid Panel    Component Value Date/Time   CHOL 189 12/19/2011 0510   TRIG 77 12/19/2011 0510   HDL 40 12/19/2011 0510   CHOLHDL 4.7 12/19/2011 0510   VLDL 15 12/19/2011 0510   LDLCALC 134* 12/19/2011 0510  HgbA1C  Lab Results  Component Value Date   HGBA1C 5.5 12/19/2011   Cardiac Panel (last 3 results)  No results found for this basename: CKTOTAL:3,CKMB:3,TROPONINI:3,RELINDX:3 in the last 72 hours Urinalysis    Component Value Date/Time   COLORURINE YELLOW 12/18/2011 1432   APPEARANCEUR CLOUDY* 12/18/2011 1432   LABSPEC 1.015 12/18/2011 1432   PHURINE 6.5 12/18/2011 1432   GLUCOSEU NEGATIVE 12/18/2011 1432   HGBUR NEGATIVE 12/18/2011 1432   BILIRUBINUR NEGATIVE 12/18/2011 1432   KETONESUR 15* 12/18/2011 1432   PROTEINUR 30* 12/18/2011 1432   UROBILINOGEN 0.2 12/18/2011 1432   NITRITE NEGATIVE 12/18/2011 1432   LEUKOCYTESUR NEGATIVE 12/18/2011 1432   Urine Drug Screen    Component Value Date/Time   LABOPIA POSITIVE* 05/24/2011 1800   LABOPIA NEGATIVE 05/23/2009 0542   COCAINSCRNUR POSITIVE* 05/24/2011 1800   COCAINSCRNUR NEGATIVE 05/23/2009 0542   LABBENZ NONE DETECTED 05/24/2011 1800    LABBENZ NEGATIVE 05/23/2009 0542   AMPHETMU NONE DETECTED 05/24/2011 1800   AMPHETMU NEGATIVE 05/23/2009 0542   THCU POSITIVE* 05/24/2011 1800   LABBARB NONE DETECTED 05/24/2011 1800    Alcohol Level    Component Value Date/Time   ETH  Value: <11        LOWEST DETECTABLE LIMIT FOR SERUM ALCOHOL IS 5 mg/dL FOR MEDICAL PURPOSES ONLY* 01/20/2011 1144   CT of the brain   No evidence of acute intracranial abnormality.  Remote right cerebellar infarct.   Cerebral angio  S/P bilateral caritid and lt vert artery angiogram, Rt CFA approach, Distal rt MCA post perisylvian branch occlusion.  MRI of the brain   12/19/2011 1.  The study is markedly degraded by patient motion, exaggerating medium and small vessel disease. 2.  The focal occluded right MCA branch vessel is below the sensitivity of this exam 12/18/2011  1.  Acute non hemorrhagic infarct involving the posterior right insular cortex and posterior right frontal lobe. 2.  Minimal white matter disease may be within normal limits for age. 3.  Postoperative changes of the right globe. 4.  Remote infarct of the right cerebellum.   MRA of the brain   1.  The study is markedly degraded by patient motion, exaggerating medium and small vessel disease. 2.  The focal occluded right MCA branch vessel is below the sensitivity of this exam.   2D Echocardiogram  EF 30-35% with hypokinesis of the inferior wall (base, mid, distal) and anteroseptal walls; akinesis of the distal anterior wall; akinesis of the distal inferoseptal, distal lateral and apical walls. No source of embolus.  Carotid Doppler  No internal carotid artery stenosis bilaterally. Vertebrals with antegrade flow bilaterally.   Bilateral lower extremity venous duplex No obvious evidence of deep vein thrombosis bilaterally. All visible veins were compressible.  CXR   1.  Low lung volumes. 2.  No acute cardiopulmonary disease.   EKG  normal sinus rhythm.   TEE anteroseptal, apical and distal  inferior akinesis; overall severely reduced LV function; EF 25-30; No apical thrombus; mild MR; trace AI and TR; positive saline microcavitation study.  LE Venous Doppler No obvious evidence of deep vein thrombosis bilaterally. All visible veins were compressible.  Neurological Exam    Awake alert. Afebrile. Head is nontraumatic. Neck is supple without bruit. Hearing is normal. Cardiac exam no murmur or gallop. Lungs are clear to auscultation. Distal  but severe dysarthria. Moderate left lower face asymmetry. Tongue midline.Pronounced left sided  Drift.Left hemiparesis with 3/5 strength on left. Mild diminished fine finger movements on left. Orbits  right over left upper extremity. Mild left grip weak.. Diminished left hemibody sensation. Impaired coordination on left. Gait deferred.  ASSESSMENT [name] is a 51 y.o. 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 Status post IV t-PA 12/18/2011 at 1420.  On aspirin 81 mg orally every day prior to admission. Now on aspirin 325 mg orally every day for secondary stroke prevention. No embolic source found in workup. Patient with resultant left facial droop, left hemiparesis, slurred speech and decreased sensation left side. Needs rehab at discharge.  -PFO (patent foramen ovale) likely an incidental finding. -opiates, cocaine, THC. Adamantly denies recent use. Agreeable to stop. -hyperlipidemia -hypertension -HIV -CAD s/p stents -morbid obesity Body mass index is 46.90 kg/(m^2).  Hospital day # 5  TREATMENT/PLAN -Continue aspirin 325 mg orally every day for secondary stroke prevention.  -outpatient telemetry monitoring to assess patient for atrial fibrillation as source of stroke. Will arrange after rehab stay at followup appt. -rehab when bed available  Joaquin Music, ANP-BC, GNP-BC [facility] Pager: [phone] 12/23/2011 9:53 AM  Dr. Delia Heady, Stroke  Center Medical Director, has personally reviewed chart, pertinent data, examined the patient and developed the plan of care.

## 2011-12-23 NOTE — Progress Notes (Signed)
12/23/2011 Dionicia Cerritos Elizabeth PTA [phone] pager [phone] office    

## 2011-12-23 NOTE — Progress Notes (Signed)
Speech Language Pathology Treatment  Patient Details Name: [name] MRN: [id number] DOB: 03/20/1961 Today's Date: 12/23/2011  SLP Assessment/Plan/Recommendation Clinical Impression Statement: Supervision level verbal cues to increase vocal intensity throughout a conversational level discussion regarding her progress and discharge disposition.  Patient receptive to cues, however repeated that she was excessively tired and can not seem to keep her eyes open.  Demonstrating improved speech intelligibility and overall communication abilites since last session.  Await SNF placement and no further skilled SLP treatment at this time as goals can be further met at SNF level of care. Plan: All goals met;Discharge SLP treatment   SLP Goals  SLP Goals SLP Goal #1 - Progress: Met SLP Goal #2 - Progress: Met   Myra Rude, M.S.,CCC-SLP Pager [phone] 12/23/2011, 3:02 PM

## 2011-12-23 NOTE — Progress Notes (Signed)
Rehab admissions - Evaluated for possible admission.  I spoke with patient, boyfriend and daughter.  Family plans to provide 24 supervision after potential rehab stay.  I have called and faxed information to [facility] for review.  I should get a response from insurance today about possible acute inpatient rehab admit.  If I get approval, we can plan to admit to rehab later today.  #[phone]

## 2011-12-23 NOTE — Progress Notes (Signed)
Physical Therapy Treatment Patient Details Name: [name] MRN: [id number] DOB: 25-Nov-1960 Today's Date: 12/23/2011  PT Assessment/Plan  PT - Assessment/Plan Comments on Treatment Session: Pt amb limited due to L knee pain. Pt required VC's and correct amb posture and saftey. Pt had improvement with bed mobility and tranfers. Pt and family was educated on the importance of safety when getting OOB without help      PT Plan: Discharge plan remains appropriate;Frequency remains appropriate PT Frequency: Min 4X/week Recommendations for Other Services: Rehab consult Follow Up Recommendations: Inpatient Rehab Equipment Recommended: Defer to next venue PT Goals  Acute Rehab PT Goals PT Goal: Supine/Side to Sit - Progress: Progressing toward goal PT Goal: Sit to Supine/Side - Progress: Progressing toward goal PT Goal: Sit to Stand - Progress: Progressing toward goal PT Goal: Stand to Sit - Progress: Progressing toward goal PT Transfer Goal: Bed to Chair/Chair to Bed - Progress: Progressing toward goal PT Goal: Ambulate - Progress: Progressing toward goal  PT Treatment Precautions/Restrictions  Precautions Precautions: Fall Restrictions Weight Bearing Restrictions: No Mobility (including Balance) Bed Mobility Supine to Sit: 5: Supervision Sitting - Scoot to Edge of Bed: 5: Supervision Transfers Transfers: Yes Sit to Stand: From chair/3-in-1;From toilet;With upper extremity assist;5: Supervision Stand to Sit: To chair/3-in-1;To bed;With upper extremity assist;With armrests;4: Min assist (Minguard A for safety ) Stand to Sit Details: Pt required VC's for hand placement on armrest during descent  Ambulation/Gait Ambulation/Gait: Yes Ambulation/Gait Assistance: 4: Min assist Ambulation/Gait Assistance Details (indicate cue type and reason): Pt needed VC's for postitioning and upright posture in RW. Pt was encouraged to use UE for support when pt complained of L knee pain during amb.    Ambulation Distance (Feet): 200 Feet Assistive device: Rolling walker Gait Pattern: Step-to pattern;Decreased stride length;Antalgic;Trunk flexed Stairs: No Wheelchair Mobility Wheelchair Mobility: No  End of Session PT - End of Session Equipment Utilized During Treatment: Gait belt Activity Tolerance: Patient limited by pain Patient left: with family/visitor present;in chair;with call bell in reach Nurse Communication: Mobility status for transfers;Mobility status for ambulation General Behavior During Session: [facility] for tasks performed Cognition: [facility] for tasks performed  Tamera Stands 12/23/2011, 12:08 PM

## 2011-12-23 NOTE — Progress Notes (Signed)
Rehab admissions - We have received a denial from insurance carrier for acute inpatient rehab.  Insurance will cover a SNF level for rehab.  Patient is agreeable to SNF.  I have called Annie Main, NP and Dede Query, SW to inform them of insurance denial.  Patient is also aware of denial.  Agree with need for SNF.  #[phone]

## 2011-12-24 MED ORDER — OXYCODONE HCL 5 MG PO TABS
5.0000 mg | ORAL_TABLET | ORAL | Status: DC | PRN
  Administered 2011-12-24 – 2011-12-28 (×9): 5 mg via ORAL
  Filled 2011-12-24 (×9): qty 1

## 2011-12-24 MED ORDER — TRAMADOL HCL 50 MG PO TABS: 50.0000 mg | ORAL_TABLET | Freq: Four times a day (QID) | ORAL | Status: DC | PRN

## 2011-12-24 MED ORDER — OXYCODONE-ACETAMINOPHEN 10-325 MG PO TABS: 1.0000 | ORAL_TABLET | ORAL | Status: DC | PRN

## 2011-12-24 MED ORDER — OXYCODONE-ACETAMINOPHEN 5-325 MG PO TABS
1.0000 | ORAL_TABLET | ORAL | Status: DC | PRN
  Administered 2011-12-24 – 2011-12-27 (×7): 1 via ORAL
  Filled 2011-12-24 (×7): qty 1

## 2011-12-24 NOTE — Progress Notes (Signed)
Utilization review completed. Daphanie Oquendo, RN, BSN. 12/24/11 

## 2011-12-24 NOTE — Progress Notes (Addendum)
Clinical Social Work Department CLINICAL SOCIAL WORK PLACEMENT NOTE 12/24/2011  Patient:  [name], [name]  Account Number:  [id number] Admit date:  12/18/2011  Clinical Social Worker:  Peggyann Shoals  Date/time:  12/23/2011 03:00 PM  Clinical Social Work is seeking post-discharge placement for this patient at the following level of care:   SKILLED NURSING   (*CSW will update this form in Epic as items are completed)   12/23/2011  Patient/family provided with [facility] Department of Clinical Social Work's list of facilities offering this level of care within the geographic area requested by the patient (or if unable, by the patient's family).  12/23/2011  Patient/family informed of their freedom to choose among providers that offer the needed level of care, that participate in Medicare, Medicaid or managed care program needed by the patient, have an available bed and are willing to accept the patient.  12/23/2011  Patient/family informed of [facility]' ownership interest in [facility], as well as of the fact that they are under no obligation to receive care at this facility.  PASARR submitted to EDS on 12/24/11 PASARR number received from EDS on 12/27/11  FL2 transmitted to all facilities in geographic area requested by pt/family on 12/24/11   FL2 transmitted to all facilities within larger geographic area on   Patient informed that his/her managed care company has contracts with or will negotiate with  certain facilities, including the following:     Patient/family informed of bed offers received:  12/26/11 Patient chooses bed at [facility] Physician recommends and patient chooses bed at  [facility]  Patient to be transferred to [place] on  12/29/11 Patient to be transferred to facility by 10/31/11  The following physician request were entered in Epic:   Additional Comments:  Conception Chancy, LCSWA [phone]

## 2011-12-24 NOTE — Progress Notes (Signed)
Stroke Team Progress Note  HISTORY [name] is an 51 y.o. Female with multiple comorbidities including HIV, and coronary artery disease as well as hyperlipidemia who presented to the emergency room on 12/18/2011 with slurred speech, aphasia, left facial droop, and left hemiparesis. Diagnostic evaluation demonstrated acute infarct by MRI affecting primarily the  Right temporal region. Her NIH stroke scale score was 11 on presentation. She received IV tPA and was taken to IR but no intervention could be performed. She was admitted to the neuro ICU for further evaluation and treatment.  SUBJECTIVE Pt understand need for SNF as insurance denied CIR transfer.  OBJECTIVE Most recent Vital Signs: Filed Vitals:   12/23/11 0900 12/23/11 1339 12/23/11 1836 12/23/11 2300  BP: 99/60 117/76 134/81 111/67  Pulse: 87 97 76 81  Temp: 96.9 F (36.1 C) 97.7 F (36.5 C)  98.3 F (36.8 C)  TempSrc: Oral Oral  Oral  Resp: 18 18 20 20   Height:      Weight:      SpO2: 97% 97% 99% 96%   CBG (last 3)  No results found for this basename: GLUCAP:3 in the last 72 hours Intake/Output from previous day: 04/18 0701 - 04/19 0700 In: 240 [P.O.:240] Out: 3 [Urine:2; Stool:1] IV Fluid Intake:     MEDICATIONS    . aspirin EC  325 mg Oral Daily  . carvedilol  3.125 mg Oral BID WC  . darunavir  800 mg Oral Q breakfast  . emtricitabine-tenofovir  1 tablet Oral Daily  . gabapentin  400 mg Oral TID  . isosorbide mononitrate  60 mg Oral Daily  . nitroGLYCERIN      . ondansetron      . pantoprazole  40 mg Oral Daily  . ritonavir  100 mg Oral BID  . traZODone  50 mg Oral QHS  . valACYclovir  500 mg Oral BID   PRN:  acetaminophen, nitroGLYCERIN, ondansetron, senna-docusate, DISCONTD: acetaminophen, DISCONTD: labetalol, DISCONTD:  morphine injection  Diet:  Cardiac thin liquids Activity:  OOB DVT Prophylaxis:  SCDs   CLINICALLY SIGNIFICANT STUDIES CBC    Component Value Date/Time   WBC 4.5 12/19/2011  0510   RBC 4.65 12/19/2011 0510   HGB 11.9* 12/19/2011 0510   HCT 36.9 12/19/2011 0510   PLT 170 12/19/2011 0510   MCV 79.4 12/19/2011 0510   MCH 25.6* 12/19/2011 0510   MCHC 32.2 12/19/2011 0510   RDW 14.7 12/19/2011 0510   LYMPHSABS 2.4 12/18/2011 1320   MONOABS 0.6 12/18/2011 1320   EOSABS 0.1 12/18/2011 1320   BASOSABS 0.0 12/18/2011 1320   CMP    Component Value Date/Time   NA 142 12/19/2011 0510   K 3.7 12/19/2011 0510   CL 109 12/19/2011 0510   CO2 22 12/19/2011 0510   GLUCOSE 81 12/19/2011 0510   BUN 7 12/19/2011 0510   CREATININE 0.63 12/19/2011 0510   CREATININE 0.64 11/23/2011 1144   CALCIUM 9.2 12/19/2011 0510   PROT 8.4* 12/18/2011 1320   ALBUMIN 3.6 12/18/2011 1320   AST 47* 12/18/2011 1320   ALT 42* 12/18/2011 1320   ALKPHOS 46 12/18/2011 1320   BILITOT 0.7 12/18/2011 1320   GFRNONAA >90 12/19/2011 0510   GFRAA >90 12/19/2011 0510   COAGS Lab Results  Component Value Date   INR 0.98 12/18/2011   INR 0.98 12/22/2010   INR 1.00 01/24/2010   Lipid Panel    Component Value Date/Time   CHOL 189 12/19/2011 0510   TRIG 77 12/19/2011  0510   HDL 40 12/19/2011 0510   CHOLHDL 4.7 12/19/2011 0510   VLDL 15 12/19/2011 0510   LDLCALC 134* 12/19/2011 0510   HgbA1C  Lab Results  Component Value Date   HGBA1C 5.5 12/19/2011   Cardiac Panel (last 3 results)   Basename 12/23/11 2042  CKTOTAL 91  CKMB 1.6  TROPONINI <0.30  RELINDX RELATIVE INDEX IS INVALID   Urinalysis    Component Value Date/Time   COLORURINE YELLOW 12/18/2011 1432   APPEARANCEUR CLOUDY* 12/18/2011 1432   LABSPEC 1.015 12/18/2011 1432   PHURINE 6.5 12/18/2011 1432   GLUCOSEU NEGATIVE 12/18/2011 1432   HGBUR NEGATIVE 12/18/2011 1432   BILIRUBINUR NEGATIVE 12/18/2011 1432   KETONESUR 15* 12/18/2011 1432   PROTEINUR 30* 12/18/2011 1432   UROBILINOGEN 0.2 12/18/2011 1432   NITRITE NEGATIVE 12/18/2011 1432   LEUKOCYTESUR NEGATIVE 12/18/2011 1432   Urine Drug Screen    Component Value Date/Time   LABOPIA POSITIVE* 05/24/2011  1800   LABOPIA NEGATIVE 05/23/2009 0542   COCAINSCRNUR POSITIVE* 05/24/2011 1800   COCAINSCRNUR NEGATIVE 05/23/2009 0542   LABBENZ NONE DETECTED 05/24/2011 1800   LABBENZ NEGATIVE 05/23/2009 0542   AMPHETMU NONE DETECTED 05/24/2011 1800   AMPHETMU NEGATIVE 05/23/2009 0542   THCU POSITIVE* 05/24/2011 1800   LABBARB NONE DETECTED 05/24/2011 1800    Alcohol Level    Component Value Date/Time   ETH  Value: <11        LOWEST DETECTABLE LIMIT FOR SERUM ALCOHOL IS 5 mg/dL FOR MEDICAL PURPOSES ONLY* 01/20/2011 1144   CT of the brain   No evidence of acute intracranial abnormality.  Remote right cerebellar infarct.   Cerebral angio  S/P bilateral caritid and lt vert artery angiogram, Rt CFA approach, Distal rt MCA post perisylvian branch occlusion.  MRI of the brain   12/19/2011 1.  The study is markedly degraded by patient motion, exaggerating medium and small vessel disease. 2.  The focal occluded right MCA branch vessel is below the sensitivity of this exam 12/18/2011  1.  Acute non hemorrhagic infarct involving the posterior right insular cortex and posterior right frontal lobe. 2.  Minimal white matter disease may be within normal limits for age. 3.  Postoperative changes of the right globe. 4.  Remote infarct of the right cerebellum.   MRA of the brain   1.  The study is markedly degraded by patient motion, exaggerating medium and small vessel disease. 2.  The focal occluded right MCA branch vessel is below the sensitivity of this exam.   2D Echocardiogram  EF 30-35% with hypokinesis of the inferior wall (base, mid, distal) and anteroseptal walls; akinesis of the distal anterior wall; akinesis of the distal inferoseptal, distal lateral and apical walls. No source of embolus.  Carotid Doppler  No internal carotid artery stenosis bilaterally. Vertebrals with antegrade flow bilaterally.   Bilateral lower extremity venous duplex No obvious evidence of deep vein thrombosis bilaterally. All visible veins  were compressible.  CXR   1.  Low lung volumes. 2.  No acute cardiopulmonary disease.   EKG  normal sinus rhythm.   TEE anteroseptal, apical and distal inferior akinesis; overall severely reduced LV function; EF 25-30; No apical thrombus; mild MR; trace AI and TR; positive saline microcavitation study.  LE Venous Doppler No obvious evidence of deep vein thrombosis bilaterally. All visible veins were compressible.  Neurological Exam    Awake alert. Afebrile. Head is nontraumatic. Neck is supple without bruit. Hearing is normal. Cardiac exam no murmur or  gallop. Lungs are clear to auscultation. Distal  but severe dysarthria. Moderate left lower face asymmetry. Tongue midline.Pronounced left sided  Drift.Left hemiparesis with 3/5 strength on left. Mild diminished fine finger movements on left. Orbits right over left upper extremity. Mild left grip weak.. Diminished left hemibody sensation. Impaired coordination on left. Gait deferred.  ASSESSMENT [name] is a 51 y.o. 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 Status post IV t-PA 12/18/2011 at 1420.  On aspirin 81 mg orally every day prior to admission. Now on aspirin 325 mg orally every day for secondary stroke prevention. No embolic source found in workup. Patient with resultant left facial droop, left hemiparesis, slurred speech and decreased sensation left side. Needs rehab at discharge. Medically ready for discharge.  -PFO (patent foramen ovale) likely an incidental finding. -opiates, cocaine, THC. Adamantly denies recent use. Agreeable to stop. -hyperlipidemia -hypertension -HIV -CAD s/p stents -morbid obesity Body mass index is 46.90 kg/(m^2).  Hospital day # 6  TREATMENT/PLAN -Continue aspirin 325 mg orally every day for secondary stroke prevention.  -outpatient telemetry monitoring to assess patient for atrial fibrillation as source of stroke. Will arrange after  rehab stay at followup appt. -SNF when bed available  SHARON BIBY, AVNP, ANP-BC, GNP-BC [facility] Pager: [phone] 12/24/2011 8:15 AM  Dr. Delia Heady, Stroke Center Medical Director, has personally reviewed chart, pertinent data, examined the patient and developed the plan of care.

## 2011-12-24 NOTE — Progress Notes (Signed)
Clinical Social Work Department BRIEF PSYCHOSOCIAL ASSESSMENT 12/24/2011  Patient:  [name], [name]     Account Number:  [id number]     Admit date:  12/18/2011  Clinical Social Worker:  Peggyann Shoals  Date/Time:  12/23/2011 03:00 PM  Referred by:  Physician  Date Referred:  12/23/2011 Referred for  SNF Placement   Other Referral:   Interview type:  Patient Other interview type:    PSYCHOSOCIAL DATA Living Status:  SIGNIFICANT OTHER Admitted from facility:   Level of care:   Primary support name:  Itza Maniaci Primary support relationship to patient:  CHILD, ADULT Degree of support available:   supportive.    CURRENT CONCERNS Current Concerns  Post-Acute Placement   Other Concerns:    SOCIAL WORK ASSESSMENT / PLAN CSW recieved consult from CIR as pt's insurance declined inpt rehab. CSW met with pt to address cosnult. Pt lives with her boyfriend and her adult children are supportive.   Assessment/plan status:  Other - See comment Other assessment/ plan:   CSW will initate SNF search and submit for [facility] prior auth for SNF placement. CSW will follow up with bed offers. CSW will continue to follow.   Information/referral to community resources:   As needed    PATIENT'S/FAMILY'S RESPONSE TO PLAN OF CARE: Pt was very pleasant and oriented. Pt is agreeabel to discharge plan to SNF.      Dede Query, MSW, [facility] [phone]

## 2011-12-24 NOTE — Progress Notes (Signed)
Physical Therapy Treatment Patient Details Name: [name] MRN: [id number] DOB: 1960-09-29 Today's Date: 12/24/2011 Time:  -     PT Assessment / Plan / Recommendation Clinical Impression  Pt presents with a medical diagnosis of CVA presenting with balance and strength deficits. Pt will benefit from skilled PT in the acute care setting in order to increase functional mobility and strengthening     Follow Up Recommendations  Inpatient Rehab    Equipment Recommendations  Defer to next venue    Frequency Min 4X/week    Precautions / Restrictions Precautions Precautions: Fall Restrictions Weight Bearing Restrictions: No       Mobility  Bed Mobility Bed Mobility: Rolling Right;Right Sidelying to Sit Bed Mobility DO NOT USE: Yes Rolling Right: 5: Supervision Right Sidelying to Sit: 5: Supervision Supine to Sit: 5: Supervision Sitting - Scoot to Edge of Bed: 5: Supervision Transfers Transfers: Sit to Stand;Stand to Sit Transfers DO NOT USE: Yes Sit to Stand: 5: Supervision;With upper extremity assist;From bed Stand to Sit: 5: Supervision;With upper extremity assist;To bed Ambulation/Gait Ambulation/Gait Assistance: 4: Min guard Ambulation Distance (Feet): 80 Feet Assistive device: Rolling walker Ambulation/Gait Assistance Details: Pt required VC's for postitioning and upright posture in RW. Pt c/o L knee pain with increased amb. Pt showed unsteadiness with gait due to L knee pain. pt enouraged to use UE to support LE during amb. Gait Pattern: Step-through pattern;Decreased stride length;Trunk flexed;Antalgic Stairs: No Wheelchair Mobility Wheelchair Mobility: No Modified Rankin (Stroke Patients Only) Modified Rankin: Moderately severe disability    Exercises     PT Goals Acute Rehab PT Goals PT Goal: Supine/Side to Sit - Progress: Progressing toward goal PT Goal: Sit to Supine/Side - Progress: Progressing toward goal PT Goal: Sit to Stand - Progress: Progressing  toward goal PT Goal: Stand to Sit - Progress: Progressing toward goal PT Goal: Ambulate - Progress: Progressing toward goal  Visit Information  Last PT Received On: 12/24/11      Subjective Data  Subjective: pt c/o of L knee pain    Cognition  Overall Cognitive Status: Appears within functional limits for tasks assessed/performed Arousal/Alertness: Awake/alert Behavior During Session: [facility] for tasks performed    Balance  Balance Balance Assessed: Yes Static Sitting Balance Static Sitting - Balance Support: Bilateral upper extremity supported;Feet supported Static Sitting - Level of Assistance: 4: Min assist Static Sitting - Comment/# of Minutes: Min assist for supoprt in sitting   End of Session PT - End of Session Equipment Utilized During Treatment: Gait belt Activity Tolerance: Patient limited by pain Patient left: in bed;with call bell/phone within reach;with bed alarm set Nurse Communication: Mobility status;Patient requests pain meds    Tamera Stands 12/24/2011, 9:34 AM 12/24/2011 Fredrich Birks PTA [phone] pager [phone] office

## 2011-12-25 MED ORDER — ALBUTEROL SULFATE HFA 108 (90 BASE) MCG/ACT IN AERS
1.0000 | INHALATION_SPRAY | Freq: Four times a day (QID) | RESPIRATORY_TRACT | Status: DC | PRN
  Administered 2011-12-26 – 2011-12-28 (×3): 1 via RESPIRATORY_TRACT
  Filled 2011-12-25 (×2): qty 6.7

## 2011-12-25 MED ORDER — ONDANSETRON HCL 4 MG/2ML IJ SOLN
4.0000 mg | Freq: Four times a day (QID) | INTRAMUSCULAR | Status: DC | PRN
  Administered 2011-12-25 – 2011-12-26 (×2): 4 mg via INTRAVENOUS
  Filled 2011-12-25 (×2): qty 2

## 2011-12-25 NOTE — Progress Notes (Signed)
Stroke Team Progress Note  HISTORY [name] is a 51 AA Female with multiple comorbidities including HIV, and coronary artery disease as well as hyperlipidemia who presented to the emergency room on 12/18/2011 with slurred speech, aphasia, left facial droop, and left hemiparesis. Diagnostic evaluation demonstrated acute infarct by MRI affecting right temporal region. Her NIH stroke scale score was 11 on presentation. She received IV tPA and was taken to IR but no intervention could be performed.    SUBJECTIVE Pt understand need for SNF as insurance denied CIR transfer. She has SOB with exertion, previously responsive to albuterol inhaler.  OBJECTIVE Most recent Vital Signs: Filed Vitals:   12/24/11 0800 12/24/11 1200 12/24/11 2300 12/25/11 0700  BP: 132/91 122/84 104/69 121/86  Pulse: 85 78 81 76  Temp: 98.1 F (36.7 C) 97.9 F (36.6 C) 97.9 F (36.6 C) 97.6 F (36.4 C)  TempSrc: Oral Oral Oral Oral  Resp: 17 18 20 20   Height:      Weight:      SpO2: 96% 96% 94% 97%   CBG (last 3)  No results found for this basename: GLUCAP:3 in the last 72 hours Intake/Output from previous day:   IV Fluid Intake:     MEDICATIONS     . aspirin EC  325 mg Oral Daily  . carvedilol  3.125 mg Oral BID WC  . darunavir  800 mg Oral Q breakfast  . emtricitabine-tenofovir  1 tablet Oral Daily  . gabapentin  400 mg Oral TID  . isosorbide mononitrate  60 mg Oral Daily  . pantoprazole  40 mg Oral Daily  . ritonavir  100 mg Oral BID  . traZODone  50 mg Oral QHS  . valACYclovir  500 mg Oral BID   PRN:  acetaminophen, nitroGLYCERIN, oxyCODONE, oxyCODONE-acetaminophen, senna-docusate, traMADol, DISCONTD: oxyCODONE-acetaminophen  Diet:  Cardiac thin liquids Activity:  OOB DVT Prophylaxis:  SCDs   CLINICALLY SIGNIFICANT STUDIES CBC    Component Value Date/Time   WBC 4.5 12/19/2011 0510   RBC 4.65 12/19/2011 0510   HGB 11.9* 12/19/2011 0510   HCT 36.9 12/19/2011 0510   PLT 170 12/19/2011 0510     MCV 79.4 12/19/2011 0510   MCH 25.6* 12/19/2011 0510   MCHC 32.2 12/19/2011 0510   RDW 14.7 12/19/2011 0510   LYMPHSABS 2.4 12/18/2011 1320   MONOABS 0.6 12/18/2011 1320   EOSABS 0.1 12/18/2011 1320   BASOSABS 0.0 12/18/2011 1320   CMP    Component Value Date/Time   NA 142 12/19/2011 0510   K 3.7 12/19/2011 0510   CL 109 12/19/2011 0510   CO2 22 12/19/2011 0510   GLUCOSE 81 12/19/2011 0510   BUN 7 12/19/2011 0510   CREATININE 0.63 12/19/2011 0510   CREATININE 0.64 11/23/2011 1144   CALCIUM 9.2 12/19/2011 0510   PROT 8.4* 12/18/2011 1320   ALBUMIN 3.6 12/18/2011 1320   AST 47* 12/18/2011 1320   ALT 42* 12/18/2011 1320   ALKPHOS 46 12/18/2011 1320   BILITOT 0.7 12/18/2011 1320   GFRNONAA >90 12/19/2011 0510   GFRAA >90 12/19/2011 0510   COAGS Lab Results  Component Value Date   INR 0.98 12/18/2011   INR 0.98 12/22/2010   INR 1.00 01/24/2010   Lipid Panel    Component Value Date/Time   CHOL 189 12/19/2011 0510   TRIG 77 12/19/2011 0510   HDL 40 12/19/2011 0510   CHOLHDL 4.7 12/19/2011 0510   VLDL 15 12/19/2011 0510   LDLCALC 134* 12/19/2011 0510  HgbA1C  Lab Results  Component Value Date   HGBA1C 5.5 12/19/2011   Cardiac Panel (last 3 results)   Basename 12/23/11 2042  CKTOTAL 91  CKMB 1.6  TROPONINI <0.30  RELINDX RELATIVE INDEX IS INVALID   Urinalysis    Component Value Date/Time   COLORURINE YELLOW 12/18/2011 1432   APPEARANCEUR CLOUDY* 12/18/2011 1432   LABSPEC 1.015 12/18/2011 1432   PHURINE 6.5 12/18/2011 1432   GLUCOSEU NEGATIVE 12/18/2011 1432   HGBUR NEGATIVE 12/18/2011 1432   BILIRUBINUR NEGATIVE 12/18/2011 1432   KETONESUR 15* 12/18/2011 1432   PROTEINUR 30* 12/18/2011 1432   UROBILINOGEN 0.2 12/18/2011 1432   NITRITE NEGATIVE 12/18/2011 1432   LEUKOCYTESUR NEGATIVE 12/18/2011 1432   Urine Drug Screen    Component Value Date/Time   LABOPIA POSITIVE* 05/24/2011 1800   LABOPIA NEGATIVE 05/23/2009 0542   COCAINSCRNUR POSITIVE* 05/24/2011 1800   COCAINSCRNUR NEGATIVE 05/23/2009  0542   LABBENZ NONE DETECTED 05/24/2011 1800   LABBENZ NEGATIVE 05/23/2009 0542   AMPHETMU NONE DETECTED 05/24/2011 1800   AMPHETMU NEGATIVE 05/23/2009 0542   THCU POSITIVE* 05/24/2011 1800   LABBARB NONE DETECTED 05/24/2011 1800    Alcohol Level    Component Value Date/Time   ETH  Value: <11        LOWEST DETECTABLE LIMIT FOR SERUM ALCOHOL IS 5 mg/dL FOR MEDICAL PURPOSES ONLY* 01/20/2011 1144   CT of the brain   No evidence of acute intracranial abnormality.  Remote right cerebellar infarct.   Cerebral angio  S/P bilateral caritid and lt vert artery angiogram, Rt CFA approach, Distal rt MCA post perisylvian branch occlusion.  MRI of the brain   12/19/2011 1.  The study is markedly degraded by patient motion, exaggerating medium and small vessel disease. 2.  The focal occluded right MCA branch vessel is below the sensitivity of this exam 12/18/2011  1.  Acute non hemorrhagic infarct involving the posterior right insular cortex and posterior right frontal lobe. 2.  Minimal white matter disease may be within normal limits for age. 3.  Postoperative changes of the right globe. 4.  Remote infarct of the right cerebellum.   MRA of the brain   1.  The study is markedly degraded by patient motion, exaggerating medium and small vessel disease. 2.  The focal occluded right MCA branch vessel is below the sensitivity of this exam.   2D Echocardiogram  EF 30-35% with hypokinesis of the inferior wall (base, mid, distal) and anteroseptal walls; akinesis of the distal anterior wall; akinesis of the distal inferoseptal, distal lateral and apical walls. No source of embolus.  Carotid Doppler  No internal carotid artery stenosis bilaterally. Vertebrals with antegrade flow bilaterally.   Bilateral lower extremity venous duplex No obvious evidence of deep vein thrombosis bilaterally. All visible veins were compressible.  CXR   1.  Low lung volumes. 2.  No acute cardiopulmonary disease.   EKG  normal sinus rhythm.     TEE anteroseptal, apical and distal inferior akinesis; overall severely reduced LV function; EF 25-30%; No apical thrombus; mild MR; trace AI and TR; positive saline microcavitation study.  LE Venous Doppler No obvious evidence of deep vein thrombosis bilaterally. All visible veins were compressible.  Neurological Exam    Awake alert. Afebrile. Head is nontraumatic. Neck is supple without bruit. Hearing is normal. Cardiac exam no murmur or gallop. Lungs are clear to auscultation.  SOB with exertion and prolonged talking. Moderate dysarthria. Moderate left lower face asymmetry. Tongue midline.Pronounced left sided  Drift.Left  hemiparesis with 3+/5 strength on left.Orbits right over left upper extremity. Mild left grip weak.. Diminished left hemibody sensation. No dysmetria. Gait: steady  ASSESSMENT [name] is a 50 y.o. 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 Status post IV t-PA 12/18/2011 at 1420.  On aspirin 81 mg orally every day prior to admission. Now on aspirin 325 mg orally every day for secondary stroke prevention. No embolic source found in workup. Patient with resultant left facial droop, left hemiparesis, slurred speech and decreased sensation left side. Needs rehab at discharge. Medically ready for discharge.  -PFO (patent foramen ovale) likely an incidental finding. -opiates, cocaine, THC. Adamantly denies recent use. Agreeable to stop. -hyperlipidemia -hypertension -HIV -CAD s/p stents -morbid obesity Body mass index is 46.90 kg/(m^2).  Hospital day # 8  TREATMENT/PLAN -Continue aspirin 325 mg orally every day for secondary stroke prevention.  -outpatient telemetry monitoring to assess patient for atrial fibrillation as source of stroke. Will arrange after rehab stay at followup appt. -SNF when bed available -Albuterol inhaler.

## 2011-12-26 NOTE — Progress Notes (Signed)
CSW provided pt with bed offers: [facility] and [facility]. Pt reports her dtr will tour offers today. Weekday CSW to f/u on choice.  Dellie Burns, MSW, [place] [phone] (weekend)

## 2011-12-26 NOTE — Progress Notes (Signed)
Stroke Team Progress Note  HISTORY [name] is a 51 AA Female with multiple comorbidities including HIV, and coronary artery disease as well as hyperlipidemia who presented to the emergency room on 12/18/2011 with slurred speech, aphasia, left facial droop, and left hemiparesis. Diagnostic evaluation demonstrated acute infarct by MRI affecting right temporal region. Her NIH stroke scale score was 11 on presentation. She received IV tPA and was taken to IR but no intervention could be performed.    SUBJECTIVE C/o HA this am.  Primarily occipital.  No visual disturbance.  Able to raise left arm over head.  OBJECTIVE Most recent Vital Signs: Filed Vitals:   12/24/11 2300 12/25/11 0700 12/25/11 1322 12/26/11 0632  BP: 104/69 121/86 129/88 121/82  Pulse: 81 76 96 74  Temp: 97.9 F (36.6 C) 97.6 F (36.4 C) 98.1 F (36.7 C) 97.9 F (36.6 C)  TempSrc: Oral Oral Oral Oral  Resp: 20 20 22 20   Height:      Weight:      SpO2: 94% 97% 99% 96%      MEDICATIONS     . aspirin EC  325 mg Oral Daily  . carvedilol  3.125 mg Oral BID WC  . darunavir  800 mg Oral Q breakfast  . emtricitabine-tenofovir  1 tablet Oral Daily  . gabapentin  400 mg Oral TID  . isosorbide mononitrate  60 mg Oral Daily  . pantoprazole  40 mg Oral Daily  . ritonavir  100 mg Oral BID  . traZODone  50 mg Oral QHS  . valACYclovir  500 mg Oral BID   PRN:  acetaminophen, albuterol, nitroGLYCERIN, ondansetron, oxyCODONE, oxyCODONE-acetaminophen, senna-docusate, traMADol  Diet:  Cardiac thin liquids Activity:  OOB DVT Prophylaxis:  SCDs   CLINICALLY SIGNIFICANT STUDIES CBC    Component Value Date/Time   WBC 4.5 12/19/2011 0510   RBC 4.65 12/19/2011 0510   HGB 11.9* 12/19/2011 0510   HCT 36.9 12/19/2011 0510   PLT 170 12/19/2011 0510   MCV 79.4 12/19/2011 0510   MCH 25.6* 12/19/2011 0510   MCHC 32.2 12/19/2011 0510   RDW 14.7 12/19/2011 0510   LYMPHSABS 2.4 12/18/2011 1320   MONOABS 0.6 12/18/2011 1320   EOSABS  0.1 12/18/2011 1320   BASOSABS 0.0 12/18/2011 1320   CMP    Component Value Date/Time   NA 142 12/19/2011 0510   K 3.7 12/19/2011 0510   CL 109 12/19/2011 0510   CO2 22 12/19/2011 0510   GLUCOSE 81 12/19/2011 0510   BUN 7 12/19/2011 0510   CREATININE 0.63 12/19/2011 0510   CREATININE 0.64 11/23/2011 1144   CALCIUM 9.2 12/19/2011 0510   PROT 8.4* 12/18/2011 1320   ALBUMIN 3.6 12/18/2011 1320   AST 47* 12/18/2011 1320   ALT 42* 12/18/2011 1320   ALKPHOS 46 12/18/2011 1320   BILITOT 0.7 12/18/2011 1320   GFRNONAA >90 12/19/2011 0510   GFRAA >90 12/19/2011 0510   COAGS Lab Results  Component Value Date   INR 0.98 12/18/2011   INR 0.98 12/22/2010   INR 1.00 01/24/2010   Lipid Panel    Component Value Date/Time   CHOL 189 12/19/2011 0510   TRIG 77 12/19/2011 0510   HDL 40 12/19/2011 0510   CHOLHDL 4.7 12/19/2011 0510   VLDL 15 12/19/2011 0510   LDLCALC 134* 12/19/2011 0510   HgbA1C  Lab Results  Component Value Date   HGBA1C 5.5 12/19/2011   Cardiac Panel (last 3 results)   Basename 12/23/11 2042  CKTOTAL  91  CKMB 1.6  TROPONINI <0.30  RELINDX RELATIVE INDEX IS INVALID   Urinalysis    Component Value Date/Time   COLORURINE YELLOW 12/18/2011 1432   APPEARANCEUR CLOUDY* 12/18/2011 1432   LABSPEC 1.015 12/18/2011 1432   PHURINE 6.5 12/18/2011 1432   GLUCOSEU NEGATIVE 12/18/2011 1432   HGBUR NEGATIVE 12/18/2011 1432   BILIRUBINUR NEGATIVE 12/18/2011 1432   KETONESUR 15* 12/18/2011 1432   PROTEINUR 30* 12/18/2011 1432   UROBILINOGEN 0.2 12/18/2011 1432   NITRITE NEGATIVE 12/18/2011 1432   LEUKOCYTESUR NEGATIVE 12/18/2011 1432   Urine Drug Screen    Component Value Date/Time   LABOPIA POSITIVE* 05/24/2011 1800   LABOPIA NEGATIVE 05/23/2009 0542   COCAINSCRNUR POSITIVE* 05/24/2011 1800   COCAINSCRNUR NEGATIVE 05/23/2009 0542   LABBENZ NONE DETECTED 05/24/2011 1800   LABBENZ NEGATIVE 05/23/2009 0542   AMPHETMU NONE DETECTED 05/24/2011 1800   AMPHETMU NEGATIVE 05/23/2009 0542   THCU POSITIVE*  05/24/2011 1800   LABBARB NONE DETECTED 05/24/2011 1800    Alcohol Level    Component Value Date/Time   ETH  Value: <11        LOWEST DETECTABLE LIMIT FOR SERUM ALCOHOL IS 5 mg/dL FOR MEDICAL PURPOSES ONLY* 01/20/2011 1144   CT of the brain   No evidence of acute intracranial abnormality.  Remote right cerebellar infarct.   Cerebral angio  S/P bilateral caritid and lt vert artery angiogram, Rt CFA approach, Distal rt MCA post perisylvian branch occlusion.  MRI of the brain   12/19/2011 1.  The study is markedly degraded by patient motion, exaggerating medium and small vessel disease. 2.  The focal occluded right MCA branch vessel is below the sensitivity of this exam 12/18/2011  1.  Acute non hemorrhagic infarct involving the posterior right insular cortex and posterior right frontal lobe. 2.  Minimal white matter disease may be within normal limits for age. 3.  Postoperative changes of the right globe. 4.  Remote infarct of the right cerebellum.   MRA of the brain   1.  The study is markedly degraded by patient motion, exaggerating medium and small vessel disease. 2.  The focal occluded right MCA branch vessel is below the sensitivity of this exam.   2D Echocardiogram  EF 30-35% with hypokinesis of the inferior wall (base, mid, distal) and anteroseptal walls; akinesis of the distal anterior wall; akinesis of the distal inferoseptal, distal lateral and apical walls. No source of embolus.  Carotid Doppler  No internal carotid artery stenosis bilaterally. Vertebrals with antegrade flow bilaterally.   Bilateral lower extremity venous duplex No obvious evidence of deep vein thrombosis bilaterally. All visible veins were compressible.  CXR   1.  Low lung volumes. 2.  No acute cardiopulmonary disease.   EKG  normal sinus rhythm.   TEE anteroseptal, apical and distal inferior akinesis; overall severely reduced LV function; EF 25-30%; No apical thrombus; mild MR; trace AI and TR; positive saline  microcavitation study.  LE Venous Doppler No obvious evidence of deep vein thrombosis bilaterally. All visible veins were compressible.  Neurological Exam    Awake alert.. Moderate dysarthria. Moderate left lower face asymmetry. Tongue midline.Pronounced left sided Drift.Left hemiparesis with 4+/5 strength on left. Orbits right over left upper extremity. Mild left grip weak. Diminished left hemibody sensation. No dysmetria.   ASSESSMENT [name] is a 51 y.o. 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 Status post IV t-PA 12/18/2011 at 1420.  On aspirin 81  mg orally every day prior to admission. Now on aspirin 325 mg orally every day for secondary stroke prevention. No embolic source found in workup. Patient with resultant left facial droop, left hemiparesis, slurred speech and decreased sensation left side. Needs rehab at discharge. Medically ready for discharge.  -PFO (patent foramen ovale) likely an incidental finding. -opiates, cocaine, THC. Adamantly denies recent use. Agreeable to stop. -hyperlipidemia- LDL 134, not at goal <100 -hypertension -HIV -CAD s/p stents -morbid obesity Body mass index is 46.90 kg/(m^2).  Hospital day # 8  TREATMENT/PLAN -Continue aspirin 325 mg orally every day for secondary stroke prevention.  -outpatient telemetry monitoring to assess patient for atrial fibrillation as source of stroke. Will arrange after rehab stay at followup appt. -SNF when bed available -Albuterol inhaler. -risk factor modification- allergy to lipitor- consider alternative treatments for chol management. -rec polysubstance abuse counseling -nutrition counseling  Marya Fossa PA-C [facility] [phone] 12/26/11 [phone]

## 2011-12-26 NOTE — Progress Notes (Signed)
Reviewed, PT/OT, social work consult for discharge plan

## 2011-12-27 MED ORDER — NON FORMULARY: 5.0000 mg | Freq: Every day | Status: DC

## 2011-12-27 MED ORDER — ROSUVASTATIN CALCIUM 5 MG PO TABS
5.0000 mg | ORAL_TABLET | Freq: Every day | ORAL | Status: DC
  Administered 2011-12-27 – 2011-12-28 (×2): 5 mg via ORAL
  Filled 2011-12-27 (×3): qty 1

## 2011-12-27 NOTE — Progress Notes (Signed)
CSW met with pt to provide choice of facilities. Pt chose [facility]. CSW will facilitate discharge to [facility] on 12/28/11.   Dede Query, MSW, Theresia Majors [phone]

## 2011-12-27 NOTE — Progress Notes (Signed)
Agree with updated d/c plans by PTA.  [place], [place] DPT [phone]

## 2011-12-27 NOTE — Plan of Care (Signed)
Problem: Food- and Nutrition-Related Knowledge Deficit (NB-1.1) Goal: Nutrition education Formal process to instruct or train a patient/client in a skill or to impart knowledge to help patients/clients voluntarily manage or modify food choices and eating behavior to maintain or improve health.  Outcome: Completed/Met Date Met:  12/27/11 12/27/11: Consult received for diet education. Pt admitted for stroke. Pt is 62 inches, 256 lbs with BMI of 46.9 (extreme obesity/obesity class III). LDL: 134. Discharge plan is for SNF for rehab. Educated pt on low cholesterol diet. Pt tearful, upset with her current situation. Pt had recently tried to eat healthier this month and then had a stroke 2 weeks later. Tried to focus on positive changes that she could make to improve her health to prevent further health problems. Pt with no questions at this time as she is still processing her situation. Handouts with contact information provided for future questions. Education to continue at [facility]. Heart Healthy diet provided.  Kendell Bane Cornelison [phone]

## 2011-12-27 NOTE — Progress Notes (Signed)
Physical Therapy Treatment Patient Details Name: [name] MRN: [id number] DOB: Oct 20, 1960 Today's Date: 12/27/2011 Time: 4098-1191 PT Time Calculation (min): 17 min  PT Assessment / Plan / Recommendation Comments on Treatment Session  Pt states that L knee pain had improved and she was able to tolerate increased ambulation well. Still requiring cues for impulsiveness and dcreased safety awareness    Follow Up Recommendations  Skilled nursing facility;Other (comment) (Insurance denied CIR. No assistance at home)    Equipment Recommendations  Defer to next venue    Frequency Min 4X/week   Plan Discharge plan remains appropriate    Precautions / Restrictions Precautions Precautions: Fall       Mobility  Bed Mobility Bed Mobility: Not assessed Transfers Sit to Stand: 6: Modified independent (Device/Increase time) Stand to Sit: 6: Modified independent (Device/Increase time) Ambulation/Gait Ambulation/Gait Assistance: 4: Min guard Ambulation Distance (Feet): 230 Feet Assistive device: Rolling walker Ambulation/Gait Assistance Details: Cues for safe use of RW. Pt tend to run into wall on R side. Cues to keep both hands on RW when in use.  Gait Pattern: Decreased stride length    Exercises     PT Goals Acute Rehab PT Goals PT Goal: Sit to Stand - Progress: Met PT Goal: Stand to Sit - Progress: Met PT Goal: Ambulate - Progress: Progressing toward goal  Visit Information  Last PT Received On: 12/27/11 Assistance Needed: +1    Subjective Data  Subjective: Pt states that she is feeling alot better   Cognition  Overall Cognitive Status: Impaired Area of Impairment: Safety/judgement;Awareness of errors Arousal/Alertness: Awake/alert Orientation Level: Appears intact for tasks assessed Behavior During Session: [facility] for tasks performed Safety/Judgement: Decreased safety judgement for tasks assessed;Impulsive Awareness of Errors: Assistance required to identify errors  made    Balance     End of Session PT - End of Session Equipment Utilized During Treatment: Gait belt Activity Tolerance: Patient tolerated treatment well Patient left: in chair;with call bell/phone within reach    Fredrich Birks 12/27/2011, 10:33 AM  12/27/2011 Fredrich Birks PTA [phone] pager [phone] office

## 2011-12-27 NOTE — Progress Notes (Deleted)
CSW met with pt to address discharge plan. [facility] has a bed available for pt. Pt is agreeable to discharge plan. CSW will facilitate discharge to [facility].   Dede Query, MSW, Theresia Majors [phone]

## 2011-12-27 NOTE — Progress Notes (Signed)
Occupational Therapy Treatment Patient Details Name: [name] MRN: [id number] DOB: 01/06/1961 Today's Date: 12/27/2011 Time: 7846-9629 OT Time Calculation (min): 32 min  OT Assessment / Plan / Recommendation Comments on Treatment Session Pt continues to gain functional use of L UE with good alignment. Mobility is improving for ADL .    Follow Up Recommendations  Skilled nursing facility    Equipment Recommendations  Defer to next venue    Frequency Min 2X/week   Plan Discharge plan remains appropriate    Precautions / Restrictions Precautions Precautions: Fall   Pertinent Vitals/Pain NA    ADL  Toilet Transfer: Minimal assistance;Other (comment);Performed (min guard) Toilet Transfer Method: Proofreader: Regular height toilet;Grab bars Toileting - Clothing Manipulation: Performed;Supervision/safety Where Assessed - Toileting Clothing Manipulation: Standing Toileting - Hygiene: Performed;Independent Equipment Used: Gait belt;Rolling walker Ambulation Related to ADLs: min guard assist with RW ADL Comments: Pt performed assisted PNF patterns with L UE followed by reaching in multiple planes with 1# wt on wrist.  Instructed and issued med soft t-putty for facilitating progression of grasp patterns and manipulation skills.    OT Goals ADL Goals Pt Will Perform Grooming: with set-up;Sitting, chair;Supported ADL Goal: Grooming - Progress: Partly met Pt Will Transfer to Toilet: with mod assist;3-in-1 ADL Goal: Statistician - Progress: Met Pt Will Perform Toileting - Clothing Manipulation: with mod assist;Sitting on 3-in-1 or toilet ADL Goal: Toileting - Clothing Manipulation - Progress: Met Pt Will Perform Toileting - Hygiene: with mod assist;Sit to stand from 3-in-1/toilet ADL Goal: Toileting - Hygiene - Progress: Met Miscellaneous OT Goals Miscellaneous OT Goal #2: Pt will be independent in L UE AROM, theraputty exercise program. OT Goal:  Miscellaneous Goal #2 - Progress: Goal set today  Visit Information  Last OT Received On: 12/27/11 Assistance Needed: +1                 Cognition  Overall Cognitive Status: Impaired Area of Impairment: Safety/judgement;Awareness of errors Arousal/Alertness: Awake/alert Orientation Level: Appears intact for tasks assessed Behavior During Session: [facility] for tasks performed Safety/Judgement: Decreased safety judgement for tasks assessed;Impulsive Awareness of Errors: Assistance required to identify errors made    Mobility Bed Mobility Bed Mobility: Not assessed Transfers Sit to Stand: With armrests;From chair/3-in-1;With upper extremity assist;4: Min guard Stand to Sit: 6: Modified independent (Device/Increase time);With upper extremity assist;To chair/3-in-1;To toilet   Exercises        End of Session OT - End of Session Equipment Utilized During Treatment: Gait belt Activity Tolerance: Patient tolerated treatment well Patient left: in chair;with call bell/phone within reach;with nursing in room   Evern Bio 12/27/2011, 1:11 PM [phone]

## 2011-12-27 NOTE — Progress Notes (Signed)
Stroke Team Progress Note  HISTORY [name] is a 51 AA Female with multiple comorbidities including HIV, and coronary artery disease as well as hyperlipidemia who presented to the emergency room on 12/18/2011 with slurred speech, aphasia, left facial droop, and left hemiparesis. Diagnostic evaluation demonstrated acute infarct by MRI affecting right temporal region. Her NIH stroke scale score was 11 on presentation. She received IV tPA and was taken to IR but no intervention could be performed.    SUBJECTIVE Pt received bed offers yesterday. She likes [facility].  OBJECTIVE Most recent Vital Signs: Filed Vitals:   12/26/11 0632 12/26/11 1457 12/26/11 2200 12/27/11 0600  BP: 121/82 114/72 113/71 116/70  Pulse: 74 83 68 78  Temp: 97.9 F (36.6 C) 98.2 F (36.8 C) 97.9 F (36.6 C) 98.2 F (36.8 C)  TempSrc: Oral Oral    Resp: 20 22 20 20   Height:      Weight:      SpO2: 96% 100% 96% 98%    MEDICATIONS    . aspirin EC  325 mg Oral Daily  . carvedilol  3.125 mg Oral BID WC  . darunavir  800 mg Oral Q breakfast  . emtricitabine-tenofovir  1 tablet Oral Daily  . gabapentin  400 mg Oral TID  . isosorbide mononitrate  60 mg Oral Daily  . pantoprazole  40 mg Oral Daily  . ritonavir  100 mg Oral BID  . traZODone  50 mg Oral QHS  . valACYclovir  500 mg Oral BID   PRN:  acetaminophen, albuterol, nitroGLYCERIN, ondansetron, oxyCODONE, oxyCODONE-acetaminophen, senna-docusate, traMADol  Diet:  Cardiac thin liquids Activity:  OOB DVT Prophylaxis:  SCDs   CLINICALLY SIGNIFICANT STUDIES CBC    Component Value Date/Time   WBC 4.5 12/19/2011 0510   RBC 4.65 12/19/2011 0510   HGB 11.9* 12/19/2011 0510   HCT 36.9 12/19/2011 0510   PLT 170 12/19/2011 0510   MCV 79.4 12/19/2011 0510   MCH 25.6* 12/19/2011 0510   MCHC 32.2 12/19/2011 0510   RDW 14.7 12/19/2011 0510   LYMPHSABS 2.4 12/18/2011 1320   MONOABS 0.6 12/18/2011 1320   EOSABS 0.1 12/18/2011 1320   BASOSABS 0.0 12/18/2011 1320    CMP    Component Value Date/Time   NA 142 12/19/2011 0510   K 3.7 12/19/2011 0510   CL 109 12/19/2011 0510   CO2 22 12/19/2011 0510   GLUCOSE 81 12/19/2011 0510   BUN 7 12/19/2011 0510   CREATININE 0.63 12/19/2011 0510   CREATININE 0.64 11/23/2011 1144   CALCIUM 9.2 12/19/2011 0510   PROT 8.4* 12/18/2011 1320   ALBUMIN 3.6 12/18/2011 1320   AST 47* 12/18/2011 1320   ALT 42* 12/18/2011 1320   ALKPHOS 46 12/18/2011 1320   BILITOT 0.7 12/18/2011 1320   GFRNONAA >90 12/19/2011 0510   GFRAA >90 12/19/2011 0510   COAGS Lab Results  Component Value Date   INR 0.98 12/18/2011   INR 0.98 12/22/2010   INR 1.00 01/24/2010   Lipid Panel    Component Value Date/Time   CHOL 189 12/19/2011 0510   TRIG 77 12/19/2011 0510   HDL 40 12/19/2011 0510   CHOLHDL 4.7 12/19/2011 0510   VLDL 15 12/19/2011 0510   LDLCALC 134* 12/19/2011 0510   HgbA1C  Lab Results  Component Value Date   HGBA1C 5.5 12/19/2011   Cardiac Panel (last 3 results)  No results found for this basename: CKTOTAL:3,CKMB:3,TROPONINI:3,RELINDX:3 in the last 72 hours Urinalysis    Component Value Date/Time  COLORURINE YELLOW 12/18/2011 1432   APPEARANCEUR CLOUDY* 12/18/2011 1432   LABSPEC 1.015 12/18/2011 1432   PHURINE 6.5 12/18/2011 1432   GLUCOSEU NEGATIVE 12/18/2011 1432   HGBUR NEGATIVE 12/18/2011 1432   BILIRUBINUR NEGATIVE 12/18/2011 1432   KETONESUR 15* 12/18/2011 1432   PROTEINUR 30* 12/18/2011 1432   UROBILINOGEN 0.2 12/18/2011 1432   NITRITE NEGATIVE 12/18/2011 1432   LEUKOCYTESUR NEGATIVE 12/18/2011 1432   Urine Drug Screen    Component Value Date/Time   LABOPIA POSITIVE* 05/24/2011 1800   LABOPIA NEGATIVE 05/23/2009 0542   COCAINSCRNUR POSITIVE* 05/24/2011 1800   COCAINSCRNUR NEGATIVE 05/23/2009 0542   LABBENZ NONE DETECTED 05/24/2011 1800   LABBENZ NEGATIVE 05/23/2009 0542   AMPHETMU NONE DETECTED 05/24/2011 1800   AMPHETMU NEGATIVE 05/23/2009 0542   THCU POSITIVE* 05/24/2011 1800   LABBARB NONE DETECTED 05/24/2011 1800    Alcohol  Level    Component Value Date/Time   ETH  Value: <11        LOWEST DETECTABLE LIMIT FOR SERUM ALCOHOL IS 5 mg/dL FOR MEDICAL PURPOSES ONLY* 01/20/2011 1144   CT of the brain   No evidence of acute intracranial abnormality.  Remote right cerebellar infarct.   Cerebral angio  S/P bilateral caritid and lt vert artery angiogram, Rt CFA approach, Distal rt MCA post perisylvian branch occlusion.  MRI of the brain   12/19/2011 1.  The study is markedly degraded by patient motion, exaggerating medium and small vessel disease. 2.  The focal occluded right MCA branch vessel is below the sensitivity of this exam 12/18/2011  1.  Acute non hemorrhagic infarct involving the posterior right insular cortex and posterior right frontal lobe. 2.  Minimal white matter disease may be within normal limits for age. 3.  Postoperative changes of the right globe. 4.  Remote infarct of the right cerebellum.   MRA of the brain   1.  The study is markedly degraded by patient motion, exaggerating medium and small vessel disease. 2.  The focal occluded right MCA branch vessel is below the sensitivity of this exam.   2D Echocardiogram  EF 30-35% with hypokinesis of the inferior wall (base, mid, distal) and anteroseptal walls; akinesis of the distal anterior wall; akinesis of the distal inferoseptal, distal lateral and apical walls. No source of embolus.  Carotid Doppler  No internal carotid artery stenosis bilaterally. Vertebrals with antegrade flow bilaterally.   Bilateral lower extremity venous duplex No obvious evidence of deep vein thrombosis bilaterally. All visible veins were compressible.  CXR   1.  Low lung volumes. 2.  No acute cardiopulmonary disease.   EKG  normal sinus rhythm.   TEE anteroseptal, apical and distal inferior akinesis; overall severely reduced LV function; EF 25-30%; No apical thrombus; mild MR; trace AI and TR; positive saline microcavitation study.  LE Venous Doppler No obvious evidence of deep  vein thrombosis bilaterally. All visible veins were compressible.  Neurological Exam    Awake alert.. Moderate dysarthria. Moderate left lower face asymmetry. Tongue midline.Pronounced left sided Drift.Left hemiparesis with 4+/5 strength on left. Orbits right over left upper extremity. Mild left grip weak. Diminished left hemibody sensation. No dysmetria.   ASSESSMENT [name] is a 51 y.o. 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 Status post IV t-PA 12/18/2011 at 1420.  On aspirin 81 mg orally every day prior to admission. Now on aspirin 325 mg orally every day for secondary stroke prevention. No embolic source found in  workup. Patient with resultant left facial droop, left hemiparesis, slurred speech and decreased sensation left side. Needs rehab at discharge. Medically ready for discharge.  -PFO (patent foramen ovale) likely an incidental finding. -opiates, cocaine, THC. Adamantly denies recent use. Agreeable to stop. -hyperlipidemia- LDL 134, not at goal <100. Intolerant to lipitor. Has tried crestor without difficulty in the past. -hypertension -HIV -CAD s/p stents -morbid obesity Body mass index is 46.90 kg/(m^2).  Hospital day # 8  TREATMENT/PLAN -Continue aspirin 325 mg orally every day for secondary stroke prevention.  -outpatient telemetry monitoring to assess patient for atrial fibrillation as source of stroke. Will arrange after rehab stay at followup appt. -add crestor -SNF when bed available  Annie Main, AVNP, ANP-BC, GNP-BC [facility] Pager: [phone] 12/27/2011 8:38 AM  Dr. Delia Heady, Stroke Center Medical Director, has personally reviewed chart, pertinent data, examined the patient and developed the plan of care.

## 2011-12-28 DIAGNOSIS — Q211 Atrial septal defect: Secondary | ICD-10-CM

## 2011-12-28 DIAGNOSIS — Q2112 Patent foramen ovale: Secondary | ICD-10-CM

## 2011-12-28 MED ORDER — ROSUVASTATIN CALCIUM 5 MG PO TABS: 5.0000 mg | ORAL_TABLET | Freq: Every day | ORAL | Status: DC

## 2011-12-28 MED ORDER — RITONAVIR 100 MG PO CAPS: 100.0000 mg | ORAL_CAPSULE | Freq: Two times a day (BID) | ORAL | Status: DC

## 2011-12-28 MED ORDER — ONDANSETRON HCL 4 MG PO TABS
4.0000 mg | ORAL_TABLET | Freq: Four times a day (QID) | ORAL | Status: DC | PRN
  Administered 2011-12-28: 4 mg via ORAL
  Filled 2011-12-28: qty 1

## 2011-12-28 MED ORDER — ISOSORBIDE MONONITRATE ER 60 MG PO TB24: 60.0000 mg | ORAL_TABLET | Freq: Every day | ORAL | Status: DC

## 2011-12-28 MED ORDER — ASPIRIN 325 MG PO TBEC: 325.0000 mg | DELAYED_RELEASE_TABLET | Freq: Every day | ORAL | Status: AC

## 2011-12-28 MED ORDER — EMTRICITABINE-TENOFOVIR DF 200-300 MG PO TABS: 1.0000 | ORAL_TABLET | Freq: Every day | ORAL | Status: DC

## 2011-12-28 MED ORDER — DARUNAVIR ETHANOLATE 400 MG PO TABS: 800.0000 mg | ORAL_TABLET | Freq: Every day | ORAL | Status: DC

## 2011-12-28 MED ORDER — VALACYCLOVIR HCL 500 MG PO TABS: 500.0000 mg | ORAL_TABLET | Freq: Two times a day (BID) | ORAL | Status: DC

## 2011-12-28 MED ORDER — CARVEDILOL 3.125 MG PO TABS: 3.1250 mg | ORAL_TABLET | Freq: Two times a day (BID) | ORAL | Status: DC

## 2011-12-28 NOTE — Progress Notes (Signed)
. Stroke Team Progress Note  HISTORY [name] is a 51 AA Female with multiple comorbidities including HIV, and coronary artery disease as well as hyperlipidemia who presented to the emergency room on 12/18/2011 with slurred speech, aphasia, left facial droop, and left hemiparesis. Diagnostic evaluation demonstrated acute infarct by MRI affecting right temporal region. Her NIH stroke scale score was 11 on presentation. She received IV tPA and was taken to IR but no intervention could be performed.    SUBJECTIVE Pt received bed offers yesterday. She likes [facility] and accepted a bed there.  OBJECTIVE Most recent Vital Signs: Filed Vitals:   12/27/11 2252 12/28/11 0142 12/28/11 0538 12/28/11 0946  BP: 115/77 113/71 109/76   Pulse: 87 86 83   Temp: 98 F (36.7 C) 98.2 F (36.8 C) 98.1 F (36.7 C)   TempSrc: Oral Oral Oral   Resp: 18 20 18    Height:      Weight:      SpO2: 97% 97% 98% 96%    MEDICATIONS    . aspirin EC  325 mg Oral Daily  . carvedilol  3.125 mg Oral BID WC  . darunavir  800 mg Oral Q breakfast  . emtricitabine-tenofovir  1 tablet Oral Daily  . gabapentin  400 mg Oral TID  . isosorbide mononitrate  60 mg Oral Daily  . pantoprazole  40 mg Oral Daily  . ritonavir  100 mg Oral BID  . rosuvastatin  5 mg Oral q1800  . traZODone  50 mg Oral QHS  . valACYclovir  500 mg Oral BID  . DISCONTD: NON FORMULARY 5 mg  5 mg Oral QHS   PRN:  acetaminophen, albuterol, nitroGLYCERIN, ondansetron, oxyCODONE, oxyCODONE-acetaminophen, senna-docusate, traMADol  Diet:  Cardiac thin liquids Activity:  OOB DVT Prophylaxis:  SCDs   CLINICALLY SIGNIFICANT STUDIES CBC    Component Value Date/Time   WBC 4.5 12/19/2011 0510   RBC 4.65 12/19/2011 0510   HGB 11.9* 12/19/2011 0510   HCT 36.9 12/19/2011 0510   PLT 170 12/19/2011 0510   MCV 79.4 12/19/2011 0510   MCH 25.6* 12/19/2011 0510   MCHC 32.2 12/19/2011 0510   RDW 14.7 12/19/2011 0510   LYMPHSABS 2.4 12/18/2011 1320   MONOABS  0.6 12/18/2011 1320   EOSABS 0.1 12/18/2011 1320   BASOSABS 0.0 12/18/2011 1320   CMP    Component Value Date/Time   NA 142 12/19/2011 0510   K 3.7 12/19/2011 0510   CL 109 12/19/2011 0510   CO2 22 12/19/2011 0510   GLUCOSE 81 12/19/2011 0510   BUN 7 12/19/2011 0510   CREATININE 0.63 12/19/2011 0510   CREATININE 0.64 11/23/2011 1144   CALCIUM 9.2 12/19/2011 0510   PROT 8.4* 12/18/2011 1320   ALBUMIN 3.6 12/18/2011 1320   AST 47* 12/18/2011 1320   ALT 42* 12/18/2011 1320   ALKPHOS 46 12/18/2011 1320   BILITOT 0.7 12/18/2011 1320   GFRNONAA >90 12/19/2011 0510   GFRAA >90 12/19/2011 0510   COAGS Lab Results  Component Value Date   INR 0.98 12/18/2011   INR 0.98 12/22/2010   INR 1.00 01/24/2010   Lipid Panel    Component Value Date/Time   CHOL 189 12/19/2011 0510   TRIG 77 12/19/2011 0510   HDL 40 12/19/2011 0510   CHOLHDL 4.7 12/19/2011 0510   VLDL 15 12/19/2011 0510   LDLCALC 134* 12/19/2011 0510   HgbA1C  Lab Results  Component Value Date   HGBA1C 5.5 12/19/2011   Cardiac Panel (  last 3 results)  No results found for this basename: CKTOTAL:3,CKMB:3,TROPONINI:3,RELINDX:3 in the last 72 hours Urinalysis    Component Value Date/Time   COLORURINE YELLOW 12/18/2011 1432   APPEARANCEUR CLOUDY* 12/18/2011 1432   LABSPEC 1.015 12/18/2011 1432   PHURINE 6.5 12/18/2011 1432   GLUCOSEU NEGATIVE 12/18/2011 1432   HGBUR NEGATIVE 12/18/2011 1432   BILIRUBINUR NEGATIVE 12/18/2011 1432   KETONESUR 15* 12/18/2011 1432   PROTEINUR 30* 12/18/2011 1432   UROBILINOGEN 0.2 12/18/2011 1432   NITRITE NEGATIVE 12/18/2011 1432   LEUKOCYTESUR NEGATIVE 12/18/2011 1432   Urine Drug Screen    Component Value Date/Time   LABOPIA POSITIVE* 05/24/2011 1800   LABOPIA NEGATIVE 05/23/2009 0542   COCAINSCRNUR POSITIVE* 05/24/2011 1800   COCAINSCRNUR NEGATIVE 05/23/2009 0542   LABBENZ NONE DETECTED 05/24/2011 1800   LABBENZ NEGATIVE 05/23/2009 0542   AMPHETMU NONE DETECTED 05/24/2011 1800   AMPHETMU NEGATIVE 05/23/2009 0542    THCU POSITIVE* 05/24/2011 1800   LABBARB NONE DETECTED 05/24/2011 1800    Alcohol Level    Component Value Date/Time   ETH  Value: <11        LOWEST DETECTABLE LIMIT FOR SERUM ALCOHOL IS 5 mg/dL FOR MEDICAL PURPOSES ONLY* 01/20/2011 1144   CT of the brain   No evidence of acute intracranial abnormality.  Remote right cerebellar infarct.   Cerebral angio  S/P bilateral caritid and lt vert artery angiogram, Rt CFA approach, Distal rt MCA post perisylvian branch occlusion.  MRI of the brain   12/19/2011 1.  The study is markedly degraded by patient motion, exaggerating medium and small vessel disease. 2.  The focal occluded right MCA branch vessel is below the sensitivity of this exam 12/18/2011  1.  Acute non hemorrhagic infarct involving the posterior right insular cortex and posterior right frontal lobe. 2.  Minimal white matter disease may be within normal limits for age. 3.  Postoperative changes of the right globe. 4.  Remote infarct of the right cerebellum.   MRA of the brain   1.  The study is markedly degraded by patient motion, exaggerating medium and small vessel disease. 2.  The focal occluded right MCA branch vessel is below the sensitivity of this exam.   2D Echocardiogram  EF 30-35% with hypokinesis of the inferior wall (base, mid, distal) and anteroseptal walls; akinesis of the distal anterior wall; akinesis of the distal inferoseptal, distal lateral and apical walls. No source of embolus.  Carotid Doppler  No internal carotid artery stenosis bilaterally. Vertebrals with antegrade flow bilaterally.   Bilateral lower extremity venous duplex No obvious evidence of deep vein thrombosis bilaterally. All visible veins were compressible.  CXR   1.  Low lung volumes. 2.  No acute cardiopulmonary disease.   EKG  normal sinus rhythm.   TEE anteroseptal, apical and distal inferior akinesis; overall severely reduced LV function; EF 25-30%; No apical thrombus; mild MR; trace AI and TR;  positive saline microcavitation study.  LE Venous Doppler No obvious evidence of deep vein thrombosis bilaterally. All visible veins were compressible.  Neurological Exam    Awake alert.. Moderate dysarthria. Moderate left lower face asymmetry. Tongue midline.Pronounced left sided Drift.Left hemiparesis with 4+/5 strength on left. Orbits right over left upper extremity. Mild left grip weak. Diminished left hemibody sensation. No dysmetria.   ASSESSMENT [name] is a 51 y.o. 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 Status post IV t-PA 12/18/2011 at 1420.  On aspirin  81 mg orally every day prior to admission. Now on aspirin 325 mg orally every day for secondary stroke prevention. No embolic source found in workup. Patient with resultant left facial droop, left hemiparesis, slurred speech and decreased sensation left side. Needs rehab at discharge. Medically ready for discharge. Has accepted a bed offer.  -PFO (patent foramen ovale) likely an incidental finding. -opiates, cocaine, THC. Adamantly denies recent use. Agreeable to stop. -hyperlipidemia- LDL 134, not at goal <100. Intolerant to lipitor. Has tried crestor without difficulty in the past. -hypertension -HIV -CAD s/p stents -morbid obesity Body mass index is 46.90 kg/(m^2).  Hospital day # 8  TREATMENT/PLAN -Continue aspirin 325 mg orally every day for secondary stroke prevention.  -outpatient telemetry monitoring to assess patient for atrial fibrillation as source of stroke. Will arrange after rehab stay at followup appt. -discharge to SNF today  SHARON BIBY, AVNP, ANP-BC, GNP-BC [facility] Pager: [phone] 12/28/2011 10:34 AM  Dr. Delia Heady, Stroke Center Medical Director, has personally reviewed chart, pertinent data, examined the patient and developed the plan of care.
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
## 2011-12-28 NOTE — Discharge Instructions (Signed)
STROKE/TIA DISCHARGE INSTRUCTIONS SMOKING Cigarette smoking nearly doubles your risk of having a stroke & is the single most alterable risk factor  If you smoke or have smoked in the last 12 months, you are advised to quit smoking for your health.  Most of the excess cardiovascular risk related to smoking disappears within a year of stopping.  Ask you doctor about anti-smoking medications   Quit Line: 1-800-QUIT NOW  Free Smoking Cessation Classes [phone]  CHOLESTEROL Know your levels; limit fat & cholesterol in your diet  Lipid Panel     Component Value Date/Time   CHOL 189 12/19/2011 0510   TRIG 77 12/19/2011 0510   HDL 40 12/19/2011 0510   CHOLHDL 4.7 12/19/2011 0510   VLDL 15 12/19/2011 0510   LDLCALC 134* 12/19/2011 0510      Many patients benefit from treatment even if their cholesterol is at goal.  Goal: Total Cholesterol (CHOL) less than 160  Goal:  Triglycerides (TRIG) less than 150  Goal:  HDL greater than 40  Goal:  LDL (LDLCALC) less than 100   BLOOD PRESSURE American Stroke Association blood pressure target is less that 120/80 mm/Hg  Your discharge blood pressure is:  BP: 113/90 mmHg  Monitor your blood pressure  Limit your salt and alcohol intake  Many individuals will require more than one medication for high blood pressure  DIABETES (A1c is a blood sugar average for last 3 months) Goal HGBA1c is under 7% (HBGA1c is blood sugar average for last 3 months)  Diabetes: No known diagnosis of diabetes    Lab Results  Component Value Date   HGBA1C 5.5 12/19/2011     Your HGBA1c can be lowered with medications, healthy diet, and exercise.  Check your blood sugar as directed by your physician  Call your physician if you experience unexplained or low blood sugars.  PHYSICAL ACTIVITY/REHABILITATION Goal is 30 minutes at least 4 days per week    Activity:   Increase activity slowly,, No driving, and Walk with assistance, Therapies:   Physical Therapy:  Nursing Facility, Occupational Therapy: Nursing Facility and Speech Therapy: Nursing Facility Return to work:  TBD  Activity decreases your risk of heart attack and stroke and makes your heart stronger.  It helps control your weight and blood pressure; helps you relax and can improve your mood.  Participate in a regular exercise program.  Talk with your doctor about the best form of exercise for you (dancing, walking, swimming, cycling).  DIET/WEIGHT Goal is to maintain a healthy weight  Your discharge diet is: Cardiac thin liquids Your height is:  Height: 5\' 2"  (157.5 cm) Your current weight is: Weight: 116.3 kg (256 lb 6.3 oz) Your Body Mass Index (BMI) is:  BMI (Calculated): 47   Following the type of diet specifically designed for you will help prevent another stroke.  Your goal weight range is:  ***  Your goal Body Mass Index (BMI) is 19-24.  Healthy food habits can help reduce 3 risk factors for stroke:  High cholesterol, hypertension, and excess weight.  RESOURCES Stroke/Support Group:  Call [phone]  they meet the 3rd Sunday of the month on the Rehab Unit at [facility] [place] ( no meetings June, July & Aug).  STROKE EDUCATION PROVIDED/REVIEWED AND GIVEN TO PATIENT Stroke warning signs and symptoms How to activate emergency medical system (call 911). Medications prescribed at discharge. Need for follow-up after discharge. Personal risk factors for stroke. Pneumonia vaccine given:   {STROKE DC YES/NO/DATE:22363} Flu vaccine  given:   {STROKE DC YES/NO/DATE:22363} My questions have been answered, the writing is legible, and I understand these instructions.  I will adhere to these goals & educational materials that have been provided to me after my discharge from the hospital.

## 2011-12-28 NOTE — Progress Notes (Signed)
Pt's discharge has been delayed due to retraction of bed offer from [facility] at 3:50 PM. Facility cited pt's secondary payor will not cover SNF stay because it is community Medicaid. CSW contacted a second facility, which made a bed offer and they are able to accept pt pending [employer] Auth. CSW sent clinicals in order to initiate auth. Pt is aware and agreeable to [facility]. CSW will continue to follow facilitate discharge.   Dede Query, MSW, [facility] [phone]

## 2011-12-29 MED ORDER — WHITE PETROLATUM GEL
Status: AC
  Administered 2011-12-29: 13:00:00
  Filled 2011-12-29: qty 5

## 2011-12-29 NOTE — Progress Notes (Signed)
Physical Therapy Treatment Patient Details Name: [name] MRN: [id number] DOB: Jul 09, 1961 Today's Date: 12/29/2011 Time: 7829-5621 PT Time Calculation (min): 16 min  PT Assessment / Plan / Recommendation Comments on Treatment Session  Pt amb without LOB or unsteadiness but lack awareness of safety. Bumped into objects instead of going around them.    Follow Up Recommendations  Skilled nursing facility    Equipment Recommendations  Defer to next venue    Frequency Min 4X/week   Plan Discharge plan remains appropriate;Frequency remains appropriate    Precautions / Restrictions Precautions Precautions: Back;Fall   Pertinent Vitals/Pain     Mobility  Bed Mobility Bed Mobility: Rolling Right;Right Sidelying to Sit;Sitting - Scoot to Edge of Bed Rolling Right: 5: Supervision Right Sidelying to Sit: 5: Supervision Sitting - Scoot to Edge of Bed: 5: Supervision Transfers Transfers: Sit to Stand;Stand to Sit Sit to Stand: 5: Supervision;With upper extremity assist;From bed Stand to Sit: 5: Supervision;With upper extremity assist;To bed Ambulation/Gait Ambulation/Gait Assistance: 5: Supervision Ambulation Distance (Feet): 230 Feet Assistive device: Rolling walker Ambulation/Gait Assistance Details: VC's for safety awareness  Gait Pattern: Step-through pattern;Decreased stride length Stairs: No Modified Rankin (Stroke Patients Only) Modified Rankin: Moderately severe disability    Exercises Shoulder Exercises Shoulder Flexion: AROM;Left;Seated;10 reps Shoulder Extension: AROM;Left;Seated;10 reps Shoulder ABduction: AROM;Left;10 reps;Seated Elbow Flexion: AROM;Left;10 reps;Seated Elbow Extension: AROM;Left;10 reps;Seated Other Exercises Other Exercises: Pt performed theraputty HEP with min verbal cueing for technique.  Pt used Left hand to manipulate theraputty incorporating gross grasp and fine motor skills. Pt able to recall sequencing of theraputty HEP independently.    PT Goals Acute Rehab PT Goals PT Goal: Supine/Side to Sit - Progress: Progressing toward goal PT Goal: Sit to Supine/Side - Progress: Progressing toward goal PT Goal: Sit to Stand - Progress: Progressing toward goal PT Goal: Stand to Sit - Progress: Progressing toward goal PT Goal: Ambulate - Progress: Progressing toward goal  Visit Information  Last PT Received On: 12/29/11 Assistance Needed: +1    Subjective Data      Cognition  Overall Cognitive Status: Impaired Area of Impairment: Safety/judgement;Awareness of errors Arousal/Alertness: Awake/alert Orientation Level: Appears intact for tasks assessed Behavior During Session: [facility] for tasks performed Safety/Judgement: Decreased safety judgement for tasks assessed Awareness of Errors: Assistance required to identify errors made    Balance     End of Session PT - End of Session Equipment Utilized During Treatment: Gait belt Activity Tolerance: Patient tolerated treatment well Patient left: in bed;with call bell/phone within reach;with bed alarm set    Tamera Stands 12/29/2011, 3:40 PM

## 2011-12-29 NOTE — Progress Notes (Signed)
Stroke Team Progress Note  HISTORY [name] is a 51 AA Female with multiple comorbidities including HIV, and coronary artery disease as well as hyperlipidemia who presented to the emergency room on 12/18/2011 with slurred speech, aphasia, left facial droop, and left hemiparesis. Diagnostic evaluation demonstrated acute infarct by MRI affecting right temporal region. Her NIH stroke scale score was 11 on presentation. She received IV tPA and was taken to IR but no intervention could be performed.    SUBJECTIVE Pt received bed offers yesterday. She likes [facility] and accepted a bed there. [facility] then retracted bed offer. Today she has a bed at [facility]. Insurance authorization pending.  OBJECTIVE Most recent Vital Signs: Filed Vitals:   12/28/11 1326 12/28/11 1901 12/28/11 2136 12/29/11 0558  BP: 111/82 115/67 109/72 107/72  Pulse: 89 87 82 80  Temp: 98.4 F (36.9 C) 98.4 F (36.9 C) 98.2 F (36.8 C) 98.1 F (36.7 C)  TempSrc: Oral Oral Oral Oral  Resp: 18 18 20 20   Height:      Weight:      SpO2: 98% 96% 96% 95%    MEDICATIONS     . aspirin EC  325 mg Oral Daily  . carvedilol  3.125 mg Oral BID WC  . darunavir  800 mg Oral Q breakfast  . emtricitabine-tenofovir  1 tablet Oral Daily  . gabapentin  400 mg Oral TID  . isosorbide mononitrate  60 mg Oral Daily  . pantoprazole  40 mg Oral Daily  . ritonavir  100 mg Oral BID  . rosuvastatin  5 mg Oral q1800  . traZODone  50 mg Oral QHS  . valACYclovir  500 mg Oral BID   PRN:  acetaminophen, albuterol, nitroGLYCERIN, ondansetron, ondansetron, oxyCODONE, oxyCODONE-acetaminophen, senna-docusate, traMADol  Diet:  Cardiac thin liquids Activity:  OOB DVT Prophylaxis:  SCDs   CLINICALLY SIGNIFICANT STUDIES CBC    Component Value Date/Time   WBC 4.5 12/19/2011 0510   RBC 4.65 12/19/2011 0510   HGB 11.9* 12/19/2011 0510   HCT 36.9 12/19/2011 0510   PLT 170 12/19/2011 0510   MCV 79.4 12/19/2011 0510   MCH 25.6*  12/19/2011 0510   MCHC 32.2 12/19/2011 0510   RDW 14.7 12/19/2011 0510   LYMPHSABS 2.4 12/18/2011 1320   MONOABS 0.6 12/18/2011 1320   EOSABS 0.1 12/18/2011 1320   BASOSABS 0.0 12/18/2011 1320   CMP    Component Value Date/Time   NA 142 12/19/2011 0510   K 3.7 12/19/2011 0510   CL 109 12/19/2011 0510   CO2 22 12/19/2011 0510   GLUCOSE 81 12/19/2011 0510   BUN 7 12/19/2011 0510   CREATININE 0.63 12/19/2011 0510   CREATININE 0.64 11/23/2011 1144   CALCIUM 9.2 12/19/2011 0510   PROT 8.4* 12/18/2011 1320   ALBUMIN 3.6 12/18/2011 1320   AST 47* 12/18/2011 1320   ALT 42* 12/18/2011 1320   ALKPHOS 46 12/18/2011 1320   BILITOT 0.7 12/18/2011 1320   GFRNONAA >90 12/19/2011 0510   GFRAA >90 12/19/2011 0510   COAGS Lab Results  Component Value Date   INR 0.98 12/18/2011   INR 0.98 12/22/2010   INR 1.00 01/24/2010   Lipid Panel    Component Value Date/Time   CHOL 189 12/19/2011 0510   TRIG 77 12/19/2011 0510   HDL 40 12/19/2011 0510   CHOLHDL 4.7 12/19/2011 0510   VLDL 15 12/19/2011 0510   LDLCALC 134* 12/19/2011 0510   HgbA1C  Lab Results  Component Value Date  HGBA1C 5.5 12/19/2011   Cardiac Panel (last 3 results)  No results found for this basename: CKTOTAL:3,CKMB:3,TROPONINI:3,RELINDX:3 in the last 72 hours Urinalysis    Component Value Date/Time   COLORURINE YELLOW 12/18/2011 1432   APPEARANCEUR CLOUDY* 12/18/2011 1432   LABSPEC 1.015 12/18/2011 1432   PHURINE 6.5 12/18/2011 1432   GLUCOSEU NEGATIVE 12/18/2011 1432   HGBUR NEGATIVE 12/18/2011 1432   BILIRUBINUR NEGATIVE 12/18/2011 1432   KETONESUR 15* 12/18/2011 1432   PROTEINUR 30* 12/18/2011 1432   UROBILINOGEN 0.2 12/18/2011 1432   NITRITE NEGATIVE 12/18/2011 1432   LEUKOCYTESUR NEGATIVE 12/18/2011 1432   Urine Drug Screen    Component Value Date/Time   LABOPIA POSITIVE* 05/24/2011 1800   LABOPIA NEGATIVE 05/23/2009 0542   COCAINSCRNUR POSITIVE* 05/24/2011 1800   COCAINSCRNUR NEGATIVE 05/23/2009 0542   LABBENZ NONE DETECTED 05/24/2011 1800    LABBENZ NEGATIVE 05/23/2009 0542   AMPHETMU NONE DETECTED 05/24/2011 1800   AMPHETMU NEGATIVE 05/23/2009 0542   THCU POSITIVE* 05/24/2011 1800   LABBARB NONE DETECTED 05/24/2011 1800    Alcohol Level    Component Value Date/Time   ETH  Value: <11        LOWEST DETECTABLE LIMIT FOR SERUM ALCOHOL IS 5 mg/dL FOR MEDICAL PURPOSES ONLY* 01/20/2011 1144   CT of the brain   No evidence of acute intracranial abnormality.  Remote right cerebellar infarct.   Cerebral angio  S/P bilateral caritid and lt vert artery angiogram, Rt CFA approach, Distal rt MCA post perisylvian branch occlusion.  MRI of the brain   12/19/2011 1.  The study is markedly degraded by patient motion, exaggerating medium and small vessel disease. 2.  The focal occluded right MCA branch vessel is below the sensitivity of this exam 12/18/2011  1.  Acute non hemorrhagic infarct involving the posterior right insular cortex and posterior right frontal lobe. 2.  Minimal white matter disease may be within normal limits for age. 3.  Postoperative changes of the right globe. 4.  Remote infarct of the right cerebellum.   MRA of the brain   1.  The study is markedly degraded by patient motion, exaggerating medium and small vessel disease. 2.  The focal occluded right MCA branch vessel is below the sensitivity of this exam.   2D Echocardiogram  EF 30-35% with hypokinesis of the inferior wall (base, mid, distal) and anteroseptal walls; akinesis of the distal anterior wall; akinesis of the distal inferoseptal, distal lateral and apical walls. No source of embolus.  Carotid Doppler  No internal carotid artery stenosis bilaterally. Vertebrals with antegrade flow bilaterally.   Bilateral lower extremity venous duplex No obvious evidence of deep vein thrombosis bilaterally. All visible veins were compressible.  CXR   1.  Low lung volumes. 2.  No acute cardiopulmonary disease.   EKG  normal sinus rhythm.   TEE anteroseptal, apical and distal inferior  akinesis; overall severely reduced LV function; EF 25-30%; No apical thrombus; mild MR; trace AI and TR; positive saline microcavitation study.  LE Venous Doppler No obvious evidence of deep vein thrombosis bilaterally. All visible veins were compressible.  Neurological Exam    Awake alert.. Moderate dysarthria. Moderate left lower face asymmetry. Tongue midline.Pronounced left sided Drift.Left hemiparesis with 4+/5 strength on left. Orbits right over left upper extremity. Mild left grip weak. Diminished left hemibody sensation. No dysmetria.   ASSESSMENT [name] is a 51 y.o. 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 Status post IV  t-PA 12/18/2011 at 1420.  On aspirin 81 mg orally every day prior to admission. Now on aspirin 325 mg orally every day for secondary stroke prevention. No embolic source found in workup. Patient with resultant left facial droop, left hemiparesis, slurred speech and decreased sensation left side. Needs rehab at discharge. Medically ready for discharge. Has accepted a bed offer.  -PFO (patent foramen ovale) likely an incidental finding. -opiates, cocaine, THC. Adamantly denies recent use. Agreeable to stop. -hyperlipidemia- LDL 134, not at goal <100. Intolerant to lipitor. Has tried crestor without difficulty in the past. -hypertension -HIV -CAD s/p stents -morbid obesity Body mass index is 46.90 kg/(m^2).  Hospital day # 8  TREATMENT/PLAN -Continue aspirin 325 mg orally every day for secondary stroke prevention.  -outpatient telemetry monitoring to assess patient for atrial fibrillation as source of stroke. Will arrange after rehab stay at followup appt. -hopefully discharge to SNF today  SHARON BIBY, AVNP, ANP-BC, GNP-BC [facility] Pager: [phone] 12/29/2011 9:41 AM  Dr. Delia Heady, Stroke Center Medical Director, has personally reviewed chart, pertinent data, examined  the patient and developed the plan of care.

## 2011-12-29 NOTE — Progress Notes (Signed)
Occupational Therapy Treatment Patient Details Name: [name] MRN: [id number] DOB: 11-26-1960 Today's Date: 12/29/2011 Time: 2130-8657 OT Time Calculation (min): 11 min  OT Assessment / Plan / Recommendation Comments on Treatment Session Pt continues to demonstrate improved LUE function.    Follow Up Recommendations  Skilled nursing facility    Equipment Recommendations  Defer to next venue    Frequency Min 2X/week   Plan Discharge plan remains appropriate    Precautions / Restrictions Precautions Precautions: Fall   Pertinent Vitals/Pain NA    ADL       OT Goals Miscellaneous OT Goals Miscellaneous OT Goal #2: Pt will be independent in L UE AROM, theraputty exercise program. OT Goal: Miscellaneous Goal #2 - Progress: Met  Visit Information  Last OT Received On: 12/29/11 Assistance Needed: +1    Subjective Data  Subjective: I hope to leave today.   Prior Functioning       Cognition  Arousal/Alertness: Awake/alert Orientation Level: Appears intact for tasks assessed Behavior During Session: [name] for tasks performed    Mobility     Exercises Shoulder Exercises Shoulder Flexion: AROM;Left;Seated;10 reps Shoulder Extension: AROM;Left;Seated;10 reps Shoulder ABduction: AROM;Left;10 reps;Seated Elbow Flexion: AROM;Left;10 reps;Seated Elbow Extension: AROM;Left;10 reps;Seated Other Exercises Other Exercises: Pt performed theraputty HEP with min verbal cueing for technique.  Pt used Left hand to manipulate theraputty incorporating gross grasp and fine motor skills. Pt able to recall sequencing of theraputty HEP independently.  Balance    End of Session OT - End of Session Activity Tolerance: Patient tolerated treatment well Patient left: in chair;with call bell/phone within reach;with nursing in room  12/29/2011 Cipriano Mile OTR/L Pager [phone] Office [phone]  Cipriano Mile 12/29/2011, 2:37 PM

## 2011-12-29 NOTE — Progress Notes (Signed)
Patient is d/c to SNF today by [facility] via Ambulance, d/c and medication instructions given and signed. Assessments remained unchanged prior to d/c

## 2011-12-29 NOTE — Progress Notes (Signed)
Pt is ready for discharge today to [facility]. [facility] obtained [employer] and is ready to admit pt as they have also received discharge summary. [facility] provided transportation. Pt is agreeable to discharge plan. CSW is signing off as no further clinical social work needs identified.   Dede Query, MSW, [facility]ors [phone]

## 2011-12-30 NOTE — Progress Notes (Signed)
12/30/2011 Yeily Link Elizabeth PTA [phone] pager [phone] office    

## 2012-01-06 ENCOUNTER — Other Ambulatory Visit: Payer: Medicare PPO

## 2012-01-06 DIAGNOSIS — B20 Human immunodeficiency virus [HIV] disease: Secondary | ICD-10-CM

## 2012-01-10 LAB — HIV-1 RNA ULTRAQUANT REFLEX TO GENTYP+: HIV 1 RNA Quant: 916 copies/mL — ABNORMAL HIGH (ref <20)

## 2012-01-12 ENCOUNTER — Ambulatory Visit: Payer: Medicare PPO

## 2012-01-20 ENCOUNTER — Encounter: Payer: Self-pay | Admitting: Internal Medicine

## 2012-01-20 ENCOUNTER — Ambulatory Visit (INDEPENDENT_AMBULATORY_CARE_PROVIDER_SITE_OTHER): Payer: Medicare PPO | Admitting: Internal Medicine

## 2012-01-20 VITALS — BP 140/95 | HR 81 | Temp 98.7°F | Ht 62.0 in | Wt 250.8 lb

## 2012-01-20 DIAGNOSIS — B20 Human immunodeficiency virus [HIV] disease: Secondary | ICD-10-CM

## 2012-01-20 DIAGNOSIS — Z0000 Encounter for general adult medical examination without abnormal findings: Secondary | ICD-10-CM

## 2012-01-20 MED ORDER — DARUNAVIR ETHANOLATE 800 MG PO TABS: 800.0000 mg | ORAL_TABLET | Freq: Every day | ORAL | Status: DC

## 2012-01-20 MED ORDER — OXYCODONE-ACETAMINOPHEN 10-325 MG PO TABS: 1.0000 | ORAL_TABLET | ORAL | Status: DC | PRN

## 2012-01-20 NOTE — Progress Notes (Signed)
Patient ID: [name], female   DOB: September 30, 1960, 51 y.o.   MRN: [id number]  INFECTIOUS DISEASE PROGRESS NOTE    Subjective: [name] is in for her hospital followup visit. She was hospitalized last month with an acute ischemic right brain stroke causing left-sided weakness. She was discharged to a skilled nursing facility but has been back living at home with her boyfriend for the past few weeks. At the time of her last visit on April 2 she had been off of her antiretroviral medications. She restarted Truvada, Prezista and Norvir after that visit. She believes she was on all of her medications while hospitalized and denies missing any doses since discharge from the skilled nursing facility. She uses a pillbox and feels it out every Sunday. She can describe taking all of her medication correctly. She recalls her CD4 count and viral load from her last visit as well. She is feeling better but has her chronic joint pain and some residual left-sided weakness particularly in her left arm. She has not scheduled a followup visit yet with her primary care physician, Dr. Yisroel Ramming at [facility].  Objective: Temp: 98.7 F (37.1 C) (05/16 1100) Temp src: Oral (05/16 1100) BP: 140/95 mmHg (05/16 1100) Pulse Rate: 81  (05/16 1100)  General: She is alert and conversant. She does seem to be having some pain in her left shoulder. Skin: No rash Lungs: clear Cor: reg S1 and S2 Neuro: Her memory is fully intact. She has some residual left arm weakness.  Lab Results HIV 1 RNA Quant (copies/mL)  Date Value  01/06/2012 916*  11/23/2011 40926*  05/05/2011 <20      CD4 T Cell Abs (cmm)  Date Value  01/06/2012 550   11/23/2011 320*  05/05/2011 740      Assessment: Her HIV is coming under better control since restarting antiretroviral therapy. She is taking her medications correctly and her adherence seems good. I will continue her current regimen and have her followup in 3  months.  Plan: 1. Continue Truvada, Prezista and Norvir 2. Return after lab work in 3 months 3. She will call [facility] to schedule a followup visit    Cliffton Asters, MD [facility] for Infectious Diseases [facility] [phone] pager   [phone] cell 01/20/2012, 11:30 AM

## 2012-01-20 NOTE — Progress Notes (Signed)
Addended by: Jennet Maduro D on: 01/20/2012 11:51 AM   Modules accepted: Orders

## 2012-01-31 ENCOUNTER — Emergency Department (HOSPITAL_COMMUNITY): Payer: Medicare PPO

## 2012-01-31 ENCOUNTER — Encounter (HOSPITAL_COMMUNITY): Payer: Self-pay | Admitting: *Deleted

## 2012-01-31 ENCOUNTER — Emergency Department (HOSPITAL_COMMUNITY)
Admission: EM | Admit: 2012-01-31 | Discharge: 2012-01-31 | Disposition: A | Discharge status: Home / Self Care | Payer: Medicare PPO | Attending: Emergency Medicine | Admitting: Emergency Medicine

## 2012-01-31 DIAGNOSIS — J441 Chronic obstructive pulmonary disease with (acute) exacerbation: Secondary | ICD-10-CM | POA: Insufficient documentation

## 2012-01-31 DIAGNOSIS — R062 Wheezing: Secondary | ICD-10-CM | POA: Insufficient documentation

## 2012-01-31 DIAGNOSIS — Z79899 Other long term (current) drug therapy: Secondary | ICD-10-CM | POA: Insufficient documentation

## 2012-01-31 DIAGNOSIS — R0602 Shortness of breath: Secondary | ICD-10-CM | POA: Insufficient documentation

## 2012-01-31 DIAGNOSIS — Z21 Asymptomatic human immunodeficiency virus [HIV] infection status: Secondary | ICD-10-CM | POA: Insufficient documentation

## 2012-01-31 DIAGNOSIS — I252 Old myocardial infarction: Secondary | ICD-10-CM | POA: Insufficient documentation

## 2012-01-31 DIAGNOSIS — R609 Edema, unspecified: Secondary | ICD-10-CM | POA: Insufficient documentation

## 2012-01-31 LAB — CBC
Hemoglobin: 10.5 g/dL — ABNORMAL LOW (ref 12.0–15.0)
MCH: 25.5 pg — ABNORMAL LOW (ref 26.0–34.0)
MCV: 81.3 fL (ref 78.0–100.0)
Platelets: 167 10*3/uL (ref 150–400)
RBC: 4.12 MIL/uL (ref 3.87–5.11)
WBC: 4.2 10*3/uL (ref 4.0–10.5)

## 2012-01-31 LAB — URINE MICROSCOPIC-ADD ON

## 2012-01-31 LAB — URINALYSIS, ROUTINE W REFLEX MICROSCOPIC
Bilirubin Urine: NEGATIVE
Ketones, ur: NEGATIVE mg/dL
Nitrite: NEGATIVE
Protein, ur: NEGATIVE mg/dL
Urobilinogen, UA: 0.2 mg/dL (ref 0.0–1.0)

## 2012-01-31 LAB — DIFFERENTIAL
Eosinophils Relative: 17 % — ABNORMAL HIGH (ref 0–5)
Lymphocytes Relative: 30 % (ref 12–46)
Lymphs Abs: 1.3 10*3/uL (ref 0.7–4.0)
Monocytes Relative: 14 % — ABNORMAL HIGH (ref 3–12)
Neutrophils Relative %: 39 % — ABNORMAL LOW (ref 43–77)

## 2012-01-31 LAB — COMPREHENSIVE METABOLIC PANEL
ALT: 12 U/L (ref 0–35)
Alkaline Phosphatase: 45 U/L (ref 39–117)
BUN: 11 mg/dL (ref 6–23)
CO2: 28 mEq/L (ref 19–32)
GFR calc Af Amer: >90 mL/min (ref >90)
GFR calc non Af Amer: >90 mL/min (ref >90)
Glucose, Bld: 90 mg/dL (ref 70–99)
Potassium: 3.8 mEq/L (ref 3.5–5.1)
Sodium: 137 mEq/L (ref 135–145)

## 2012-01-31 MED ORDER — METHYLPREDNISOLONE SODIUM SUCC 125 MG IJ SOLR
125.0000 mg | Freq: Once | INTRAMUSCULAR | Status: AC
  Administered 2012-01-31: 125 mg via INTRAVENOUS
  Filled 2012-01-31: qty 2

## 2012-01-31 MED ORDER — BENZONATATE 100 MG PO CAPS: 100.0000 mg | ORAL_CAPSULE | Freq: Three times a day (TID) | ORAL | Status: AC | PRN

## 2012-01-31 MED ORDER — IPRATROPIUM BROMIDE 0.02 % IN SOLN
0.5000 mg | Freq: Once | RESPIRATORY_TRACT | Status: AC
  Administered 2012-01-31: 0.5 mg via RESPIRATORY_TRACT
  Filled 2012-01-31: qty 2.5

## 2012-01-31 MED ORDER — ALBUTEROL SULFATE (5 MG/ML) 0.5% IN NEBU
5.0000 mg | INHALATION_SOLUTION | Freq: Once | RESPIRATORY_TRACT | Status: AC
  Administered 2012-01-31: 5 mg via RESPIRATORY_TRACT
  Filled 2012-01-31: qty 1

## 2012-01-31 MED ORDER — GUAIFENESIN-CODEINE 100-10 MG/5ML PO SYRP: 5.0000 mL | ORAL_SOLUTION | Freq: Three times a day (TID) | ORAL | Status: AC | PRN

## 2012-01-31 MED ORDER — PREDNISONE 10 MG PO TABS: 20.0000 mg | ORAL_TABLET | Freq: Every day | ORAL | Status: DC
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
## 2012-01-31 NOTE — Discharge Instructions (Signed)
Chronic Obstructive Pulmonary Disease Exacerbation Chronic obstructive pulmonary disease (COPD) is a condition that limits airflow. COPD may include chronic bronchitis, pulmonary emphysema, or both. A COPD exacerbation means that your COPD has gotten worse. Without treatment, this can be a life-threatening problem. COPD exacerbation requires immediate medical care. CAUSES  COPD exacerbation can be caused by:  Exposure to smoke.   Exposure to air pollution, chemical fumes, or dust.   Respiratory infections.   Genetics, particularly alpha 1-antitrypsin deficiency.   A condition in which the body's immune system attacks itself (autoimmunity).  SYMPTOMS   Increased coughing.   Increased wheezing.   Increased shortness of breath.   Swelling due to a buildup of fluid (peripheral edema) related to heart strain.   Rapid breathing.   Chest enlargement (barrel chest).   Chest tightness.  DIAGNOSIS  There is no single test that can diagnosis COPD exacerbation. Your history, physical exam, and other tests will help your caregiver make a diagnosis. Tests may include a chest X-ray, pulmonary function tests, spirometry, basic lab tests, and an arterial blood gas test. TREATMENT  Severe problems may require a stay in the hospital. Depending on the cause of your problems, the following may be prescribed:  Antibiotic medicines.   Bronchodilators (inhaled or tablets).   Cortisone medicines (inhaled or tablets).   Supplemental oxygen therapy.   Pulmonary rehabilitation. This is a broad program that may involve exercise, nutrition counseling, breathing techniques, and further education about your condition.  It is important to use good technique with inhaled medicines. Spacer devices may be needed to help improve drug delivery. HOME CARE INSTRUCTIONS   Do not smoke. Quitting smoking is very important to prevent worsening of COPD.   Avoid exposure to all substances that irritate the airway,  especially tobacco smoke.   If prescribed, take your antibiotics as directed. Finish them even if you start to feel better.   Only take over-the-counter or prescription medicines as directed by your caregiver.   Drink enough fluids to keep your urine clear or pale yellow. This can help thin bronchial secretions.   Use a cool mist vaporizer. This makes it easier to clear your chest when you cough.   If you have a home nebulizer and oxygen, continue to use them as directed.   Maintain all necessary vaccinations to prevent infections.   Exercise regularly.   Eat a healthy diet.   Keep all follow-up appointments as directed by your caregiver.  SEEK IMMEDIATE MEDICAL CARE IF:  You have extreme shortness of breath.   You have severe chest pain or blood in your sputum.   You have a high fever, weakness, repeated vomiting, or fainting.   You feel confused.  MAKE SURE YOU:   Understand these instructions.   Will watch your condition.   Will get help right away if you are not doing well or get worse.  Document Released: 06/20/2007 Document Revised: 08/12/2011 Document Reviewed: 04/20/2011 [employer] Patient Information 2012 [employer]. 

## 2012-01-31 NOTE — ED Notes (Signed)
Pt reports cough x 2 days. States she was around people who were smoking over the weekend and typically has similar symptoms. States h/o COPD and emphysema. Denies fever or chest pain. Pt speaks full and complete sentences in NAD. Reports mild relief of cough/sob following breathing treatment. Pt medicated per order. Resting and denies further needs at this time

## 2012-01-31 NOTE — ED Notes (Addendum)
Patient states she was around someone that was smoking.  everytime she is around smoke she gets bronchitis.  She is complaining of sob and cough.  Patient complains of chest pain and left arm pain.  Will do an ekg due to her significant hx

## 2012-01-31 NOTE — ED Notes (Signed)
Pt given rx x 2, discharge and follow up instructions without further questions after speaking with MD. Denies further needs at this time. Pt ambulates to lobby in NAD

## 2012-02-14 ENCOUNTER — Emergency Department (HOSPITAL_COMMUNITY)
Admission: EM | Admit: 2012-02-14 | Discharge: 2012-02-15 | Disposition: A | Discharge status: Home / Self Care | Payer: Medicare PPO | Attending: Emergency Medicine | Admitting: Emergency Medicine

## 2012-02-14 ENCOUNTER — Encounter (HOSPITAL_COMMUNITY): Payer: Self-pay | Admitting: *Deleted

## 2012-02-14 ENCOUNTER — Emergency Department (HOSPITAL_COMMUNITY): Payer: Medicare PPO

## 2012-02-14 DIAGNOSIS — Z21 Asymptomatic human immunodeficiency virus [HIV] infection status: Secondary | ICD-10-CM | POA: Insufficient documentation

## 2012-02-14 DIAGNOSIS — M79609 Pain in unspecified limb: Secondary | ICD-10-CM | POA: Insufficient documentation

## 2012-02-14 DIAGNOSIS — I252 Old myocardial infarction: Secondary | ICD-10-CM | POA: Insufficient documentation

## 2012-02-14 DIAGNOSIS — M25512 Pain in left shoulder: Secondary | ICD-10-CM

## 2012-02-14 DIAGNOSIS — Z8673 Personal history of transient ischemic attack (TIA), and cerebral infarction without residual deficits: Secondary | ICD-10-CM | POA: Insufficient documentation

## 2012-02-14 DIAGNOSIS — W010XXA Fall on same level from slipping, tripping and stumbling without subsequent striking against object, initial encounter: Secondary | ICD-10-CM | POA: Insufficient documentation

## 2012-02-14 DIAGNOSIS — M25569 Pain in unspecified knee: Secondary | ICD-10-CM | POA: Insufficient documentation

## 2012-02-14 DIAGNOSIS — M25519 Pain in unspecified shoulder: Secondary | ICD-10-CM | POA: Insufficient documentation

## 2012-02-14 DIAGNOSIS — M25562 Pain in left knee: Secondary | ICD-10-CM

## 2012-02-14 MED ORDER — OXYCODONE-ACETAMINOPHEN 5-325 MG PO TABS
1.0000 | ORAL_TABLET | Freq: Once | ORAL | Status: AC
  Administered 2012-02-14: 1 via ORAL
  Filled 2012-02-14: qty 1

## 2012-02-14 NOTE — ED Notes (Signed)
slipped and fell Saturday while in b/r stall, c/o gradual progressive pain in L side: L shoulder & arm, and L leg/knee. 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 Uses cane. Alert, NAD, calm interactive, sitting in w/c.

## 2012-02-14 NOTE — ED Notes (Signed)
Patient transported to X-ray 

## 2012-02-15 MED ORDER — OXYCODONE-ACETAMINOPHEN 5-325 MG PO TABS: 1.0000 | ORAL_TABLET | ORAL | Status: AC | PRN

## 2012-02-15 NOTE — Discharge Instructions (Signed)
Arthralgia Your caregiver has diagnosed you as suffering from an arthralgia. Arthralgia means there is pain in a joint. This can come from many reasons including:  Bruising the joint which causes soreness (inflammation) in the joint.   Wear and tear on the joints which occur as we grow older (osteoarthritis).   Overusing the joint.   Various forms of arthritis.   Infections of the joint.  Regardless of the cause of pain in your joint, most of these different pains respond to anti-inflammatory drugs and rest. The exception to this is when a joint is infected, and these cases are treated with antibiotics, if it is a bacterial infection. HOME CARE INSTRUCTIONS   Rest the injured area for as long as directed by your caregiver. Then slowly start using the joint as directed by your caregiver and as the pain allows. Crutches as directed may be useful if the ankles, knees or hips are involved. If the knee was splinted or casted, continue use and care as directed. If an stretchy or elastic wrapping bandage has been applied today, it should be removed and re-applied every 3 to 4 hours. It should not be applied tightly, but firmly enough to keep swelling down. Watch toes and feet for swelling, bluish discoloration, coldness, numbness or excessive pain. If any of these problems (symptoms) occur, remove the ace bandage and re-apply more loosely. If these symptoms persist, contact your caregiver or return to this location.   For the first 24 hours, keep the injured extremity elevated on pillows while lying down.   Apply ice for 15 to 20 minutes to the sore joint every couple hours while awake for the first half day. Then 3 to 4 times per day for the first 48 hours. Put the ice in a plastic bag and place a towel between the bag of ice and your skin.   Wear any splinting, casting, elastic bandage applications, or slings as instructed.   Only take over-the-counter or prescription medicines for pain,  discomfort, or fever as directed by your caregiver. Do not use aspirin immediately after the injury unless instructed by your physician. Aspirin can cause increased bleeding and bruising of the tissues.   If you were given crutches, continue to use them as instructed and do not resume weight bearing on the sore joint until instructed.  Persistent pain and inability to use the sore joint as directed for more than 2 to 3 days are warning signs indicating that you should see a caregiver for a follow-up visit as soon as possible. Initially, a hairline fracture (break in bone) may not be evident on X-rays. Persistent pain and swelling indicate that further evaluation, non-weight bearing or use of the joint (use of crutches or slings as instructed), or further X-rays are indicated. X-rays may sometimes not show a small fracture until a week or 10 days later. Make a follow-up appointment with your own caregiver or one to whom we have referred you. A radiologist (specialist in reading X-rays) may read your X-rays. Make sure you know how you are to obtain your X-ray results. Do not assume everything is normal if you do not hear from us. SEEK MEDICAL CARE IF: Bruising, swelling, or pain increases. SEEK IMMEDIATE MEDICAL CARE IF:   Your fingers or toes are numb or blue.   The pain is not responding to medications and continues to stay the same or get worse.   The pain in your joint becomes severe.   You develop a fever over   102 F (38.9 C).   It becomes impossible to move or use the joint.  MAKE SURE YOU:   Understand these instructions.   Will watch your condition.   Will get help right away if you are not doing well or get worse.  Document Released: 08/23/2005 Document Revised: 08/12/2011 Document Reviewed: 04/10/2008 [employer] Patient Information 2012 [employer].Knee Pain The knee is the complex joint between your thigh and your lower leg. It is made up of bones, tendons, ligaments, and  cartilage. The bones that make up the knee are:  The femur in the thigh.   The tibia and fibula in the lower leg.   The patella or kneecap riding in the groove on the lower femur.  CAUSES  Knee pain is a common complaint with many causes. A few of these causes are:  Injury, such as:   A ruptured ligament or tendon injury.   Torn cartilage.   Medical conditions, such as:   Gout   Arthritis   Infections   Overuse, over training or overdoing a physical activity.  Knee pain can be minor or severe. Knee pain can accompany debilitating injury. Minor knee problems often respond well to self-care measures or get well on their own. More serious injuries may need medical intervention or even surgery. SYMPTOMS The knee is complex. Symptoms of knee problems can vary widely. Some of the problems are:  Pain with movement and weight bearing.   Swelling and tenderness.   Buckling of the knee.   Inability to straighten or extend your knee.   Your knee locks and you cannot straighten it.   Warmth and redness with pain and fever.   Deformity or dislocation of the kneecap.  DIAGNOSIS  Determining what is wrong may be very straight forward such as when there is an injury. It can also be challenging because of the complexity of the knee. Tests to make a diagnosis may include:  Your caregiver taking a history and doing a physical exam.   Routine X-rays can be used to rule out other problems. X-rays will not reveal a cartilage tear. Some injuries of the knee can be diagnosed by:   Arthroscopy a surgical technique by which a small video camera is inserted through tiny incisions on the sides of the knee. This procedure is used to examine and repair internal knee joint problems. Tiny instruments can be used during arthroscopy to repair the torn knee cartilage (meniscus).   Arthrography is a radiology technique. A contrast liquid is directly injected into the knee joint. Internal structures of  the knee joint then become visible on X-ray film.   An MRI scan is a non x-ray radiology procedure in which magnetic fields and a computer produce two- or three-dimensional images of the inside of the knee. Cartilage tears are often visible using an MRI scanner. MRI scans have largely replaced arthrography in diagnosing cartilage tears of the knee.   Blood work.   Examination of the fluid that helps to lubricate the knee joint (synovial fluid). This is done by taking a sample out using a needle and a syringe.  TREATMENT The treatment of knee problems depends on the cause. Some of these treatments are:  Depending on the injury, proper casting, splinting, surgery or physical therapy care will be needed.   Give yourself adequate recovery time. Do not overuse your joints. If you begin to get sore during workout routines, back off. Slow down or do fewer repetitions.   For repetitive activities   such as cycling or running, maintain your strength and nutrition.   Alternate muscle groups. For example if you are a weight lifter, work the upper body on one day and the lower body the next.   Either tight or weak muscles do not give the proper support for your knee. Tight or weak muscles do not absorb the stress placed on the knee joint. Keep the muscles surrounding the knee strong.   Take care of mechanical problems.   If you have flat feet, orthotics or special shoes may help. See your caregiver if you need help.   Arch supports, sometimes with wedges on the inner or outer aspect of the heel, can help. These can shift pressure away from the side of the knee most bothered by osteoarthritis.   A brace called an "unloader" brace also may be used to help ease the pressure on the most arthritic side of the knee.   If your caregiver has prescribed crutches, braces, wraps or ice, use as directed. The acronym for this is PRICE. This means protection, rest, ice, compression and elevation.   Nonsteroidal  anti-inflammatory drugs (NSAID's), can help relieve pain. But if taken immediately after an injury, they may actually increase swelling. Take NSAID's with food in your stomach. Stop them if you develop stomach problems. Do not take these if you have a history of ulcers, stomach pain or bleeding from the bowel. Do not take without your caregiver's approval if you have problems with fluid retention, heart failure, or kidney problems.   For ongoing knee problems, physical therapy may be helpful.   Glucosamine and chondroitin are over-the-counter dietary supplements. Both may help relieve the pain of osteoarthritis in the knee. These medicines are different from the usual anti-inflammatory drugs. Glucosamine may decrease the rate of cartilage destruction.   Injections of a corticosteroid drug into your knee joint may help reduce the symptoms of an arthritis flare-up. They may provide pain relief that lasts a few months. You may have to wait a few months between injections. The injections do have a small increased risk of infection, water retention and elevated blood sugar levels.   Hyaluronic acid injected into damaged joints may ease pain and provide lubrication. These injections may work by reducing inflammation. A series of shots may give relief for as long as 6 months.   Topical painkillers. Applying certain ointments to your skin may help relieve the pain and stiffness of osteoarthritis. Ask your pharmacist for suggestions. Many over the-counter products are approved for temporary relief of arthritis pain.   In some countries, doctors often prescribe topical NSAID's for relief of chronic conditions such as arthritis and tendinitis. A review of treatment with NSAID creams found that they worked as well as oral medications but without the serious side effects.  PREVENTION  Maintain a healthy weight. Extra pounds put more strain on your joints.   Get strong, stay limber. Weak muscles are a common  cause of knee injuries. Stretching is important. Include flexibility exercises in your workouts.   Be smart about exercise. If you have osteoarthritis, chronic knee pain or recurring injuries, you may need to change the way you exercise. This does not mean you have to stop being active. If your knees ache after jogging or playing basketball, consider switching to swimming, water aerobics or other low-impact activities, at least for a few days a week. Sometimes limiting high-impact activities will provide relief.   Make sure your shoes fit well. Choose footwear that is right   for your sport.   Protect your knees. Use the proper gear for knee-sensitive activities. Use kneepads when playing volleyball or laying carpet. Buckle your seat belt every time you drive. Most shattered kneecaps occur in car accidents.   Rest when you are tired.  SEEK MEDICAL CARE IF:  You have knee pain that is continual and does not seem to be getting better.  SEEK IMMEDIATE MEDICAL CARE IF:  Your knee joint feels hot to the touch and you have a high fever. MAKE SURE YOU:   Understand these instructions.   Will watch your condition.   Will get help right away if you are not doing well or get worse.  Document Released: 06/20/2007 Document Revised: 08/12/2011 Document Reviewed: 06/20/2007 [facility] Patient Information 2012 [place], [place].Shoulder Pain The shoulder is a ball and socket joint. The muscles and tendons (rotator cuff) are what keep the shoulder in its joint and stable. This collection of muscles and tendons holds in the head (ball) of the humerus (upper arm bone) in the fossa (cup) of the scapula (shoulder blade). Today no reason was found for your shoulder pain. Often pain in the shoulder may be treated conservatively with temporary immobilization. For example, holding the shoulder in one place using a sling for rest. Physical therapy may be needed if problems continue. HOME CARE INSTRUCTIONS   Apply ice to  the sore area for 15 to 20 minutes, 3 to 4 times per day for the first 2 days. Put the ice in a plastic bag. Place a towel between the bag of ice and your skin.   If you have or were given a shoulder sling and straps, do not remove for as long as directed by your caregiver or until you see a caregiver for a follow-up examination. If you need to remove it to shower or bathe, move your arm as little as possible.   Sleep on several pillows at night to lessen swelling and pain.   Only take over-the-counter or prescription medicines for pain, discomfort, or fever as directed by your caregiver.   Keep any follow-up appointments in order to avoid any type of permanent shoulder disability or chronic pain problems.  SEEK MEDICAL CARE IF:   Pain in your shoulder increases or new pain develops in your arm, hand, or fingers.   Your hand or fingers are colder than your other hand.   You do not obtain pain relief with the medications or your pain becomes worse.  SEEK IMMEDIATE MEDICAL CARE IF:   Your arm, hand, or fingers are numb or tingling.   Your arm, hand, or fingers are swollen, painful, or turn white or blue.   You develop chest pain or shortness of breath.  MAKE SURE YOU:   Understand these instructions.   Will watch your condition.   Will get help right away if you are not doing well or get worse.  Document Released: 06/02/2005 Document Revised: 08/12/2011 Document Reviewed: 08/07/2011 [facility] Patient Information 2012 [place], [place].
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
## 2012-02-15 NOTE — ED Provider Notes (Signed)
Medical screening examination/treatment/procedure(s) were performed by non-physician practitioner and as supervising physician I was immediately available for consultation/collaboration.   Dione Booze, MD 02/15/12 [phone]

## 2012-03-07 ENCOUNTER — Ambulatory Visit: Payer: Medicare PPO

## 2012-03-07 ENCOUNTER — Ambulatory Visit: Payer: Medicare PPO | Admitting: Physical Therapy

## 2012-03-07 ENCOUNTER — Ambulatory Visit: Payer: Medicare PPO | Attending: Neurology | Admitting: Occupational Therapy

## 2012-03-07 DIAGNOSIS — I69928 Other speech and language deficits following unspecified cerebrovascular disease: Secondary | ICD-10-CM | POA: Insufficient documentation

## 2012-03-07 DIAGNOSIS — Z5189 Encounter for other specified aftercare: Secondary | ICD-10-CM | POA: Insufficient documentation

## 2012-03-07 DIAGNOSIS — I69998 Other sequelae following unspecified cerebrovascular disease: Secondary | ICD-10-CM | POA: Insufficient documentation

## 2012-03-07 DIAGNOSIS — I69922 Dysarthria following unspecified cerebrovascular disease: Secondary | ICD-10-CM | POA: Insufficient documentation

## 2012-03-07 DIAGNOSIS — M25519 Pain in unspecified shoulder: Secondary | ICD-10-CM | POA: Insufficient documentation

## 2012-03-07 DIAGNOSIS — R279 Unspecified lack of coordination: Secondary | ICD-10-CM | POA: Insufficient documentation

## 2012-03-07 DIAGNOSIS — R269 Unspecified abnormalities of gait and mobility: Secondary | ICD-10-CM | POA: Insufficient documentation

## 2012-03-07 DIAGNOSIS — M6281 Muscle weakness (generalized): Secondary | ICD-10-CM | POA: Insufficient documentation

## 2012-03-11 ENCOUNTER — Emergency Department (HOSPITAL_COMMUNITY)
Admission: EM | Admit: 2012-03-11 | Discharge: 2012-03-11 | Disposition: A | Discharge status: Home / Self Care | Payer: Medicare PPO | Attending: Emergency Medicine | Admitting: Emergency Medicine

## 2012-03-11 ENCOUNTER — Encounter (HOSPITAL_COMMUNITY): Payer: Self-pay | Admitting: Emergency Medicine

## 2012-03-11 ENCOUNTER — Emergency Department (HOSPITAL_COMMUNITY): Payer: Medicare PPO

## 2012-03-11 DIAGNOSIS — R1012 Left upper quadrant pain: Secondary | ICD-10-CM | POA: Insufficient documentation

## 2012-03-11 DIAGNOSIS — R110 Nausea: Secondary | ICD-10-CM | POA: Insufficient documentation

## 2012-03-11 DIAGNOSIS — Z79899 Other long term (current) drug therapy: Secondary | ICD-10-CM | POA: Insufficient documentation

## 2012-03-11 DIAGNOSIS — R109 Unspecified abdominal pain: Secondary | ICD-10-CM

## 2012-03-11 DIAGNOSIS — K921 Melena: Secondary | ICD-10-CM | POA: Insufficient documentation

## 2012-03-11 DIAGNOSIS — I252 Old myocardial infarction: Secondary | ICD-10-CM | POA: Insufficient documentation

## 2012-03-11 DIAGNOSIS — E7800 Pure hypercholesterolemia, unspecified: Secondary | ICD-10-CM | POA: Insufficient documentation

## 2012-03-11 DIAGNOSIS — Z8673 Personal history of transient ischemic attack (TIA), and cerebral infarction without residual deficits: Secondary | ICD-10-CM | POA: Insufficient documentation

## 2012-03-11 DIAGNOSIS — Z21 Asymptomatic human immunodeficiency virus [HIV] infection status: Secondary | ICD-10-CM | POA: Insufficient documentation

## 2012-03-11 DIAGNOSIS — K625 Hemorrhage of anus and rectum: Secondary | ICD-10-CM | POA: Insufficient documentation

## 2012-03-11 LAB — URINALYSIS, ROUTINE W REFLEX MICROSCOPIC
Hgb urine dipstick: NEGATIVE
Nitrite: NEGATIVE
Protein, ur: NEGATIVE mg/dL
Urobilinogen, UA: 0.2 mg/dL (ref 0.0–1.0)

## 2012-03-11 LAB — CBC
MCHC: 32.3 g/dL (ref 30.0–36.0)
Platelets: 256 10*3/uL (ref 150–400)
RDW: 16 % — ABNORMAL HIGH (ref 11.5–15.5)
WBC: 16.7 10*3/uL — ABNORMAL HIGH (ref 4.0–10.5)

## 2012-03-11 LAB — BASIC METABOLIC PANEL
BUN: 21 mg/dL (ref 6–23)
Calcium: 8.8 mg/dL (ref 8.4–10.5)
Creatinine, Ser: 0.75 mg/dL (ref 0.50–1.10)
GFR calc Af Amer: >90 mL/min (ref >90)
GFR calc non Af Amer: >90 mL/min (ref >90)

## 2012-03-11 LAB — HEPATIC FUNCTION PANEL
Albumin: 3.1 g/dL — ABNORMAL LOW (ref 3.5–5.2)
Alkaline Phosphatase: 42 U/L (ref 39–117)
Bilirubin, Direct: <0.1 mg/dL (ref 0.0–0.3)
Total Bilirubin: 0.2 mg/dL — ABNORMAL LOW (ref 0.3–1.2)

## 2012-03-11 LAB — URINE MICROSCOPIC-ADD ON

## 2012-03-11 LAB — LIPASE, BLOOD: Lipase: 38 U/L (ref 11–59)

## 2012-03-11 MED ORDER — HYDROMORPHONE HCL PF 1 MG/ML IJ SOLN
1.0000 mg | Freq: Once | INTRAMUSCULAR | Status: AC
  Administered 2012-03-11: 1 mg via INTRAVENOUS
  Filled 2012-03-11: qty 1

## 2012-03-11 MED ORDER — DIPHENOXYLATE-ATROPINE 2.5-0.025 MG PO TABS: 1.0000 | ORAL_TABLET | Freq: Four times a day (QID) | ORAL | Status: DC | PRN

## 2012-03-11 MED ORDER — SODIUM CHLORIDE 0.9 % IV BOLUS (SEPSIS)
1000.0000 mL | INTRAVENOUS | Status: AC
  Administered 2012-03-11: 1000 mL via INTRAVENOUS

## 2012-03-11 MED ORDER — IOHEXOL 300 MG/ML  SOLN
20.0000 mL | INTRAMUSCULAR | Status: AC
  Administered 2012-03-11: 20 mL via ORAL

## 2012-03-11 MED ORDER — ONDANSETRON HCL 4 MG/2ML IJ SOLN
4.0000 mg | Freq: Once | INTRAMUSCULAR | Status: AC
  Administered 2012-03-11: 4 mg via INTRAVENOUS
  Filled 2012-03-11: qty 2

## 2012-03-11 MED ORDER — IOHEXOL 300 MG/ML  SOLN
125.0000 mL | Freq: Once | INTRAMUSCULAR | Status: AC | PRN
  Administered 2012-03-11: 125 mL via INTRAVENOUS

## 2012-03-11 NOTE — ED Notes (Signed)
C/o rectal bleeding since Monday night. Reports LUQ pain since Friday morning.

## 2012-03-11 NOTE — ED Notes (Signed)
Pt states she noticed dark brown blood in stool on Monday.  Increasing left sided abdominal pain since then.  States blood has progressively become bright red.  Denies n/v.  Some abdominal tenderness on the left side.
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
## 2012-03-20 ENCOUNTER — Emergency Department (HOSPITAL_COMMUNITY): Payer: Medicare PPO

## 2012-03-20 ENCOUNTER — Encounter (HOSPITAL_COMMUNITY): Payer: Self-pay | Admitting: Emergency Medicine

## 2012-03-20 ENCOUNTER — Emergency Department (HOSPITAL_COMMUNITY)
Admission: EM | Admit: 2012-03-20 | Discharge: 2012-03-21 | Disposition: A | Discharge status: Home / Self Care | Payer: Medicare PPO | Attending: Emergency Medicine | Admitting: Emergency Medicine

## 2012-03-20 DIAGNOSIS — M7989 Other specified soft tissue disorders: Secondary | ICD-10-CM | POA: Insufficient documentation

## 2012-03-20 DIAGNOSIS — R0602 Shortness of breath: Secondary | ICD-10-CM | POA: Insufficient documentation

## 2012-03-20 DIAGNOSIS — B20 Human immunodeficiency virus [HIV] disease: Secondary | ICD-10-CM | POA: Insufficient documentation

## 2012-03-20 DIAGNOSIS — L738 Other specified follicular disorders: Secondary | ICD-10-CM | POA: Insufficient documentation

## 2012-03-20 DIAGNOSIS — D259 Leiomyoma of uterus, unspecified: Secondary | ICD-10-CM | POA: Insufficient documentation

## 2012-03-20 DIAGNOSIS — L739 Follicular disorder, unspecified: Secondary | ICD-10-CM

## 2012-03-20 DIAGNOSIS — Z79899 Other long term (current) drug therapy: Secondary | ICD-10-CM | POA: Insufficient documentation

## 2012-03-20 LAB — BASIC METABOLIC PANEL
BUN: 17 mg/dL (ref 6–23)
Calcium: 8.6 mg/dL (ref 8.4–10.5)
Creatinine, Ser: 0.83 mg/dL (ref 0.50–1.10)
GFR calc Af Amer: >90 mL/min (ref >90)
GFR calc non Af Amer: 81 mL/min — ABNORMAL LOW (ref >90)
GFR calc non Af Amer: >90 mL/min (ref >90)
Glucose, Bld: 101 mg/dL — ABNORMAL HIGH (ref 70–99)
Sodium: 138 mEq/L (ref 135–145)

## 2012-03-20 LAB — CBC WITH DIFFERENTIAL/PLATELET
Basophils Absolute: 0 10*3/uL (ref 0.0–0.1)
Basophils Relative: 0 % (ref 0–1)
Eosinophils Absolute: 0 10*3/uL (ref 0.0–0.7)
Eosinophils Absolute: 0 10*3/uL (ref 0.0–0.7)
Eosinophils Relative: 0 % (ref 0–5)
HCT: 32.5 % — ABNORMAL LOW (ref 36.0–46.0)
Hemoglobin: 10.4 g/dL — ABNORMAL LOW (ref 12.0–15.0)
MCH: 26.1 pg (ref 26.0–34.0)
MCH: 25.9 pg — ABNORMAL LOW (ref 26.0–34.0)
MCHC: 32 g/dL (ref 30.0–36.0)
MCHC: 32.1 g/dL (ref 30.0–36.0)
MCV: 80.5 fL (ref 78.0–100.0)
Monocytes Absolute: 0.8 10*3/uL (ref 0.1–1.0)
Monocytes Relative: 5 % (ref 3–12)
Neutrophils Relative %: 86 % — ABNORMAL HIGH (ref 43–77)
Platelets: 152 10*3/uL (ref 150–400)
RDW: 17 % — ABNORMAL HIGH (ref 11.5–15.5)

## 2012-03-20 LAB — URINALYSIS, ROUTINE W REFLEX MICROSCOPIC
Bilirubin Urine: NEGATIVE
Protein, ur: NEGATIVE mg/dL
Urobilinogen, UA: 0.2 mg/dL (ref 0.0–1.0)

## 2012-03-20 LAB — LIPASE, BLOOD: Lipase: 40 U/L (ref 11–59)

## 2012-03-20 LAB — HEPATIC FUNCTION PANEL
ALT: 23 U/L (ref 0–35)
Bilirubin, Direct: <0.1 mg/dL (ref 0.0–0.3)
Total Bilirubin: 0.3 mg/dL (ref 0.3–1.2)

## 2012-03-20 LAB — URINE MICROSCOPIC-ADD ON

## 2012-03-20 LAB — TROPONIN I: Troponin I: <0.3 ng/mL (ref <0.30)

## 2012-03-20 MED ORDER — HYDROCODONE-ACETAMINOPHEN 5-325 MG PO TABS: 2.0000 | ORAL_TABLET | ORAL | Status: AC | PRN

## 2012-03-20 MED ORDER — HYDROCODONE-ACETAMINOPHEN 5-325 MG PO TABS
2.0000 | ORAL_TABLET | Freq: Once | ORAL | Status: AC
  Administered 2012-03-20: 2 via ORAL
  Filled 2012-03-20: qty 2

## 2012-03-20 MED ORDER — ONDANSETRON HCL 4 MG/2ML IJ SOLN
4.0000 mg | Freq: Once | INTRAMUSCULAR | Status: AC
  Administered 2012-03-20: 4 mg via INTRAVENOUS
  Filled 2012-03-20: qty 2

## 2012-03-20 MED ORDER — GI COCKTAIL ~~LOC~~
30.0000 mL | Freq: Once | ORAL | Status: AC
  Administered 2012-03-20: 30 mL via ORAL
  Filled 2012-03-20: qty 30

## 2012-03-20 MED ORDER — HYDROMORPHONE HCL PF 1 MG/ML IJ SOLN
1.0000 mg | Freq: Once | INTRAMUSCULAR | Status: AC
  Administered 2012-03-20: 1 mg via INTRAVENOUS
  Filled 2012-03-20: qty 1

## 2012-03-20 MED ORDER — CEPHALEXIN 500 MG PO CAPS: 500.0000 mg | ORAL_CAPSULE | Freq: Four times a day (QID) | ORAL | Status: AC

## 2012-03-20 MED ORDER — FLUCONAZOLE 200 MG PO TABS: 200.0000 mg | ORAL_TABLET | Freq: Every day | ORAL | Status: AC

## 2012-03-20 MED ORDER — SULFAMETHOXAZOLE-TMP DS 800-160 MG PO TABS
2.0000 | ORAL_TABLET | Freq: Once | ORAL | Status: AC
  Administered 2012-03-20: 2 via ORAL
  Filled 2012-03-20: qty 2

## 2012-03-20 MED ORDER — CEPHALEXIN 250 MG PO CAPS
500.0000 mg | ORAL_CAPSULE | Freq: Once | ORAL | Status: AC
  Administered 2012-03-20: 500 mg via ORAL
  Filled 2012-03-20: qty 2

## 2012-03-20 MED ORDER — SULFAMETHOXAZOLE-TRIMETHOPRIM 800-160 MG PO TABS: 2.0000 | ORAL_TABLET | Freq: Two times a day (BID) | ORAL | Status: AC

## 2012-03-20 MED ORDER — DIPHENHYDRAMINE HCL 25 MG PO TABS: 25.0000 mg | ORAL_TABLET | Freq: Four times a day (QID) | ORAL | Status: DC

## 2012-03-20 NOTE — ED Notes (Signed)
Pt c/o bilateral LE swelling x 4 days; pt sts increased SOB; pt sts some rash noted to legs and face

## 2012-03-20 NOTE — ED Notes (Signed)
Pt states her legs began swelling Saturday am. Both feet have small reddened areas with yellow centers. Pt states both legs are very painful. Pt also states "Oh and my fibroid tumor is acting up and I'm bleeding and I haven't had my menstrual in 3 years. This happened in august and they gave me some depo shot." NAD noted. Lungs clear to auscultation. Denies CP, n/v, SOB.

## 2012-03-20 NOTE — ED Notes (Signed)
Pt transported to [facility]
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
## 2012-03-21 LAB — URINE CULTURE

## 2012-03-21 MED ORDER — DIPHENHYDRAMINE HCL 25 MG PO CAPS
ORAL_CAPSULE | ORAL | Status: AC
  Filled 2012-03-21: qty 2

## 2012-03-21 MED ORDER — DIPHENHYDRAMINE HCL 25 MG PO CAPS
50.0000 mg | ORAL_CAPSULE | Freq: Once | ORAL | Status: AC
  Administered 2012-03-21: 50 mg via ORAL

## 2012-03-21 NOTE — ED Notes (Signed)
Received report from [employer]. Pt came to the ED because she has been having bilateral leg swelling. No pitting. Pulses are palpable and WNL. No redness and warm to touch. On the feet there are red bumps with yellow centers. Pt also complaining of bleeding after urinating this AM. States that this occurs intermittently because of fibroids. Intermittent stomach lower stomach cramping. No n/v/d. No cardiac or respiratory distress. Will continue to monitor.

## 2012-03-21 NOTE — ED Provider Notes (Signed)
Care assumed from Dr. Vivia Ewing at shift change.  I agree with his note, assessment, and plan.  The patient is awaiting ultrasound.  This was performed and showed only fibroids.  She will be discharged to home, to return prn.    Geoffery Lyons, MD 03/21/12 0110

## 2012-03-22 ENCOUNTER — Other Ambulatory Visit: Payer: Self-pay | Admitting: Internal Medicine

## 2012-03-22 ENCOUNTER — Ambulatory Visit: Payer: Medicare PPO | Admitting: Physical Therapy

## 2012-03-22 ENCOUNTER — Ambulatory Visit: Payer: Medicare PPO | Admitting: Speech Pathology

## 2012-03-22 ENCOUNTER — Ambulatory Visit: Payer: Medicare PPO | Admitting: *Deleted

## 2012-03-22 DIAGNOSIS — F4321 Adjustment disorder with depressed mood: Secondary | ICD-10-CM

## 2012-03-22 DIAGNOSIS — D249 Benign neoplasm of unspecified breast: Secondary | ICD-10-CM

## 2012-03-22 DIAGNOSIS — I6350 Cerebral infarction due to unspecified occlusion or stenosis of unspecified cerebral artery: Secondary | ICD-10-CM

## 2012-03-23 ENCOUNTER — Ambulatory Visit: Payer: Medicare PPO | Admitting: *Deleted

## 2012-03-23 ENCOUNTER — Ambulatory Visit: Payer: Medicare PPO | Admitting: Physical Therapy

## 2012-03-27 ENCOUNTER — Inpatient Hospital Stay: Admission: RE | Admit: 2012-03-27 | Payer: Medicare PPO | Source: Ambulatory Visit

## 2012-03-27 ENCOUNTER — Other Ambulatory Visit: Payer: Medicare PPO

## 2012-03-28 ENCOUNTER — Encounter: Payer: Medicare PPO | Admitting: Occupational Therapy

## 2012-03-28 ENCOUNTER — Ambulatory Visit: Payer: Medicare PPO | Admitting: Physical Therapy

## 2012-03-28 ENCOUNTER — Encounter (INDEPENDENT_AMBULATORY_CARE_PROVIDER_SITE_OTHER): Payer: Medicare PPO

## 2012-03-28 DIAGNOSIS — I6350 Cerebral infarction due to unspecified occlusion or stenosis of unspecified cerebral artery: Secondary | ICD-10-CM

## 2012-03-29 ENCOUNTER — Ambulatory Visit: Payer: Medicare PPO

## 2012-03-29 ENCOUNTER — Ambulatory Visit: Payer: Medicare PPO | Admitting: Physical Therapy

## 2012-03-29 ENCOUNTER — Ambulatory Visit: Payer: Medicare PPO | Admitting: Occupational Therapy

## 2012-03-30 ENCOUNTER — Telehealth: Payer: Self-pay

## 2012-03-30 NOTE — Telephone Encounter (Signed)
Pt was previous patient.  Ok for labs and office visit only. Laurell Josephs, RN

## 2012-03-31 ENCOUNTER — Ambulatory Visit: Payer: Medicare PPO | Admitting: Occupational Therapy

## 2012-03-31 ENCOUNTER — Ambulatory Visit: Payer: Medicare PPO | Admitting: Physical Therapy

## 2012-03-31 ENCOUNTER — Ambulatory Visit: Payer: Medicare PPO

## 2012-04-03 ENCOUNTER — Ambulatory Visit: Payer: Medicare PPO | Admitting: Speech Pathology

## 2012-04-03 ENCOUNTER — Ambulatory Visit: Payer: Medicare PPO | Admitting: Physical Therapy

## 2012-04-03 ENCOUNTER — Ambulatory Visit: Payer: Medicare PPO | Admitting: Occupational Therapy

## 2012-04-06 ENCOUNTER — Ambulatory Visit: Payer: Medicare PPO | Admitting: Physical Therapy

## 2012-04-06 ENCOUNTER — Encounter: Payer: Medicare PPO | Admitting: Occupational Therapy

## 2012-04-11 ENCOUNTER — Other Ambulatory Visit (INDEPENDENT_AMBULATORY_CARE_PROVIDER_SITE_OTHER): Payer: Medicare PPO

## 2012-04-11 ENCOUNTER — Ambulatory Visit: Payer: Medicare PPO

## 2012-04-11 ENCOUNTER — Encounter: Payer: Self-pay | Admitting: *Deleted

## 2012-04-11 ENCOUNTER — Ambulatory Visit: Payer: Medicare PPO | Admitting: Physical Therapy

## 2012-04-11 ENCOUNTER — Ambulatory Visit: Payer: Medicare PPO | Attending: Neurology | Admitting: Occupational Therapy

## 2012-04-11 DIAGNOSIS — Z7721 Contact with and (suspected) exposure to potentially hazardous body fluids: Secondary | ICD-10-CM

## 2012-04-11 DIAGNOSIS — I69922 Dysarthria following unspecified cerebrovascular disease: Secondary | ICD-10-CM | POA: Insufficient documentation

## 2012-04-11 DIAGNOSIS — M6281 Muscle weakness (generalized): Secondary | ICD-10-CM | POA: Insufficient documentation

## 2012-04-11 DIAGNOSIS — R269 Unspecified abnormalities of gait and mobility: Secondary | ICD-10-CM | POA: Insufficient documentation

## 2012-04-11 DIAGNOSIS — Z5189 Encounter for other specified aftercare: Secondary | ICD-10-CM | POA: Insufficient documentation

## 2012-04-11 DIAGNOSIS — M25519 Pain in unspecified shoulder: Secondary | ICD-10-CM | POA: Insufficient documentation

## 2012-04-11 DIAGNOSIS — Z79899 Other long term (current) drug therapy: Secondary | ICD-10-CM

## 2012-04-11 DIAGNOSIS — I69928 Other speech and language deficits following unspecified cerebrovascular disease: Secondary | ICD-10-CM | POA: Insufficient documentation

## 2012-04-11 DIAGNOSIS — R279 Unspecified lack of coordination: Secondary | ICD-10-CM | POA: Insufficient documentation

## 2012-04-11 DIAGNOSIS — I69998 Other sequelae following unspecified cerebrovascular disease: Secondary | ICD-10-CM | POA: Insufficient documentation

## 2012-04-11 DIAGNOSIS — B20 Human immunodeficiency virus [HIV] disease: Secondary | ICD-10-CM

## 2012-04-11 LAB — CBC
HCT: 32.7 % — ABNORMAL LOW (ref 36.0–46.0)
Hemoglobin: 10.6 g/dL — ABNORMAL LOW (ref 12.0–15.0)
MCH: 26.2 pg (ref 26.0–34.0)
MCHC: 32.4 g/dL (ref 30.0–36.0)
MCV: 80.7 fL (ref 78.0–100.0)

## 2012-04-11 LAB — LIPID PANEL
HDL: 53 mg/dL (ref >39)
Total CHOL/HDL Ratio: 3.7 Ratio
Triglycerides: 137 mg/dL (ref <150)

## 2012-04-12 ENCOUNTER — Ambulatory Visit (INDEPENDENT_AMBULATORY_CARE_PROVIDER_SITE_OTHER): Payer: Medicare PPO | Admitting: Obstetrics and Gynecology

## 2012-04-12 ENCOUNTER — Encounter: Payer: Self-pay | Admitting: Obstetrics and Gynecology

## 2012-04-12 VITALS — BP 110/66 | Resp 16 | Ht 62.0 in | Wt 260.0 lb

## 2012-04-12 DIAGNOSIS — Z124 Encounter for screening for malignant neoplasm of cervix: Secondary | ICD-10-CM

## 2012-04-12 LAB — RPR

## 2012-04-12 LAB — HIV-1 RNA QUANT-NO REFLEX-BLD: HIV 1 RNA Quant: <20 copies/mL (ref <20)

## 2012-04-12 MED ORDER — HYDROCODONE-ACETAMINOPHEN 5-500 MG PO TABS: 1.0000 | ORAL_TABLET | Freq: Four times a day (QID) | ORAL | Status: AC | PRN

## 2012-04-12 NOTE — Progress Notes (Signed)
HIV + x 1yrs (undetectable VL) H/o fibroid with menorrhagia s/p Depo lupron last yr S/p ER visit with ultrasound  Uterus: Retroverted and retroflexed, with lobulated contour and  suboptimal visualization. Possible lower uterine segment fibroid  measuring 2.5 x 2.1 x 1.9 cm.  Endometrium: Best seen transabdominally but not well visualized. 5  mm and uniformly echogenic where visualized.  Right ovary: Not visualized. No adnexal mass.  Left ovary: Not visualized. No adnexal mass.  Other findings: No free fluid  IMPRESSION:  Suboptimal visualization due to patient body habitus and uterine  orientation. Possible lower uterine segment fibroid again noted.  No acute abnormality.  H/o 3.2cm cervical fibroid  C/o no bleeding x 58yrs and then all of the sudden there was a return of the bleeding (heavy)  Filed Vitals:   04/12/12 1227  BP: 110/66  Resp: 16   ROS: noncontributory  Pelvic exam:  VULVA: normal appearing vulva with no masses, tenderness or lesions,  VAGINA: normal appearing vagina with normal color and discharge, no lesions, CERVIX: normal appearing cervix without discharge or lesions, (h/o LEEP or Cryo), cervix dfficult to visualize UTERUS: uterus is normal size, shape, consistency and nontender,  ADNEXA: normal adnexa in size, nontender and no masses.  A/P Rec Em Bx PT wants to try lupron again it helps with the pain and bleeding Pap today Requests refill of Vicodin

## 2012-04-13 ENCOUNTER — Ambulatory Visit: Payer: Medicare PPO | Admitting: Physical Therapy

## 2012-04-13 ENCOUNTER — Encounter: Payer: Medicare PPO | Admitting: Occupational Therapy

## 2012-04-13 LAB — COMPLETE METABOLIC PANEL WITH GFR
Alkaline Phosphatase: 37 U/L — ABNORMAL LOW (ref 39–117)
BUN: 17 mg/dL (ref 6–23)
CO2: 30 mEq/L (ref 19–32)
Creat: 0.76 mg/dL (ref 0.50–1.10)
GFR, Est African American: >89 mL/min
GFR, Est Non African American: >89 mL/min
Glucose, Bld: 50 mg/dL — ABNORMAL LOW (ref 70–99)
Sodium: 144 mEq/L (ref 135–145)
Total Bilirubin: 0.3 mg/dL (ref 0.3–1.2)

## 2012-04-13 LAB — T-HELPER CELL (CD4) - (RCID CLINIC ONLY)
CD4 % Helper T Cell: 28 % — ABNORMAL LOW (ref 33–55)
CD4 T Cell Abs: 610 uL (ref 400–2700)

## 2012-04-14 LAB — PAP IG W/ RFLX HPV ASCU

## 2012-04-17 ENCOUNTER — Other Ambulatory Visit: Payer: Self-pay | Admitting: Obstetrics and Gynecology

## 2012-04-17 ENCOUNTER — Telehealth: Payer: Self-pay | Admitting: Obstetrics and Gynecology

## 2012-04-17 MED ORDER — LEUPROLIDE ACETATE (3 MONTH) 11.25 MG IM KIT: 11.2500 mg | PACK | INTRAMUSCULAR | Status: DC

## 2012-04-17 NOTE — Telephone Encounter (Signed)
Tc to [name] to inform that Lupron Depot will be called in to the [employer] [employer].  [name] to call back with any questions.  Will call [name] for scheduling or will await call from [name] once med is received.

## 2012-04-20 ENCOUNTER — Telehealth: Payer: Self-pay | Admitting: Obstetrics and Gynecology

## 2012-04-20 NOTE — Telephone Encounter (Signed)
Called pt who atates that she needs prior auth for Lupron Depot 11.25 mg q 3 months. I called [employer] to start prior auth process. They will fax [place] a form to fill out and fax back.

## 2012-04-20 NOTE — Telephone Encounter (Signed)
JACKIE/RX FOLLOW UP

## 2012-04-21 ENCOUNTER — Encounter: Payer: Self-pay | Admitting: Cardiovascular Disease

## 2012-04-21 ENCOUNTER — Encounter: Payer: Self-pay | Admitting: Obstetrics and Gynecology

## 2012-04-21 ENCOUNTER — Ambulatory Visit (INDEPENDENT_AMBULATORY_CARE_PROVIDER_SITE_OTHER): Payer: Medicare PPO | Admitting: Cardiovascular Disease

## 2012-04-21 VITALS — BP 120/85 | HR 89 | Resp 17 | Ht 62.0 in | Wt 260.8 lb

## 2012-04-21 DIAGNOSIS — I10 Essential (primary) hypertension: Secondary | ICD-10-CM

## 2012-04-21 DIAGNOSIS — I509 Heart failure, unspecified: Secondary | ICD-10-CM

## 2012-04-21 DIAGNOSIS — I639 Cerebral infarction, unspecified: Secondary | ICD-10-CM

## 2012-04-21 DIAGNOSIS — I2510 Atherosclerotic heart disease of native coronary artery without angina pectoris: Secondary | ICD-10-CM

## 2012-04-21 DIAGNOSIS — E785 Hyperlipidemia, unspecified: Secondary | ICD-10-CM

## 2012-04-21 DIAGNOSIS — I6350 Cerebral infarction due to unspecified occlusion or stenosis of unspecified cerebral artery: Secondary | ICD-10-CM

## 2012-04-21 DIAGNOSIS — I5042 Chronic combined systolic (congestive) and diastolic (congestive) heart failure: Secondary | ICD-10-CM | POA: Insufficient documentation

## 2012-04-21 NOTE — Assessment & Plan Note (Signed)
Stable with no angina and good activity level.  Continue medical Rx Will try to find [facility] records in [place]

## 2012-04-21 NOTE — Assessment & Plan Note (Signed)
Euvolemic. Continue current meds 

## 2012-04-21 NOTE — Assessment & Plan Note (Signed)
Cholesterol is at goal.  Continue current dose of statin and diet Rx.  No myalgias or side effects.  F/U  LFT's in 6 months. Lab Results  Component Value Date   LDLCALC 117* 04/11/2012

## 2012-04-21 NOTE — Progress Notes (Signed)
Patient ID: [name], female   DOB: 08-07-1961, 51 y.o.   MRN: [id number] 51 yo patient previously seen by Dr Donnie Aho.  History of CAD with stent at [facility] long time ago ? More recent stent at [facility] in 2008.  No recent chest pain.  In hospital 4/13 With TIA.  TEE by Dr Jens Som with small PFO no SOE.  Has been on good Rx for BP and cholesterol  Dr Blima Singer office follows primary needs.  Finished rehab and has mild  Dysarthria.  No chest pain, dyspnea palpitations.  Just had monitor from [employer] taken off  Placed by Pearlean Brownie probably to R/O occult PAF.  TEE showed  Study Conclusions  - Left ventricle: Systolic function was severely reduced. The estimated ejection fraction was in the range of 25% to 30%. Akinesis of the anteroseptal myocardium. Akinesis of the distal inferior myocardium. Akinesis of the apical myocardium. - Aortic valve: Trivial regurgitation. - Mitral valve: Mild regurgitation. - Left atrium: The atrium was mildly dilated. No evidence of thrombus in the atrial cavity or appendage. - Atrial septum: No defect or patent foramen ovale was identified.    ROS: Denies fever, malais, weight loss, blurry vision, decreased visual acuity, cough, sputum, SOB, hemoptysis, pleuritic pain, palpitaitons, heartburn, abdominal pain, melena, lower extremity edema, claudication, or rash.  All other systems reviewed and negative   General: Affect appropriate Overweith black female HEENT: normal Neck supple with no adenopathy JVP normal no bruits no thyromegaly Lungs clear with no wheezing and good diaphragmatic motion Heart:  S1/S2 no murmur,rub, gallop or click PMI normal Abdomen: benighn, BS positve, no tenderness, no AAA no bruit.  No HSM or HJR Distal pulses intact with no bruits No edema Neuro non-focal Skin warm and dry No muscular weakness  Medications Current Outpatient Prescriptions  Medication Sig Dispense Refill  . albuterol (PROVENTIL HFA;VENTOLIN HFA) 108 (90 BASE)  MCG/ACT inhaler Inhale 2 puffs into the lungs every 6 (six) hours as needed. For wheezing and shortness of breath      . carvedilol (COREG) 3.125 MG tablet Take 3.125 mg by mouth 2 (two) times daily with a meal.      . Darunavir Ethanolate (PREZISTA) 800 MG tablet Take 800 mg by mouth daily with breakfast.      . diphenoxylate-atropine (LOMOTIL) 2.5-0.025 MG per tablet Take 1 tablet by mouth 4 (four) times daily as needed. For loose stool      . emtricitabine-tenofovir (TRUVADA) 200-300 MG per tablet Take 1 tablet by mouth daily.      Marland Kitchen gabapentin (NEURONTIN) 400 MG capsule Take 400 mg by mouth 3 (three) times daily.       Marland Kitchen HYDROcodone-acetaminophen (VICODIN) 5-500 MG per tablet Take 1-2 tablets by mouth every 6 (six) hours as needed for pain.  30 tablet  0  . ibuprofen (ADVIL,MOTRIN) 200 MG tablet Take 400 mg by mouth every 6 (six) hours as needed. For pain      . isosorbide mononitrate (IMDUR) 60 MG 24 hr tablet Take 60 mg by mouth daily.      Marland Kitchen leuprolide (LUPRON DEPOT) 11.25 MG injection Inject 11.25 mg into the muscle every 3 (three) months.  1 each  0  . lisinopril (PRINIVIL,ZESTRIL) 20 MG tablet Take 20 mg by mouth daily.      . nitroGLYCERIN (NITROSTAT) 0.3 MG SL tablet Place 0.3 mg under the tongue every 5 (five) minutes as needed. For chest pain      . oxyCODONE-acetaminophen (PERCOCET) 10-325 MG per  tablet Take 1 tablet by mouth every 4 (four) hours as needed. For pain      . pantoprazole (PROTONIX) 20 MG tablet Take 2 tablets (40 mg total) by mouth daily.  60 tablet  6  . ritonavir (NORVIR) 100 MG capsule Take 100 mg by mouth 2 (two) times daily.      . rosuvastatin (CRESTOR) 5 MG tablet Take 5 mg by mouth daily at 6 PM.      . traZODone (DESYREL) 50 MG tablet Take 1 tablet (50 mg total) by mouth at bedtime.  30 tablet  0  . valACYclovir (VALTREX) 500 MG tablet Take 500 mg by mouth 2 (two) times daily as needed. For break out      . diphenhydrAMINE (BENADRYL) 25 MG tablet Take 1 tablet  (25 mg total) by mouth every 6 (six) hours.  20 tablet  0    Allergies Atorvastatin  Family History: No family history on file.  Social History: History   Social History  . Marital Status: Single    Spouse Name: N/A    Number of Children: N/A  . Years of Education: N/A   Occupational History  . Not on file.   Social History Main Topics  . Smoking status: Former Smoker    Quit date: 04/25/2010  . Smokeless tobacco: Never Used  . Alcohol Use: 1.0 oz/week    2 drink(s) per week     wine  . Drug Use: 1 per week    Special: Cocaine, Marijuana     pt. states has not done any drugs in 3 months  . Sexually Active: Yes    Birth Control/ Protection:      gave her condoms   Other Topics Concern  . Not on file   Social History Narrative  . No narrative on file    Electrocardiogram:  NSR jrate 79  PVC poor R wave progressoin 03/14/12  Assessment and Plan

## 2012-04-21 NOTE — Assessment & Plan Note (Signed)
Well controlled.  Continue current medications and low sodium Dash type diet.    

## 2012-04-21 NOTE — Assessment & Plan Note (Signed)
ASA  F/U Pearlean Brownie  Will track down monitor results when available.  She just sent it in Tuesday.  R/O PAF

## 2012-04-21 NOTE — Patient Instructions (Signed)
Your physician wants you to follow-up in:  6 MONTHS WITH DR NISHAN  You will receive a reminder letter in the mail two months in advance. If you don't receive a letter, please call our office to schedule the follow-up appointment. Your physician recommends that you continue on your current medications as directed. Please refer to the Current Medication list given to you today. 

## 2012-04-23 ENCOUNTER — Emergency Department (HOSPITAL_COMMUNITY): Payer: Medicare PPO

## 2012-04-23 ENCOUNTER — Encounter (HOSPITAL_COMMUNITY): Payer: Self-pay | Admitting: *Deleted

## 2012-04-23 ENCOUNTER — Inpatient Hospital Stay (HOSPITAL_COMMUNITY)
Admission: EM | Admit: 2012-04-23 | Discharge: 2012-04-25 | DRG: 191 | Disposition: A | Discharge status: Home / Self Care | Payer: Medicare PPO | Attending: Internal Medicine | Admitting: Internal Medicine

## 2012-04-23 DIAGNOSIS — F411 Generalized anxiety disorder: Secondary | ICD-10-CM | POA: Diagnosis present

## 2012-04-23 DIAGNOSIS — Z21 Asymptomatic human immunodeficiency virus [HIV] infection status: Secondary | ICD-10-CM | POA: Diagnosis present

## 2012-04-23 DIAGNOSIS — I10 Essential (primary) hypertension: Secondary | ICD-10-CM | POA: Diagnosis present

## 2012-04-23 DIAGNOSIS — J449 Chronic obstructive pulmonary disease, unspecified: Secondary | ICD-10-CM | POA: Diagnosis present

## 2012-04-23 DIAGNOSIS — I509 Heart failure, unspecified: Secondary | ICD-10-CM | POA: Diagnosis present

## 2012-04-23 DIAGNOSIS — N946 Dysmenorrhea, unspecified: Secondary | ICD-10-CM

## 2012-04-23 DIAGNOSIS — B20 Human immunodeficiency virus [HIV] disease: Secondary | ICD-10-CM

## 2012-04-23 DIAGNOSIS — Z9189 Other specified personal risk factors, not elsewhere classified: Secondary | ICD-10-CM

## 2012-04-23 DIAGNOSIS — M1990 Unspecified osteoarthritis, unspecified site: Secondary | ICD-10-CM

## 2012-04-23 DIAGNOSIS — I252 Old myocardial infarction: Secondary | ICD-10-CM

## 2012-04-23 DIAGNOSIS — R110 Nausea: Secondary | ICD-10-CM

## 2012-04-23 DIAGNOSIS — G4700 Insomnia, unspecified: Secondary | ICD-10-CM

## 2012-04-23 DIAGNOSIS — I5042 Chronic combined systolic (congestive) and diastolic (congestive) heart failure: Secondary | ICD-10-CM | POA: Diagnosis present

## 2012-04-23 DIAGNOSIS — Z6841 Body Mass Index (BMI) 40.0 and over, adult: Secondary | ICD-10-CM

## 2012-04-23 DIAGNOSIS — B009 Herpesviral infection, unspecified: Secondary | ICD-10-CM

## 2012-04-23 DIAGNOSIS — J209 Acute bronchitis, unspecified: Principal | ICD-10-CM

## 2012-04-23 DIAGNOSIS — F3289 Other specified depressive episodes: Secondary | ICD-10-CM | POA: Diagnosis present

## 2012-04-23 DIAGNOSIS — E785 Hyperlipidemia, unspecified: Secondary | ICD-10-CM

## 2012-04-23 DIAGNOSIS — K644 Residual hemorrhoidal skin tags: Secondary | ICD-10-CM

## 2012-04-23 DIAGNOSIS — E669 Obesity, unspecified: Secondary | ICD-10-CM | POA: Diagnosis present

## 2012-04-23 DIAGNOSIS — Z87898 Personal history of other specified conditions: Secondary | ICD-10-CM

## 2012-04-23 DIAGNOSIS — R0602 Shortness of breath: Secondary | ICD-10-CM | POA: Diagnosis present

## 2012-04-23 DIAGNOSIS — K5909 Other constipation: Secondary | ICD-10-CM | POA: Diagnosis present

## 2012-04-23 DIAGNOSIS — Z79899 Other long term (current) drug therapy: Secondary | ICD-10-CM

## 2012-04-23 DIAGNOSIS — F329 Major depressive disorder, single episode, unspecified: Secondary | ICD-10-CM

## 2012-04-23 DIAGNOSIS — Z87891 Personal history of nicotine dependence: Secondary | ICD-10-CM

## 2012-04-23 DIAGNOSIS — I5022 Chronic systolic (congestive) heart failure: Secondary | ICD-10-CM | POA: Diagnosis present

## 2012-04-23 DIAGNOSIS — I2510 Atherosclerotic heart disease of native coronary artery without angina pectoris: Secondary | ICD-10-CM | POA: Diagnosis present

## 2012-04-23 DIAGNOSIS — D649 Anemia, unspecified: Secondary | ICD-10-CM

## 2012-04-23 DIAGNOSIS — R197 Diarrhea, unspecified: Secondary | ICD-10-CM | POA: Diagnosis present

## 2012-04-23 DIAGNOSIS — K219 Gastro-esophageal reflux disease without esophagitis: Secondary | ICD-10-CM

## 2012-04-23 DIAGNOSIS — J440 Chronic obstructive pulmonary disease with acute lower respiratory infection: Principal | ICD-10-CM | POA: Diagnosis present

## 2012-04-23 DIAGNOSIS — Q211 Atrial septal defect: Secondary | ICD-10-CM

## 2012-04-23 DIAGNOSIS — J9600 Acute respiratory failure, unspecified whether with hypoxia or hypercapnia: Secondary | ICD-10-CM

## 2012-04-23 DIAGNOSIS — E7800 Pure hypercholesterolemia, unspecified: Secondary | ICD-10-CM | POA: Diagnosis present

## 2012-04-23 DIAGNOSIS — I639 Cerebral infarction, unspecified: Secondary | ICD-10-CM

## 2012-04-23 HISTORY — DX: Unspecified osteoarthritis, unspecified site: M19.90

## 2012-04-23 HISTORY — DX: Pneumonia, unspecified organism: J18.9

## 2012-04-23 HISTORY — DX: Gastrointestinal hemorrhage, unspecified: K92.2

## 2012-04-23 HISTORY — DX: Hypothyroidism, unspecified: E03.9

## 2012-04-23 HISTORY — DX: Chronic obstructive pulmonary disease, unspecified: J44.9

## 2012-04-23 HISTORY — DX: Iron deficiency anemia, unspecified: D50.9

## 2012-04-23 HISTORY — DX: Heart failure, unspecified: I50.9

## 2012-04-23 LAB — COMPREHENSIVE METABOLIC PANEL
ALT: 20 U/L (ref 0–35)
AST: 20 U/L (ref 0–37)
Albumin: 3.5 g/dL (ref 3.5–5.2)
CO2: 30 mEq/L (ref 19–32)
Calcium: 9.5 mg/dL (ref 8.4–10.5)
GFR calc non Af Amer: 62 mL/min — ABNORMAL LOW (ref >90)
Sodium: 144 mEq/L (ref 135–145)

## 2012-04-23 LAB — CBC WITH DIFFERENTIAL/PLATELET
Basophils Relative: 0 % (ref 0–1)
Eosinophils Absolute: 0 10*3/uL (ref 0.0–0.7)
Eosinophils Relative: 1 % (ref 0–5)
HCT: 34.3 % — ABNORMAL LOW (ref 36.0–46.0)
Hemoglobin: 10.6 g/dL — ABNORMAL LOW (ref 12.0–15.0)
Lymphs Abs: 1.6 10*3/uL (ref 0.7–4.0)
MCH: 26.4 pg (ref 26.0–34.0)
MCHC: 30.9 g/dL (ref 30.0–36.0)
MCV: 85.3 fL (ref 78.0–100.0)
Monocytes Absolute: 0.8 10*3/uL (ref 0.1–1.0)
Monocytes Relative: 12 % (ref 3–12)
Neutrophils Relative %: 64 % (ref 43–77)

## 2012-04-23 MED ORDER — IPRATROPIUM BROMIDE 0.02 % IN SOLN
0.5000 mg | Freq: Once | RESPIRATORY_TRACT | Status: AC
  Administered 2012-04-23: 0.5 mg via RESPIRATORY_TRACT
  Filled 2012-04-23: qty 2.5

## 2012-04-23 MED ORDER — ALBUTEROL SULFATE (5 MG/ML) 0.5% IN NEBU
5.0000 mg | INHALATION_SOLUTION | Freq: Once | RESPIRATORY_TRACT | Status: AC
  Administered 2012-04-23: 5 mg via RESPIRATORY_TRACT
  Filled 2012-04-23: qty 1

## 2012-04-23 MED ORDER — METHYLPREDNISOLONE SODIUM SUCC 125 MG IJ SOLR
125.0000 mg | Freq: Once | INTRAMUSCULAR | Status: AC
  Administered 2012-04-23: 125 mg via INTRAVENOUS
  Filled 2012-04-23: qty 2

## 2012-04-23 MED ORDER — ALBUTEROL SULFATE (5 MG/ML) 0.5% IN NEBU
5.0000 mg | INHALATION_SOLUTION | Freq: Once | RESPIRATORY_TRACT | Status: AC
  Administered 2012-04-23: 5 mg via RESPIRATORY_TRACT
  Filled 2012-04-23: qty 40
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
## 2012-04-23 NOTE — ED Notes (Addendum)
C/o cough (dry cough), bronchitis acting up, low grade fever, CP, sob, & diarrhea. Sx onset Friday. Significant immuno supressed hx. Inhalers not helping. Wearing mask. intermittant dry cough noted frequently. Tachypneic. Pedal pitting edema +1-2 noted. Last episode of diahrrea (watery) ~ 1 hr ago. Knitting in triage. Up to b/r while in triage.

## 2012-04-24 ENCOUNTER — Telehealth: Payer: Self-pay | Admitting: *Deleted

## 2012-04-24 ENCOUNTER — Encounter (HOSPITAL_COMMUNITY): Payer: Self-pay | Admitting: Internal Medicine

## 2012-04-24 DIAGNOSIS — K922 Gastrointestinal hemorrhage, unspecified: Secondary | ICD-10-CM

## 2012-04-24 DIAGNOSIS — R197 Diarrhea, unspecified: Secondary | ICD-10-CM

## 2012-04-24 DIAGNOSIS — I10 Essential (primary) hypertension: Secondary | ICD-10-CM

## 2012-04-24 DIAGNOSIS — J209 Acute bronchitis, unspecified: Secondary | ICD-10-CM

## 2012-04-24 DIAGNOSIS — R0602 Shortness of breath: Secondary | ICD-10-CM

## 2012-04-24 DIAGNOSIS — J449 Chronic obstructive pulmonary disease, unspecified: Secondary | ICD-10-CM

## 2012-04-24 DIAGNOSIS — B20 Human immunodeficiency virus [HIV] disease: Secondary | ICD-10-CM

## 2012-04-24 DIAGNOSIS — I5022 Chronic systolic (congestive) heart failure: Secondary | ICD-10-CM

## 2012-04-24 HISTORY — DX: Gastrointestinal hemorrhage, unspecified: K92.2

## 2012-04-24 LAB — CBC
HCT: 31 % — ABNORMAL LOW (ref 36.0–46.0)
Hemoglobin: 9.6 g/dL — ABNORMAL LOW (ref 12.0–15.0)
MCH: 26.4 pg (ref 26.0–34.0)
MCV: 85.4 fL (ref 78.0–100.0)
Platelets: 170 10*3/uL (ref 150–400)
RBC: 3.63 MIL/uL — ABNORMAL LOW (ref 3.87–5.11)
WBC: 5.5 10*3/uL (ref 4.0–10.5)

## 2012-04-24 LAB — CREATININE, SERUM: GFR calc Af Amer: 87 mL/min — ABNORMAL LOW (ref >90)

## 2012-04-24 LAB — CARDIAC PANEL(CRET KIN+CKTOT+MB+TROPI): Relative Index: 2.6 — ABNORMAL HIGH (ref 0.0–2.5)

## 2012-04-24 LAB — URINALYSIS, ROUTINE W REFLEX MICROSCOPIC
Bilirubin Urine: NEGATIVE
Hgb urine dipstick: NEGATIVE
Specific Gravity, Urine: 1.014 (ref 1.005–1.030)
pH: 6 (ref 5.0–8.0)

## 2012-04-24 LAB — URINE MICROSCOPIC-ADD ON

## 2012-04-24 LAB — POCT PREGNANCY, URINE: Preg Test, Ur: NEGATIVE

## 2012-04-24 MED ORDER — RITONAVIR 100 MG PO TABS
100.0000 mg | ORAL_TABLET | Freq: Two times a day (BID) | ORAL | Status: DC
  Administered 2012-04-24 – 2012-04-25 (×3): 100 mg via ORAL
  Filled 2012-04-24 (×4): qty 1

## 2012-04-24 MED ORDER — ONDANSETRON HCL 4 MG/2ML IJ SOLN: 4.0000 mg | Freq: Four times a day (QID) | INTRAMUSCULAR | Status: DC | PRN

## 2012-04-24 MED ORDER — HYDROCODONE-ACETAMINOPHEN 5-325 MG PO TABS
1.0000 | ORAL_TABLET | Freq: Four times a day (QID) | ORAL | Status: DC | PRN
  Administered 2012-04-24 (×2): 2 via ORAL
  Filled 2012-04-24 (×2): qty 2

## 2012-04-24 MED ORDER — ALBUTEROL SULFATE (5 MG/ML) 0.5% IN NEBU
5.0000 mg | INHALATION_SOLUTION | Freq: Once | RESPIRATORY_TRACT | Status: AC
  Administered 2012-04-24: 5 mg via RESPIRATORY_TRACT
  Filled 2012-04-24: qty 40

## 2012-04-24 MED ORDER — ATORVASTATIN CALCIUM 10 MG PO TABS
10.0000 mg | ORAL_TABLET | Freq: Every day | ORAL | Status: DC
  Administered 2012-04-24: 10 mg via ORAL
  Filled 2012-04-24 (×2): qty 1

## 2012-04-24 MED ORDER — ENOXAPARIN SODIUM 40 MG/0.4ML ~~LOC~~ SOLN
40.0000 mg | SUBCUTANEOUS | Status: DC
  Administered 2012-04-24 – 2012-04-25 (×2): 40 mg via SUBCUTANEOUS
  Filled 2012-04-24 (×2): qty 0.4

## 2012-04-24 MED ORDER — NITROGLYCERIN 0.3 MG SL SUBL
0.3000 mg | SUBLINGUAL_TABLET | SUBLINGUAL | Status: DC | PRN
  Filled 2012-04-24: qty 100

## 2012-04-24 MED ORDER — ONDANSETRON HCL 4 MG PO TABS
4.0000 mg | ORAL_TABLET | Freq: Four times a day (QID) | ORAL | Status: DC | PRN
  Administered 2012-04-24: 4 mg via ORAL
  Filled 2012-04-24: qty 1

## 2012-04-24 MED ORDER — ASPIRIN EC 81 MG PO TBEC
81.0000 mg | DELAYED_RELEASE_TABLET | Freq: Every day | ORAL | Status: DC
  Administered 2012-04-24 – 2012-04-25 (×2): 81 mg via ORAL
  Filled 2012-04-24 (×2): qty 1

## 2012-04-24 MED ORDER — ALBUTEROL SULFATE HFA 108 (90 BASE) MCG/ACT IN AERS
2.0000 | INHALATION_SPRAY | Freq: Four times a day (QID) | RESPIRATORY_TRACT | Status: DC | PRN
  Filled 2012-04-24: qty 6.7

## 2012-04-24 MED ORDER — TRAZODONE HCL 50 MG PO TABS
50.0000 mg | ORAL_TABLET | Freq: Every day | ORAL | Status: DC
  Administered 2012-04-24: 50 mg via ORAL
  Filled 2012-04-24 (×2): qty 1

## 2012-04-24 MED ORDER — HYDROCHLOROTHIAZIDE 25 MG PO TABS
25.0000 mg | ORAL_TABLET | Freq: Every day | ORAL | Status: DC
  Administered 2012-04-24 – 2012-04-25 (×2): 25 mg via ORAL
  Filled 2012-04-24 (×2): qty 1

## 2012-04-24 MED ORDER — PANTOPRAZOLE SODIUM 40 MG PO TBEC
40.0000 mg | DELAYED_RELEASE_TABLET | Freq: Every day | ORAL | Status: DC
  Administered 2012-04-24 – 2012-04-25 (×2): 40 mg via ORAL
  Filled 2012-04-24 (×2): qty 1

## 2012-04-24 MED ORDER — LISINOPRIL 20 MG PO TABS
20.0000 mg | ORAL_TABLET | Freq: Every day | ORAL | Status: DC
  Administered 2012-04-24 – 2012-04-25 (×2): 20 mg via ORAL
  Filled 2012-04-24 (×2): qty 1

## 2012-04-24 MED ORDER — ALBUTEROL SULFATE (5 MG/ML) 0.5% IN NEBU: 2.5000 mg | INHALATION_SOLUTION | RESPIRATORY_TRACT | Status: DC | PRN

## 2012-04-24 MED ORDER — ACETAMINOPHEN 325 MG PO TABS: 650.0000 mg | ORAL_TABLET | Freq: Four times a day (QID) | ORAL | Status: DC | PRN

## 2012-04-24 MED ORDER — DIPHENHYDRAMINE HCL 25 MG PO CAPS
25.0000 mg | ORAL_CAPSULE | Freq: Four times a day (QID) | ORAL | Status: DC
  Administered 2012-04-24 – 2012-04-25 (×6): 25 mg via ORAL
  Filled 2012-04-24 (×9): qty 1

## 2012-04-24 MED ORDER — GABAPENTIN 400 MG PO CAPS
400.0000 mg | ORAL_CAPSULE | Freq: Three times a day (TID) | ORAL | Status: DC
Start: 2012-04-24 — End: 2012-04-25
  Administered 2012-04-24 – 2012-04-25 (×4): 400 mg via ORAL
  Filled 2012-04-24 (×6): qty 1

## 2012-04-24 MED ORDER — MENTHOL 3 MG MT LOZG
1.0000 | LOZENGE | OROMUCOSAL | Status: DC | PRN
  Administered 2012-04-24 – 2012-04-25 (×2): 3 mg via ORAL
  Filled 2012-04-24 (×2): qty 9

## 2012-04-24 MED ORDER — DEXTROSE 5 % IV SOLN
1.0000 g | Freq: Once | INTRAVENOUS | Status: AC
  Administered 2012-04-24: 1 g via INTRAVENOUS
  Filled 2012-04-24: qty 10

## 2012-04-24 MED ORDER — BUDESONIDE 0.5 MG/2ML IN SUSP
0.5000 mg | Freq: Two times a day (BID) | RESPIRATORY_TRACT | Status: DC
  Administered 2012-04-24: 0.5 mg via RESPIRATORY_TRACT
  Filled 2012-04-24 (×6): qty 2

## 2012-04-24 MED ORDER — DARUNAVIR ETHANOLATE 800 MG PO TABS
800.0000 mg | ORAL_TABLET | Freq: Every day | ORAL | Status: DC
Start: 2012-04-24 — End: 2012-04-25
  Administered 2012-04-24 – 2012-04-25 (×2): 800 mg via ORAL
  Filled 2012-04-24 (×5): qty 1

## 2012-04-24 MED ORDER — CARVEDILOL 3.125 MG PO TABS
3.1250 mg | ORAL_TABLET | Freq: Two times a day (BID) | ORAL | Status: DC
  Administered 2012-04-24 – 2012-04-25 (×3): 3.125 mg via ORAL
  Filled 2012-04-24 (×6): qty 1

## 2012-04-24 MED ORDER — IPRATROPIUM BROMIDE 0.02 % IN SOLN
0.5000 mg | Freq: Four times a day (QID) | RESPIRATORY_TRACT | Status: DC
  Administered 2012-04-24 – 2012-04-25 (×5): 0.5 mg via RESPIRATORY_TRACT
  Filled 2012-04-24 (×6): qty 2.5

## 2012-04-24 MED ORDER — ALBUTEROL SULFATE (5 MG/ML) 0.5% IN NEBU
2.5000 mg | INHALATION_SOLUTION | Freq: Four times a day (QID) | RESPIRATORY_TRACT | Status: DC
  Administered 2012-04-24 – 2012-04-25 (×5): 2.5 mg via RESPIRATORY_TRACT
  Filled 2012-04-24 (×6): qty 0.5

## 2012-04-24 MED ORDER — DEXTROSE 5 % IV SOLN
500.0000 mg | Freq: Once | INTRAVENOUS | Status: AC
  Administered 2012-04-24: 500 mg via INTRAVENOUS
  Filled 2012-04-24: qty 500

## 2012-04-24 MED ORDER — ACETAMINOPHEN 650 MG RE SUPP: 650.0000 mg | Freq: Four times a day (QID) | RECTAL | Status: DC | PRN

## 2012-04-24 MED ORDER — POTASSIUM CHLORIDE CRYS ER 20 MEQ PO TBCR
40.0000 meq | EXTENDED_RELEASE_TABLET | Freq: Once | ORAL | Status: AC
  Administered 2012-04-24: 40 meq via ORAL
  Filled 2012-04-24: qty 2

## 2012-04-24 MED ORDER — SODIUM CHLORIDE 0.9 % IJ SOLN: 3.0000 mL | Freq: Two times a day (BID) | INTRAMUSCULAR | Status: DC

## 2012-04-24 MED ORDER — EMTRICITABINE-TENOFOVIR DF 200-300 MG PO TABS
1.0000 | ORAL_TABLET | Freq: Every day | ORAL | Status: DC
  Administered 2012-04-24 – 2012-04-25 (×2): 1 via ORAL
  Filled 2012-04-24 (×2): qty 1

## 2012-04-24 MED ORDER — SODIUM CHLORIDE 0.9 % IJ SOLN
3.0000 mL | Freq: Two times a day (BID) | INTRAMUSCULAR | Status: DC
  Administered 2012-04-24 – 2012-04-25 (×3): 3 mL via INTRAVENOUS

## 2012-04-24 MED ORDER — ISOSORBIDE MONONITRATE ER 60 MG PO TB24
60.0000 mg | ORAL_TABLET | Freq: Every day | ORAL | Status: DC
  Administered 2012-04-24 – 2012-04-25 (×2): 60 mg via ORAL
  Filled 2012-04-24 (×2): qty 1

## 2012-04-24 MED ORDER — DIPHENHYDRAMINE HCL 25 MG PO TABS
25.0000 mg | ORAL_TABLET | Freq: Four times a day (QID) | ORAL | Status: DC
  Filled 2012-04-24 (×5): qty 1

## 2012-04-24 MED ORDER — KETOROLAC TROMETHAMINE 30 MG/ML IJ SOLN
30.0000 mg | Freq: Once | INTRAMUSCULAR | Status: AC
  Administered 2012-04-24: 30 mg via INTRAVENOUS
  Filled 2012-04-24: qty 1

## 2012-04-24 MED ORDER — MOXIFLOXACIN HCL IN NACL 400 MG/250ML IV SOLN
400.0000 mg | INTRAVENOUS | Status: DC
  Administered 2012-04-24 – 2012-04-25 (×2): 400 mg via INTRAVENOUS
  Filled 2012-04-24 (×3): qty 250

## 2012-04-24 NOTE — Progress Notes (Addendum)
Patient was admitted earlier today with complaints of shortness of breath and mild diarrhea. She is known to have a past medical history significant for chronic systolic CHF with an ejection fraction of 25-30%, HIV with a CD4 count greater than 500 and COPD. She is not requiring any oxygen, has not had any fevers. Does not have leukocytosis. I believe she likely has acute bronchitis versus a mild COPD flare. Continue empiric antibiotics, breathing treatments. I anticipate discharge home in the morning. Chest x-ray shows no evidence for acute CHF. She has not had any further diarrhea in the hospital. I strongly suspect that her diarrhea had to do with the Amitiza that she was on for chronic constipation. This has been discontinued at this point.  Peggye Pitt, MD [facility] Pager: [phone]

## 2012-04-24 NOTE — Plan of Care (Signed)
Problem: Phase I Progression Outcomes Goal: EF % per last Echo/documented,Core Reminder form on chart Outcome: Completed/Met Date Met:  04/24/12 EF (Apr.2013)= 25-30%

## 2012-04-24 NOTE — Telephone Encounter (Signed)
PT AWARE OF MONITOR RESULTS  PER DR NISHAN NSR PVC NO PAF
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
## 2012-04-25 ENCOUNTER — Ambulatory Visit: Payer: Medicare PPO | Admitting: Internal Medicine

## 2012-04-25 DIAGNOSIS — J209 Acute bronchitis, unspecified: Secondary | ICD-10-CM

## 2012-04-25 DIAGNOSIS — J449 Chronic obstructive pulmonary disease, unspecified: Secondary | ICD-10-CM

## 2012-04-25 DIAGNOSIS — I5022 Chronic systolic (congestive) heart failure: Secondary | ICD-10-CM

## 2012-04-25 DIAGNOSIS — I509 Heart failure, unspecified: Secondary | ICD-10-CM

## 2012-04-25 LAB — COMPREHENSIVE METABOLIC PANEL
ALT: 16 U/L (ref 0–35)
AST: 12 U/L (ref 0–37)
CO2: 23 mEq/L (ref 19–32)
Chloride: 106 mEq/L (ref 96–112)
GFR calc non Af Amer: >90 mL/min (ref >90)
Sodium: 140 mEq/L (ref 135–145)
Total Bilirubin: <0.1 mg/dL — ABNORMAL LOW (ref 0.3–1.2)

## 2012-04-25 LAB — CBC
MCH: 26.3 pg (ref 26.0–34.0)
MCHC: 30.9 g/dL (ref 30.0–36.0)
Platelets: 186 10*3/uL (ref 150–400)

## 2012-04-25 MED ORDER — MOXIFLOXACIN HCL 400 MG PO TABS: 400.0000 mg | ORAL_TABLET | Freq: Every day | ORAL | Status: AC

## 2012-04-25 MED ORDER — ALBUTEROL SULFATE (5 MG/ML) 0.5% IN NEBU: 2.5000 mg | INHALATION_SOLUTION | RESPIRATORY_TRACT | Status: DC | PRN

## 2012-04-25 MED ORDER — HYDROCODONE-ACETAMINOPHEN 7.5-500 MG/15ML PO SOLN: 15.0000 mL | Freq: Four times a day (QID) | ORAL | Status: AC | PRN

## 2012-04-25 NOTE — Progress Notes (Signed)
Clinical Social Work Department BRIEF PSYCHOSOCIAL ASSESSMENT 04/25/2012  Patient:  [name], [name]     Account Number:  [id number]     Admit date:  04/23/2012  Clinical Social Worker:  Juliette Mangle  Date/Time:  04/25/2012 10:50 AM  Referred by:  RN  Date Referred:  04/24/2012 Referred for  Advanced Directives   Other Referral:   Interview type:  Patient Other interview type:    PSYCHOSOCIAL DATA Living Status:  SIGNIFICANT OTHER Admitted from facility:   Level of care:   Primary support name:  Poteat,Gary Primary support relationship to patient:  FRIEND Degree of support available:   Fair    CURRENT CONCERNS Current Concerns  None Noted   Other Concerns:    SOCIAL WORK ASSESSMENT / PLAN CSW received a referral for Advanced Directives for patient. CSW met with patient at bedside. CSW introduced self, explained role and discussed AD packet. Patient reported that she has been having a lot of health issues and feels she needs a MPOA. CSW provided and reviewed paperwork with  patient. Patient reported that she would look it over and talk to her mpoa. Patient reported that she would probably complete paperwork outside of the hospital and go to a bank to have it notarized. CSW will sign off as social work intervention is no longer needed.   Assessment/plan status:  No Further Intervention Required Other assessment/ plan:   Information/referral to community resources:   AD packet    PATIENT'S/FAMILY'S RESPONSE TO PLAN OF CARE: Patient was very pleasant and was very receptive and appreciative of information and resources provided by CSW. CSW will sign off.    Sabino Niemann, MSW, [employer] [phone]

## 2012-04-25 NOTE — Progress Notes (Signed)
DC IV, DC Tele, DC Home. Discharge instructions and home medications discussed with patient. Patient denies any questions or concerns at this time. Patient leaving unit via wheelchair and appears in no acute distress. 

## 2012-04-25 NOTE — Discharge Summary (Signed)
Physician Discharge Summary  Patient ID: [name] MRN: [id number] DOB/AGE: Jan 04, 1961 51 y.o.  Admit date: 04/23/2012 Discharge date: 04/25/2012  Primary Care Physician:  Yisroel Ramming, MD   Discharge Diagnoses:    Principal Problem:  *Acute bronchitis Active Problems:  CAD  COPD  Chronic systolic CHF (congestive heart failure)  Shortness of breath  Diarrhea    Medication List  As of 04/25/2012 12:41 PM   STOP taking these medications         diphenhydrAMINE 25 MG tablet      lubiprostone 24 MCG capsule         TAKE these medications         albuterol 108 (90 BASE) MCG/ACT inhaler   Commonly known as: PROVENTIL HFA;VENTOLIN HFA   Inhale 2 puffs into the lungs every 6 (six) hours as needed. For wheezing and shortness of breath      albuterol (5 MG/ML) 0.5% nebulizer solution   Commonly known as: PROVENTIL   Take 0.5 mLs (2.5 mg total) by nebulization every 2 (two) hours as needed for wheezing.      aspirin EC 81 MG tablet   Take 81 mg by mouth daily.      carvedilol 3.125 MG tablet   Commonly known as: COREG   Take 3.125 mg by mouth 2 (two) times daily with a meal.      Darunavir Ethanolate 800 MG tablet   Commonly known as: PREZISTA   Take 800 mg by mouth daily with breakfast.      diphenoxylate-atropine 2.5-0.025 MG per tablet   Commonly known as: LOMOTIL   Take 1 tablet by mouth 4 (four) times daily as needed. For loose stool      emtricitabine-tenofovir 200-300 MG per tablet   Commonly known as: TRUVADA   Take 1 tablet by mouth daily.      hydrochlorothiazide 25 MG tablet   Commonly known as: HYDRODIURIL   Take 25 mg by mouth daily.      HYDROcodone-acetaminophen 7.5-500 MG/15ML solution   Commonly known as: LORTAB   Take 15 mLs by mouth every 6 (six) hours as needed for pain.      HYDROcodone-acetaminophen 5-500 MG per tablet   Commonly known as: VICODIN   Take 1-2 tablets by mouth every 6 (six) hours as needed. For pain     ibuprofen 200 MG tablet   Commonly known as: ADVIL,MOTRIN   Take 400 mg by mouth every 6 (six) hours as needed. For pain      isosorbide mononitrate 60 MG 24 hr tablet   Commonly known as: IMDUR   Take 60 mg by mouth daily.      leuprolide 11.25 MG injection   Commonly known as: LUPRON   Inject 11.25 mg into the muscle every 3 (three) months.      lisinopril 20 MG tablet   Commonly known as: PRINIVIL,ZESTRIL   Take 20 mg by mouth daily.      moxifloxacin 400 MG tablet   Commonly known as: AVELOX   Take 1 tablet (400 mg total) by mouth daily.      NEURONTIN 400 MG capsule   Generic drug: gabapentin   Take 400 mg by mouth 3 (three) times daily.      nitroGLYCERIN 0.3 MG SL tablet   Commonly known as: NITROSTAT   Place 0.3 mg under the tongue every 5 (five) minutes as needed. For chest pain      oxyCODONE-acetaminophen 10-325 MG per tablet  Commonly known as: PERCOCET   Take 1 tablet by mouth every 4 (four) hours as needed. For pain      pantoprazole 20 MG tablet   Commonly known as: PROTONIX   Take 2 tablets (40 mg total) by mouth daily.      ritonavir 100 MG capsule   Commonly known as: NORVIR   Take 100 mg by mouth 2 (two) times daily.      rosuvastatin 5 MG tablet   Commonly known as: CRESTOR   Take 5 mg by mouth daily at 6 PM.      traZODone 50 MG tablet   Commonly known as: DESYREL   Take 1 tablet (50 mg total) by mouth at bedtime.      valACYclovir 500 MG tablet   Commonly known as: VALTREX   Take 500 mg by mouth 2 (two) times daily as needed. For break out             Disposition and Follow-up:  Patient will be discharged home today in stable condition. She is to complete a course of antibiotics for her acute bronchitis. Has been instructed to followup with her primary care physician in about 2 weeks or sooner if she has shortness of breath.  Consults:  None    Significant Diagnostic Studies:  Dg Chest 2 View  04/23/2012  *RADIOLOGY REPORT*   Clinical Data: Cough, fever.  CHEST - 2 VIEW  Comparison: March 20, 2012.  Findings: Cardiomediastinal silhouette appears normal.  No acute pulmonary disease is noted.  Bony thorax is intact.  IMPRESSION: No acute cardiopulmonary abnormality seen.  Original Report Authenticated By: Venita Sheffield., M.D.    Brief H and P: For complete details please refer to admission H and P, but in brief patient is a 51 year-old female with history of CAD status post stenting, cardiomyopathy last EF measured was 25-30%, HIV last CD4 count was more than 500 in may 2013 and 2 weeks ago viral load was undetectable, admitted in May for acute ischemia received TPA presents with complaints of shortness of breath over the last few days. The shortness of breath is nonexertional is present even at rest associated cough and productive sputum. Denies any fever chills chest pain. Over the last 2 days patient also has been having diarrhea at least 4 for episodes in a day with no abdominal pain or nausea vomiting. She was admitted to our service for further evaluation and management.     Hospital Course:  Principal Problem:  *Acute bronchitis Active Problems:  CAD  COPD  Chronic systolic CHF (congestive heart failure)  Shortness of breath  Diarrhea   #1 acute bronchitis: This is the most likely explanation for her shortness of breath and cough. She has improved on antibiotic therapy as well as breathing treatments while in the hospital. Her chest x-ray did not show any evidence for overt CHF. Will be discharged home today with followup with her primary care provider.  #2 diarrhea: Resolved. Has had no diarrhea in the hospital. I do believe that her diarrhea was related to the bowel regimen that she is on for her chronic constipation related to narcotics. She has been instructed to suspend use of amitiza.  Rest of chronic conditions have been stable this hospitalization at home medications have not been altered.  Time  spent on Discharge: Greater than 30 minutes.  SignedChaya Jan [facility] Pager: [phone] 04/25/2012, 12:41 PM

## 2012-04-28 LAB — STOOL CULTURE

## 2012-05-01 ENCOUNTER — Telehealth: Payer: Self-pay

## 2012-05-01 NOTE — Telephone Encounter (Signed)
LM for pt that [employer] did authorize  Her Lupron Depot 11.25 mg kit. Pt sees AR again on the 10th of Sept. For EBX.  Auth called to [employer]. JO, CMA

## 2012-05-02 ENCOUNTER — Other Ambulatory Visit: Payer: Self-pay | Admitting: Infectious Disease

## 2012-05-10 ENCOUNTER — Telehealth: Payer: Self-pay

## 2012-05-10 NOTE — Telephone Encounter (Signed)
Called [facility] to let them know that we have already acquired prior auth for her Depo Lupron. Approval letter was faxed to her pharmacy at [phone]. [name]

## 2012-05-11 ENCOUNTER — Telehealth: Payer: Self-pay

## 2012-05-11 ENCOUNTER — Encounter: Payer: Self-pay | Admitting: Internal Medicine

## 2012-05-11 ENCOUNTER — Encounter: Payer: Self-pay | Admitting: *Deleted

## 2012-05-11 ENCOUNTER — Ambulatory Visit (INDEPENDENT_AMBULATORY_CARE_PROVIDER_SITE_OTHER): Payer: Medicare PPO | Admitting: Internal Medicine

## 2012-05-11 VITALS — BP 136/62 | HR 86 | Temp 97.9°F | Ht 62.0 in | Wt 273.0 lb

## 2012-05-11 DIAGNOSIS — B20 Human immunodeficiency virus [HIV] disease: Secondary | ICD-10-CM

## 2012-05-11 MED ORDER — ELVITEG-COBIC-EMTRICIT-TENOFDF 150-150-200-300 MG PO TABS: 1.0000 | ORAL_TABLET | Freq: Every day | ORAL | Status: DC

## 2012-05-11 NOTE — Assessment & Plan Note (Addendum)
She continues to do well and with her good compliance, by her request, I will change her to [place]. I will have her return in about 6 weeks for labs to assure no issues and I will followup with her 2 weeks later. She does call if she has any problems with the medication.  She is establishing with a new primary care physician.  I will defer her Pap smear to her primary physician and lipid management to cardiology.  She was provided a note to limit her physical activity due to her underlying chronic illness  > 40 minutes spent in exam, counseling of new medication regimen and coordination of care.

## 2012-05-11 NOTE — Patient Instructions (Addendum)
Start the new medicine when it arrives and get labs in about 6 weeks

## 2012-05-11 NOTE — Progress Notes (Signed)
  Subjective:    Patient ID: [name], female    DOB: 1961-03-07, 51 y.o.   MRN: [id number]  HPI She comes in for followup of her HIV. She previously had been out of care but has been continuing to followup. She continues on her persistent, Norvir and Truvada. She denies any missed doses. She is interested in changing to a one pill a day regimen to improve her polypharmacy. She recently was then changing her primary care physician since she previously went to [facility].  She also started in school and is in a physical education class and is requesting a note to modify her class due to her deconditioning and chronic medical problems.   Review of Systems  Constitutional: Negative for fever, chills, appetite change, fatigue and unexpected weight change.  HENT: Negative for sore throat and trouble swallowing.   Respiratory: Negative for shortness of breath and stridor.   Cardiovascular: Negative for chest pain, palpitations and leg swelling.  Gastrointestinal: Negative for nausea, abdominal pain and diarrhea.  Musculoskeletal: Negative for myalgias, joint swelling and arthralgias.  Skin: Negative for rash.  Neurological: Negative for dizziness, light-headedness and headaches.       Objective:   Physical Exam  Constitutional: She appears well-developed and well-nourished. No distress.  HENT:  Mouth/Throat: Oropharynx is clear and moist. No oropharyngeal exudate.  Cardiovascular: Normal rate, regular rhythm and normal heart sounds.  Exam reveals no gallop and no friction rub.   No murmur heard. Pulmonary/Chest: Effort normal and breath sounds normal. No respiratory distress. She has no wheezes. She has no rales.  Abdominal: Soft. Bowel sounds are normal. She exhibits no distension. There is no tenderness. There is no rebound.  Lymphadenopathy:    She has no cervical adenopathy.          Assessment & Plan:

## 2012-05-16 ENCOUNTER — Ambulatory Visit: Payer: Medicare PPO | Admitting: Dietician

## 2012-05-16 ENCOUNTER — Ambulatory Visit (INDEPENDENT_AMBULATORY_CARE_PROVIDER_SITE_OTHER): Payer: Medicaid Other | Admitting: Obstetrics and Gynecology

## 2012-05-16 ENCOUNTER — Encounter: Payer: Self-pay | Admitting: Obstetrics and Gynecology

## 2012-05-16 VITALS — BP 120/82 | Wt 271.0 lb

## 2012-05-16 DIAGNOSIS — N950 Postmenopausal bleeding: Secondary | ICD-10-CM

## 2012-05-16 DIAGNOSIS — Z0189 Encounter for other specified special examinations: Secondary | ICD-10-CM

## 2012-05-16 LAB — POCT URINE PREGNANCY: Preg Test, Ur: NEGATIVE

## 2012-05-16 MED ORDER — HYDROCODONE-ACETAMINOPHEN 5-300 MG PO TABS: 1.0000 | ORAL_TABLET | Freq: Four times a day (QID) | ORAL | Status: DC | PRN

## 2012-05-16 NOTE — Progress Notes (Signed)
Last episode of bleeding in April or May.  Requests one more Rx of vicodin for LLQ pain.  Filed Vitals:   05/16/12 1059  BP: 120/82   EmBx performed per protocol Pipelle passed x 3 to 8cm  A/P RTO 1-2wks to review results PT is not a candidate for lupron secondary to PM

## 2012-05-16 NOTE — Addendum Note (Signed)
Addended by: Marla Roe A on: 05/16/2012 02:22 PM   Modules accepted: Orders

## 2012-05-18 LAB — PATHOLOGY

## 2012-05-19 ENCOUNTER — Encounter: Payer: Self-pay | Admitting: Cardiovascular Disease

## 2012-05-30 ENCOUNTER — Encounter: Payer: Medicare PPO | Admitting: Obstetrics and Gynecology

## 2012-06-02 ENCOUNTER — Other Ambulatory Visit: Payer: Self-pay | Admitting: Infectious Disease

## 2012-06-06 ENCOUNTER — Encounter: Payer: Self-pay | Admitting: Obstetrics and Gynecology

## 2012-06-06 ENCOUNTER — Emergency Department (HOSPITAL_COMMUNITY): Payer: Medicare PPO

## 2012-06-06 ENCOUNTER — Emergency Department (HOSPITAL_COMMUNITY)
Admission: EM | Admit: 2012-06-06 | Discharge: 2012-06-07 | Disposition: A | Discharge status: Home / Self Care | Payer: Medicare PPO | Attending: Emergency Medicine | Admitting: Emergency Medicine

## 2012-06-06 ENCOUNTER — Ambulatory Visit (INDEPENDENT_AMBULATORY_CARE_PROVIDER_SITE_OTHER): Payer: Medicaid Other | Admitting: Obstetrics and Gynecology

## 2012-06-06 ENCOUNTER — Encounter (HOSPITAL_COMMUNITY): Payer: Self-pay | Admitting: Emergency Medicine

## 2012-06-06 VITALS — BP 122/72 | Wt 278.0 lb

## 2012-06-06 DIAGNOSIS — I69921 Dysphasia following unspecified cerebrovascular disease: Secondary | ICD-10-CM | POA: Insufficient documentation

## 2012-06-06 DIAGNOSIS — F411 Generalized anxiety disorder: Secondary | ICD-10-CM | POA: Insufficient documentation

## 2012-06-06 DIAGNOSIS — M79673 Pain in unspecified foot: Secondary | ICD-10-CM

## 2012-06-06 DIAGNOSIS — J449 Chronic obstructive pulmonary disease, unspecified: Secondary | ICD-10-CM | POA: Insufficient documentation

## 2012-06-06 DIAGNOSIS — J4489 Other specified chronic obstructive pulmonary disease: Secondary | ICD-10-CM | POA: Insufficient documentation

## 2012-06-06 DIAGNOSIS — I10 Essential (primary) hypertension: Secondary | ICD-10-CM | POA: Insufficient documentation

## 2012-06-06 DIAGNOSIS — I252 Old myocardial infarction: Secondary | ICD-10-CM | POA: Insufficient documentation

## 2012-06-06 DIAGNOSIS — M1710 Unilateral primary osteoarthritis, unspecified knee: Secondary | ICD-10-CM | POA: Insufficient documentation

## 2012-06-06 DIAGNOSIS — E7800 Pure hypercholesterolemia, unspecified: Secondary | ICD-10-CM | POA: Insufficient documentation

## 2012-06-06 DIAGNOSIS — Z9861 Coronary angioplasty status: Secondary | ICD-10-CM | POA: Insufficient documentation

## 2012-06-06 DIAGNOSIS — N924 Excessive bleeding in the premenopausal period: Secondary | ICD-10-CM

## 2012-06-06 DIAGNOSIS — E039 Hypothyroidism, unspecified: Secondary | ICD-10-CM | POA: Insufficient documentation

## 2012-06-06 DIAGNOSIS — I509 Heart failure, unspecified: Secondary | ICD-10-CM | POA: Insufficient documentation

## 2012-06-06 DIAGNOSIS — Z87891 Personal history of nicotine dependence: Secondary | ICD-10-CM | POA: Insufficient documentation

## 2012-06-06 DIAGNOSIS — M79609 Pain in unspecified limb: Secondary | ICD-10-CM | POA: Insufficient documentation

## 2012-06-06 NOTE — Progress Notes (Signed)
Here to review EmBx results  Endometrium - Biopsy, uterine lining:  Benign atrophic endometrial polyp.  Strips of superficial, inactive endometrium.  Negative for hyperplasia, atypia or malignancy.   Pt visibly uncomfortable. No further bleeding since April or May.  Filed Vitals:   06/06/12 1427  BP: 122/72   Bx results reviewed  A/P Options and recs discussed Pt agreeable to obs for now and will see PCP about pain which is unlikely GYN. She has appt with PCP on 10/15.

## 2012-06-06 NOTE — ED Notes (Signed)
Per EMS: Pt woke up at 0730 this morning and was having a small amount of pain on the top of her right foot.  It has increasingly gotten worse throughout the day.  Pt unable to ambulate on it.  Pt also c/o congestion, coughing, and nasal drainage.

## 2012-06-07 ENCOUNTER — Ambulatory Visit: Payer: Medicare PPO | Admitting: Dietician

## 2012-06-07 ENCOUNTER — Emergency Department (HOSPITAL_COMMUNITY): Payer: Medicare PPO

## 2012-06-07 MED ORDER — HYDROCODONE-ACETAMINOPHEN 5-325 MG PO TABS: 1.0000 | ORAL_TABLET | Freq: Four times a day (QID) | ORAL | Status: DC | PRN

## 2012-06-07 MED ORDER — HYDROCODONE-ACETAMINOPHEN 5-325 MG PO TABS
1.0000 | ORAL_TABLET | Freq: Once | ORAL | Status: AC
  Administered 2012-06-07: 1 via ORAL
  Filled 2012-06-07: qty 1

## 2012-06-07 NOTE — ED Provider Notes (Signed)
Medical screening examination/treatment/procedure(s) were performed by non-physician practitioner and as supervising physician I was immediately available for consultation/collaboration.    Vida Roller, MD 06/07/12 [phone]
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
## 2012-06-08 ENCOUNTER — Emergency Department (HOSPITAL_COMMUNITY)
Admission: EM | Admit: 2012-06-08 | Discharge: 2012-06-08 | Disposition: A | Discharge status: Home / Self Care | Payer: Medicare PPO | Attending: Emergency Medicine | Admitting: Emergency Medicine

## 2012-06-08 ENCOUNTER — Encounter (HOSPITAL_COMMUNITY): Payer: Self-pay | Admitting: Physical Medicine and Rehabilitation

## 2012-06-08 ENCOUNTER — Encounter: Payer: Medicare PPO | Attending: Internal Medicine | Admitting: *Deleted

## 2012-06-08 DIAGNOSIS — L02619 Cutaneous abscess of unspecified foot: Secondary | ICD-10-CM | POA: Insufficient documentation

## 2012-06-08 DIAGNOSIS — Z21 Asymptomatic human immunodeficiency virus [HIV] infection status: Secondary | ICD-10-CM | POA: Insufficient documentation

## 2012-06-08 DIAGNOSIS — I10 Essential (primary) hypertension: Secondary | ICD-10-CM | POA: Insufficient documentation

## 2012-06-08 DIAGNOSIS — L03115 Cellulitis of right lower limb: Secondary | ICD-10-CM

## 2012-06-08 DIAGNOSIS — E669 Obesity, unspecified: Secondary | ICD-10-CM

## 2012-06-08 DIAGNOSIS — R509 Fever, unspecified: Secondary | ICD-10-CM | POA: Insufficient documentation

## 2012-06-08 DIAGNOSIS — M79609 Pain in unspecified limb: Secondary | ICD-10-CM | POA: Insufficient documentation

## 2012-06-08 DIAGNOSIS — Z79899 Other long term (current) drug therapy: Secondary | ICD-10-CM | POA: Insufficient documentation

## 2012-06-08 DIAGNOSIS — Z713 Dietary counseling and surveillance: Secondary | ICD-10-CM | POA: Insufficient documentation

## 2012-06-08 LAB — BASIC METABOLIC PANEL
BUN: 12 mg/dL (ref 6–23)
CO2: 26 mEq/L (ref 19–32)
Calcium: 9.5 mg/dL (ref 8.4–10.5)
Chloride: 99 mEq/L (ref 96–112)
Creatinine, Ser: 0.89 mg/dL (ref 0.50–1.10)
GFR calc Af Amer: 86 mL/min — ABNORMAL LOW (ref >90)
GFR calc non Af Amer: 74 mL/min — ABNORMAL LOW (ref >90)
Glucose, Bld: 103 mg/dL — ABNORMAL HIGH (ref 70–99)
Potassium: 3.8 mEq/L (ref 3.5–5.1)
Sodium: 134 mEq/L — ABNORMAL LOW (ref 135–145)

## 2012-06-08 LAB — CBC WITH DIFFERENTIAL/PLATELET
Basophils Absolute: 0 10*3/uL (ref 0.0–0.1)
Basophils Relative: 0 % (ref 0–1)
Eosinophils Absolute: 0 10*3/uL (ref 0.0–0.7)
Eosinophils Relative: 0 % (ref 0–5)
HCT: 33.1 % — ABNORMAL LOW (ref 36.0–46.0)
Hemoglobin: 10.6 g/dL — ABNORMAL LOW (ref 12.0–15.0)
Lymphocytes Relative: 8 % — ABNORMAL LOW (ref 12–46)
Lymphs Abs: 0.6 10*3/uL — ABNORMAL LOW (ref 0.7–4.0)
MCH: 27.1 pg (ref 26.0–34.0)
MCHC: 32 g/dL (ref 30.0–36.0)
MCV: 84.7 fL (ref 78.0–100.0)
Monocytes Absolute: 0.8 10*3/uL (ref 0.1–1.0)
Monocytes Relative: 11 % (ref 3–12)
Neutro Abs: 6.1 10*3/uL (ref 1.7–7.7)
Neutrophils Relative %: 81 % — ABNORMAL HIGH (ref 43–77)
Platelets: 135 10*3/uL — ABNORMAL LOW (ref 150–400)
RBC: 3.91 MIL/uL (ref 3.87–5.11)
RDW: 15.6 % — ABNORMAL HIGH (ref 11.5–15.5)
WBC: 7.5 10*3/uL (ref 4.0–10.5)

## 2012-06-08 MED ORDER — VANCOMYCIN HCL IN DEXTROSE 1-5 GM/200ML-% IV SOLN
1000.0000 mg | Freq: Once | INTRAVENOUS | Status: AC
  Administered 2012-06-08: 1000 mg via INTRAVENOUS
  Filled 2012-06-08: qty 200

## 2012-06-08 MED ORDER — FLUCONAZOLE 150 MG PO TABS: 150.0000 mg | ORAL_TABLET | Freq: Once | ORAL | Status: DC

## 2012-06-08 MED ORDER — KETOROLAC TROMETHAMINE 15 MG/ML IJ SOLN
15.0000 mg | Freq: Once | INTRAMUSCULAR | Status: AC
  Filled 2012-06-08: qty 1

## 2012-06-08 MED ORDER — HYDROCODONE-ACETAMINOPHEN 5-325 MG PO TABS
1.0000 | ORAL_TABLET | Freq: Once | ORAL | Status: AC
  Administered 2012-06-08: 1 via ORAL
  Filled 2012-06-08: qty 1

## 2012-06-08 MED ORDER — SULFAMETHOXAZOLE-TRIMETHOPRIM 800-160 MG PO TABS: 2.0000 | ORAL_TABLET | Freq: Two times a day (BID) | ORAL | Status: DC

## 2012-06-08 MED ORDER — HYDROMORPHONE HCL PF 1 MG/ML IJ SOLN
1.0000 mg | Freq: Once | INTRAMUSCULAR | Status: AC
  Administered 2012-06-08: 1 mg via INTRAVENOUS
  Filled 2012-06-08: qty 1

## 2012-06-08 MED ORDER — KETOROLAC TROMETHAMINE 30 MG/ML IJ SOLN
INTRAMUSCULAR | Status: AC
  Administered 2012-06-08: 15 mg
  Filled 2012-06-08: qty 1

## 2012-06-08 MED ORDER — ONDANSETRON HCL 4 MG/2ML IJ SOLN
4.0000 mg | Freq: Once | INTRAMUSCULAR | Status: AC
  Administered 2012-06-08: 4 mg via INTRAVENOUS
  Filled 2012-06-08: qty 2

## 2012-06-08 NOTE — ED Notes (Signed)
Pt presents to department for evaluation of R foot pain. States she was seen at [facility] ED Tuesday for same and discharged home. Now states increased swelling and discomfort that radiates to ankle. 10/10 pain that becomes worse with movement. Pedal pulses present.

## 2012-06-08 NOTE — Patient Instructions (Addendum)
   Go the the emergency room and get your foot looked after.  Drink plenty of fluids.  32-48 oz per day.  Try to eat about every 5 hours during the day.  Either a meal or large snack.  Try to get more of the vegetables (non-starchy) 3 servings per day and use the frozen or the canned that you rinse well before cooking or warming up.

## 2012-06-08 NOTE — ED Notes (Signed)
CSW advised pt on where to get her standard walker.  Pt has used [employer] in the past and wished to use the same company for her DME.

## 2012-06-08 NOTE — Progress Notes (Signed)
  Medical Nutrition Therapy:  Appt start time: 1030 end time:  1130.   Assessment:  Primary concerns today: Comes today for assistance with losing weight.  Gives history of having been a larger lady and with recent stroke, her activity level has changed.  Expresses desire to learn what to eat and how to lose some weight.  She is at [employer] getting an associate degree and has taken a PE class and had to keep a food diary.  She found that doing the diary, she was "eating the wrong things."   Today this appointment is a struggle for her.  She is in a W/C because she is unable to walk with the pain in her RT foot.  She is not wearing a shoe.  The foot is swollen and very warm to touch.  She related she was at the ED on Tuesday, "but they did not do anything for her" .  She notes that it has gotten progressively more painful.  I encouraged her to go to the ED across the street when she left the appointment.  MEDICATIONS: Completed the medication review.   DIETARY INTAKE:  Usual eating pattern includes 2-3 meals and 1-2 snacks per day.  24-hr recall:  B ( AM):  11:00 egg and bacon on wheat toast, with jelly, regular, and flavored water.  Snk ( AM): drank water or flavored water.   L ( PM) Snk ( PM): D ( PM): 6;00-7:00 fish sticks (6) and french fries (small).Flavored water. Snk ( PM): Middle of night had pretzels and potato chips. Beverages: flavored water, sodas at times.  Using sugar free to flavor the water.  Usual physical activity: Prior to Tuesday, was doing PE class at [facility].  On Wednesday and Friday.  Soes 20 minutes of cardio and 6 machines.  Fit test.  1/2 mile in 15 minutes.  Estimated energy needs:  HT:62 in  WT:(Could not stand to weigh) reported 278 lb  BMI: 51 kg/m2  Adj WT: 164 lb (74 kg) 1400 calories for weight loss 155-160 g carbohydrates 100-105 g protein 36-39 g fat  Progress Towards Goal(s):  No progress.   Nutritional Diagnosis:  [place]-3.3 Overweight/obesity As related to  limited physical activity, increased intake of regular soda, fried foods and fast foods.  As evidenced by weight of 278 lb and BMI of 51 kg/m2.    Intervention:  Nutrition Brief review of the need to have regular meals and snacks.  Use of a large snack rather than a meal at times instead of skipping meals.  Advised to use diet soda, to read food labels, and to monitor portion sizes. Provided a My Plate poster with the list for serving sizes for the food groups. Recommended limiting starch to 1/2-1 cup and 1 fruit serving at the meal and 1/2 of the plate being the non-starchy vegetable.  I'm unsure how much she comprehended as she would occasionally moan with pain.  Handouts given during visit include:  My Plate Poster  Yellow card with serving sizes.  Monitoring/Evaluation:  Dietary intake, exercise, and body weight in 6 weeks when her foot is better.

## 2012-06-08 NOTE — Progress Notes (Signed)
Orthopedic Tech Progress Note Patient Details:  [name] 1961/01/13 [id number]  Ortho Devices Type of Ortho Device: Postop boot Ortho Device/Splint Location: right foot Ortho Device/Splint Interventions: Application   Nikki Dom 06/08/2012, 4:57 PM
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
## 2012-06-08 NOTE — ED Notes (Signed)
SW at bedside prior to d/c

## 2012-06-09 ENCOUNTER — Observation Stay (HOSPITAL_COMMUNITY): Payer: Medicare PPO

## 2012-06-09 ENCOUNTER — Inpatient Hospital Stay (HOSPITAL_COMMUNITY)
Admission: EM | Admit: 2012-06-09 | Discharge: 2012-06-15 | DRG: 602 | Disposition: A | Discharge status: Skilled Nursing Facility | Payer: Medicare PPO | Attending: Internal Medicine | Admitting: Internal Medicine

## 2012-06-09 ENCOUNTER — Emergency Department (HOSPITAL_COMMUNITY): Payer: Medicare PPO

## 2012-06-09 ENCOUNTER — Encounter (HOSPITAL_COMMUNITY): Payer: Self-pay | Admitting: Emergency Medicine

## 2012-06-09 ENCOUNTER — Encounter: Payer: Self-pay | Admitting: Dietician

## 2012-06-09 DIAGNOSIS — D649 Anemia, unspecified: Secondary | ICD-10-CM

## 2012-06-09 DIAGNOSIS — Z8673 Personal history of transient ischemic attack (TIA), and cerebral infarction without residual deficits: Secondary | ICD-10-CM

## 2012-06-09 DIAGNOSIS — Q211 Atrial septal defect: Secondary | ICD-10-CM

## 2012-06-09 DIAGNOSIS — I509 Heart failure, unspecified: Secondary | ICD-10-CM | POA: Diagnosis present

## 2012-06-09 DIAGNOSIS — Z23 Encounter for immunization: Secondary | ICD-10-CM

## 2012-06-09 DIAGNOSIS — Z7982 Long term (current) use of aspirin: Secondary | ICD-10-CM

## 2012-06-09 DIAGNOSIS — Z87898 Personal history of other specified conditions: Secondary | ICD-10-CM

## 2012-06-09 DIAGNOSIS — I5022 Chronic systolic (congestive) heart failure: Secondary | ICD-10-CM | POA: Diagnosis present

## 2012-06-09 DIAGNOSIS — N946 Dysmenorrhea, unspecified: Secondary | ICD-10-CM

## 2012-06-09 DIAGNOSIS — F329 Major depressive disorder, single episode, unspecified: Secondary | ICD-10-CM

## 2012-06-09 DIAGNOSIS — I2510 Atherosclerotic heart disease of native coronary artery without angina pectoris: Secondary | ICD-10-CM

## 2012-06-09 DIAGNOSIS — M1990 Unspecified osteoarthritis, unspecified site: Secondary | ICD-10-CM

## 2012-06-09 DIAGNOSIS — Z87891 Personal history of nicotine dependence: Secondary | ICD-10-CM

## 2012-06-09 DIAGNOSIS — R197 Diarrhea, unspecified: Secondary | ICD-10-CM

## 2012-06-09 DIAGNOSIS — K644 Residual hemorrhoidal skin tags: Secondary | ICD-10-CM

## 2012-06-09 DIAGNOSIS — Z6841 Body Mass Index (BMI) 40.0 and over, adult: Secondary | ICD-10-CM

## 2012-06-09 DIAGNOSIS — J449 Chronic obstructive pulmonary disease, unspecified: Secondary | ICD-10-CM | POA: Diagnosis present

## 2012-06-09 DIAGNOSIS — Z21 Asymptomatic human immunodeficiency virus [HIV] infection status: Secondary | ICD-10-CM

## 2012-06-09 DIAGNOSIS — E039 Hypothyroidism, unspecified: Secondary | ICD-10-CM | POA: Diagnosis present

## 2012-06-09 DIAGNOSIS — E785 Hyperlipidemia, unspecified: Secondary | ICD-10-CM

## 2012-06-09 DIAGNOSIS — R0602 Shortness of breath: Secondary | ICD-10-CM

## 2012-06-09 DIAGNOSIS — R110 Nausea: Secondary | ICD-10-CM

## 2012-06-09 DIAGNOSIS — J9600 Acute respiratory failure, unspecified whether with hypoxia or hypercapnia: Secondary | ICD-10-CM

## 2012-06-09 DIAGNOSIS — G4700 Insomnia, unspecified: Secondary | ICD-10-CM

## 2012-06-09 DIAGNOSIS — L02419 Cutaneous abscess of limb, unspecified: Principal | ICD-10-CM | POA: Diagnosis present

## 2012-06-09 DIAGNOSIS — L03119 Cellulitis of unspecified part of limb: Principal | ICD-10-CM | POA: Diagnosis present

## 2012-06-09 DIAGNOSIS — B009 Herpesviral infection, unspecified: Secondary | ICD-10-CM

## 2012-06-09 DIAGNOSIS — J4489 Other specified chronic obstructive pulmonary disease: Secondary | ICD-10-CM | POA: Diagnosis present

## 2012-06-09 DIAGNOSIS — I10 Essential (primary) hypertension: Secondary | ICD-10-CM

## 2012-06-09 DIAGNOSIS — B20 Human immunodeficiency virus [HIV] disease: Secondary | ICD-10-CM | POA: Diagnosis present

## 2012-06-09 DIAGNOSIS — L0390 Cellulitis, unspecified: Secondary | ICD-10-CM

## 2012-06-09 DIAGNOSIS — F411 Generalized anxiety disorder: Secondary | ICD-10-CM

## 2012-06-09 DIAGNOSIS — E876 Hypokalemia: Secondary | ICD-10-CM | POA: Diagnosis present

## 2012-06-09 DIAGNOSIS — L03115 Cellulitis of right lower limb: Secondary | ICD-10-CM | POA: Diagnosis present

## 2012-06-09 DIAGNOSIS — I5042 Chronic combined systolic (congestive) and diastolic (congestive) heart failure: Secondary | ICD-10-CM | POA: Diagnosis present

## 2012-06-09 DIAGNOSIS — I639 Cerebral infarction, unspecified: Secondary | ICD-10-CM

## 2012-06-09 DIAGNOSIS — Z9189 Other specified personal risk factors, not elsewhere classified: Secondary | ICD-10-CM

## 2012-06-09 DIAGNOSIS — E669 Obesity, unspecified: Secondary | ICD-10-CM | POA: Diagnosis present

## 2012-06-09 DIAGNOSIS — R7989 Other specified abnormal findings of blood chemistry: Secondary | ICD-10-CM | POA: Diagnosis not present

## 2012-06-09 DIAGNOSIS — N179 Acute kidney failure, unspecified: Secondary | ICD-10-CM | POA: Diagnosis present

## 2012-06-09 DIAGNOSIS — J209 Acute bronchitis, unspecified: Secondary | ICD-10-CM

## 2012-06-09 LAB — COMPREHENSIVE METABOLIC PANEL
ALT: 28 U/L (ref 0–35)
AST: 34 U/L (ref 0–37)
Calcium: 10 mg/dL (ref 8.4–10.5)
Creatinine, Ser: 1.01 mg/dL (ref 0.50–1.10)
GFR calc Af Amer: 73 mL/min — ABNORMAL LOW (ref >90)
Sodium: 138 mEq/L (ref 135–145)
Total Protein: 7.3 g/dL (ref 6.0–8.3)

## 2012-06-09 LAB — CK: Total CK: 77 U/L (ref 7–177)

## 2012-06-09 LAB — CBC WITH DIFFERENTIAL/PLATELET
Basophils Absolute: 0 10*3/uL (ref 0.0–0.1)
Eosinophils Absolute: 0 10*3/uL (ref 0.0–0.7)
Eosinophils Relative: 0 % (ref 0–5)
MCH: 26.9 pg (ref 26.0–34.0)
MCHC: 32.1 g/dL (ref 30.0–36.0)
MCV: 83.6 fL (ref 78.0–100.0)
Platelets: 153 10*3/uL (ref 150–400)
RDW: 15.8 % — ABNORMAL HIGH (ref 11.5–15.5)
WBC: 9.2 10*3/uL (ref 4.0–10.5)

## 2012-06-09 LAB — CBC
HCT: 29.6 % — ABNORMAL LOW (ref 36.0–46.0)
Hemoglobin: 9.4 g/dL — ABNORMAL LOW (ref 12.0–15.0)
MCH: 26.8 pg (ref 26.0–34.0)
MCHC: 31.8 g/dL (ref 30.0–36.0)
RBC: 3.51 MIL/uL — ABNORMAL LOW (ref 3.87–5.11)

## 2012-06-09 LAB — CREATININE, SERUM
Creatinine, Ser: 1.14 mg/dL — ABNORMAL HIGH (ref 0.50–1.10)
GFR calc non Af Amer: 55 mL/min — ABNORMAL LOW (ref >90)

## 2012-06-09 MED ORDER — ASPIRIN EC 81 MG PO TBEC
81.0000 mg | DELAYED_RELEASE_TABLET | Freq: Every day | ORAL | Status: DC
  Administered 2012-06-09 – 2012-06-12 (×4): 81 mg via ORAL
  Filled 2012-06-09 (×5): qty 1

## 2012-06-09 MED ORDER — SODIUM CHLORIDE 0.9 % IV SOLN
1250.0000 mg | Freq: Two times a day (BID) | INTRAVENOUS | Status: DC
  Administered 2012-06-10: 1250 mg via INTRAVENOUS
  Filled 2012-06-09 (×2): qty 1250

## 2012-06-09 MED ORDER — SODIUM CHLORIDE 0.9 % IJ SOLN
3.0000 mL | Freq: Two times a day (BID) | INTRAMUSCULAR | Status: DC
  Administered 2012-06-10: 3 mL via INTRAVENOUS

## 2012-06-09 MED ORDER — ONDANSETRON HCL 4 MG PO TABS: 4.0000 mg | ORAL_TABLET | Freq: Four times a day (QID) | ORAL | Status: DC | PRN

## 2012-06-09 MED ORDER — ISOSORBIDE MONONITRATE ER 60 MG PO TB24
60.0000 mg | ORAL_TABLET | Freq: Every day | ORAL | Status: DC
  Administered 2012-06-10 – 2012-06-15 (×6): 60 mg via ORAL
  Filled 2012-06-09 (×6): qty 1

## 2012-06-09 MED ORDER — NITROGLYCERIN 0.3 MG SL SUBL
0.3000 mg | SUBLINGUAL_TABLET | SUBLINGUAL | Status: DC | PRN
  Filled 2012-06-09: qty 100

## 2012-06-09 MED ORDER — ELVITEG-COBIC-EMTRICIT-TENOFDF 150-150-200-300 MG PO TABS
1.0000 | ORAL_TABLET | Freq: Every day | ORAL | Status: DC
  Administered 2012-06-11 – 2012-06-12 (×2): 1 via ORAL
  Filled 2012-06-09 (×5): qty 1

## 2012-06-09 MED ORDER — BISACODYL 5 MG PO TBEC: 10.0000 mg | DELAYED_RELEASE_TABLET | Freq: Every day | ORAL | Status: DC | PRN

## 2012-06-09 MED ORDER — OXYCODONE-ACETAMINOPHEN 5-325 MG PO TABS
1.0000 | ORAL_TABLET | Freq: Once | ORAL | Status: AC
  Administered 2012-06-09: 1 via ORAL
  Filled 2012-06-09: qty 1

## 2012-06-09 MED ORDER — HYDROMORPHONE HCL PF 1 MG/ML IJ SOLN
1.0000 mg | INTRAMUSCULAR | Status: DC | PRN
  Administered 2012-06-09 – 2012-06-15 (×24): 1 mg via INTRAVENOUS
  Filled 2012-06-09 (×19): qty 1
  Filled 2012-06-09: qty 2
  Filled 2012-06-09 (×5): qty 1

## 2012-06-09 MED ORDER — SODIUM CHLORIDE 0.9 % IJ SOLN: 3.0000 mL | INTRAMUSCULAR | Status: DC | PRN

## 2012-06-09 MED ORDER — SODIUM CHLORIDE 0.9 % IV SOLN: 250.0000 mL | INTRAVENOUS | Status: DC | PRN

## 2012-06-09 MED ORDER — IPRATROPIUM BROMIDE 0.02 % IN SOLN
0.5000 mg | Freq: Four times a day (QID) | RESPIRATORY_TRACT | Status: DC
  Administered 2012-06-09 – 2012-06-10 (×3): 0.5 mg via RESPIRATORY_TRACT
  Filled 2012-06-09 (×3): qty 2.5

## 2012-06-09 MED ORDER — LISINOPRIL 20 MG PO TABS
20.0000 mg | ORAL_TABLET | Freq: Every day | ORAL | Status: DC
  Administered 2012-06-10: 20 mg via ORAL
  Filled 2012-06-09: qty 1

## 2012-06-09 MED ORDER — ACETAMINOPHEN 325 MG PO TABS
650.0000 mg | ORAL_TABLET | Freq: Four times a day (QID) | ORAL | Status: DC | PRN
  Administered 2012-06-14: 650 mg via ORAL
  Filled 2012-06-09: qty 2

## 2012-06-09 MED ORDER — IBUPROFEN 400 MG PO TABS
400.0000 mg | ORAL_TABLET | Freq: Four times a day (QID) | ORAL | Status: DC | PRN
  Administered 2012-06-10: 400 mg via ORAL
  Filled 2012-06-09 (×3): qty 1

## 2012-06-09 MED ORDER — VALACYCLOVIR HCL 500 MG PO TABS
500.0000 mg | ORAL_TABLET | Freq: Two times a day (BID) | ORAL | Status: DC | PRN
  Administered 2012-06-09: 500 mg via ORAL
  Filled 2012-06-09: qty 1

## 2012-06-09 MED ORDER — NON FORMULARY: 5.0000 mg | Freq: Every day | Status: DC

## 2012-06-09 MED ORDER — NITROGLYCERIN 0.4 MG SL SUBL: 0.4000 mg | SUBLINGUAL_TABLET | SUBLINGUAL | Status: DC | PRN

## 2012-06-09 MED ORDER — ALBUTEROL SULFATE HFA 108 (90 BASE) MCG/ACT IN AERS
2.0000 | INHALATION_SPRAY | RESPIRATORY_TRACT | Status: DC | PRN
  Filled 2012-06-09: qty 6.7

## 2012-06-09 MED ORDER — ACETAMINOPHEN 650 MG RE SUPP: 650.0000 mg | Freq: Four times a day (QID) | RECTAL | Status: DC | PRN

## 2012-06-09 MED ORDER — OXYCODONE-ACETAMINOPHEN 10-325 MG PO TABS: 1.0000 | ORAL_TABLET | ORAL | Status: DC | PRN

## 2012-06-09 MED ORDER — CARVEDILOL 3.125 MG PO TABS
3.1250 mg | ORAL_TABLET | Freq: Two times a day (BID) | ORAL | Status: DC
  Administered 2012-06-10 – 2012-06-15 (×11): 3.125 mg via ORAL
  Filled 2012-06-09 (×15): qty 1

## 2012-06-09 MED ORDER — ROSUVASTATIN CALCIUM 5 MG PO TABS
5.0000 mg | ORAL_TABLET | Freq: Every day | ORAL | Status: DC
  Administered 2012-06-09: 5 mg via ORAL
  Filled 2012-06-09 (×2): qty 1

## 2012-06-09 MED ORDER — VANCOMYCIN HCL 1000 MG IV SOLR
750.0000 mg | INTRAVENOUS | Status: AC
  Administered 2012-06-09: 750 mg via INTRAVENOUS
  Filled 2012-06-09 (×2): qty 750

## 2012-06-09 MED ORDER — INFLUENZA VIRUS VACC SPLIT PF IM SUSP
0.5000 mL | INTRAMUSCULAR | Status: AC
  Administered 2012-06-10: 0.5 mL via INTRAMUSCULAR
  Filled 2012-06-09: qty 0.5

## 2012-06-09 MED ORDER — OXYCODONE HCL 5 MG PO TABS
5.0000 mg | ORAL_TABLET | ORAL | Status: DC | PRN
  Administered 2012-06-12 – 2012-06-15 (×10): 5 mg via ORAL
  Filled 2012-06-09 (×10): qty 1

## 2012-06-09 MED ORDER — POTASSIUM CHLORIDE CRYS ER 20 MEQ PO TBCR
40.0000 meq | EXTENDED_RELEASE_TABLET | Freq: Once | ORAL | Status: AC
  Administered 2012-06-09: 40 meq via ORAL
  Filled 2012-06-09: qty 2

## 2012-06-09 MED ORDER — GABAPENTIN 400 MG PO CAPS
400.0000 mg | ORAL_CAPSULE | Freq: Three times a day (TID) | ORAL | Status: DC
  Administered 2012-06-09 – 2012-06-12 (×7): 400 mg via ORAL
  Filled 2012-06-09 (×10): qty 1

## 2012-06-09 MED ORDER — PANTOPRAZOLE SODIUM 40 MG PO TBEC
40.0000 mg | DELAYED_RELEASE_TABLET | Freq: Every day | ORAL | Status: DC
  Administered 2012-06-10 – 2012-06-15 (×6): 40 mg via ORAL
  Filled 2012-06-09 (×6): qty 1

## 2012-06-09 MED ORDER — ALBUTEROL SULFATE (5 MG/ML) 0.5% IN NEBU: 2.5000 mg | INHALATION_SOLUTION | RESPIRATORY_TRACT | Status: DC | PRN

## 2012-06-09 MED ORDER — HYDROMORPHONE HCL PF 1 MG/ML IJ SOLN
1.0000 mg | Freq: Once | INTRAMUSCULAR | Status: AC
  Administered 2012-06-09: 1 mg via INTRAVENOUS
  Filled 2012-06-09: qty 1

## 2012-06-09 MED ORDER — TRAZODONE HCL 50 MG PO TABS
50.0000 mg | ORAL_TABLET | Freq: Every day | ORAL | Status: DC
  Administered 2012-06-09 – 2012-06-14 (×5): 50 mg via ORAL
  Filled 2012-06-09 (×7): qty 1

## 2012-06-09 MED ORDER — OXYCODONE-ACETAMINOPHEN 5-325 MG PO TABS
1.0000 | ORAL_TABLET | ORAL | Status: DC | PRN
  Administered 2012-06-12 – 2012-06-15 (×11): 1 via ORAL
  Filled 2012-06-09 (×11): qty 1

## 2012-06-09 MED ORDER — DIPHENOXYLATE-ATROPINE 2.5-0.025 MG PO TABS: 1.0000 | ORAL_TABLET | Freq: Four times a day (QID) | ORAL | Status: DC | PRN

## 2012-06-09 MED ORDER — ONDANSETRON HCL 4 MG/2ML IJ SOLN: 4.0000 mg | Freq: Four times a day (QID) | INTRAMUSCULAR | Status: DC | PRN

## 2012-06-09 MED ORDER — HYDROCHLOROTHIAZIDE 25 MG PO TABS
25.0000 mg | ORAL_TABLET | Freq: Every day | ORAL | Status: DC
  Administered 2012-06-10: 25 mg via ORAL
  Filled 2012-06-09: qty 1

## 2012-06-09 MED ORDER — VANCOMYCIN HCL IN DEXTROSE 1-5 GM/200ML-% IV SOLN
1000.0000 mg | Freq: Once | INTRAVENOUS | Status: AC
  Administered 2012-06-09: 1000 mg via INTRAVENOUS
  Filled 2012-06-09: qty 200

## 2012-06-09 MED ORDER — ENOXAPARIN SODIUM 40 MG/0.4ML ~~LOC~~ SOLN
40.0000 mg | SUBCUTANEOUS | Status: DC
  Administered 2012-06-09 – 2012-06-14 (×6): 40 mg via SUBCUTANEOUS
  Filled 2012-06-09 (×7): qty 0.4

## 2012-06-09 MED ORDER — OXYCODONE HCL 5 MG PO TABS
5.0000 mg | ORAL_TABLET | Freq: Once | ORAL | Status: AC
  Administered 2012-06-09: 5 mg via ORAL
  Filled 2012-06-09: qty 1
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
## 2012-06-09 NOTE — ED Notes (Signed)
Rt leg pain was seen here yesterday dx w/cellulitis of rt ankle. Not any better, temp today more reddness has 20 left hand

## 2012-06-09 NOTE — ED Notes (Signed)
Report given to Amy, RN. To be moved to CDU.

## 2012-06-09 NOTE — ED Notes (Signed)
Called 6N to give report.  Receiving RN unable to take report at this time. 
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
## 2012-06-09 NOTE — Progress Notes (Signed)
ANTIBIOTIC CONSULT NOTE - INITIAL  Pharmacy Consult for vancomycin Indication: cellulitis  Allergies  Allergen Reactions  . Atorvastatin Other (See Comments)    Patient states that the medication affects her memory    Patient Measurements: Wt= 126kg  Vital Signs: Temp: 100.4 F (38 C) (10/04 1301) Temp src: Oral (10/04 1301) BP: 115/80 mmHg (10/04 1301) Pulse Rate: 98  (10/04 1301) Intake/Output from previous day:   Intake/Output from this shift:    Labs:  Basename 06/09/12 1307 06/08/12 1249  WBC 9.2 7.5  HGB 10.5* 10.6*  PLT 153 135*  LABCREA -- --  CREATININE 1.01 0.89   The CrCl is unknown because both a height and weight (above a minimum accepted value) are required for this calculation. No results found for this basename: VANCOTROUGH:2,VANCOPEAK:2,VANCORANDOM:2,GENTTROUGH:2,GENTPEAK:2,GENTRANDOM:2,TOBRATROUGH:2,TOBRAPEAK:2,TOBRARND:2,AMIKACINPEAK:2,AMIKACINTROU:2,AMIKACIN:2, in the last 72 hours   Microbiology: No results found for this or any previous visit (from the past 720 hour(s)).  Medical History: Past Medical History  Diagnosis Date  . Cholecystitis     Gall bladder removed   . Hypercholesterolemia   . Fibroids   . HIV (human immunodeficiency virus infection) 05/27/11  . Pelvic pain in female 10/14/11  . PMB (postmenopausal bleeding)     Since 2010  . Increased BMI 05/27/11  . Anxiety   . Depression   . Hypertension   . H/O varicella   . History of measles, mumps, or rubella   . Blood transfusion   . Hypothyroidism   . CHF (congestive heart failure)   . Myocardial infarction 07/2004  . COPD (chronic obstructive pulmonary disease)   . Pneumonia 1980's    "once"  . Iron deficiency anemia   . Lower GI bleed 04/24/2012    recurrent; "last episode 03/2012)  . Stroke 12/2011    residual "speech messes up when I get sick or real tired" (04/24/2012)  . Arthritis     "knees"    Assessment: 51 yo female with history of HIV and presenting with  right leg cellulitis to start on vancomycin. The first dose of vancomycin was 1000mg  given in the ED. SCr=1.01 and CrCl ~70  Goal of Therapy:  Vancomycin trough level 10-15 mcg/ml  Plan:  -750 mg IV vancomycin now to complete a 1750mg  load followed by 1250mg  IV q12h -Will follow renal function and progress  Harland German, Pharm D 06/09/2012 5:27 PM

## 2012-06-10 DIAGNOSIS — R7989 Other specified abnormal findings of blood chemistry: Secondary | ICD-10-CM | POA: Diagnosis not present

## 2012-06-10 DIAGNOSIS — B009 Herpesviral infection, unspecified: Secondary | ICD-10-CM

## 2012-06-10 LAB — BASIC METABOLIC PANEL
BUN: 20 mg/dL (ref 6–23)
Chloride: 99 mEq/L (ref 96–112)
GFR calc Af Amer: 40 mL/min — ABNORMAL LOW (ref >90)
GFR calc non Af Amer: 35 mL/min — ABNORMAL LOW (ref >90)
Potassium: 4 mEq/L (ref 3.5–5.1)
Sodium: 133 mEq/L — ABNORMAL LOW (ref 135–145)

## 2012-06-10 LAB — CK: Total CK: 95 U/L (ref 7–177)

## 2012-06-10 MED ORDER — ALBUTEROL SULFATE (5 MG/ML) 0.5% IN NEBU: 2.5000 mg | INHALATION_SOLUTION | RESPIRATORY_TRACT | Status: DC | PRN

## 2012-06-10 MED ORDER — DAPTOMYCIN 500 MG IV SOLR
4.0000 mg/kg | INTRAVENOUS | Status: DC
  Administered 2012-06-10 – 2012-06-11 (×2): 322 mg via INTRAVENOUS
  Filled 2012-06-10 (×4): qty 6.44

## 2012-06-10 MED ORDER — SODIUM CHLORIDE 0.9 % IV SOLN
4.0000 mg/kg | INTRAVENOUS | Status: DC
  Filled 2012-06-10: qty 10.09

## 2012-06-10 NOTE — Progress Notes (Signed)
ANTIBIOTIC CONSULT NOTE - INITIAL  Pharmacy Consult for Cubicin Indication: cellulitis with elevated Scr on Vanco  Allergies  Allergen Reactions  . Atorvastatin Other (See Comments)    Patient states that the medication affects her memory    Patient Measurements: Wt= 126kg  Vital Signs: Temp: 97.9 F (36.6 C) (10/05 1400) Temp src: Oral (10/05 1400) BP: 106/85 mmHg (10/05 1400) Pulse Rate: 89  (10/05 1400) Intake/Output from previous day:   Intake/Output from this shift: Total I/O In: 600 [P.O.:600] Out: 801 [Urine:800; Stool:1]  Labs:  [facility] 06/10/12 0820 06/09/12 1907 06/09/12 1307 06/08/12 1249  WBC -- 8.6 9.2 7.5  HGB -- 9.4* 10.5* 10.6*  PLT -- 154 153 135*  LABCREA -- -- -- --  CREATININE 1.66* 1.14* 1.01 --   Estimated Creatinine Clearance: 51 ml/min (by C-G formula based on Cr of 1.66). No results found for this basename: VANCOTROUGH:2,VANCOPEAK:2,VANCORANDOM:2,GENTTROUGH:2,GENTPEAK:2,GENTRANDOM:2,TOBRATROUGH:2,TOBRAPEAK:2,TOBRARND:2,AMIKACINPEAK:2,AMIKACINTROU:2,AMIKACIN:2, in the last 72 hours   Microbiology: Recent Results (from the past 720 hour(s))  CULTURE, BLOOD (ROUTINE X 2)     Status: Normal (Preliminary result)   Collection Time   06/09/12  1:15 PM      Component Value Range Status Comment   Specimen Description BLOOD ARM RIGHT   Final    Special Requests BOTTLES DRAWN AEROBIC ONLY 10CC   Final    Culture  Setup Time 06/09/2012 21:11   Final    Culture     Final    Value:        BLOOD CULTURE RECEIVED NO GROWTH TO DATE CULTURE WILL BE HELD FOR 5 DAYS BEFORE ISSUING A FINAL NEGATIVE REPORT   Report Status PENDING   Incomplete   CULTURE, BLOOD (ROUTINE X 2)     Status: Normal (Preliminary result)   Collection Time   06/09/12  1:18 PM      Component Value Range Status Comment   Specimen Description BLOOD HAND RIGHT   Final    Special Requests BOTTLES DRAWN AEROBIC ONLY 10CC   Final    Culture  Setup Time 06/09/2012 21:11   Final    Culture      Final    Value:        BLOOD CULTURE RECEIVED NO GROWTH TO DATE CULTURE WILL BE HELD FOR 5 DAYS BEFORE ISSUING A FINAL NEGATIVE REPORT   Report Status PENDING   Incomplete     Medical History: Past Medical History  Diagnosis Date  . Cholecystitis     Gall bladder removed   . Hypercholesterolemia   . Fibroids   . HIV (human immunodeficiency virus infection) 05/27/11  . Pelvic pain in female 10/14/11  . PMB (postmenopausal bleeding)     Since 2010  . Increased BMI 05/27/11  . Anxiety   . Depression   . Hypertension   . H/O varicella   . History of measles, mumps, or rubella   . Blood transfusion   . Hypothyroidism   . CHF (congestive heart failure)   . Myocardial infarction 07/2004  . COPD (chronic obstructive pulmonary disease)   . Pneumonia 1980's    "once"  . Iron deficiency anemia   . Lower GI bleed 04/24/2012    recurrent; "last episode 03/2012)  . Stroke 12/2011    residual "speech messes up when I get sick or real tired" (04/24/2012)  . Arthritis     "knees"    Assessment: 51 yo female with history of HIV and worsening right leg cellulitis ( 2 more ED visits this week,  was placed on Bactrim) to change Vancomycin to Cubicin PMH: HTN, COPD, CHF  ID: Tmax 101.2. WBC 8.6. Scr up to 1.14  Now 1.66. Watch. F/u BC x 2  Anticoag: Lovenox 40mg /d (BMI>30)  Cards: HTN, CHF with BP 122/50, HR99 Meds: ASA 81mg , Coreg, HCTZ, Imdur, lisinopril, Crestor  GI: po PPI  Neuro: Neurontin  Renal: Watch Scr 1.14 (up)  Plan:  1. Cubicin 4mg /kg/24h (reduce dose when/if CrCl<30)(using ABW 80 kg)=322mg /24h 2. Serum CPK levels, weekly, more frequently in patients with prior or concomitant therapy with an HMG-CoA reductase inhibitor (pt on Crestor), in patients with renal insufficiency     Evaristo Tsuda S. Merilynn Finland, PharmD, [facility] Clinical Staff Pharmacist Pager [phone]   06/10/2012 2:47 PM

## 2012-06-10 NOTE — Progress Notes (Signed)
RT completed patient protocol assessment.  Patient has a history of COPD and has albuterol nebulizer treatments as needed at home as well as an albuterol inhaler to use as needed.  Patient's lungs are diminished, she is not requiring any oxygen, and chest xray report shows that the lungs are clear.  RT changed patients nebulizer treatments to every four hours as needed per RT protocol.

## 2012-06-10 NOTE — Progress Notes (Addendum)
[facility] PROGRESS NOTE  AVI ARCHULETA ZOX:[id number] DOB: [birth year] DOA: 06/09/2012 PCP: Quitman Livings, MD  Assessment/Plan: Cellulitis of right leg failed oral therapy Follow cultures, pt feels swelling is worsened. We will discontinue vancomycin and start daptomycin due to possible renal involvement. We will d/c statins. CT was reviewed.   Mild elevation of creatinine : we will d/c vancomycin, statins. Check CPK, urine eosinophils. False elevation of creatinine could be due to Stribild also. Monitor for nephrotoxicity and drug interactions. We will d/c lisinopril and HCTZ also.   HUMAN IMMUNODEFICIENCY VIRUS [HIV] VL <50 and CD4 in good range. Consider changing stribild to Truvada, Prezista and Norvir if creatinine continues to rise.   HYPERTENSION  -Continue outpatient medications, monitor and further treat accordingly   COPD  -Stable, continue bronchodilators   Chronic systolic CHF (congestive heart failure)  -Compensated, continue outpatient medications   Hypokalemia  -Replace K.   Code Status: full  Family Communication: none available  Disposition Plan: will change to inpt status.   Antibiotics:  Anti-infectives     Start     Dose/Rate Route Frequency Ordered Stop   06/10/12 0800   elvitegravir-cobicistat-emtricitabine-tenofovir (STRIBILD) 150-150-200-300 MG tablet 1 tablet        1 tablet Oral Daily with breakfast 06/09/12 1813     06/10/12 0700  vancomycin (VANCOCIN) 1,250 mg in sodium chloride 0.9 % 250 mL IVPB       1,250 mg 166.7 mL/hr over 90 Minutes Intravenous Every 12 hours 06/09/12 1813     06/09/12 1813  valACYclovir (VALTREX) tablet 500 mg       500 mg Oral 2 times daily PRN 06/09/12 1813     06/09/12 1730  vancomycin (VANCOCIN) 750 mg in sodium chloride 0.9 % 150 mL IVPB       750 mg 150 mL/hr over 60 Minutes Intravenous NOW 06/09/12 1728 06/09/12 2232   06/09/12 1445  vancomycin (VANCOCIN) IVPB 1000 mg/200 mL premix       1,000 mg 200  mL/hr over 60 Minutes Intravenous  Once 06/09/12 1433 06/09/12 1607          HPI/Subjective: Pt feels swelling is worse or same but no better. Pain is intermittent. Taking her meds regularly.   Objective: Filed Vitals:   06/10/12 0601 06/10/12 0801 06/10/12 1005 06/10/12 1400  BP: 122/50  112/63 106/85  Pulse: 99   89  Temp: 98.1 F (36.7 C)   97.9 F (36.6 C)  TempSrc: Oral   Oral  Resp: 16   18  Height:      Weight:      SpO2: 97% 95%  98%   No intake or output data in the 24 hours ending 06/10/12 1420 Filed Weights   06/09/12 1820  Weight: 126.1 kg (278 lb)    Exam: Constitutional: A, O x 3, NAD. Obese. NAD.   Head: NT, AC Mouth: OMM, no thrush Eyes: PERRL, EOMI, conjunctivae normal, No scleral icterus.  Neck: Supple, Trachea midline normal ROM, No JVD, mass, thyromegaly, or carotid bruit present.  Cardiovascular: RRR, S1 normal, S2 normal Pulmonary/Chest: CTAB, no wheezes, rales, or rhonchi  Abdominal: Soft. Non-tender, non-distended, bowel sounds are normal, Extremities: Right foot and ankle area with inflammation noted. Mild swelling and warmth noted. Minimally tender and red.  Left lower extremity within normal limits.   Data Reviewed: Basic Metabolic Panel:  Lab 06/10/12 8119 06/09/12 1907 06/09/12 1307 06/08/12 1249  NA 133* -- 138 134*  K 4.0 -- 3.4* 3.8  CL  99 -- 102 99  CO2 27 -- 26 26  GLUCOSE 110* -- 100* 103*  BUN 20 -- 12 12  CREATININE 1.66* 1.14* 1.01 0.89  CALCIUM 9.1 -- 10.0 9.5  MG -- -- -- --  PHOS -- -- -- --   Liver Function Tests:  Lab 06/09/12 1307  AST 34  ALT 28  ALKPHOS 101  BILITOT 0.6  PROT 7.3  ALBUMIN 3.0*   No results found for this basename: LIPASE:5,AMYLASE:5 in the last 168 hours No results found for this basename: AMMONIA:5 in the last 168 hours CBC:  Lab 06/09/12 1907 06/09/12 1307 06/08/12 1249  WBC 8.6 9.2 7.5  NEUTROABS -- 7.0 6.1  HGB 9.4* 10.5* 10.6*  HCT 29.6* 32.7* 33.1*  MCV 84.3 83.6 84.7  PLT  154 153 135*   Cardiac Enzymes:  Lab 06/09/12 1500  CKTOTAL 77  CKMB --  CKMBINDEX --  TROPONINI --   BNP (last 3 results)  Basename 04/23/12 2207 03/20/12 2011 01/31/12 1237  PROBNP 211.9* 311.5* 178.7*   CBG: No results found for this basename: GLUCAP:5 in the last 168 hours  No results found for this or any previous visit (from the past 240 hour(s)).   Studies: Dg Chest 2 View  06/09/2012  *RADIOLOGY REPORT*  Clinical Data: Leg pain.  Swelling in right foot.  CHEST - 2 VIEW  Comparison: Chest radiograph 06/07/2012 and 04/04/2013and CT chest 12/22/2010  Findings: Mild cardiomegaly is stable. Left coronary artery stent is visible.  Pulmonary vascularity is within normal limits.  The lungs are clear.  There is no pleural effusion.  The trachea is midline.  Schmorl's node in the inferior endplate of the T7 vertebral body is stable compared to prior chest CT; no acute bony abnormality is identified.  IMPRESSION:  1.  Stable mild cardiomegaly.  No acute findings. 2.  Coronary artery stent.   Original Report Authenticated By: Britta Mccreedy, M.D.    Ct Ankle Right Wo Contrast  06/09/2012  *RADIOLOGY REPORT*  Clinical Data:  Right foot and ankle pain and swelling.  Clinical concern for occult fracture.  CT RIGHT FOOT WITHOUT CONTRAST  Technique:  Multidetector CT imaging of the right foot was performed according to the standard protocol without intravenous contrast. Multiplanar CT image reconstructions were also generated.  Comparison:  Right foot radiographs dated 06/06/2012.  Findings:  Diffuse subcutaneous edema, most pronounced dorsally. No bone destruction or periosteal reaction.  Small cystic areas in the talus and lateral malleolus.  Inferior calcaneal spur.  IMPRESSION:  1.  No fracture or CT evidence of osteomyelitis. 2.  Diffuse subcutaneous edema, most pronounced dorsally. 3.  Small talar and lateral malleolus cysts. 4.  Inferior calcaneal spur.  CT RIGHT ANKLE WITHOUT CONTRAST   Technique:  Multidetector CT imaging of the right ankle was performed according to the standard protocol without intravenous contrast. Multiplanar CT image reconstructions were also generated.  Comparison:  Right ankle radiographs dated 12/09/2008.  Findings:  Diffuse subcutaneous edema, most pronounced anteriorly, laterally and medially.  Inferior calcaneal spur.  Small cysts in the talus and lateral malleolus.  No bone destruction or periosteal reaction.  No fracture or dislocation seen.  IMPRESSION:  1.  No fracture or CT evidence of osteomyelitis. 2.  Diffuse subcutaneous edema. 3.  Small talar and lateral malleolar cysts. 4.  Inferior calcaneal spur.   Original Report Authenticated By: Darrol Angel, M.D.    Ct Foot Right Wo Contrast  06/09/2012  *RADIOLOGY REPORT*  Clinical Data:  Right foot and ankle pain and swelling.  Clinical concern for occult fracture.  CT RIGHT FOOT WITHOUT CONTRAST  Technique:  Multidetector CT imaging of the right foot was performed according to the standard protocol without intravenous contrast. Multiplanar CT image reconstructions were also generated.  Comparison:  Right foot radiographs dated 06/06/2012.  Findings:  Diffuse subcutaneous edema, most pronounced dorsally. No bone destruction or periosteal reaction.  Small cystic areas in the talus and lateral malleolus.  Inferior calcaneal spur.  IMPRESSION:  1.  No fracture or CT evidence of osteomyelitis. 2.  Diffuse subcutaneous edema, most pronounced dorsally. 3.  Small talar and lateral malleolus cysts. 4.  Inferior calcaneal spur.  CT RIGHT ANKLE WITHOUT CONTRAST  Technique:  Multidetector CT imaging of the right ankle was performed according to the standard protocol without intravenous contrast. Multiplanar CT image reconstructions were also generated.  Comparison:  Right ankle radiographs dated 12/09/2008.  Findings:  Diffuse subcutaneous edema, most pronounced anteriorly, laterally and medially.  Inferior calcaneal spur.   Small cysts in the talus and lateral malleolus.  No bone destruction or periosteal reaction.  No fracture or dislocation seen.  IMPRESSION:  1.  No fracture or CT evidence of osteomyelitis. 2.  Diffuse subcutaneous edema. 3.  Small talar and lateral malleolar cysts. 4.  Inferior calcaneal spur.   Original Report Authenticated By: Darrol Angel, M.D.     Scheduled Meds:   . aspirin EC  81 mg Oral Daily  . carvedilol  3.125 mg Oral BID WC  . elvitegravir-cobicistat-emtricitabine-tenofovir  1 tablet Oral Q breakfast  . enoxaparin (LOVENOX) injection  40 mg Subcutaneous Q24H  . gabapentin  400 mg Oral TID  . hydrochlorothiazide  25 mg Oral Daily  .  HYDROmorphone (DILAUDID) injection  1 mg Intravenous Once  . influenza  inactive virus vaccine  0.5 mL Intramuscular Tomorrow-1000  . isosorbide mononitrate  60 mg Oral Daily  . lisinopril  20 mg Oral Daily  . oxyCODONE-acetaminophen  1 tablet Oral Once   And  . oxyCODONE  5 mg Oral Once  . pantoprazole  40 mg Oral Daily  . potassium chloride  40 mEq Oral Once  . rosuvastatin  5 mg Oral q1800  . sodium chloride  3 mL Intravenous Q12H  . traZODone  50 mg Oral QHS  . vancomycin  1,250 mg Intravenous Q12H  . vancomycin  750 mg Intravenous NOW  . vancomycin  1,000 mg Intravenous Once  . DISCONTD: ipratropium  0.5 mg Nebulization Q6H  . DISCONTD: NON FORMULARY 5 mg  5 mg Oral QHS   Continuous Infusions:   Active Problems:  HUMAN IMMUNODEFICIENCY VIRUS [HIV]  HYPERTENSION  COPD  Chronic systolic CHF (congestive heart failure)  Hypokalemia  Cellulitis of right leg    Time spent: 30 min   Hessie Varone V.  [facility] Pager [phone]. If 8PM-8AM, please contact night-coverage at [email or web address], password [facility] 06/10/2012, 2:20 PM  LOS: 1 day

## 2012-06-11 DIAGNOSIS — R799 Abnormal finding of blood chemistry, unspecified: Secondary | ICD-10-CM

## 2012-06-11 LAB — URINE MICROSCOPIC-ADD ON

## 2012-06-11 LAB — URINALYSIS, ROUTINE W REFLEX MICROSCOPIC
Nitrite: NEGATIVE
Protein, ur: NEGATIVE mg/dL
Specific Gravity, Urine: 1.015 (ref 1.005–1.030)
Urobilinogen, UA: 0.2 mg/dL (ref 0.0–1.0)

## 2012-06-11 LAB — NA AND K (SODIUM & POTASSIUM), RAND UR
Potassium Urine: 31 mEq/L
Sodium, Ur: 30 mEq/L

## 2012-06-11 MED ORDER — SODIUM CHLORIDE 0.9 % IV SOLN
INTRAVENOUS | Status: AC
  Administered 2012-06-11: 17:00:00 via INTRAVENOUS

## 2012-06-11 NOTE — Progress Notes (Signed)
[facility] PROGRESS NOTE  HAYDYN LIDDELL ZOX:[id number] DOB: [birth year] DOA: 06/09/2012 PCP: Quitman Livings, MD  Assessment/Plan: Cellulitis of right leg failed oral therapy  Follow cultures, pt feels swelling is about the same.  We will continue  daptomycin due to possible renal involvement from vanc. We d/ced statins, lisinopril, HCTZ, Motrin. CT was reviewed.   Mild elevation of creatinine : we d/ced vancomycin, statins and other meds as above. Will order renal US. Discussed case with Dr. Kathrene Bongo and they will see patient in am. CPK wnl. Urine eos pending. False elevation of creatinine (without affecting GFR) could be due to Stribild also. Monitor for nephrotoxicity and drug interactions.  Will start IV hydration today.   HUMAN IMMUNODEFICIENCY VIRUS [HIV] VL <50 and CD4 in good range. Consider changing stribild to Truvada, Prezista and Norvir if creatinine continues to rise and nephrology agrees.    HYPERTENSION  -Continue outpatient medications, monitor and further treat accordingly   COPD  -Stable, continue bronchodilators   Chronic systolic CHF (congestive heart failure)  -Compensated, continue outpatient medications   Hypokalemia  -resolved    Code Status: full Family Communication: none Disposition Plan: pending recovery, likely home   Consultants:  none  Procedures:  As above  Antibiotics: dapto 10/05 vanc prior to that  HPI/Subjective: She has same amount of pain on movement as yesterday. She feels swelling is little reduced and no other issues. No fever. Tolerating dapto well. She feels she is passing less urine.   Objective: Filed Vitals:   06/10/12 1005 06/10/12 1400 06/10/12 2235 06/11/12 0500  BP: 112/63 106/85 110/81 111/76  Pulse:  89 92 90  Temp:  97.9 F (36.6 C) 98.2 F (36.8 C) 98.6 F (37 C)  TempSrc:  Oral    Resp:  18 18 18   Height:      Weight:      SpO2:  98% 97% 98%    Intake/Output Summary (Last 24 hours) at 06/11/12  1042 Last data filed at 06/10/12 1300  Gross per 24 hour  Intake    360 ml  Output    401 ml  Net    -41 ml   Filed Weights   06/09/12 1820  Weight: 126.1 kg (278 lb)    Exam: Constitutional: A, O x 3, NAD. Obese. Lying in bed.  Head: NT, AC  Mouth: OMM, no thrush  Eyes: PERRL, EOMI, conjunctivae normal, No scleral icterus.  Cardiovascular: RRR, S1 normal, S2 normal  Pulmonary/Chest: CTAB, no wheezes, rales, or rhonchi  Abdominal: Soft. Non-tender, non-distended, bowel sounds are normal,  Extremities: Right foot and ankle area with inflammation noted. Mild swelling and warmth noted. Minimally tender and red. Swelling is same as yesterday. Leg elevated on 2 pillows today. Left lower extremity within normal limits.     Data Reviewed: Basic Metabolic Panel:  Lab 06/10/12 8119 06/09/12 1907 06/09/12 1307 06/08/12 1249  NA 133* -- 138 134*  K 4.0 -- 3.4* 3.8  CL 99 -- 102 99  CO2 27 -- 26 26  GLUCOSE 110* -- 100* 103*  BUN 20 -- 12 12  CREATININE 1.66* 1.14* 1.01 0.89  CALCIUM 9.1 -- 10.0 9.5  MG -- -- -- --  PHOS -- -- -- --   Liver Function Tests:  Lab 06/09/12 1307  AST 34  ALT 28  ALKPHOS 101  BILITOT 0.6  PROT 7.3  ALBUMIN 3.0*   No results found for this basename: LIPASE:5,AMYLASE:5 in the last 168 hours No results found for this  basename: AMMONIA:5 in the last 168 hours CBC:  Lab 06/09/12 1907 06/09/12 1307 06/08/12 1249  WBC 8.6 9.2 7.5  NEUTROABS -- 7.0 6.1  HGB 9.4* 10.5* 10.6*  HCT 29.6* 32.7* 33.1*  MCV 84.3 83.6 84.7  PLT 154 153 135*   Cardiac Enzymes:  Lab 06/10/12 1500 06/09/12 1500  CKTOTAL 95 77  CKMB -- --  CKMBINDEX -- --  TROPONINI -- --   BNP (last 3 results)  Basename 04/23/12 2207 03/20/12 2011 01/31/12 1237  PROBNP 211.9* 311.5* 178.7*   CBG: No results found for this basename: GLUCAP:5 in the last 168 hours  Recent Results (from the past 240 hour(s))  CULTURE, BLOOD (ROUTINE X 2)     Status: Normal (Preliminary result)    Collection Time   06/09/12  1:15 PM      Component Value Range Status Comment   Specimen Description BLOOD ARM RIGHT   Final    Special Requests BOTTLES DRAWN AEROBIC ONLY 10CC   Final    Culture  Setup Time 06/09/2012 21:11   Final    Culture     Final    Value:        BLOOD CULTURE RECEIVED NO GROWTH TO DATE CULTURE WILL BE HELD FOR 5 DAYS BEFORE ISSUING A FINAL NEGATIVE REPORT   Report Status PENDING   Incomplete   CULTURE, BLOOD (ROUTINE X 2)     Status: Normal (Preliminary result)   Collection Time   06/09/12  1:18 PM      Component Value Range Status Comment   Specimen Description BLOOD HAND RIGHT   Final    Special Requests BOTTLES DRAWN AEROBIC ONLY 10CC   Final    Culture  Setup Time 06/09/2012 21:11   Final    Culture     Final    Value:        BLOOD CULTURE RECEIVED NO GROWTH TO DATE CULTURE WILL BE HELD FOR 5 DAYS BEFORE ISSUING A FINAL NEGATIVE REPORT   Report Status PENDING   Incomplete      Studies: Dg Chest 2 View  06/09/2012  *RADIOLOGY REPORT*  Clinical Data: Leg pain.  Swelling in right foot.  CHEST - 2 VIEW  Comparison: Chest radiograph 06/07/2012 and 04/04/2013and CT chest 12/22/2010  Findings: Mild cardiomegaly is stable. Left coronary artery stent is visible.  Pulmonary vascularity is within normal limits.  The lungs are clear.  There is no pleural effusion.  The trachea is midline.  Schmorl's node in the inferior endplate of the T7 vertebral body is stable compared to prior chest CT; no acute bony abnormality is identified.  IMPRESSION:  1.  Stable mild cardiomegaly.  No acute findings. 2.  Coronary artery stent.   Original Report Authenticated By: Britta Mccreedy, M.D.    Ct Ankle Right Wo Contrast  06/09/2012  *RADIOLOGY REPORT*  Clinical Data:  Right foot and ankle pain and swelling.  Clinical concern for occult fracture.  CT RIGHT FOOT WITHOUT CONTRAST  Technique:  Multidetector CT imaging of the right foot was performed according to the standard protocol without  intravenous contrast. Multiplanar CT image reconstructions were also generated.  Comparison:  Right foot radiographs dated 06/06/2012.  Findings:  Diffuse subcutaneous edema, most pronounced dorsally. No bone destruction or periosteal reaction.  Small cystic areas in the talus and lateral malleolus.  Inferior calcaneal spur.  IMPRESSION:  1.  No fracture or CT evidence of osteomyelitis. 2.  Diffuse subcutaneous edema, most pronounced dorsally. 3.  Small  talar and lateral malleolus cysts. 4.  Inferior calcaneal spur.  CT RIGHT ANKLE WITHOUT CONTRAST  Technique:  Multidetector CT imaging of the right ankle was performed according to the standard protocol without intravenous contrast. Multiplanar CT image reconstructions were also generated.  Comparison:  Right ankle radiographs dated 12/09/2008.  Findings:  Diffuse subcutaneous edema, most pronounced anteriorly, laterally and medially.  Inferior calcaneal spur.  Small cysts in the talus and lateral malleolus.  No bone destruction or periosteal reaction.  No fracture or dislocation seen.  IMPRESSION:  1.  No fracture or CT evidence of osteomyelitis. 2.  Diffuse subcutaneous edema. 3.  Small talar and lateral malleolar cysts. 4.  Inferior calcaneal spur.   Original Report Authenticated By: Darrol Angel, M.D.    Ct Foot Right Wo Contrast  06/09/2012  *RADIOLOGY REPORT*  Clinical Data:  Right foot and ankle pain and swelling.  Clinical concern for occult fracture.  CT RIGHT FOOT WITHOUT CONTRAST  Technique:  Multidetector CT imaging of the right foot was performed according to the standard protocol without intravenous contrast. Multiplanar CT image reconstructions were also generated.  Comparison:  Right foot radiographs dated 06/06/2012.  Findings:  Diffuse subcutaneous edema, most pronounced dorsally. No bone destruction or periosteal reaction.  Small cystic areas in the talus and lateral malleolus.  Inferior calcaneal spur.  IMPRESSION:  1.  No fracture or CT  evidence of osteomyelitis. 2.  Diffuse subcutaneous edema, most pronounced dorsally. 3.  Small talar and lateral malleolus cysts. 4.  Inferior calcaneal spur.  CT RIGHT ANKLE WITHOUT CONTRAST  Technique:  Multidetector CT imaging of the right ankle was performed according to the standard protocol without intravenous contrast. Multiplanar CT image reconstructions were also generated.  Comparison:  Right ankle radiographs dated 12/09/2008.  Findings:  Diffuse subcutaneous edema, most pronounced anteriorly, laterally and medially.  Inferior calcaneal spur.  Small cysts in the talus and lateral malleolus.  No bone destruction or periosteal reaction.  No fracture or dislocation seen.  IMPRESSION:  1.  No fracture or CT evidence of osteomyelitis. 2.  Diffuse subcutaneous edema. 3.  Small talar and lateral malleolar cysts. 4.  Inferior calcaneal spur.   Original Report Authenticated By: Darrol Angel, M.D.     Scheduled Meds:   . aspirin EC  81 mg Oral Daily  . carvedilol  3.125 mg Oral BID WC  . DAPTOmycin (CUBICIN)  IV  4 mg/kg (Adjusted) Intravenous Q24H  . elvitegravir-cobicistat-emtricitabine-tenofovir  1 tablet Oral Q breakfast  . enoxaparin (LOVENOX) injection  40 mg Subcutaneous Q24H  . gabapentin  400 mg Oral TID  . isosorbide mononitrate  60 mg Oral Daily  . pantoprazole  40 mg Oral Daily  . sodium chloride  3 mL Intravenous Q12H  . traZODone  50 mg Oral QHS  . DISCONTD: DAPTOmycin (CUBICIN)  IV  4 mg/kg Intravenous Q24H  . DISCONTD: hydrochlorothiazide  25 mg Oral Daily  . DISCONTD: lisinopril  20 mg Oral Daily  . DISCONTD: rosuvastatin  5 mg Oral q1800  . DISCONTD: vancomycin  1,250 mg Intravenous Q12H   Continuous Infusions:   Active Problems:  HUMAN IMMUNODEFICIENCY VIRUS [HIV]  HYPERTENSION  COPD  Chronic systolic CHF (congestive heart failure)  Hypokalemia  Cellulitis of right leg  Creatinine elevation    Time spent: 30 min    Aubriella Perezgarcia V.  [facility] Pager  [phone]. If 8PM-8AM, please contact night-coverage at [email or web address], password [email or web address] 06/11/2012, 10:42 AM  LOS: 2 days

## 2012-06-12 ENCOUNTER — Observation Stay (HOSPITAL_COMMUNITY): Payer: Medicare PPO

## 2012-06-12 DIAGNOSIS — M79609 Pain in unspecified limb: Secondary | ICD-10-CM

## 2012-06-12 LAB — CBC WITH DIFFERENTIAL/PLATELET
Basophils Absolute: 0 10*3/uL (ref 0.0–0.1)
Eosinophils Relative: 1 % (ref 0–5)
HCT: 28.2 % — ABNORMAL LOW (ref 36.0–46.0)
Hemoglobin: 8.9 g/dL — ABNORMAL LOW (ref 12.0–15.0)
Lymphocytes Relative: 16 % (ref 12–46)
MCHC: 31.6 g/dL (ref 30.0–36.0)
MCV: 85.5 fL (ref 78.0–100.0)
Monocytes Absolute: 1.1 10*3/uL — ABNORMAL HIGH (ref 0.1–1.0)
Monocytes Relative: 12 % (ref 3–12)
Neutro Abs: 6.5 10*3/uL (ref 1.7–7.7)
RDW: 16.7 % — ABNORMAL HIGH (ref 11.5–15.5)
WBC: 9.1 10*3/uL (ref 4.0–10.5)

## 2012-06-12 LAB — COMPREHENSIVE METABOLIC PANEL
AST: 23 U/L (ref 0–37)
BUN: 45 mg/dL — ABNORMAL HIGH (ref 6–23)
CO2: 21 mEq/L (ref 19–32)
Calcium: 9.3 mg/dL (ref 8.4–10.5)
Chloride: 102 mEq/L (ref 96–112)
Creatinine, Ser: 3.76 mg/dL — ABNORMAL HIGH (ref 0.50–1.10)
GFR calc Af Amer: 15 mL/min — ABNORMAL LOW (ref >90)
GFR calc non Af Amer: 13 mL/min — ABNORMAL LOW (ref >90)
Total Bilirubin: 0.2 mg/dL — ABNORMAL LOW (ref 0.3–1.2)

## 2012-06-12 LAB — CREATININE, URINE, RANDOM: Creatinine, Urine: 42.91 mg/dL

## 2012-06-12 LAB — SODIUM, URINE, RANDOM: Sodium, Ur: 48 mEq/L

## 2012-06-12 LAB — CK: Total CK: 625 U/L — ABNORMAL HIGH (ref 7–177)

## 2012-06-12 MED ORDER — GABAPENTIN 300 MG PO CAPS
300.0000 mg | ORAL_CAPSULE | Freq: Three times a day (TID) | ORAL | Status: DC
  Filled 2012-06-12 (×2): qty 1

## 2012-06-12 MED ORDER — GABAPENTIN 100 MG PO CAPS
100.0000 mg | ORAL_CAPSULE | Freq: Three times a day (TID) | ORAL | Status: DC
  Administered 2012-06-12 – 2012-06-15 (×10): 100 mg via ORAL
  Filled 2012-06-12 (×11): qty 1

## 2012-06-12 MED ORDER — DAPTOMYCIN 500 MG IV SOLR
500.0000 mg | INTRAVENOUS | Status: DC
  Administered 2012-06-12: 500 mg via INTRAVENOUS
  Filled 2012-06-12 (×2): qty 10

## 2012-06-12 NOTE — Progress Notes (Signed)
[facility] PROGRESS NOTE  [name] GEX:[id number] DOB: 02/07/1961 DOA: 06/09/2012 PCP: Quitman Livings, MD  Assessment/Plan: Active Problems:  HUMAN IMMUNODEFICIENCY VIRUS [HIV]  HYPERTENSION  COPD  Chronic systolic CHF (congestive heart failure)  Hypokalemia  Cellulitis of right leg  Creatinine elevation Acute kidney injury     Cellulitis of right leg failed oral therapy  Follow cultures, pt feels swelling is about the same. We will continue daptomycin due to possible renal involvement from vanc. We d/ced statins, lisinopril, HCTZ, Motrin. CT was reviewed.    Acute kidney injury : Creatinine has increased to 3.76 to 1.01. AIN due to vancomycin? d/ced vancomycin, statins and other meds as above. Renal ultrasound shows no hydronephrosis. Discussed case with Dr. Briant Cedar and they will see patient today. CPK wnl. Urine eos pending. False elevation of creatinine (without affecting GFR) could be due to Stribild also. Monitor for nephrotoxicity and drug interactions. Continue IV fluids, urine sodium urine creatinine.   HUMAN IMMUNODEFICIENCY VIRUS [HIV] VL <50 and CD4 in good range. Consider changing stribild to Truvada, Prezista and Norvir if creatinine continues to rise and nephrology agrees.  HYPERTENSION  -Continue outpatient medications, monitor and further treat accordingly  COPD  -Stable, continue bronchodilators  Chronic systolic CHF (congestive heart failure)  -Compensated, continue outpatient medications  Hypokalemia  -resolved  Code Status: full  Family Communication: none  Disposition Plan: Not stable for discharge  Consultants:  Nephrology Procedures:  As above Antibiotics:  dapto 10/05  vanc prior to that    HPI/Subjective:  She has same amount of pain on movement as yesterday. She feels swelling is little reduced and no other issues. No fever. Tolerating dapto well. She feels she is passing less urine.   Objective: Objective: Filed Vitals:   06/11/12 1502 06/11/12 2022 06/12/12 0202 06/12/12 1015  BP: 108/75 118/65 110/59   Pulse: 88 98 90 92  Temp: 98.7 F (37.1 C) 98.6 F (37 C) 98.4 F (36.9 C) 98 F (36.7 C)  TempSrc: Oral Oral  Oral  Resp: 16 20 19 20   Height:      Weight:      SpO2: 99% 98% 100% 100%    Intake/Output Summary (Last 24 hours) at 06/12/12 1030 Last data filed at 06/12/12 0600  Gross per 24 hour  Intake   1560 ml  Output    300 ml  Net   1260 ml    Exam:  Constitutional: A, O x 3, NAD. Obese. Lying in bed.  Head: NT, AC  Mouth: OMM, no thrush  Eyes: PERRL, EOMI, conjunctivae normal, No scleral icterus.  Cardiovascular: RRR, S1 normal, S2 normal  Pulmonary/Chest: CTAB, no wheezes, rales, or rhonchi  Abdominal: Soft. Non-tender, non-distended, bowel sounds are normal,  Extremities: Right foot and ankle area with inflammation noted. Mild swelling and warmth noted. Minimally tender and red. Swelling is same as yesterday. Leg elevated on 2 pillows today. Left lower extremity within normal limits    Data Reviewed: Basic Metabolic Panel:  Lab 06/12/12 0102 06/10/12 0820 06/09/12 1907 06/09/12 1307 06/08/12 1249  NA 135 133* -- 138 134*  K 4.6 4.0 -- 3.4* 3.8  CL 102 99 -- 102 99  CO2 21 27 -- 26 26  GLUCOSE 96 110* -- 100* 103*  BUN 45* 20 -- 12 12  CREATININE 3.76* 1.66* 1.14* 1.01 0.89  CALCIUM 9.3 9.1 -- 10.0 9.5  MG -- -- -- -- --  PHOS -- -- -- -- --    Liver Function Tests:  Lab 06/12/12 0650 06/09/12 1307  AST 23 34  ALT 19 28  ALKPHOS 92 101  BILITOT 0.2* 0.6  PROT 6.8 7.3  ALBUMIN 2.5* 3.0*   No results found for this basename: LIPASE:5,AMYLASE:5 in the last 168 hours No results found for this basename: AMMONIA:5 in the last 168 hours  CBC:  Lab 06/12/12 0650 06/09/12 1907 06/09/12 1307 06/08/12 1249  WBC 9.1 8.6 9.2 7.5  NEUTROABS 6.5 -- 7.0 6.1  HGB 8.9* 9.4* 10.5* 10.6*  HCT 28.2* 29.6* 32.7* 33.1*  MCV 85.5 84.3 83.6 84.7  PLT 256 154 153 135*    Cardiac  Enzymes:  Lab 06/10/12 1500 06/09/12 1500  CKTOTAL 95 77  CKMB -- --  CKMBINDEX -- --  TROPONINI -- --   BNP (last 3 results)  Basename 04/23/12 2207 03/20/12 2011 01/31/12 1237  PROBNP 211.9* 311.5* 178.7*     CBG: No results found for this basename: GLUCAP:5 in the last 168 hours  Recent Results (from the past 240 hour(s))  CULTURE, BLOOD (ROUTINE X 2)     Status: Normal (Preliminary result)   Collection Time   06/09/12  1:15 PM      Component Value Range Status Comment   Specimen Description BLOOD ARM RIGHT   Final    Special Requests BOTTLES DRAWN AEROBIC ONLY 10CC   Final    Culture  Setup Time 06/09/2012 21:11   Final    Culture     Final    Value:        BLOOD CULTURE RECEIVED NO GROWTH TO DATE CULTURE WILL BE HELD FOR 5 DAYS BEFORE ISSUING A FINAL NEGATIVE REPORT   Report Status PENDING   Incomplete   CULTURE, BLOOD (ROUTINE X 2)     Status: Normal (Preliminary result)   Collection Time   06/09/12  1:18 PM      Component Value Range Status Comment   Specimen Description BLOOD HAND RIGHT   Final    Special Requests BOTTLES DRAWN AEROBIC ONLY 10CC   Final    Culture  Setup Time 06/09/2012 21:11   Final    Culture     Final    Value:        BLOOD CULTURE RECEIVED NO GROWTH TO DATE CULTURE WILL BE HELD FOR 5 DAYS BEFORE ISSUING A FINAL NEGATIVE REPORT   Report Status PENDING   Incomplete      Studies: Dg Chest 2 View  06/09/2012  *RADIOLOGY REPORT*  Clinical Data: Leg pain.  Swelling in right foot.  CHEST - 2 VIEW  Comparison: Chest radiograph 06/07/2012 and 04/04/2013and CT chest 12/22/2010  Findings: Mild cardiomegaly is stable. Left coronary artery stent is visible.  Pulmonary vascularity is within normal limits.  The lungs are clear.  There is no pleural effusion.  The trachea is midline.  Schmorl's node in the inferior endplate of the T7 vertebral body is stable compared to prior chest CT; no acute bony abnormality is identified.  IMPRESSION:  1.  Stable mild  cardiomegaly.  No acute findings. 2.  Coronary artery stent.   Original Report Authenticated By: Britta Mccreedy, M.D.    Dg Chest 2 View  06/07/2012  *RADIOLOGY REPORT*  Clinical Data: Cough and congestion.  Shortness of breath.  CHEST - 2 VIEW  Comparison: 04/23/2012  Findings: Lung volumes are low.  No edema or focal airspace consolidation. Cardiopericardial silhouette is at upper limits of normal for size. Imaged bony structures of the thorax are intact.  IMPRESSION:  No acute cardiopulmonary findings.   Original Report Authenticated By: ERIC A. MANSELL, M.D.    Ct Ankle Right Wo Contrast  06/09/2012  *RADIOLOGY REPORT*  Clinical Data:  Right foot and ankle pain and swelling.  Clinical concern for occult fracture.  CT RIGHT FOOT WITHOUT CONTRAST  Technique:  Multidetector CT imaging of the right foot was performed according to the standard protocol without intravenous contrast. Multiplanar CT image reconstructions were also generated.  Comparison:  Right foot radiographs dated 06/06/2012.  Findings:  Diffuse subcutaneous edema, most pronounced dorsally. No bone destruction or periosteal reaction.  Small cystic areas in the talus and lateral malleolus.  Inferior calcaneal spur.  IMPRESSION:  1.  No fracture or CT evidence of osteomyelitis. 2.  Diffuse subcutaneous edema, most pronounced dorsally. 3.  Small talar and lateral malleolus cysts. 4.  Inferior calcaneal spur.  CT RIGHT ANKLE WITHOUT CONTRAST  Technique:  Multidetector CT imaging of the right ankle was performed according to the standard protocol without intravenous contrast. Multiplanar CT image reconstructions were also generated.  Comparison:  Right ankle radiographs dated 12/09/2008.  Findings:  Diffuse subcutaneous edema, most pronounced anteriorly, laterally and medially.  Inferior calcaneal spur.  Small cysts in the talus and lateral malleolus.  No bone destruction or periosteal reaction.  No fracture or dislocation seen.  IMPRESSION:  1.  No  fracture or CT evidence of osteomyelitis. 2.  Diffuse subcutaneous edema. 3.  Small talar and lateral malleolar cysts. 4.  Inferior calcaneal spur.   Original Report Authenticated By: Darrol Angel, M.D.    US Renal  06/12/2012  *RADIOLOGY REPORT*  Clinical Data: Acute renal failure.  RENAL/URINARY TRACT ULTRASOUND COMPLETE  Comparison:  CT of 03/11/2012  Findings:  Right Kidney:  12.1 cm. No hydronephrosis.  Normal renal cortical thickness and echogenicity.  Right renal 1.6 cm lesion is technically indeterminate but most consistent with a cyst on the prior CT.  Left Kidney:  11.8 cm. No hydronephrosis.  Normal renal cortical thickness and echogenicity.  Bladder:  Within normal limits  IMPRESSION: No acute process or explanation for renal failure   Original Report Authenticated By: Consuello Bossier, M.D.    Ct Foot Right Wo Contrast  06/09/2012  *RADIOLOGY REPORT*  Clinical Data:  Right foot and ankle pain and swelling.  Clinical concern for occult fracture.  CT RIGHT FOOT WITHOUT CONTRAST  Technique:  Multidetector CT imaging of the right foot was performed according to the standard protocol without intravenous contrast. Multiplanar CT image reconstructions were also generated.  Comparison:  Right foot radiographs dated 06/06/2012.  Findings:  Diffuse subcutaneous edema, most pronounced dorsally. No bone destruction or periosteal reaction.  Small cystic areas in the talus and lateral malleolus.  Inferior calcaneal spur.  IMPRESSION:  1.  No fracture or CT evidence of osteomyelitis. 2.  Diffuse subcutaneous edema, most pronounced dorsally. 3.  Small talar and lateral malleolus cysts. 4.  Inferior calcaneal spur.  CT RIGHT ANKLE WITHOUT CONTRAST  Technique:  Multidetector CT imaging of the right ankle was performed according to the standard protocol without intravenous contrast. Multiplanar CT image reconstructions were also generated.  Comparison:  Right ankle radiographs dated 12/09/2008.  Findings:  Diffuse  subcutaneous edema, most pronounced anteriorly, laterally and medially.  Inferior calcaneal spur.  Small cysts in the talus and lateral malleolus.  No bone destruction or periosteal reaction.  No fracture or dislocation seen.  IMPRESSION:  1.  No fracture or CT evidence of osteomyelitis. 2.  Diffuse subcutaneous  edema. 3.  Small talar and lateral malleolar cysts. 4.  Inferior calcaneal spur.   Original Report Authenticated By: Darrol Angel, M.D.    Dg Foot Complete Right  06/06/2012  *RADIOLOGY REPORT*  Clinical Data: Right foot pain  RIGHT FOOT COMPLETE - 3+ VIEW  Comparison: 12/09/2008 ankle radiographs  Findings: Lisfranc joint intact.  No displaced fracture or dislocation.  No aggressive osseous lesions.  Plantar arch and enthesopathic change.  Mild midfoot DJD.  IMPRESSION: No acute or aggressive osseous abnormality of the right foot.   Original Report Authenticated By: Waneta Martins, M.D.     Scheduled Meds:   . aspirin EC  81 mg Oral Daily  . carvedilol  3.125 mg Oral BID WC  . DAPTOmycin (CUBICIN)  IV  500 mg Intravenous Q48H  . elvitegravir-cobicistat-emtricitabine-tenofovir  1 tablet Oral Q breakfast  . enoxaparin (LOVENOX) injection  40 mg Subcutaneous Q24H  . gabapentin  300 mg Oral TID  . isosorbide mononitrate  60 mg Oral Daily  . pantoprazole  40 mg Oral Daily  . traZODone  50 mg Oral QHS  . DISCONTD: DAPTOmycin (CUBICIN)  IV  4 mg/kg (Adjusted) Intravenous Q24H  . DISCONTD: gabapentin  400 mg Oral TID  . DISCONTD: sodium chloride  3 mL Intravenous Q12H   Continuous Infusions:   . sodium chloride 100 mL/hr at 06/11/12 1705    Active Problems:  HUMAN IMMUNODEFICIENCY VIRUS [HIV]  HYPERTENSION  COPD  Chronic systolic CHF (congestive heart failure)  Hypokalemia  Cellulitis of right leg  Creatinine elevation    Time spent: 40 minutes   [facility]  [facility] Pager [phone]. If 8PM-8AM, please contact night-coverage at [email or web address], password  [facility] 06/12/2012, 10:30 AM  LOS: 3 days

## 2012-06-12 NOTE — Evaluation (Signed)
Occupational Therapy Evaluation Patient Details Name: [name] MRN: [id number] DOB: Jan 05, 1961 Today's Date: 06/12/2012 Time: 4098-1191 OT Time Calculation (min): 24 min  OT Assessment / Plan / Recommendation Clinical Impression  [name] 51 y.o. female admitted with worsening cellulities Rt. LE.  Pt. is limited by pain 10/10 with mobility - pain increases with Rt. LE in dependent position.  Pt. is very motivated.  She will benefit from OT to maximize safety and independence with BADLs.  Pt. with limited support at discharge.  Disposition will be dependent upon pain control and progress - home vs. SNF    OT Assessment  Patient needs continued OT Services    Follow Up Recommendations  Home health OT;Skilled nursing facility    Barriers to Discharge Decreased caregiver support    Equipment Recommendations  Tub/shower bench;3 in 1 bedside comode (extra wide)    Recommendations for Other Services    Frequency  Min 2X/week    Precautions / Restrictions Precautions Precautions: Fall Restrictions Weight Bearing Restrictions: No       ADL  Eating/Feeding: Simulated;Independent Where Assessed - Eating/Feeding: Bed level Grooming: Simulated;Wash/dry hands;Wash/dry face;Teeth care;Brushing hair;Set up Where Assessed - Grooming: Unsupported sitting Upper Body Bathing: Simulated;Set up Where Assessed - Upper Body Bathing: Unsupported sitting Lower Body Bathing: Simulated;Minimal assistance Where Assessed - Lower Body Bathing: Supported sit to stand Upper Body Dressing: Simulated;Set up Where Assessed - Upper Body Dressing: Unsupported sitting Lower Body Dressing: Simulated;Moderate assistance (due to pain) Where Assessed - Lower Body Dressing: Supported sit to stand Toilet Transfer: Performed;Minimal Dentist Method: Surveyor, minerals: Materials engineer and Hygiene: Simulated;Moderate assistance Where Assessed  - Toileting Clothing Manipulation and Hygiene: Standing Transfers/Ambulation Related to ADLs: Pt. limited by pain.  Min A to min guard for sit to stand and transfers ADL Comments: Pt. unable to tolerate LB ADLs due to Rt. LE pain    OT Diagnosis: Generalized weakness;Acute pain  OT Problem List: Decreased strength;Decreased activity tolerance;Decreased knowledge of use of DME or AE;Pain;Obesity OT Treatment Interventions: Self-care/ADL training;DME and/or AE instruction;Therapeutic activities;Patient/family education   OT Goals Acute Rehab OT Goals OT Goal Formulation: With patient Time For Goal Achievement: 06/26/12 Potential to Achieve Goals: Good ADL Goals Pt Will Perform Grooming: Supported;Standing at sink (min guard assist) ADL Goal: Grooming - Progress: Goal set today Pt Will Perform Lower Body Bathing: Sit to stand from bed;Sit to stand from chair (min guard assist) ADL Goal: Lower Body Bathing - Progress: Goal set today Pt Will Perform Lower Body Dressing: Sit to stand from chair;Sit to stand from bed (min guard assist) ADL Goal: Lower Body Dressing - Progress: Goal set today Pt Will Transfer to Toilet: Ambulation;Extra wide 3-in-1 (min guard assist) ADL Goal: Toilet Transfer - Progress: Goal set today Pt Will Perform Toileting - Clothing Manipulation: Standing;with supervision ADL Goal: Toileting - Clothing Manipulation - Progress: Goal set today Pt Will Perform Tub/Shower Transfer: Tub transfer;with min assist;Ambulation;Transfer tub bench ADL Goal: Tub/Shower Transfer - Progress: Goal set today  Visit Information  Last OT Received On: 06/12/12 Assistance Needed: +1 (+2 if ambulating)    Subjective Data  Subjective: "It hurts so bad if it hangs down" Patient Stated Goal: To reduce pain Rt. LE   Prior Functioning     Home Living Lives With: Significant other (fiance') Available Help at Discharge: Available PRN/intermittently (Fiance' works 3rd shift and sleeps  during day) Type of Home: House Home Access: Stairs to enter [employer] of  Steps: 3 Entrance Stairs-Rails: Left;Right;Can reach both Home Layout: One level Bathroom Shower/Tub: Tub/shower unit;Curtain Firefighter: Standard Bathroom Accessibility: Yes How Accessible: Accessible via walker Home Adaptive Equipment: Grab bars in shower Prior Function Level of Independence: Needs assistance Needs Assistance: Meal Prep;Light Housekeeping Meal Prep: Maximal Light Housekeeping: Maximal Able to Take Stairs?: Yes Driving: Yes Comments: Pt. reports independece has decreased with the pain Communication Communication: Expressive difficulties (from CVA - worst when fatigued, or not feeling well) Dominant Hand: Right         Vision/Perception     Cognition  Overall Cognitive Status: Appears within functional limits for tasks assessed/performed Arousal/Alertness: Awake/alert Orientation Level: Appears intact for tasks assessed Behavior During Session: [facility] for tasks performed    Extremity/Trunk Assessment Right Upper Extremity Assessment RUE ROM/Strength/Tone: Within functional levels RUE Coordination: WFL - gross/fine motor Left Upper Extremity Assessment LUE ROM/Strength/Tone: Deficits LUE ROM/Strength/Tone Deficits: 4/5 due to previous CVA LUE Coordination: WFL - gross/fine motor     Mobility Bed Mobility Bed Mobility: Supine to Sit;Sitting - Scoot to Edge of Bed;Sit to Supine Supine to Sit: 5: Supervision;HOB elevated;With rails Sitting - Scoot to Edge of Bed: 5: Supervision;With rail Sit to Supine: 4: Min assist;With rail;HOB elevated Details for Bed Mobility Assistance: Assist for Rt. LE Transfers Transfers: Sit to Stand;Stand to Sit Sit to Stand: 4: Min guard;From bed;With upper extremity assist Stand to Sit: 4: Min guard;To bed;With upper extremity assist Details for Transfer Assistance: Pt. is dependent upon Bil UE support.  Pt. unable to weightbear  through Rt. LE due to increased pain     Shoulder Instructions     Exercise     Balance     End of Session OT - End of Session Activity Tolerance: Patient limited by pain Patient left: in bed;with call bell/phone within reach  GO Functional Limitation: Self care Self Care Current Status (G9562): At least 20 percent but less than 40 percent impaired, limited or restricted Self Care Goal Status (Z3086): At least 1 percent but less than 20 percent impaired, limited or restricted   Khye Hochstetler M 06/12/2012, 3:03 PM

## 2012-06-12 NOTE — Progress Notes (Signed)
*  Preliminary Results* Right lower extremity venous duplex completed. Right lower extremity is negative for deep vein thrombosis. Unable to visualize the right posterior tibial and peroneal veins, therefore deep vein thrombosis cannot be excluded in these segments.  06/12/2012 4:24 PM Gertie Fey, RDMS, RDCS
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
## 2012-06-12 NOTE — Progress Notes (Signed)
ANTIBIOTIC CONSULT NOTE - FOLLOW UP  Pharmacy Consult for Cubicin Indication: right leg cellulitis  Allergies  Allergen Reactions  . Atorvastatin Other (See Comments)    Patient states that the medication affects her memory    Patient Measurements: Height: 5\' 2"  (157.5 cm) Weight: 278 lb (126.1 kg) IBW/kg (Calculated) : 50.1    Vital Signs: Temp: 98.4 F (36.9 C) (10/07 0202) BP: 110/59 mmHg (10/07 0202) Pulse Rate: 90  (10/07 0202) Intake/Output from previous day: 10/06 0701 - 10/07 0700 In: 2040 [P.O.:840; I.V.:1200] Out: 300 [Urine:300] Intake/Output from this shift:    Labs:  Basename 06/12/12 0650 06/10/12 0820 06/09/12 1907 06/09/12 1307  WBC 9.1 -- 8.6 9.2  HGB 8.9* -- 9.4* 10.5*  PLT 256 -- 154 153  LABCREA -- -- -- --  CREATININE 3.76* 1.66* 1.14* --   Estimated Creatinine Clearance: 22.5 ml/min (by C-G formula based on Cr of 3.76). No results found for this basename: VANCOTROUGH:2,VANCOPEAK:2,VANCORANDOM:2,GENTTROUGH:2,GENTPEAK:2,GENTRANDOM:2,TOBRATROUGH:2,TOBRAPEAK:2,TOBRARND:2,AMIKACINPEAK:2,AMIKACINTROU:2,AMIKACIN:2, in the last 72 hours   Microbiology: Recent Results (from the past 720 hour(s))  CULTURE, BLOOD (ROUTINE X 2)     Status: Normal (Preliminary result)   Collection Time   06/09/12  1:15 PM      Component Value Range Status Comment   Specimen Description BLOOD ARM RIGHT   Final    Special Requests BOTTLES DRAWN AEROBIC ONLY 10CC   Final    Culture  Setup Time 06/09/2012 21:11   Final    Culture     Final    Value:        BLOOD CULTURE RECEIVED NO GROWTH TO DATE CULTURE WILL BE HELD FOR 5 DAYS BEFORE ISSUING A FINAL NEGATIVE REPORT   Report Status PENDING   Incomplete   CULTURE, BLOOD (ROUTINE X 2)     Status: Normal (Preliminary result)   Collection Time   06/09/12  1:18 PM      Component Value Range Status Comment   Specimen Description BLOOD HAND RIGHT   Final    Special Requests BOTTLES DRAWN AEROBIC ONLY 10CC   Final    Culture   Setup Time 06/09/2012 21:11   Final    Culture     Final    Value:        BLOOD CULTURE RECEIVED NO GROWTH TO DATE CULTURE WILL BE HELD FOR 5 DAYS BEFORE ISSUING A FINAL NEGATIVE REPORT   Report Status PENDING   Incomplete     Anti-infectives     Start     Dose/Rate Route Frequency Ordered Stop   06/10/12 1600   DAPTOmycin (CUBICIN) 322 mg in sodium chloride 0.9 % IVPB        4 mg/kg  80.5 kg (Adjusted) 212.9 mL/hr over 30 Minutes Intravenous Every 24 hours 06/10/12 1458     06/10/12 1545   DAPTOmycin (CUBICIN) 504.5 mg in sodium chloride 0.9 % IVPB  Status:  Discontinued        4 mg/kg  126.1 kg 220.2 mL/hr over 30 Minutes Intravenous Every 24 hours 06/10/12 1432 06/10/12 1504   06/10/12 0800   elvitegravir-cobicistat-emtricitabine-tenofovir (STRIBILD) 150-150-200-300 MG tablet 1 tablet        1 tablet Oral Daily with breakfast 06/09/12 1813     06/10/12 0700   vancomycin (VANCOCIN) 1,250 mg in sodium chloride 0.9 % 250 mL IVPB  Status:  Discontinued        1,250 mg 166.7 mL/hr over 90 Minutes Intravenous Every 12 hours 06/09/12 1813 06/10/12 1429   06/09/12 1813  valACYclovir (VALTREX) tablet 500 mg        500 mg Oral 2 times daily PRN 06/09/12 1813     06/09/12 1730   vancomycin (VANCOCIN) 750 mg in sodium chloride 0.9 % 150 mL IVPB        750 mg 150 mL/hr over 60 Minutes Intravenous NOW 06/09/12 1728 06/09/12 2232   06/09/12 1445   vancomycin (VANCOCIN) IVPB 1000 mg/200 mL premix        1,000 mg 200 mL/hr over 60 Minutes Intravenous  Once 06/09/12 1433 06/09/12 1607          Assessment: Patient is a 51 y.o F on cubicin day #2 (total IV abx day #4) for right leg cellulitis.  Patient is afebrile, wbc wnl, and blood cultures are NGTD.  Scr continues to increase with 3.76 today (est crcl~20).  Plan:  1) change Cubicin to 500mg  (based on total body weight) and change frequency to q48h d/t renal function 2) f/u with renal function and cultures  [name]  P 06/12/2012,10:10 AM

## 2012-06-13 ENCOUNTER — Inpatient Hospital Stay (HOSPITAL_COMMUNITY): Payer: Medicare PPO

## 2012-06-13 ENCOUNTER — Observation Stay (HOSPITAL_COMMUNITY): Payer: Medicare PPO

## 2012-06-13 DIAGNOSIS — E669 Obesity, unspecified: Secondary | ICD-10-CM

## 2012-06-13 DIAGNOSIS — Q211 Atrial septal defect: Secondary | ICD-10-CM

## 2012-06-13 LAB — TROPONIN I: Troponin I: <0.3 ng/mL (ref <0.30)

## 2012-06-13 LAB — LIPID PANEL
HDL: 37 mg/dL — ABNORMAL LOW (ref >39)
LDL Cholesterol: 98 mg/dL (ref 0–99)
Total CHOL/HDL Ratio: 4.2 RATIO
Triglycerides: 106 mg/dL (ref <150)

## 2012-06-13 LAB — HEMOGLOBIN A1C: Mean Plasma Glucose: 131 mg/dL — ABNORMAL HIGH (ref <117)

## 2012-06-13 LAB — RENAL FUNCTION PANEL
Albumin: 2.6 g/dL — ABNORMAL LOW (ref 3.5–5.2)
BUN: 43 mg/dL — ABNORMAL HIGH (ref 6–23)
Calcium: 9.9 mg/dL (ref 8.4–10.5)
Creatinine, Ser: 2.57 mg/dL — ABNORMAL HIGH (ref 0.50–1.10)
GFR calc non Af Amer: 20 mL/min — ABNORMAL LOW (ref >90)
Phosphorus: 3.9 mg/dL (ref 2.3–4.6)

## 2012-06-13 LAB — URIC ACID: Uric Acid, Serum: 8.1 mg/dL — ABNORMAL HIGH (ref 2.4–7.0)

## 2012-06-13 MED ORDER — ASPIRIN 325 MG PO TABS
325.0000 mg | ORAL_TABLET | Freq: Every day | ORAL | Status: DC
  Administered 2012-06-13 – 2012-06-15 (×3): 325 mg via ORAL
  Filled 2012-06-13 (×3): qty 1

## 2012-06-13 NOTE — Progress Notes (Signed)
Stroke Team Progress Note  HISTORY  [name] is an 51 y.o. female who initially was admitted to [facility] for management of RLE cellulitis that failed oral antibiotic treatment. She has a complicated PMHx and current problem list. During her stay she has had creatinine elevation consistent with AKI. Several potentially nephrotoxic meds have been stopped. The patient is HIV positive, with a viral load of <50 and CD4 count of 483 as of her 9/13 labs. She also has history of COPD, HTN, morbid obesit and chronic systolic CHF.   She has a history of prior hospitalization in April of this year for a posterior insular and posterior frontal lobe ischemic stroke on the right. She received tPA at that visit. She has been followed by [facility] for this. The patient has mild residual left-sided weakness and some speech difficulty secondary to the stroke.  Early this AM, the covering physician for the admitting team was notified by Rapid Response RN that the patient was experiencing stroke-like symptoms.   Per patient, she awoke and her LLE was hanging off of the bed; she was unable to lift it back onto the bed. Her speech had also become more slurred. The patient stated that overall she felt just like she did before her prior stroke. Despite the weakness, the patient reports that she was still able to get up with assistance and walk to the bathroom. The patient also endorses that the above symptoms have been occurring intermittently since Friday night.   CT performed at 0215 this AM (10/8) revealed chronic infarcts in the right brain, with no acute intracranial abnormality, per the radiology report. On my review, there is possible small late subacute left frontal subdural hematoma versus artifact.   No DVT was noted on ultrasound of her RLE yesterday, however, the study was suboptimal    SUBJECTIVE She is sitting up in bed.  Overall she feels her condition is unchanged.   OBJECTIVE Most recent  Vital Signs: Filed Vitals:   06/12/12 1015 06/12/12 1417 06/12/12 2145 06/13/12 0550  BP:  103/54 141/80 127/75  Pulse: 92 101 98 99  Temp: 98 F (36.7 C) 98.5 F (36.9 C) 98.4 F (36.9 C) 98.6 F (37 C)  TempSrc: Oral Oral Oral Oral  Resp: 20 18 18 19   Height:      Weight:      SpO2: 100% 98% 100% 100%   CBG (last 3)  No results found for this basename: GLUCAP:3 in the last 72 hours Intake/Output from previous day: 10/07 0701 - 10/08 0700 In: 823 [P.O.:240; I.V.:583] Out: 2800 [Urine:2800]  IV Fluid Intake:     . sodium chloride 100 mL/hr at 06/11/12 1705    MEDICATIONS    . carvedilol  3.125 mg Oral BID WC  . DAPTOmycin (CUBICIN)  IV  500 mg Intravenous Q48H  . enoxaparin (LOVENOX) injection  40 mg Subcutaneous Q24H  . gabapentin  100 mg Oral TID  . isosorbide mononitrate  60 mg Oral Daily  . pantoprazole  40 mg Oral Daily  . traZODone  50 mg Oral QHS  . DISCONTD: aspirin EC  81 mg Oral Daily  . DISCONTD: DAPTOmycin (CUBICIN)  IV  4 mg/kg (Adjusted) Intravenous Q24H  . DISCONTD: elvitegravir-cobicistat-emtricitabine-tenofovir  1 tablet Oral Q breakfast  . DISCONTD: gabapentin  300 mg Oral TID  . DISCONTD: gabapentin  400 mg Oral TID   PRN:  acetaminophen, acetaminophen, albuterol, albuterol, bisacodyl, diphenoxylate-atropine, HYDROmorphone (DILAUDID) injection, nitroGLYCERIN, ondansetron (ZOFRAN) IV, ondansetron, oxyCODONE,  oxyCODONE-acetaminophen, DISCONTD: valACYclovir  Diet:  Renal thin liquids Activity:  Up with assistance DVT Prophylaxis:  lovenox  CLINICALLY SIGNIFICANT STUDIES Basic Metabolic Panel:  Lab 06/13/12 1191 06/12/12 0650  NA 141 135  K 4.9 4.6  CL 109 102  CO2 23 21  GLUCOSE 104* 96  BUN 43* 45*  CREATININE 2.57* 3.76*  CALCIUM 9.9 9.3  MG -- --  PHOS 3.9 --   Liver Function Tests:  Lab 06/13/12 0652 06/12/12 0650 06/09/12 1307  AST -- 23 34  ALT -- 19 28  ALKPHOS -- 92 101  BILITOT -- 0.2* 0.6  PROT -- 6.8 7.3  ALBUMIN 2.6*  2.5* --   CBC:  Lab 06/12/12 0650 06/09/12 1907 06/09/12 1307  WBC 9.1 8.6 --  NEUTROABS 6.5 -- 7.0  HGB 8.9* 9.4* --  HCT 28.2* 29.6* --  MCV 85.5 84.3 --  PLT 256 154 --   Coagulation: No results found for this basename: LABPROT:4,INR:4 in the last 168 hours Cardiac Enzymes:  Lab 06/12/12 1220 06/10/12 1500 06/09/12 1500  CKTOTAL 625* 95 77  CKMB -- -- --  CKMBINDEX -- -- --  TROPONINI -- -- --   Urinalysis:  Lab 06/11/12 1112  COLORURINE YELLOW  LABSPEC 1.015  PHURINE 5.0  GLUCOSEU NEGATIVE  HGBUR SMALL*  BILIRUBINUR SMALL*  KETONESUR NEGATIVE  PROTEINUR NEGATIVE  UROBILINOGEN 0.2  NITRITE NEGATIVE  LEUKOCYTESUR SMALL*   Lipid Panel    Component Value Date/Time   CHOL 197 04/11/2012 1217   TRIG 137 04/11/2012 1217   HDL 53 04/11/2012 1217   CHOLHDL 3.7 04/11/2012 1217   VLDL 27 04/11/2012 1217   LDLCALC 117* 04/11/2012 1217   HgbA1C  Lab Results  Component Value Date   HGBA1C 5.5 12/19/2011    Urine Drug Screen:     Component Value Date/Time   LABOPIA POSITIVE* 05/24/2011 1800   LABOPIA NEGATIVE 05/23/2009 0542   COCAINSCRNUR POSITIVE* 05/24/2011 1800   COCAINSCRNUR NEGATIVE 05/23/2009 0542   LABBENZ NONE DETECTED 05/24/2011 1800   LABBENZ NEGATIVE 05/23/2009 0542   AMPHETMU NONE DETECTED 05/24/2011 1800   AMPHETMU NEGATIVE 05/23/2009 0542   THCU POSITIVE* 05/24/2011 1800   LABBARB NONE DETECTED 05/24/2011 1800    Alcohol Level: No results found for this basename: ETH:2 in the last 168 hours  Ct Head Wo Contrast 06/13/2012   Chronic infarcts in the right brain. No acute intracranial abnormality.     US Renal 06/12/2012  *RADIOLOGY REPORT*  Clinical Data: Acute renal failure.  RENAL/URINARY TRACT ULTRASOUND COMPLETE  Comparison:  CT of 03/11/2012  Findings:  Right Kidney:  12.1 cm. No hydronephrosis.  Normal renal cortical thickness and echogenicity.  Right renal 1.6 cm lesion is technically indeterminate but most consistent with a cyst on the prior CT.  Left Kidney:   11.8 cm. No hydronephrosis.  Normal renal cortical thickness and echogenicity.  Bladder:  Within normal limits  IMPRESSION: No acute process or explanation for renal failure   Original Report Authenticated By: Consuello Bossier, M.D.     MRI of the brain  1. Small focal area of restricted diffusion involving the left mid frontal parasagittal axis representing a small ischemic infarct. 2. Shine through artifact in the right mid frontal subcortical white matter related to remote ischemia. 3. Remote ischemia involving the right cerebellar hemisphere. 4. Inflammatory thickening of the mucosa of the ethmoid air cells and frontal sinuses.  MRA of the brain   2D Echocardiogram    Carotid Doppler  CXR  Mild cardiomegally  EKG   Therapy Recommendations PT - ; OT - ; ST -   Physical Exam   Middle aged african american lady not in distress.Awake alert. Afebrile. Head is nontraumatic. Neck is supple without bruit. Hearing is normal. Cardiac exam no murmur or gallop. Lungs are clear to auscultation. Distal pulses are well felt. Neurological Exam :  Awake alert oriented x 3 normal speech and language.Fundi not visualized. Vision acuity and fields appear intact. Mild left lower face asymmetry. Tongue midline. No drift but poor effort on left on strength testing.. Mild diminished fine finger movements on left. Orbits right over left upper extremity. Mild left grip weak.. Normal sensation . Normal coordination.  ASSESSMENT [name] is a 51 y.o. female with inability to lift LLE back into bed, slurred speech. Imaging shows only chronic infacrtcs in the right brain. MRI shows: 1. Small focal area of restricted diffusion involving the left mid frontal parasagittal axis representing a small ischemic infarct. 2. Shine through artifact in the right mid frontal subcortical white matter related to remote ischemia. 3. Remote ischemia involving the right cerebellar hemisphere. Suspect transient worsening o old   left hemiparesis in setting of acute medical illness. On aspirin 81 mg orally every day prior to admission. Now on aspirin 325 mg orally every day for secondary stroke prevention. Patient with resultant left hemiparesis, speech impairment.   Small Left mid frontal parasagittal ischemic infarct-clinically silent  Polysusbstance abuse  Failed bedside swallow  Hypertension  Acute renal failure  Human immunodeficency virus  Right leg cellulitis, treated  Hospital day # 4  TREATMENT/PLAN  Continue aspirin 325 mg orally every day for secondary stroke prevention.  Polysubstance abuse couselling  Formal bedside swallow eval, keep NPO for now. I have asked that renal team be made aware  Risk factor modification  MRA/echo/carotid/ekg all pending  Guy Franco, PAC,  MBA, MHA [facility] Pager: [phone] 06/13/2012 9:11 AM  Scribe for Dr. Delia Heady, Stroke Center Medical Director. He has personally reviewed chart, pertinent data, examined the patient and developed the plan of care. Pager:  [phone]
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
## 2012-06-13 NOTE — Progress Notes (Signed)
Called by unit RN due to patient reports of difficulty speaking and left leg weakness. Patient states that the sx's started Saturday night while talking with her daughter on the telephone. States that tonight she was sleeping and her left leg fell of the bed and it was difficult to put the leg back on the bed. Unit RN reports that patient has been speaking the same way since Friday. No dysarthria or dysphagia. Patient also reports having a ringing/fuzzy sound to right ear. Spoke with On-call practitioner about patient reports. No RRT interventions needed at present. Recommended neurology consult.

## 2012-06-13 NOTE — Evaluation (Addendum)
Physical Therapy Evaluation Patient Details Name: [name] MRN: [id number] DOB: Apr 14, 1961 Today's Date: 06/13/2012 Time: 8295-6213 PT Time Calculation (min): 32 min  PT Assessment / Plan / Recommendation Clinical Impression  Pt adm with RLE cellulitis with severe pain and also found to have a new small Lt frontal CVA. Pt reports her LLE feels weaker than before, and testing of RLE is limited due to edema and pain. Will benefit from PT as pain controlled and able to participate in education on safe use of DME.    PT Assessment  Patient needs continued PT services    Follow Up Recommendations  Post acute inpatient;Supervision for mobility/OOB    Does the patient have the potential to tolerate intense rehabilitation   Yes, Recommend IP Rehab Screening  Barriers to Discharge Decreased caregiver support      Equipment Recommendations  Rolling walker with 5" wheels;Tub/shower bench;3 in 1 bedside comode    Recommendations for Other Services Rehab consult   Frequency Min 3X/week    Precautions / Restrictions Precautions Precautions: Fall Restrictions Weight Bearing Restrictions: No   Pertinent Vitals/Pain 8/10 RLE progressing to 10/10 despite pre-medication for pain      Mobility  Bed Mobility Bed Mobility: Supine to Sit;Sitting - Scoot to Edge of Bed;Sit to Supine Supine to Sit: 5: Supervision;HOB elevated;With rails Sitting - Scoot to Edge of Bed: 5: Supervision;With rail Sit to Supine: 4: Min assist;With rail;HOB elevated Details for Bed Mobility Assistance: Assist for RLE; vc for sequencing Transfers Transfers: Sit to Stand;Stand to Sit Sit to Stand: 4: Min assist;From bed;With upper extremity assist Stand to Sit: 4: Min guard;To bed;With upper extremity assist Details for Transfer Assistance: stood to RW with PT stabilizing RW as she transitioned her hands from the bed to the RW handles; pt selecting to be NWB on the RLE due to pain; refused to attempt  step/hop/pivot on LLE Ambulation/Gait Ambulation/Gait Assistance: Not tested (comment)    Shoulder Instructions     Exercises General Exercises - Lower Extremity Ankle Circles/Pumps: AROM;Both;5 reps;Supine Straight Leg Raises: AAROM;Right;5 reps;Supine Other Exercises Other Exercises: toe wiggling on Rt   PT Diagnosis: Difficulty walking;Acute pain  PT Problem List: Decreased strength;Decreased activity tolerance;Decreased range of motion;Decreased balance;Decreased mobility;Decreased knowledge of use of DME;Impaired sensation;Obesity;Pain PT Treatment Interventions: DME instruction;Gait training;Stair training;Functional mobility training;Therapeutic activities;Therapeutic exercise;Balance training;Patient/family education   PT Goals Acute Rehab PT Goals PT Goal Formulation: With patient Time For Goal Achievement: 06/20/12 Potential to Achieve Goals: Good Pt will go Sit to Supine/Side: with modified independence;with HOB 0 degrees PT Goal: Sit to Supine/Side - Progress: Goal set today Pt will go Sit to Stand: with supervision;with upper extremity assist PT Goal: Sit to Stand - Progress: Goal set today Pt will Transfer Bed to Chair/Chair to Bed: with supervision PT Transfer Goal: Bed to Chair/Chair to Bed - Progress: Goal set today Pt will Ambulate: 1 - 15 feet;with min assist;with least restrictive assistive device PT Goal: Ambulate - Progress: Goal set today Pt will Go Up / Down Stairs: 3-5 stairs;with min assist;with rail(s);with least restrictive assistive device PT Goal: Up/Down Stairs - Progress: Goal set today Pt will Perform Home Exercise Program: with supervision, verbal cues required/provided PT Goal: Perform Home Exercise Program - Progress: Goal set today  Visit Information  Last PT Received On: 06/13/12 Assistance Needed: +2    Subjective Data  Subjective: Reports her foot hurts so badly she cannot put it on the floor Patient Stated Goal: decr Rt foot pain  Prior Functioning  Home Living Lives With: Significant other (fiance') Available Help at Discharge: Available PRN/intermittently (Fiance' works 3rd shift and sleeps during day) Type of Home: House Home Access: Stairs to enter [employer] of Steps: 3 Entrance Stairs-Rails: Left;Right;Can reach both Home Layout: One level Bathroom Shower/Tub: Forensic scientist: Standard Bathroom Accessibility: Yes How Accessible: Accessible via walker Home Adaptive Equipment: Crutches;Straight cane;Bedside commode/3-in-1;Grab bars in shower Prior Function Level of Independence: Needs assistance Needs Assistance: Meal Prep;Light Housekeeping Meal Prep: Maximal Light Housekeeping: Maximal Able to Take Stairs?: Yes Driving: Yes Comments: would sometimes use cane if arthritis was bad Communication Communication: Expressive difficulties Dominant Hand: Right    Cognition  Overall Cognitive Status: Appears within functional limits for tasks assessed/performed Arousal/Alertness: Awake/alert Orientation Level: Oriented X4 / Intact Behavior During Session: [facility] for tasks performed    Extremity/Trunk Assessment Right Lower Extremity Assessment RLE ROM/Strength/Tone: Deficits;Unable to fully assess;Due to pain RLE ROM/Strength/Tone Deficits: hip flexion at least 3+/5, quads grossly 3-/5, unable to assess otherwise due to severe edema and pain in lower leg due to cellulitis Left Lower Extremity Assessment LLE ROM/Strength/Tone: [facility] for tasks assessed LLE Sensation: Deficits LLE Sensation Deficits: reports numbness throughout when compared to light touch on Lt side of face LLE Coordination: WFL - gross motor   Balance Balance Balance Assessed: Yes Static Sitting Balance Static Sitting - Balance Support: No upper extremity supported;Feet unsupported Static Sitting - Level of Assistance: 7: Independent Static Standing Balance Static Standing - Balance Support: Bilateral  upper extremity supported Static Standing - Level of Assistance: 4: Min assist  End of Session PT - End of Session Activity Tolerance: Patient limited by pain;Other (comment) (pt with tears running down her face with sit to stand) Patient left: in bed;with call bell/phone within reach Nurse Communication: Mobility status;Other (comment) (use of foot of bed elevation to assist with decr edema/pain)  GP     Floye Fesler 06/13/2012, 2:40 PM  Pager [phone]

## 2012-06-13 NOTE — Progress Notes (Signed)
PT Cancellation Note  Patient Details Name: LEVAEH GOYETTE MRN: [id number] DOB: [birth year]   Cancelled Treatment:    Reason Eval/Treat Not Completed: Other (comment) (eating breakfast and per RN, preparing to go to MRI) Will see later today, as pt is available.   Leilan Bochenek 06/13/2012, 10:00 AM Pager [phone]

## 2012-06-13 NOTE — Progress Notes (Signed)
[facility] PROGRESS NOTE  [name] OZH:[id number] DOB: 1960/12/24 DOA: 06/09/2012 PCP: Quitman Livings, MD  Assessment/Plan: Active Problems:  HUMAN IMMUNODEFICIENCY VIRUS [HIV]  HYPERTENSION  COPD  Chronic systolic CHF (congestive heart failure)  Hypokalemia  Cellulitis of right leg  Creatinine elevation Acute renal failure Possible CVA     1. possible CVA LLE weakness and worsening slurred speech (subjective): No obvious new focal deficits, however pt is difficult to assess d/t body habitus. RN reports speech unchanged by her assessment since admission. Ct head neg for acute process. Per neurology there may be small late subacute left frontal subdural hematoma versus artifact, there may be a possible extension of prior right hemispheric ischemic infarction.low suspicion for acute stroke. Not a tPA candidate, MRI recommended, will restart aspirin if MRI negative for subdural hematoma, MRI of the C-spine to rule out possible left hemicord lesion. Recent 2-D echo done on 12/20/11 shows an EF of 30-35%, will start aspirin this evening   2. acute kidney injury creatinine improving likely secondary to multiple medications as documented in nephrology notes Good urine output  3. Cellulitis, continue daptomycin, improving, Doppler negative for DVT   4. HIV stribild and Valtrex on hold after consultation with infectious disease  5. Hypertension blood pressure stable off antihypertensive medications  6. chronic systolic heart failure continue imdur and Coreg, holding lisinopril,  7. COPD  -Stable, continue bronchodilators        Code Status: full Family Communication: family updated about patient's clinical progress Disposition Plan:  As above    Consultants:  Nephrology Neurology  Procedures:  As above Antibiotics:  dapto 10/05  vanc prior to that    HPI/Subjective: Reported difficulty speaking and left leg weakness yesterday Rapid response was called,  no dysarthria or dysphagia noted Pt also relates she has a ringing or "high frequency" sound in both ears that is intermittent but denies actual difficulty hearing.   Objective: Filed Vitals:   06/12/12 1015 06/12/12 1417 06/12/12 2145 06/13/12 0550  BP:  103/54 141/80 127/75  Pulse: 92 101 98 99  Temp: 98 F (36.7 C) 98.5 F (36.9 C) 98.4 F (36.9 C) 98.6 F (37 C)  TempSrc: Oral Oral Oral Oral  Resp: 20 18 18 19   Height:      Weight:      SpO2: 100% 98% 100% 100%    Intake/Output Summary (Last 24 hours) at 06/13/12 0936 Last data filed at 06/13/12 0900  Gross per 24 hour  Intake    943 ml  Output   2800 ml  Net  -1857 ml    Exam:  HEENT: PERRLA EOMI  NECK:no jvd no nodes  LUNGS:clear  CARDIAC:RRR wo MRG  ABD:+BS NTND no HSM  GEX:BMWUX edema on Lt. Rt foot markedly swollen and tender. Mild erythema  NEURO:Motor: Intermittent give-way weakness on evaluation of deltoids bilaterally. Otherwise 5/5 upper extremities. Lower extremities compromised by pain, with maximum left hip flexion 4/5, left knee extension and ADF/APF 5/5.  RLE cellulitis compromised RLE motor exam - deferred assessment of ADF/APF. Right hip flexion 3/5. Right knee extension 4/5.  Sensory: Decreased temperature sensation to RUE and RLE. Contralateral decreased fine touch sensation to LUE and LLE. Intact proprioception to right toe and right index finger, prominently decreased proprioception to left toe and left index finger.  Deep Tendon Reflexes: 2+ right brachioradialis and biceps. 1+ left brachioradialis and biceps. 4+ left patella (crossed adductor response on right), 0 left achilles. Deferred right patellar and achilles due to pain (  cellulitis). Left toe downgoing, right toe mute.  Cerebellar: Slow FNF with wavering/tremor bilaterally.  Gait: Deferred.   2-3+ edema of foot to just above the (R) ankle    Data Reviewed: Basic Metabolic Panel:  Lab 06/13/12 4098 06/12/12 0650 06/10/12 0820  06/09/12 1907 06/09/12 1307 06/08/12 1249  NA 141 135 133* -- 138 134*  K 4.9 4.6 4.0 -- 3.4* 3.8  CL 109 102 99 -- 102 99  CO2 23 21 27  -- 26 26  GLUCOSE 104* 96 110* -- 100* 103*  BUN 43* 45* 20 -- 12 12  CREATININE 2.57* 3.76* 1.66* 1.14* 1.01 --  CALCIUM 9.9 9.3 9.1 -- 10.0 9.5  MG -- -- -- -- -- --  PHOS 3.9 -- -- -- -- --    Liver Function Tests:  Lab 06/13/12 1191 06/12/12 0650 06/09/12 1307  AST -- 23 34  ALT -- 19 28  ALKPHOS -- 92 101  BILITOT -- 0.2* 0.6  PROT -- 6.8 7.3  ALBUMIN 2.6* 2.5* 3.0*   No results found for this basename: LIPASE:5,AMYLASE:5 in the last 168 hours No results found for this basename: AMMONIA:5 in the last 168 hours  CBC:  Lab 06/12/12 0650 06/09/12 1907 06/09/12 1307 06/08/12 1249  WBC 9.1 8.6 9.2 7.5  NEUTROABS 6.5 -- 7.0 6.1  HGB 8.9* 9.4* 10.5* 10.6*  HCT 28.2* 29.6* 32.7* 33.1*  MCV 85.5 84.3 83.6 84.7  PLT 256 154 153 135*    Cardiac Enzymes:  Lab 06/12/12 1220 06/10/12 1500 06/09/12 1500  CKTOTAL 625* 95 77  CKMB -- -- --  CKMBINDEX -- -- --  TROPONINI -- -- --   BNP (last 3 results)  Basename 04/23/12 2207 03/20/12 2011 01/31/12 1237  PROBNP 211.9* 311.5* 178.7*     CBG: No results found for this basename: GLUCAP:5 in the last 168 hours  Recent Results (from the past 240 hour(s))  CULTURE, BLOOD (ROUTINE X 2)     Status: Normal (Preliminary result)   Collection Time   06/09/12  1:15 PM      Component Value Range Status Comment   Specimen Description BLOOD ARM RIGHT   Final    Special Requests BOTTLES DRAWN AEROBIC ONLY 10CC   Final    Culture  Setup Time 06/09/2012 21:11   Final    Culture     Final    Value:        BLOOD CULTURE RECEIVED NO GROWTH TO DATE CULTURE WILL BE HELD FOR 5 DAYS BEFORE ISSUING A FINAL NEGATIVE REPORT   Report Status PENDING   Incomplete   CULTURE, BLOOD (ROUTINE X 2)     Status: Normal (Preliminary result)   Collection Time   06/09/12  1:18 PM      Component Value Range Status  Comment   Specimen Description BLOOD HAND RIGHT   Final    Special Requests BOTTLES DRAWN AEROBIC ONLY 10CC   Final    Culture  Setup Time 06/09/2012 21:11   Final    Culture     Final    Value:        BLOOD CULTURE RECEIVED NO GROWTH TO DATE CULTURE WILL BE HELD FOR 5 DAYS BEFORE ISSUING A FINAL NEGATIVE REPORT   Report Status PENDING   Incomplete      Studies: Dg Chest 2 View  06/09/2012  *RADIOLOGY REPORT*  Clinical Data: Leg pain.  Swelling in right foot.  CHEST - 2 VIEW  Comparison: Chest radiograph 06/07/2012 and 04/04/2013and  CT chest 12/22/2010  Findings: Mild cardiomegaly is stable. Left coronary artery stent is visible.  Pulmonary vascularity is within normal limits.  The lungs are clear.  There is no pleural effusion.  The trachea is midline.  Schmorl's node in the inferior endplate of the T7 vertebral body is stable compared to prior chest CT; no acute bony abnormality is identified.  IMPRESSION:  1.  Stable mild cardiomegaly.  No acute findings. 2.  Coronary artery stent.   Original Report Authenticated By: Britta Mccreedy, M.D.    Dg Chest 2 View  06/07/2012  *RADIOLOGY REPORT*  Clinical Data: Cough and congestion.  Shortness of breath.  CHEST - 2 VIEW  Comparison: 04/23/2012  Findings: Lung volumes are low.  No edema or focal airspace consolidation. Cardiopericardial silhouette is at upper limits of normal for size. Imaged bony structures of the thorax are intact.  IMPRESSION: No acute cardiopulmonary findings.   Original Report Authenticated By: ERIC A. MANSELL, M.D.    Ct Head Wo Contrast  06/13/2012  *RADIOLOGY REPORT*  Clinical Data: 51 year old female with slurred speech.  CT HEAD WITHOUT CONTRAST  Technique:  Contiguous axial images were obtained from the base of the skull through the vertex without contrast.  Comparison: Brain MRI 12/19/2011 and earlier.  Findings: Visualized paranasal sinuses and mastoids are clear. Postoperative changes to the globes.  No acute scalp soft tissue  abnormality. No acute osseous abnormality identified.  Patchy cortical encephalomalacia at the superior right sylvian fissure and central right frontal lobe white matter corresponding to the areas of diffusion abnormality in April.  Chronic right cerebellar infarct re-identified. No midline shift, mass effect, or evidence of mass lesion.  No ventriculomegaly. No acute intracranial hemorrhage identified.  No evidence of cortically based acute infarction identified.  Stable gray-white matter differentiation throughout the brain.  No suspicious intracranial vascular hyperdensity.  IMPRESSION: Chronic infarcts in the right brain. No acute intracranial abnormality.   Original Report Authenticated By: Harley Hallmark, M.D.    Ct Ankle Right Wo Contrast  06/09/2012  *RADIOLOGY REPORT*  Clinical Data:  Right foot and ankle pain and swelling.  Clinical concern for occult fracture.  CT RIGHT FOOT WITHOUT CONTRAST  Technique:  Multidetector CT imaging of the right foot was performed according to the standard protocol without intravenous contrast. Multiplanar CT image reconstructions were also generated.  Comparison:  Right foot radiographs dated 06/06/2012.  Findings:  Diffuse subcutaneous edema, most pronounced dorsally. No bone destruction or periosteal reaction.  Small cystic areas in the talus and lateral malleolus.  Inferior calcaneal spur.  IMPRESSION:  1.  No fracture or CT evidence of osteomyelitis. 2.  Diffuse subcutaneous edema, most pronounced dorsally. 3.  Small talar and lateral malleolus cysts. 4.  Inferior calcaneal spur.  CT RIGHT ANKLE WITHOUT CONTRAST  Technique:  Multidetector CT imaging of the right ankle was performed according to the standard protocol without intravenous contrast. Multiplanar CT image reconstructions were also generated.  Comparison:  Right ankle radiographs dated 12/09/2008.  Findings:  Diffuse subcutaneous edema, most pronounced anteriorly, laterally and medially.  Inferior calcaneal  spur.  Small cysts in the talus and lateral malleolus.  No bone destruction or periosteal reaction.  No fracture or dislocation seen.  IMPRESSION:  1.  No fracture or CT evidence of osteomyelitis. 2.  Diffuse subcutaneous edema. 3.  Small talar and lateral malleolar cysts. 4.  Inferior calcaneal spur.   Original Report Authenticated By: Darrol Angel, M.D.    US Renal  06/12/2012  *  RADIOLOGY REPORT*  Clinical Data: Acute renal failure.  RENAL/URINARY TRACT ULTRASOUND COMPLETE  Comparison:  CT of 03/11/2012  Findings:  Right Kidney:  12.1 cm. No hydronephrosis.  Normal renal cortical thickness and echogenicity.  Right renal 1.6 cm lesion is technically indeterminate but most consistent with a cyst on the prior CT.  Left Kidney:  11.8 cm. No hydronephrosis.  Normal renal cortical thickness and echogenicity.  Bladder:  Within normal limits  IMPRESSION: No acute process or explanation for renal failure   Original Report Authenticated By: Consuello Bossier, M.D.    Ct Foot Right Wo Contrast  06/09/2012  *RADIOLOGY REPORT*  Clinical Data:  Right foot and ankle pain and swelling.  Clinical concern for occult fracture.  CT RIGHT FOOT WITHOUT CONTRAST  Technique:  Multidetector CT imaging of the right foot was performed according to the standard protocol without intravenous contrast. Multiplanar CT image reconstructions were also generated.  Comparison:  Right foot radiographs dated 06/06/2012.  Findings:  Diffuse subcutaneous edema, most pronounced dorsally. No bone destruction or periosteal reaction.  Small cystic areas in the talus and lateral malleolus.  Inferior calcaneal spur.  IMPRESSION:  1.  No fracture or CT evidence of osteomyelitis. 2.  Diffuse subcutaneous edema, most pronounced dorsally. 3.  Small talar and lateral malleolus cysts. 4.  Inferior calcaneal spur.  CT RIGHT ANKLE WITHOUT CONTRAST  Technique:  Multidetector CT imaging of the right ankle was performed according to the standard protocol without  intravenous contrast. Multiplanar CT image reconstructions were also generated.  Comparison:  Right ankle radiographs dated 12/09/2008.  Findings:  Diffuse subcutaneous edema, most pronounced anteriorly, laterally and medially.  Inferior calcaneal spur.  Small cysts in the talus and lateral malleolus.  No bone destruction or periosteal reaction.  No fracture or dislocation seen.  IMPRESSION:  1.  No fracture or CT evidence of osteomyelitis. 2.  Diffuse subcutaneous edema. 3.  Small talar and lateral malleolar cysts. 4.  Inferior calcaneal spur.   Original Report Authenticated By: Darrol Angel, M.D.    Dg Foot Complete Right  06/06/2012  *RADIOLOGY REPORT*  Clinical Data: Right foot pain  RIGHT FOOT COMPLETE - 3+ VIEW  Comparison: 12/09/2008 ankle radiographs  Findings: Lisfranc joint intact.  No displaced fracture or dislocation.  No aggressive osseous lesions.  Plantar arch and enthesopathic change.  Mild midfoot DJD.  IMPRESSION: No acute or aggressive osseous abnormality of the right foot.   Original Report Authenticated By: Waneta Martins, M.D.     Scheduled Meds:   . carvedilol  3.125 mg Oral BID WC  . DAPTOmycin (CUBICIN)  IV  500 mg Intravenous Q48H  . enoxaparin (LOVENOX) injection  40 mg Subcutaneous Q24H  . gabapentin  100 mg Oral TID  . isosorbide mononitrate  60 mg Oral Daily  . pantoprazole  40 mg Oral Daily  . traZODone  50 mg Oral QHS  . DISCONTD: aspirin EC  81 mg Oral Daily  . DISCONTD: DAPTOmycin (CUBICIN)  IV  4 mg/kg (Adjusted) Intravenous Q24H  . DISCONTD: elvitegravir-cobicistat-emtricitabine-tenofovir  1 tablet Oral Q breakfast  . DISCONTD: gabapentin  300 mg Oral TID  . DISCONTD: gabapentin  400 mg Oral TID   Continuous Infusions:   . sodium chloride 100 mL/hr at 06/11/12 1705    Active Problems:  HUMAN IMMUNODEFICIENCY VIRUS [HIV]  HYPERTENSION  COPD  Chronic systolic CHF (congestive heart failure)  Hypokalemia  Cellulitis of right leg  Creatinine  elevation    Time spent: 40  minutes   [facility]  [facility] Pager [phone]. If 8PM-8AM, please contact night-coverage at [email or web address], password [facility] 06/13/2012, 9:36 AM  LOS: 4 days

## 2012-06-13 NOTE — Progress Notes (Signed)
Rehab Admissions Coordinator Note:  Patient was screened by Brock Ra for appropriateness for an Inpatient Acute Rehab Consult.  At this time, we are recommending Skilled Nursing Facility.  Pt has no assist at home & her insurance will not authorize CIR.  Brock Ra 06/13/2012, 5:14 PM  I can be reached at [phone].

## 2012-06-13 NOTE — Progress Notes (Signed)
Event: Notified by Rapid Response RN that pt c/o stroke like symptoms. Pt is s/p recent stroke and has been followed by [facility]. Reports that she has mild residual (L) sided weakness and some difficulty w/ speech that had improved. Tonight pt reported to RN that she awoke and her LLE was hanging off of the bed and she was unable to lift it back onto the bed. She also feels her speech has become more slurred and difficult.  Reported that she was feeling just like she did before her recent stroke. NP to bedside. Subjective: Pt reports hx of recent stroke. Admits to mild (L) sided weakness since recent stroke and some difficulty w/ speech that has greatly improved with therapy. States she has been followed by [facility] neurology. States that tonight she awoke w/ her (L) leg hanging off of the bed. She was unable to lift it back up which usually she is able to do. Pt does report that she was able to get up w/ assistance and walk to BR. She also believes her speech has become more slurred tonight. Pt now reveals that she has had these symptoms on and off since Friday night. States her daughter noted increased slurred speech Saturday night while speaking with her over the phone. But pt still feels symptoms have worsened tonight. Pt also relates she has a ringing or "high frequency" sound in both ears that is intermittent but denies actual difficulty hearing. Denies other symptoms. Objective: Records indicate this 51 y/o morbidly obese female admitted 06/09/12 for cellulitis of RLE. There is h/o hospitalization in 12/2011 s/p presentation for stroke symptoms. MRI at that time revealed "Acute non hemorrhagic infarct involving the posterior right insular cortex and posterior right frontal lobe". Pt received tPA and taken to IR but no intervention could be performed. Pt is HIV positive w/ VL <50 and CD4 count of 483 9/13.     At bedside pt noted lying quietly in bed in NAD. Awakens easily and relates tonight's  symptoms. Speech is noted to be labored but is coherent and w/ mild slurring of some words. (RN reports that pt's speech is unchanged, per her assessment, from admission. PERRLA, EOMI,  No obvious facial droop. CN I-XII appear grossly intact. BUE grips equal and strong, LLE strength 4+/5 but unable to compare w/ RLE due to current cellulitis of RLE. RLE noted w/ 2-3+ edema of foot to just above the (R) ankle. Marked erythema w/o open wounds or drainage. BBS diminished but otherwise CTA.  Recent VSS T-98.4, BP 141/80, P-98, R-18 w/ 02 sats of 98% on 2L [place]. Ct head w/o contrast obtained and reveals no acute process.  Assessment/Plan: 1. LLE weakness and worsening slurred speech (subjective): No obvious new focal deficits, however pt is difficult to assess d/t body habitus. RN reports speech unchanged by her assessment since admission. Ct head neg for acute process. I discussed pt  w/ Dr Otelia Limes  w/ neurology service and he has agreed to evaluate pt. Will continue to monitor closely. Appreciate neurology input.

## 2012-06-13 NOTE — Progress Notes (Signed)
S: Still with pain in Rt foot. Appetite marginal O:BP 127/75  Pulse 99  Temp 98.6 F (37 C) (Oral)  Resp 19  Ht 5\' 2"  (1.575 m)  Wt 126.1 kg (278 lb)  BMI 50.85 kg/m2  SpO2 100%  LMP 05/14/2011  Intake/Output Summary (Last 24 hours) at 06/13/12 0902 Last data filed at 06/13/12 0644  Gross per 24 hour  Intake    823 ml  Output   2800 ml  Net  -1977 ml   Weight change:  ZOX:WRUEA and alert CVS:RRR Resp:clear Abd:+ BS NT ND Ext:Rt ankle swollen, less erythematous NEURO:CNI Ox3      . carvedilol  3.125 mg Oral BID WC  . DAPTOmycin (CUBICIN)  IV  500 mg Intravenous Q48H  . enoxaparin (LOVENOX) injection  40 mg Subcutaneous Q24H  . gabapentin  100 mg Oral TID  . isosorbide mononitrate  60 mg Oral Daily  . pantoprazole  40 mg Oral Daily  . traZODone  50 mg Oral QHS  . DISCONTD: aspirin EC  81 mg Oral Daily  . DISCONTD: DAPTOmycin (CUBICIN)  IV  4 mg/kg (Adjusted) Intravenous Q24H  . DISCONTD: elvitegravir-cobicistat-emtricitabine-tenofovir  1 tablet Oral Q breakfast  . DISCONTD: gabapentin  300 mg Oral TID  . DISCONTD: gabapentin  400 mg Oral TID   Ct Head Wo Contrast  06/13/2012  *RADIOLOGY REPORT*  Clinical Data: 51 year old female with slurred speech.  CT HEAD WITHOUT CONTRAST  Technique:  Contiguous axial images were obtained from the base of the skull through the vertex without contrast.  Comparison: Brain MRI 12/19/2011 and earlier.  Findings: Visualized paranasal sinuses and mastoids are clear. Postoperative changes to the globes.  No acute scalp soft tissue abnormality. No acute osseous abnormality identified.  Patchy cortical encephalomalacia at the superior right sylvian fissure and central right frontal lobe white matter corresponding to the areas of diffusion abnormality in April.  Chronic right cerebellar infarct re-identified. No midline shift, mass effect, or evidence of mass lesion.  No ventriculomegaly. No acute intracranial hemorrhage identified.  No evidence of  cortically based acute infarction identified.  Stable gray-white matter differentiation throughout the brain.  No suspicious intracranial vascular hyperdensity.  IMPRESSION: Chronic infarcts in the right brain. No acute intracranial abnormality.   Original Report Authenticated By: Harley Hallmark, M.D.    US Renal  06/12/2012  *RADIOLOGY REPORT*  Clinical Data: Acute renal failure.  RENAL/URINARY TRACT ULTRASOUND COMPLETE  Comparison:  CT of 03/11/2012  Findings:  Right Kidney:  12.1 cm. No hydronephrosis.  Normal renal cortical thickness and echogenicity.  Right renal 1.6 cm lesion is technically indeterminate but most consistent with a cyst on the prior CT.  Left Kidney:  11.8 cm. No hydronephrosis.  Normal renal cortical thickness and echogenicity.  Bladder:  Within normal limits  IMPRESSION: No acute process or explanation for renal failure   Original Report Authenticated By: Consuello Bossier, M.D.    BMET    Component Value Date/Time   NA 141 06/13/2012 0652   K 4.9 06/13/2012 0652   CL 109 06/13/2012 0652   CO2 23 06/13/2012 0652   GLUCOSE 104* 06/13/2012 0652   BUN 43* 06/13/2012 0652   CREATININE 2.57* 06/13/2012 0652   CREATININE 0.76 04/11/2012 1217   CALCIUM 9.9 06/13/2012 0652   GFRNONAA 20* 06/13/2012 0652   GFRAA 24* 06/13/2012 0652   CBC    Component Value Date/Time   WBC 9.1 06/12/2012 0650   RBC 3.30* 06/12/2012 0650   HGB 8.9*  06/12/2012 0650   HCT 28.2* 06/12/2012 0650   PLT 256 06/12/2012 0650   MCV 85.5 06/12/2012 0650   MCH 27.0 06/12/2012 0650   MCHC 31.6 06/12/2012 0650   RDW 16.7* 06/12/2012 0650   LYMPHSABS 1.5 06/12/2012 0650   MONOABS 1.1* 06/12/2012 0650   EOSABS 0.1 06/12/2012 0650   BASOSABS 0.0 06/12/2012 0650     Assessment: 1. ARF most likely medication induced +/- infection,  UO excellent, renal fx improving 2. Rt ankle cellulitis 3. HIV 4. HTN  Plan: 1. Cont to hold Stribild and valtrex 2. Daily renal profile   Jayanth Szczesniak T

## 2012-06-13 NOTE — Progress Notes (Signed)
Patient reports increased left leg weakness and states, "I feel like I might have had a stroke between Friday and now. Something just doesn't feel right." Patient stated she had a previous stroke in April of this year. Face is symmetrical, arm strength is equal bilaterally, patient is able to stand if holding onto something for support, speech is slurred at times and that is baseline for her. Rapid response notified to come check on her. Will continue to monitor closely.

## 2012-06-13 NOTE — Progress Notes (Addendum)
*  PRELIMINARY RESULTS* Vascular Ultrasound Carotid Duplex (Doppler) has been completed.  Preliminary findings: bilateral proximal internal carotid arteries demonstrate increased velocities, but due to body habitus, cannot clearly evaluate presence of plaque in vessels. No obvious plaque noted.  Farrel Demark, RDMS, RVT  06/13/2012, 11:53 AM

## 2012-06-13 NOTE — Progress Notes (Signed)
Patient moved to room [phone] for telemetry monitoring.  All belongings sent with patient.  Telemetry monitor [phone] placed on patient. Telemetry monitor tech notified.  Patient noted to be in Sinus bradycardia- heart rate of 58.  Will continue to monitor.

## 2012-06-14 DIAGNOSIS — I6350 Cerebral infarction due to unspecified occlusion or stenosis of unspecified cerebral artery: Secondary | ICD-10-CM

## 2012-06-14 LAB — BASIC METABOLIC PANEL
Calcium: 9.6 mg/dL (ref 8.4–10.5)
Creatinine, Ser: 1.56 mg/dL — ABNORMAL HIGH (ref 0.50–1.10)
GFR calc non Af Amer: 37 mL/min — ABNORMAL LOW (ref >90)
Glucose, Bld: 119 mg/dL — ABNORMAL HIGH (ref 70–99)
Sodium: 141 mEq/L (ref 135–145)

## 2012-06-14 MED ORDER — HYDROMORPHONE HCL PF 1 MG/ML IJ SOLN
2.0000 mg | Freq: Once | INTRAMUSCULAR | Status: AC
  Administered 2012-06-14: 2 mg via INTRAVENOUS

## 2012-06-14 MED ORDER — CLINDAMYCIN PHOSPHATE 900 MG/50ML IV SOLN
900.0000 mg | Freq: Three times a day (TID) | INTRAVENOUS | Status: DC
  Administered 2012-06-14 – 2012-06-15 (×4): 900 mg via INTRAVENOUS
  Filled 2012-06-14 (×6): qty 50

## 2012-06-14 NOTE — Clinical Social Work Psychosocial (Signed)
     Clinical Social Work Department BRIEF PSYCHOSOCIAL ASSESSMENT 06/14/2012  Patient:  [name], [name]     Account Number:  [id number]     Admit date:  06/09/2012  Clinical Social Worker:  Hulan Fray  Date/Time:  06/14/2012 09:59 AM  Referred by:  CSW  Date Referred:  06/14/2012 Referred for  SNF Placement   Other Referral:   Interview type:  Patient Other interview type:    PSYCHOSOCIAL DATA Living Status:  SIGNIFICANT OTHER Admitted from facility:   Level of care:   Primary support name:  Donato Heinz Primary support relationship to patient:  PARTNER Degree of support available:   supportive    CURRENT CONCERNS Current Concerns  Post-Acute Placement   Other Concerns:    SOCIAL WORK ASSESSMENT / PLAN Clinical Social Worker reviewed chart and noticed that patient's insurance was not approving CIR. CSW introduced self and explained reason for visit. Patient reported that she has previously been to [facility] in April 2013 and was there for about a month. After her short term stay was completed, patient reported that she had home health PT and then outpatient PT. Patient stated that she is agreeable to CSW intiating a SNF search in [place]. CSW will complete FL2 for MD's signature and update patient when bed offers are made.   Assessment/plan status:   Other assessment/ plan:   Patient reported that she receives support from her three children and son-in-law as well as her fiance, who she lives with.   Information/referral to community resources:   SNF packet    PATIENTS/FAMILYS RESPONSE TO PLAN OF CARE: Patient reported previously being in a SNF in April 2013 and is agreeable to CSW intitating SNF search. Patient was appreciative of CSW's visit and concern.

## 2012-06-14 NOTE — Progress Notes (Signed)
Physical Therapy Treatment Patient Details Name: [name] MRN: [id number] DOB: 02-18-1961 Today's Date: 06/14/2012 Time: 4098-1191 PT Time Calculation (min): 25 min  PT Assessment / Plan / Recommendation Comments on Treatment Session  Pt with slightly less edema this date and was able to recall the 2 things she was to do to decr edema (exercises and elevation). Pain better controlled (was pre-medicated ~ 1 hour prior), however pt unable to ambulate as unable to unweight LLE to advance it. Pt agreeable to SNF for continued therapies.    Follow Up Recommendations  Post acute inpatient;Supervision/Assistance - 24 hour     Does the patient have the potential to tolerate intense rehabilitation  No, Recommend SNF  Barriers to Discharge        Equipment Recommendations  Rolling walker with 5" wheels;Tub/shower bench    Recommendations for Other Services    Frequency Min 3X/week   Plan Discharge plan needs to be updated;Frequency remains appropriate    Precautions / Restrictions Precautions Precautions: Fall Restrictions Weight Bearing Restrictions: No   Pertinent Vitals/Pain 0/10 pre-activity; 4/10 after    Mobility  Bed Mobility Bed Mobility: Supine to Sit;Sitting - Scoot to Edge of Bed Supine to Sit: 5: Supervision;HOB elevated;With rails Sitting - Scoot to Edge of Bed: 5: Supervision;With rail Details for Bed Mobility Assistance: pt able to move RLE off EOB without assistance (with effort and incr time) Transfers Transfers: Sit to Stand;Stand to Sit;Squat Pivot Transfers Sit to Stand: 4: Min assist;With upper extremity assist;From bed Stand to Sit: 4: Min guard;With upper extremity assist;To bed Squat Pivot Transfers: 1: +2 Total assist;With upper extremity assistance Squat Pivot Transfers: Patient Percentage: 90% Details for Transfer Assistance: stood x 2 to RW from bed; unable to advance LLE via "hop" or "heel/toe/shimmy" method (even in barefoot could not); audible  popping of Lt knee arthritis; squat pivot with reaching across to far armrest with +2 for safety (done x 2--to BSC then to recliner) Ambulation/Gait Ambulation/Gait Assistance: Not tested (comment) (unable due to LLE arthritis and decr UE strength) Modified Rankin (Stroke Patients Only) Pre-Morbid Rankin Score: Moderate disability Modified Rankin: Severe disability    Exercises General Exercises - Lower Extremity Ankle Circles/Pumps: AROM;Right;10 reps;Supine Other Exercises Other Exercises: toe wiggling on Rt Other Exercises: reviewed proper elevation to decr edema   PT Diagnosis:    PT Problem List:   PT Treatment Interventions:     PT Goals Acute Rehab PT Goals Pt will go Sit to Supine/Side: with modified independence;with HOB 0 degrees Pt will go Sit to Stand: with supervision;with upper extremity assist PT Goal: Sit to Stand - Progress: Progressing toward goal Pt will Transfer Bed to Chair/Chair to Bed: with supervision PT Transfer Goal: Bed to Chair/Chair to Bed - Progress: Progressing toward goal Pt will Ambulate: 1 - 15 feet;with min assist;with least restrictive assistive device PT Goal: Ambulate - Progress: Not progressing Pt will Perform Home Exercise Program: with supervision, verbal cues required/provided PT Goal: Perform Home Exercise Program - Progress: Progressing toward goal  Visit Information  Last PT Received On: 06/14/12 Assistance Needed: +2    Subjective Data  Subjective: Asking when she will see the SLP re: swallowing   Cognition  Overall Cognitive Status: Appears within functional limits for tasks assessed/performed Arousal/Alertness: Awake/alert Behavior During Session: [facility] for tasks performed    Balance     End of Session PT - End of Session Equipment Utilized During Treatment: Gait belt Activity Tolerance: Patient limited by pain Patient left: in  chair;with call bell/phone within reach Nurse Communication: Mobility status;Other (comment) (pt  set up to do her bath)   GP     Townsend Cudworth 06/14/2012, 10:44 AM Pager [phone]
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
## 2012-06-14 NOTE — Clinical Social Work Note (Addendum)
Clinical Social Worker provided patient the list of bed offers received.Patient's insurance requires prior authorization before able to transfer to facility.   Patient chooses [facility] for SNF at discharge. CSW alerted facility of choice.   Rozetta Nunnery MSW, [employer] [phone]

## 2012-06-14 NOTE — Consult Note (Signed)
[facility]     Reason for Consult:cellulitis, HIV    Referring Physician: Dr. Susie Cassette  Active Problems:  HUMAN IMMUNODEFICIENCY VIRUS [HIV]  HYPERTENSION  COPD  Chronic systolic CHF (congestive heart failure)  Hypokalemia  Cellulitis of right leg  Creatinine elevation      . aspirin  325 mg Oral Daily  . carvedilol  3.125 mg Oral BID WC  . clindamycin (CLEOCIN) IV  900 mg Intravenous Q8H  . enoxaparin (LOVENOX) injection  40 mg Subcutaneous Q24H  . gabapentin  100 mg Oral TID  .  HYDROmorphone (DILAUDID) injection  2 mg Intravenous Once  . isosorbide mononitrate  60 mg Oral Daily  . pantoprazole  40 mg Oral Daily  . traZODone  50 mg Oral QHS  . DISCONTD: DAPTOmycin (CUBICIN)  IV  500 mg Intravenous Q48H    Recommendations: Clindamycin IV  Continue to hold HIV meds and valtrex until seen in clinic.  I will check an HLA [id number] and otherwise need to wait for creat to resolve.   Assessment: Possible cellulitis of right lower leg.  Organisms are staph and strep, well covered by clindamycin.  I do not feel linezolid is required since it is community acquired and no signs or history of MRSA.   Antibiotics: Vancomycin daptomycin Clindamycin 10/9 -   HPI: [name] is a 51 y.o. female with well controlled HIV on Stribild for one month, previously on Prezista, norvir and Truvada, who presented with cellulitis of foot.  She was started on Bactrim after two ED visits but swelling and pain worsened.  She also had subjective fever.  She was started on vancomycin and admitted on 10/4.  She has had no positive cultures.  She began to develop renal insufficiency and felt likely due to meds and illness.  She also was taking lisinopril PTA.  She was then changed to daptomycin for her cellulitis but now has an elevated total CK.  Additionally, she developed left sided weakness, slurred speech but no right sided infarct.  MRI does show new, silent left sided  infarct.  She also is noted to have swallowing difficulty.     Review of Systems: Pertinent items are noted in HPI.  Past Medical History  Diagnosis Date  . Cholecystitis     Gall bladder removed   . Hypercholesterolemia   . Fibroids   . HIV (human immunodeficiency virus infection) 05/27/11  . Pelvic pain in female 10/14/11  . PMB (postmenopausal bleeding)     Since 2010  . Increased BMI 05/27/11  . Anxiety   . Depression   . Hypertension   . H/O varicella   . History of measles, mumps, or rubella   . Blood transfusion   . Hypothyroidism   . CHF (congestive heart failure)   . Myocardial infarction 07/2004  . COPD (chronic obstructive pulmonary disease)   . Pneumonia 1980's    "once"  . Iron deficiency anemia   . Lower GI bleed 04/24/2012    recurrent; "last episode 03/2012)  . Stroke 12/2011    residual "speech messes up when I get sick or real tired" (04/24/2012)  . Arthritis     "knees"    History  Substance Use Topics  . Smoking status: Former Smoker -- 0.5 packs/day for 29 years    Types: Cigarettes    Quit date: 05/07/2006  . Smokeless tobacco: Never Used  . Alcohol Use: 0.0 oz/week    2-3 Glasses of wine  per week     04/24/12 "wine once or twice q couple months"    Family History  Problem Relation Age of Onset  . Hypertension Mother   . Hyperlipidemia Mother   . Obesity Mother   . Heart disease Mother   . Hypertension Maternal Aunt   . Hyperlipidemia Maternal Aunt   . Obesity Maternal Aunt   . Stroke Maternal Aunt    Allergies  Allergen Reactions  . Atorvastatin Other (See Comments)    Patient states that the medication affects her memory    OBJECTIVE: Blood pressure 126/63, pulse 105, temperature 98.9 F (37.2 C), temperature source Oral, resp. rate 17, height 5\' 2"  (1.575 m), weight 278 lb (126.1 kg), last menstrual period 05/14/2011, SpO2 100.00%. General: Awake, in chair, nad Skin: no rashes Lungs: cta b Cor: RRR Ext - right leg with notable  edema to ankle, warmth, minimal erythema  Microbiology: Recent Results (from the past 240 hour(s))  CULTURE, BLOOD (ROUTINE X 2)     Status: Normal (Preliminary result)   Collection Time   06/09/12  1:15 PM      Component Value Range Status Comment   Specimen Description BLOOD ARM RIGHT   Final    Special Requests BOTTLES DRAWN AEROBIC ONLY 10CC   Final    Culture  Setup Time 06/09/2012 21:11   Final    Culture     Final    Value:        BLOOD CULTURE RECEIVED NO GROWTH TO DATE CULTURE WILL BE HELD FOR 5 DAYS BEFORE ISSUING A FINAL NEGATIVE REPORT   Report Status PENDING   Incomplete   CULTURE, BLOOD (ROUTINE X 2)     Status: Normal (Preliminary result)   Collection Time   06/09/12  1:18 PM      Component Value Range Status Comment   Specimen Description BLOOD HAND RIGHT   Final    Special Requests BOTTLES DRAWN AEROBIC ONLY 10CC   Final    Culture  Setup Time 06/09/2012 21:11   Final    Culture     Final    Value:        BLOOD CULTURE RECEIVED NO GROWTH TO DATE CULTURE WILL BE HELD FOR 5 DAYS BEFORE ISSUING A FINAL NEGATIVE REPORT   Report Status PENDING   Incomplete     Staci Righter, MD [facility] [phone] pager  [phone] cell 06/14/2012, 11:26 AM

## 2012-06-14 NOTE — Clinical Social Work Placement (Addendum)
    Clinical Social Work Department CLINICAL SOCIAL WORK PLACEMENT NOTE 06/14/2012  Patient:  [name], [name]  Account Number:  [id number] Admit date:  06/09/2012  Clinical Social Worker:  Hulan Fray  Date/time:  06/14/2012 01:53 PM  Clinical Social Work is seeking post-discharge placement for this patient at the following level of care:   SKILLED NURSING   (*CSW will update this form in Epic as items are completed)   06/14/2012  Patient/family provided with [facility] Department of Clinical Social Work's list of facilities offering this level of care within the geographic area requested by the patient (or if unable, by the patient's family).  06/14/2012  Patient/family informed of their freedom to choose among providers that offer the needed level of care, that participate in Medicare, Medicaid or managed care program needed by the patient, have an available bed and are willing to accept the patient.  06/14/2012  Patient/family informed of [facility]' ownership interest in [facility], as well as of the fact that they are under no obligation to receive care at this facility.  PASARR submitted to EDS on 12/24/2011 PASARR number received from [employer]DS on 12/24/2011  FL2 transmitted to all facilities in geographic area requested by pt/family on  06/14/2012 FL2 transmitted to all facilities within larger geographic area on   Patient informed that his/her managed care company has contracts with or will negotiate with  certain facilities, including the following:     Patient/family informed of bed offers received:  06-14-12  Patient chooses bed at [facility] Physician recommends and patient chooses bed at    Patient to be transferred to [facility] on 06-15-12   Patient to be transferred to facility by [facility]  The following physician request were entered in Epic:   Additional Comments:  Patient's insurance requires  prior approval before she can transfer to facility.

## 2012-06-14 NOTE — Progress Notes (Signed)
Stroke Team Progress Note  HISTORY  Brooke CUNANAN is an 51 y.o. female who initially was admitted to [facility] 06/09/2012 for management of RLE cellulitis that failed oral antibiotic treatment. She has a complicated PMHx and current problem list. During her stay she has had creatinine elevation consistent with AKI. Several potentially nephrotoxic meds have been stopped. The patient is HIV positive, with a viral load of <50 and CD4 count of 483 as of her 9/13 labs. She also has history of COPD, HTN, morbid obesit and chronic systolic CHF.   She has a history of prior hospitalization in April of this year for a posterior insular and posterior frontal lobe ischemic stroke on the right. She received tPA at that visit. She has been followed by [facility] for this. The patient has mild residual left-sided weakness and some speech difficulty secondary to the stroke. Early this AM, 06/13/2012, the covering physician for the admitting team was notified by Rapid Response RN that the patient was experiencing stroke-like symptoms.   Per patient, she awoke and her LLE was hanging off of the bed; she was unable to lift it back onto the bed. Her speech had also become more slurred. The patient stated that overall she felt just like she did before her prior stroke. Despite the weakness, the patient reports that she was still able to get up with assistance and walk to the bathroom. The patient also endorses that the above symptoms have been occurring intermittently since Friday night.   CT performed at 0215 this AM (10/8) revealed chronic infarcts in the right brain, with no acute intracranial abnormality, per the radiology report. On review, there is possible small late subacute left frontal subdural hematoma versus artifact.   No DVT was noted on ultrasound of her RLE yesterday, however, the study was suboptimal  Not a tpa candidate secondary to unknown time of onset.  SUBJECTIVE Patient crocheting in bed. No  complaints.  OBJECTIVE Most recent Vital Signs: Filed Vitals:   06/13/12 1351 06/13/12 1523 06/13/12 2137 06/14/12 0449  BP: 124/76 128/78 139/68 126/63  Pulse: 99 99 105 105  Temp: 98.4 F (36.9 C) 98.3 F (36.8 C) 99 F (37.2 C) 98.9 F (37.2 C)  TempSrc: Oral Oral Oral Oral  Resp: 20 20 20 17   Height:      Weight:      SpO2: 100% 100%  100%   CBG (last 3)  No results found for this basename: GLUCAP:3 in the last 72 hours Intake/Output from previous day: 10/08 0701 - 10/09 0700 In: 520 [P.O.:360; I.V.:160] Out: 1100 [Urine:1100]  IV Fluid Intake:     MEDICATIONS     . aspirin  325 mg Oral Daily  . carvedilol  3.125 mg Oral BID WC  . enoxaparin (LOVENOX) injection  40 mg Subcutaneous Q24H  . gabapentin  100 mg Oral TID  . isosorbide mononitrate  60 mg Oral Daily  . pantoprazole  40 mg Oral Daily  . traZODone  50 mg Oral QHS  . DISCONTD: DAPTOmycin (CUBICIN)  IV  500 mg Intravenous Q48H   PRN:  acetaminophen, acetaminophen, albuterol, albuterol, bisacodyl, diphenoxylate-atropine, HYDROmorphone (DILAUDID) injection, nitroGLYCERIN, ondansetron (ZOFRAN) IV, ondansetron, oxyCODONE, oxyCODONE-acetaminophen  Diet:  NPO Activity:  Up with assistance DVT Prophylaxis:  lovenox  CLINICALLY SIGNIFICANT STUDIES Basic Metabolic Panel:   Lab 06/14/12 0545 06/13/12 0652  NA 141 141  K 3.9 4.9  CL 109 109  CO2 23 23  GLUCOSE 119* 104*  BUN 30* 43*  CREATININE 1.56* 2.57*  CALCIUM 9.6 9.9  MG -- --  PHOS -- 3.9   Liver Function Tests:   Lab 06/13/12 0652 06/12/12 0650 06/09/12 1307  AST -- 23 34  ALT -- 19 28  ALKPHOS -- 92 101  BILITOT -- 0.2* 0.6  PROT -- 6.8 7.3  ALBUMIN 2.6* 2.5* --   CBC:   Lab 06/12/12 0650 06/09/12 1907 06/09/12 1307  WBC 9.1 8.6 --  NEUTROABS 6.5 -- 7.0  HGB 8.9* 9.4* --  HCT 28.2* 29.6* --  MCV 85.5 84.3 --  PLT 256 154 --   Coagulation: No results found for this basename: LABPROT:4,INR:4 in the last 168 hours Cardiac Enzymes:     Lab 06/13/12 2112 06/13/12 1535 06/13/12 1258 06/12/12 1220 06/10/12 1500 06/09/12 1500  CKTOTAL -- -- -- 625* 95 77  CKMB -- -- -- -- -- --  CKMBINDEX -- -- -- -- -- --  TROPONINI <0.30 <0.30 <0.30 -- -- --   Urinalysis:   Lab 06/11/12 1112  COLORURINE YELLOW  LABSPEC 1.015  PHURINE 5.0  GLUCOSEU NEGATIVE  HGBUR SMALL*  BILIRUBINUR SMALL*  KETONESUR NEGATIVE  PROTEINUR NEGATIVE  UROBILINOGEN 0.2  NITRITE NEGATIVE  LEUKOCYTESUR SMALL*   Lipid Panel    Component Value Date/Time   CHOL 156 06/13/2012 1247   TRIG 106 06/13/2012 1247   HDL 37* 06/13/2012 1247   CHOLHDL 4.2 06/13/2012 1247   VLDL 21 06/13/2012 1247   LDLCALC 98 06/13/2012 1247   HgbA1C  Lab Results  Component Value Date   HGBA1C 6.2* 06/13/2012    Urine Drug Screen:     Component Value Date/Time   LABOPIA POSITIVE* 05/24/2011 1800   LABOPIA NEGATIVE 05/23/2009 0542   COCAINSCRNUR POSITIVE* 05/24/2011 1800   COCAINSCRNUR NEGATIVE 05/23/2009 0542   LABBENZ NONE DETECTED 05/24/2011 1800   LABBENZ NEGATIVE 05/23/2009 0542   AMPHETMU NONE DETECTED 05/24/2011 1800   AMPHETMU NEGATIVE 05/23/2009 0542   THCU POSITIVE* 05/24/2011 1800   LABBARB NONE DETECTED 05/24/2011 1800    Alcohol Level: No results found for this basename: ETH:2 in the last 168 hours  Ct Head Wo Contrast 06/13/2012   Chronic infarcts in the right brain. No acute intracranial abnormality.     US Renal 06/12/2012  No acute process or explanation for renal failure     MR Cervical Spine C3-C4 left paracentral to left postero lateral disc protrusion with adjacent neural foraminal compromise with near contact with the exiting nerve root.  MRI of the brain  06/13/2013 1. Small focal area of restricted diffusion involving the left mid frontal parasagittal axis representing a small ischemic infarct. 2. Shine through artifact in the right mid frontal subcortical white matter related to remote ischemia. 3. Remote ischemia involving the right cerebellar  hemisphere. 4. Inflammatory thickening of the mucosa of the ethmoid air cells and frontal sinuses.  MRA of the brain completed, formal results pending. Per Pearlean Brownie, major vessels open  2D Echocardiogram  cancel  Carotid Doppler  bilateral proximal internal carotid arteries demonstrate increased velocities, but due to body habitus, cannot clearly evaluate presence of plaque in vessels. No obvious plaque noted.  CXR  Mild cardiomegally  Therapy Recommendations PT -CIR   Physical Exam   Middle aged african american lady not in distress.Awake alert. Afebrile. Head is nontraumatic. Neck is supple without bruit. Hearing is normal. Cardiac exam no murmur or gallop. Lungs are clear to auscultation. Distal pulses are well felt. Neurological Exam :  Awake alert oriented x 3  normal speech and language.Fundi not visualized. Vision acuity and fields appear intact. Mild left lower face asymmetry. Tongue midline. No drift but poor effort on left on strength testing.. Mild diminished fine finger movements on left. Orbits right over left upper extremity. Mild left grip weak.. Normal sensation . Normal coordination.   ASSESSMENT Ms. SHAMEKA [name] is a 51 y.o. female with inability to lift LLE back into bed, slurred speech. Imaging shows only chronic infacrtcs in the right brain. MRI shows a new Small left mid frontal parasagittal ischemic infarct, infarct silent in setting of medical illness.  On aspirin 81 mg orally every day prior to admission. Now on aspirin 325 mg orally every day for secondary stroke prevention. Patient with resultant left hemiparesis, speech impairment.   Small Left mid frontal parasagittal ischemic infarct-clinically silent  Polysusbstance abuse  Failed bedside swallow  Hypertension  Acute renal failure  Human immunodeficency virus  Right leg cellulitis, treated  Hospital day # 5  TREATMENT/PLAN  Continue aspirin 325 mg orally every day for secondary stroke prevention. No  new treatment indicated  Polysubstance abuse couselling  Ongoing Risk factor modification  D2 nectar thick diet per ST. They will assess swallow tomorrow  Will cancel 2D. No need to repeat. See one done in April.  SNF placement Stroke Service will sign off. Follow up with Dr. Pearlean Brownie, Stroke Clinic, in 2 months.  Annie Main, MSN, RN, ANVP-BC, ANP-BC, [facility] Pager: [phone] 06/14/2012 8:43 AM  Scribe for Dr. Delia Heady, Stroke Center Medical Director, who has personally reviewed chart, pertinent data, examined the patient and developed the plan of care. Pager:  [phone]

## 2012-06-14 NOTE — Progress Notes (Signed)
S: Still with pain in Rt foot. Appetite marginal O:BP 126/63  Pulse 105  Temp 98.9 F (37.2 C) (Oral)  Resp 17  Ht 5\' 2"  (1.575 m)  Wt 126.1 kg (278 lb)  BMI 50.85 kg/m2  SpO2 100%  LMP 05/14/2011  Intake/Output Summary (Last 24 hours) at 06/14/12 0850 Last data filed at 06/14/12 0500  Gross per 24 hour  Intake    520 ml  Output   1100 ml  Net   -580 ml   Weight change:  YQM:VHQIO and alert CVS:RRR Resp:clear Abd:+ BS NT ND Ext:Rt ankle swollen, less erythematous NEURO:CNI Ox3      . aspirin  325 mg Oral Daily  . carvedilol  3.125 mg Oral BID WC  . enoxaparin (LOVENOX) injection  40 mg Subcutaneous Q24H  . gabapentin  100 mg Oral TID  . isosorbide mononitrate  60 mg Oral Daily  . pantoprazole  40 mg Oral Daily  . traZODone  50 mg Oral QHS  . DISCONTD: DAPTOmycin (CUBICIN)  IV  500 mg Intravenous Q48H   Ct Head Wo Contrast  06/13/2012  *RADIOLOGY REPORT*  Clinical Data: 51 year old female with slurred speech.  CT HEAD WITHOUT CONTRAST  Technique:  Contiguous axial images were obtained from the base of the skull through the vertex without contrast.  Comparison: Brain MRI 12/19/2011 and earlier.  Findings: Visualized paranasal sinuses and mastoids are clear. Postoperative changes to the globes.  No acute scalp soft tissue abnormality. No acute osseous abnormality identified.  Patchy cortical encephalomalacia at the superior right sylvian fissure and central right frontal lobe white matter corresponding to the areas of diffusion abnormality in April.  Chronic right cerebellar infarct re-identified. No midline shift, mass effect, or evidence of mass lesion.  No ventriculomegaly. No acute intracranial hemorrhage identified.  No evidence of cortically based acute infarction identified.  Stable gray-white matter differentiation throughout the brain.  No suspicious intracranial vascular hyperdensity.  IMPRESSION: Chronic infarcts in the right brain. No acute intracranial abnormality.    Original Report Authenticated By: Harley Hallmark, M.D.    Mr Brain Wo Contrast  06/13/2012  *RADIOLOGY REPORT*  Clinical Data:  Leg weakness.  MRI HEAD WITHOUT CONTRAST  Technique:  Multiplanar, multiecho pulse sequences of the brain and surrounding structures were obtained according to standard protocol without intravenous contrast.  Comparison: CT scan of the brain of 06/13/2012.  Findings: Part of the study   is marred by motion artifact.  Gray-white matter differentiation is normal.  Partial empty sella is seen.  Clival signal is mildly heterogeneous.  Cerebellar tonsils are   just above the level of the foramen magnum.  The odontoid process, the predental space and the prevertebral soft tissues are within normal limits.  A single punctate focus of restricted diffusion is seen in the left mid  frontal parasagittal axis, with shine through artifact in the right posterior frontal subcortical region.  Axial FLAIR and T2-weighted images demonstrate mild prominence of the sulci overlying the cerebral convexities.  Area of increased signal is seen in the right posterior frontal subcortical white matter adjacent to the previous areas of ischemia.  This is seen to extend into the posterior limb of the right internal capsule.  Remote ischemic changes are  seen in the right cerebellar hemisphere extending into the inferior right cerebellar peduncle, surrounded by gliosis.  No abnormal blood breakdown products seen on the SPGR sequences.  Ventricles are normal.  Mastoids are well aerated.  The internal auditory canals are symmetrical  in signal and morphology.  Flow voids are maintained in the major vessels at the cranial skull base.  There is mild inflammatory thickening of the mucosa in the  ethmoid air cells, the frontal sinuses and  the maxillary sinuses.  Surgical intervention is suggested in the right eye globe region.  IMPRESSION: 1.  Small focal area of restricted diffusion involving the left mid frontal  parasagittal axis representing  a small ischemic infarct. 2.  Shine through artifact in the right mid frontal subcortical white matter related  to remote ischemia. 3.  Remote ischemia involving the right cerebellar hemisphere. 4. Inflammatory thickening of the mucosa of the ethmoid air cells and frontal sinuses.   Original Report Authenticated By: Oneal Grout, M.D.    Mr Cervical Spine Wo Contrast  06/13/2012  *RADIOLOGY REPORT*  Clinical Data:  Leg weakness.  MRI CERVICAL SPINE WITHOUT CONTRAST  Technique:  Multiplanar and multiecho pulse sequences of the cervical spine, to include the craniocervical junction and cervicothoracic junction, were obtained according to standard protocol without intravenous contrast.  Comparison: None.  Findings:  Alignment of the cervical spine is near anatomic.  Marrow signal is intact.  Disc  desiccation with disc space narrowing is noted at C6-C7, C 7 T1 and T1-T2.  Vertebral body heights are maintained.  The cranial vertebral junction demonstrates the odontoid process, the predental space and the prevertebral soft tissues to be normal.  Cerebellar tonsils are above the level of the foramen magnum.  The visualized cervical cord demonstrates normal thickness and signal.  Transaxial images demonstrate at  C3-C4  a left paracentral to the left postero  lateral disc protrusion with narrowing of the left neural foramen at this level. Near contact with the  exiting nerve root  is  seen.  The remaining disc levels demonstrate no evidence of  disc herniations or protrusions.  Impression 1. C3-C4 left paracentral to left postero  lateral disc protrusion with adjacent neural foraminal compromise with near contact with the exiting nerve root.  IMPRESSION:   Original Report Authenticated By: Oneal Grout, M.D.    US Renal  06/12/2012  *RADIOLOGY REPORT*  Clinical Data: Acute renal failure.  RENAL/URINARY TRACT ULTRASOUND COMPLETE  Comparison:  CT of 03/11/2012  Findings:   Right Kidney:  12.1 cm. No hydronephrosis.  Normal renal cortical thickness and echogenicity.  Right renal 1.6 cm lesion is technically indeterminate but most consistent with a cyst on the prior CT.  Left Kidney:  11.8 cm. No hydronephrosis.  Normal renal cortical thickness and echogenicity.  Bladder:  Within normal limits  IMPRESSION: No acute process or explanation for renal failure   Original Report Authenticated By: Consuello Bossier, M.D.    BMET    Component Value Date/Time   NA 141 06/14/2012 0545   K 3.9 06/14/2012 0545   CL 109 06/14/2012 0545   CO2 23 06/14/2012 0545   GLUCOSE 119* 06/14/2012 0545   BUN 30* 06/14/2012 0545   CREATININE 1.56* 06/14/2012 0545   CREATININE 0.76 04/11/2012 1217   CALCIUM 9.6 06/14/2012 0545   GFRNONAA 37* 06/14/2012 0545   GFRAA 43* 06/14/2012 0545   CBC    Component Value Date/Time   WBC 9.1 06/12/2012 0650   RBC 3.30* 06/12/2012 0650   HGB 8.9* 06/12/2012 0650   HCT 28.2* 06/12/2012 0650   PLT 256 06/12/2012 0650   MCV 85.5 06/12/2012 0650   MCH 27.0 06/12/2012 0650   MCHC 31.6 06/12/2012 0650   RDW 16.7* 06/12/2012  0650   LYMPHSABS 1.5 06/12/2012 0650   MONOABS 1.1* 06/12/2012 0650   EOSABS 0.1 06/12/2012 0650   BASOSABS 0.0 06/12/2012 0650     Assessment: 1. ARF most likely medication induced +/- infection,  UO excellent, renal fx improving and close to baseline 2. Rt ankle cellulitis 3. HIV 4. HTN  Plan: 1. Will sign off as renal fx close to baseline.  I would recommend talking with her ID Dr about how he wants to handle her HIV meds and Valtrex  Kassius Battiste T

## 2012-06-14 NOTE — Progress Notes (Addendum)
[facility] PROGRESS NOTE  ETOLA MULL ZOX:[id number] DOB: 05-22-1961 DOA: 06/09/2012 PCP: Quitman Livings, MD  Assessment/Plan: Active Problems:  HUMAN IMMUNODEFICIENCY VIRUS [HIV]  HYPERTENSION  COPD  Chronic systolic CHF (congestive heart failure)  Hypokalemia  Cellulitis of right leg  Creatinine elevation    *1. 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 RN reports speech unchanged by her assessment since admission. Ct head neg for acute process. Per neurology there may be small late subacute left frontal subdural hematoma versus artifact, there may be a possible extension of prior right hemispheric ischemic infarction.low suspicion for acute stroke. Not a tPA candidate, MRI recommended, will restart aspirin if MRI negative for subdural hematoma, MRI of the C-spine to rule out possible left hemicord lesion.  Recent 2-D echo done on 12/20/11 shows an EF of 30-35%, continue aspirin SNF placement Dysphagia 2 (Fine chop);Nectar-thick liquid     2. acute kidney injury creatinine improving likely secondary to multiple medications as documented in nephrology notes , nephrology has signed off Good urine output    3. Cellulitis, continue daptomycin continued due to increasing CK, switched to clindamycin by infectious disease, Doppler negative for DVT  4. HIV stribild and Valtrex on hold after consultation with infectious disease , Dr. Rubye Oaks has seen the patient  5. Hypertension blood pressure stable off antihypertensive medications   6. chronic systolic heart failure continue imdur and Coreg, holding lisinopril,  7. COPD  -Stable, continue bronchodilators    Code Status: full  Family Communication: family updated about patient's clinical progress  Disposition Plan: Social worker DC to SNF tomorrow possibly in  the morning    Consultants:  Nephrology  Neurology  Procedures:  As above Antibiotics:  dapto 10/05  vanc prior to that    HPI/Subjective:  Reported difficulty speaking and left leg weakness yesterday  Rapid response was called, no dysarthria or dysphagia noted  Pt also relates she has a ringing or "high frequency" sound in both ears that is intermittent but denies actual difficulty hearing. Improved today     Objective: Filed Vitals:   06/13/12 1351 06/13/12 1523 06/13/12 2137 06/14/12 0449  BP: 124/76 128/78 139/68 126/63  Pulse: 99 99 105 105  Temp: 98.4 F (36.9 C) 98.3 F (36.8 C) 99 F (37.2 C) 98.9 F (37.2 C)  TempSrc: Oral Oral Oral Oral  Resp: 20 20 20 17   Height:      Weight:      SpO2: 100% 100%  100%    Intake/Output Summary (Last 24 hours) at 06/14/12 1420 Last data filed at 06/14/12 0500  Gross per 24 hour  Intake    160 ml  Output      0 ml  Net    160 ml    Exam:  last menstrual period 05/14/2011, SpO2 100.00%.  General: Awake, in chair, nad  Skin: no rashes  Lungs: cta b  Cor: RRR  Ext - right leg with notable edema to ankle, warmth, minimal erythema    Data Reviewed: Basic Metabolic Panel:  Lab 06/14/12 8119 06/13/12 0652 06/12/12 0650 06/10/12 0820 06/09/12 1907 06/09/12 1307  NA 141 141 135 133* -- 138  K 3.9 4.9 4.6 4.0 -- 3.4*  CL 109 109 102 99 -- 102  CO2 23 23 21 27  -- 26  GLUCOSE 119* 104* 96 110* -- 100*  BUN 30* 43* 45* 20 --  12  CREATININE 1.56* 2.57* 3.76* 1.66* 1.14* --  CALCIUM 9.6 9.9 9.3 9.1 -- 10.0  MG -- -- -- -- -- --  PHOS -- 3.9 -- -- -- --    Liver Function Tests:  Lab 06/13/12 0652 06/12/12 0650 06/09/12 1307  AST -- 23 34  ALT -- 19 28  ALKPHOS -- 92 101  BILITOT -- 0.2* 0.6  PROT -- 6.8 7.3  ALBUMIN 2.6* 2.5* 3.0*   No results found for this basename: LIPASE:5,AMYLASE:5 in the last 168 hours No results found for this basename: AMMONIA:5 in the last 168 hours  CBC:  Lab 06/12/12 0650  06/09/12 1907 06/09/12 1307 06/08/12 1249  WBC 9.1 8.6 9.2 7.5  NEUTROABS 6.5 -- 7.0 6.1  HGB 8.9* 9.4* 10.5* 10.6*  HCT 28.2* 29.6* 32.7* 33.1*  MCV 85.5 84.3 83.6 84.7  PLT 256 154 153 135*    Cardiac Enzymes:  Lab 06/13/12 2112 06/13/12 1535 06/13/12 1258 06/12/12 1220 06/10/12 1500 06/09/12 1500  CKTOTAL -- -- -- 625* 95 77  CKMB -- -- -- -- -- --  CKMBINDEX -- -- -- -- -- --  TROPONINI <0.30 <0.30 <0.30 -- -- --   BNP (last 3 results)  Basename 04/23/12 2207 03/20/12 2011 01/31/12 1237  PROBNP 211.9* 311.5* 178.7*     CBG: No results found for this basename: GLUCAP:5 in the last 168 hours  Recent Results (from the past 240 hour(s))  CULTURE, BLOOD (ROUTINE X 2)     Status: Normal (Preliminary result)   Collection Time   06/09/12  1:15 PM      Component Value Range Status Comment   Specimen Description BLOOD ARM RIGHT   Final    Special Requests BOTTLES DRAWN AEROBIC ONLY 10CC   Final    Culture  Setup Time 06/09/2012 21:11   Final    Culture     Final    Value:        BLOOD CULTURE RECEIVED NO GROWTH TO DATE CULTURE WILL BE HELD FOR 5 DAYS BEFORE ISSUING A FINAL NEGATIVE REPORT   Report Status PENDING   Incomplete   CULTURE, BLOOD (ROUTINE X 2)     Status: Normal (Preliminary result)   Collection Time   06/09/12  1:18 PM      Component Value Range Status Comment   Specimen Description BLOOD HAND RIGHT   Final    Special Requests BOTTLES DRAWN AEROBIC ONLY 10CC   Final    Culture  Setup Time 06/09/2012 21:11   Final    Culture     Final    Value:        BLOOD CULTURE RECEIVED NO GROWTH TO DATE CULTURE WILL BE HELD FOR 5 DAYS BEFORE ISSUING A FINAL NEGATIVE REPORT   Report Status PENDING   Incomplete      Studies: Dg Chest 2 View  06/09/2012  *RADIOLOGY REPORT*  Clinical Data: Leg pain.  Swelling in right foot.  CHEST - 2 VIEW  Comparison: Chest radiograph 06/07/2012 and 04/04/2013and CT chest 12/22/2010  Findings: Mild cardiomegaly is stable. Left coronary artery  stent is visible.  Pulmonary vascularity is within normal limits.  The lungs are clear.  There is no pleural effusion.  The trachea is midline.  Schmorl's node in the inferior endplate of the T7 vertebral body is stable compared to prior chest CT; no acute bony abnormality is identified.  IMPRESSION:  1.  Stable mild cardiomegaly.  No acute findings. 2.  Coronary artery stent.  Original Report Authenticated By: Britta Mccreedy, M.D.    Dg Chest 2 View  06/07/2012  *RADIOLOGY REPORT*  Clinical Data: Cough and congestion.  Shortness of breath.  CHEST - 2 VIEW  Comparison: 04/23/2012  Findings: Lung volumes are low.  No edema or focal airspace consolidation. Cardiopericardial silhouette is at upper limits of normal for size. Imaged bony structures of the thorax are intact.  IMPRESSION: No acute cardiopulmonary findings.   Original Report Authenticated By: ERIC A. MANSELL, M.D.    Ct Head Wo Contrast  06/13/2012  *RADIOLOGY REPORT*  Clinical Data: 51 year old female with slurred speech.  CT HEAD WITHOUT CONTRAST  Technique:  Contiguous axial images were obtained from the base of the skull through the vertex without contrast.  Comparison: Brain MRI 12/19/2011 and earlier.  Findings: Visualized paranasal sinuses and mastoids are clear. Postoperative changes to the globes.  No acute scalp soft tissue abnormality. No acute osseous abnormality identified.  Patchy cortical encephalomalacia at the superior right sylvian fissure and central right frontal lobe white matter corresponding to the areas of diffusion abnormality in April.  Chronic right cerebellar infarct re-identified. No midline shift, mass effect, or evidence of mass lesion.  No ventriculomegaly. No acute intracranial hemorrhage identified.  No evidence of cortically based acute infarction identified.  Stable gray-white matter differentiation throughout the brain.  No suspicious intracranial vascular hyperdensity.  IMPRESSION: Chronic infarcts in the right  brain. No acute intracranial abnormality.   Original Report Authenticated By: Harley Hallmark, M.D.    Mr Brain Wo Contrast  06/13/2012  *RADIOLOGY REPORT*  Clinical Data:  Leg weakness.  MRI HEAD WITHOUT CONTRAST  Technique:  Multiplanar, multiecho pulse sequences of the brain and surrounding structures were obtained according to standard protocol without intravenous contrast.  Comparison: CT scan of the brain of 06/13/2012.  Findings: Part of the study   is marred by motion artifact.  Gray-white matter differentiation is normal.  Partial empty sella is seen.  Clival signal is mildly heterogeneous.  Cerebellar tonsils are   just above the level of the foramen magnum.  The odontoid process, the predental space and the prevertebral soft tissues are within normal limits.  A single punctate focus of restricted diffusion is seen in the left mid  frontal parasagittal axis, with shine through artifact in the right posterior frontal subcortical region.  Axial FLAIR and T2-weighted images demonstrate mild prominence of the sulci overlying the cerebral convexities.  Area of increased signal is seen in the right posterior frontal subcortical white matter adjacent to the previous areas of ischemia.  This is seen to extend into the posterior limb of the right internal capsule.  Remote ischemic changes are  seen in the right cerebellar hemisphere extending into the inferior right cerebellar peduncle, surrounded by gliosis.  No abnormal blood breakdown products seen on the SPGR sequences.  Ventricles are normal.  Mastoids are well aerated.  The internal auditory canals are symmetrical in signal and morphology.  Flow voids are maintained in the major vessels at the cranial skull base.  There is mild inflammatory thickening of the mucosa in the  ethmoid air cells, the frontal sinuses and  the maxillary sinuses.  Surgical intervention is suggested in the right eye globe region.  IMPRESSION: 1.  Small focal area of restricted  diffusion involving the left mid frontal parasagittal axis representing  a small ischemic infarct. 2.  Shine through artifact in the right mid frontal subcortical white matter related  to remote ischemia. 3.  Remote  ischemia involving the right cerebellar hemisphere. 4. Inflammatory thickening of the mucosa of the ethmoid air cells and frontal sinuses.   Original Report Authenticated By: Oneal Grout, M.D.    Mr Cervical Spine Wo Contrast  06/13/2012  *RADIOLOGY REPORT*  Clinical Data:  Leg weakness.  MRI CERVICAL SPINE WITHOUT CONTRAST  Technique:  Multiplanar and multiecho pulse sequences of the cervical spine, to include the craniocervical junction and cervicothoracic junction, were obtained according to standard protocol without intravenous contrast.  Comparison: None.  Findings:  Alignment of the cervical spine is near anatomic.  Marrow signal is intact.  Disc  desiccation with disc space narrowing is noted at C6-C7, C 7 T1 and T1-T2.  Vertebral body heights are maintained.  The cranial vertebral junction demonstrates the odontoid process, the predental space and the prevertebral soft tissues to be normal.  Cerebellar tonsils are above the level of the foramen magnum.  The visualized cervical cord demonstrates normal thickness and signal.  Transaxial images demonstrate at  C3-C4  a left paracentral to the left postero  lateral disc protrusion with narrowing of the left neural foramen at this level. Near contact with the  exiting nerve root  is  seen.  The remaining disc levels demonstrate no evidence of  disc herniations or protrusions.  Impression 1. C3-C4 left paracentral to left postero  lateral disc protrusion with adjacent neural foraminal compromise with near contact with the exiting nerve root.  IMPRESSION:   Original Report Authenticated By: Oneal Grout, M.D.    Ct Ankle Right Wo Contrast  06/09/2012  *RADIOLOGY REPORT*  Clinical Data:  Right foot and ankle pain and swelling.   Clinical concern for occult fracture.  CT RIGHT FOOT WITHOUT CONTRAST  Technique:  Multidetector CT imaging of the right foot was performed according to the standard protocol without intravenous contrast. Multiplanar CT image reconstructions were also generated.  Comparison:  Right foot radiographs dated 06/06/2012.  Findings:  Diffuse subcutaneous edema, most pronounced dorsally. No bone destruction or periosteal reaction.  Small cystic areas in the talus and lateral malleolus.  Inferior calcaneal spur.  IMPRESSION:  1.  No fracture or CT evidence of osteomyelitis. 2.  Diffuse subcutaneous edema, most pronounced dorsally. 3.  Small talar and lateral malleolus cysts. 4.  Inferior calcaneal spur.  CT RIGHT ANKLE WITHOUT CONTRAST  Technique:  Multidetector CT imaging of the right ankle was performed according to the standard protocol without intravenous contrast. Multiplanar CT image reconstructions were also generated.  Comparison:  Right ankle radiographs dated 12/09/2008.  Findings:  Diffuse subcutaneous edema, most pronounced anteriorly, laterally and medially.  Inferior calcaneal spur.  Small cysts in the talus and lateral malleolus.  No bone destruction or periosteal reaction.  No fracture or dislocation seen.  IMPRESSION:  1.  No fracture or CT evidence of osteomyelitis. 2.  Diffuse subcutaneous edema. 3.  Small talar and lateral malleolar cysts. 4.  Inferior calcaneal spur.   Original Report Authenticated By: Darrol Angel, M.D.    US Renal  06/12/2012  *RADIOLOGY REPORT*  Clinical Data: Acute renal failure.  RENAL/URINARY TRACT ULTRASOUND COMPLETE  Comparison:  CT of 03/11/2012  Findings:  Right Kidney:  12.1 cm. No hydronephrosis.  Normal renal cortical thickness and echogenicity.  Right renal 1.6 cm lesion is technically indeterminate but most consistent with a cyst on the prior CT.  Left Kidney:  11.8 cm. No hydronephrosis.  Normal renal cortical thickness and echogenicity.  Bladder:  Within normal  limits  IMPRESSION:  No acute process or explanation for renal failure   Original Report Authenticated By: Consuello Bossier, M.D.    Ct Foot Right Wo Contrast  06/09/2012  *RADIOLOGY REPORT*  Clinical Data:  Right foot and ankle pain and swelling.  Clinical concern for occult fracture.  CT RIGHT FOOT WITHOUT CONTRAST  Technique:  Multidetector CT imaging of the right foot was performed according to the standard protocol without intravenous contrast. Multiplanar CT image reconstructions were also generated.  Comparison:  Right foot radiographs dated 06/06/2012.  Findings:  Diffuse subcutaneous edema, most pronounced dorsally. No bone destruction or periosteal reaction.  Small cystic areas in the talus and lateral malleolus.  Inferior calcaneal spur.  IMPRESSION:  1.  No fracture or CT evidence of osteomyelitis. 2.  Diffuse subcutaneous edema, most pronounced dorsally. 3.  Small talar and lateral malleolus cysts. 4.  Inferior calcaneal spur.  CT RIGHT ANKLE WITHOUT CONTRAST  Technique:  Multidetector CT imaging of the right ankle was performed according to the standard protocol without intravenous contrast. Multiplanar CT image reconstructions were also generated.  Comparison:  Right ankle radiographs dated 12/09/2008.  Findings:  Diffuse subcutaneous edema, most pronounced anteriorly, laterally and medially.  Inferior calcaneal spur.  Small cysts in the talus and lateral malleolus.  No bone destruction or periosteal reaction.  No fracture or dislocation seen.  IMPRESSION:  1.  No fracture or CT evidence of osteomyelitis. 2.  Diffuse subcutaneous edema. 3.  Small talar and lateral malleolar cysts. 4.  Inferior calcaneal spur.   Original Report Authenticated By: Darrol Angel, M.D.    Dg Foot Complete Right  06/06/2012  *RADIOLOGY REPORT*  Clinical Data: Right foot pain  RIGHT FOOT COMPLETE - 3+ VIEW  Comparison: 12/09/2008 ankle radiographs  Findings: Lisfranc joint intact.  No displaced fracture or dislocation.   No aggressive osseous lesions.  Plantar arch and enthesopathic change.  Mild midfoot DJD.  IMPRESSION: No acute or aggressive osseous abnormality of the right foot.   Original Report Authenticated By: Waneta Martins, M.D.     Scheduled Meds:   . aspirin  325 mg Oral Daily  . carvedilol  3.125 mg Oral BID WC  . clindamycin (CLEOCIN) IV  900 mg Intravenous Q8H  . enoxaparin (LOVENOX) injection  40 mg Subcutaneous Q24H  . gabapentin  100 mg Oral TID  .  HYDROmorphone (DILAUDID) injection  2 mg Intravenous Once  . isosorbide mononitrate  60 mg Oral Daily  . pantoprazole  40 mg Oral Daily  . traZODone  50 mg Oral QHS   Continuous Infusions:   Active Problems:  HUMAN IMMUNODEFICIENCY VIRUS [HIV]  HYPERTENSION  COPD  Chronic systolic CHF (congestive heart failure)  Hypokalemia  Cellulitis of right leg  Creatinine elevation    Time spent: 40 minutes   [facility]  [facility] Pager [phone]. If 8PM-8AM, please contact night-coverage at [email or web address], password [facility] 06/14/2012, 2:20 PM  LOS: 5 days

## 2012-06-15 LAB — CULTURE, BLOOD (ROUTINE X 2): Culture: NO GROWTH

## 2012-06-15 LAB — URIC ACID: Uric Acid, Serum: 7.3 mg/dL — ABNORMAL HIGH (ref 2.4–7.0)

## 2012-06-15 MED ORDER — LUBIPROSTONE 24 MCG PO CAPS
24.0000 ug | ORAL_CAPSULE | Freq: Two times a day (BID) | ORAL | Status: DC
  Filled 2012-06-15 (×2): qty 1

## 2012-06-15 MED ORDER — HYDROCODONE-ACETAMINOPHEN 5-500 MG PO TABS: 1.0000 | ORAL_TABLET | Freq: Four times a day (QID) | ORAL | Status: DC | PRN

## 2012-06-15 MED ORDER — GABAPENTIN 300 MG PO CAPS: 300.0000 mg | ORAL_CAPSULE | Freq: Three times a day (TID) | ORAL | Status: DC

## 2012-06-15 MED ORDER — CLOPIDOGREL BISULFATE 75 MG PO TABS: 75.0000 mg | ORAL_TABLET | Freq: Every day | ORAL | Status: DC

## 2012-06-15 MED ORDER — BISACODYL 5 MG PO TBEC: 10.0000 mg | DELAYED_RELEASE_TABLET | Freq: Every day | ORAL | Status: DC | PRN

## 2012-06-15 MED ORDER — CLINDAMYCIN HCL 300 MG PO CAPS: 300.0000 mg | ORAL_CAPSULE | Freq: Four times a day (QID) | ORAL | Status: AC

## 2012-06-15 NOTE — Clinical Social Work Note (Signed)
Clinical Child psychotherapist received approval from [employer]. CSW will call EMS for transport to [facility]. CSW will sign off as social work intervention is no longer needed.   Rozetta Nunnery MSW, [employer] [phone]

## 2012-06-15 NOTE — Progress Notes (Signed)
Pt discharged to SNF via ambulance. IV discontinued

## 2012-06-15 NOTE — Progress Notes (Signed)
SLP has reviewed and agrees with student's note below.  Breck Coons Allison.Ed [employer] [phone]  06/15/2012

## 2012-06-15 NOTE — Progress Notes (Signed)
Patient to be discharged today to SNF, will continue to hold her ARV (stribild) and follow up with her at [facility] (ID office) next week to consider new regimen, pending HLA testing and rechecking her creat.

## 2012-06-15 NOTE — Progress Notes (Signed)
Speech Language Pathology Dysphagia Treatment Patient Details Name: CAPRINA WUSSOW MRN: [id number] DOB: 08-30-1961 Today's Date: 06/15/2012 Time:  -     Assessment / Plan / Recommendation Clinical Impression  Pt. seen for dysphagia treatment focusing skilled observation of upgraded PO trials with thin and aspiration risk education. Pt. reported less coughing and throat clearing with nectar consistency.  Pt. reached for large pitcher (from home) with straw and began drinking and stated it was regular coke without thickener and said "you're not going to thicken my coke."  SLP explained s/s of aspiration observed yesterday with thin liquids and rationale for thick liquids. Pt. consumed thickened Sprite with throat clear x 1.  Pt. was trialed with thin water to determine ability to upgrade.  Immediate and delayed throat clears observed with thin liquids, indicative of pharyngeal residue and/or decreased airway protection.   Recommend objective evaluation of swallow with FEES which pt. was agreeable to.  Pt. likely transferring to SNF this afternoon.  If pt. here tomorrow will perform FEES.  If she discharges this p.m., recommend FEES at SNF (mobile FEES?).  MD aware of recommendations.  Contine Dys 2 and nectar thick.     Diet Recommendation  Continue with Current Diet: Dysphagia 2 (fine chop);Nectar-thick liquid    SLP Plan FEES;Other (Comment) (Pt. is scheduled for discharge this afternoon)      Swallowing Goals  SLP Swallowing Goals Patient will consume recommended diet without observed clinical signs of aspiration with: Minimal cueing Swallow Study Goal #1 - Progress: Progressing toward goal Patient will utilize recommended strategies during swallow to increase swallowing safety with: Minimal cueing Swallow Study Goal #2 - Progress: Progressing toward goal  General Temperature Spikes Noted: No Respiratory Status: Room air Behavior/Cognition: Alert;Cooperative Oral Cavity - Dentition:  Missing dentition Patient Positioning: Upright in bed  Oral Cavity - Oral Hygiene Does patient have any of the following "at risk" factors?: Other - dysphagia;Diet - patient on thickened liquids Brush patient's teeth BID with toothbrush (using toothpaste with fluoride): Yes   Dysphagia Treatment Treatment focused on: Skilled observation of diet tolerance;Upgraded PO texture trials;Patient/family/caregiver education Treatment Methods/Modalities: Skilled observation;Differential diagnosis Patient observed directly with PO's: Yes Type of PO's observed: Nectar-thick liquids;Thin liquids Feeding: Able to feed self Liquids provided via: Cup Pharyngeal Phase Signs & Symptoms: Suspected delayed swallow initiation;Multiple swallows;Audible swallow;Immediate throat clear;Delayed throat clear Type of cueing: Verbal Amount of cueing: Minimal   GO     Theotis Burrow 06/15/2012, 2:26 PM

## 2012-06-15 NOTE — Progress Notes (Signed)
Occupational Therapy Treatment Patient Details Name: [name] MRN: [id number] DOB: 1960/09/24 Today's Date: 06/15/2012 Time: 5621-3086 OT Time Calculation (min): 46 min  OT Assessment / Plan / Recommendation Comments on Treatment Session Pt not really making progress due to increased pain in RLE (ankle and lower leg), uric acids were checked and are abnormal. Feel this being treated will greatly increase patients abilities.    Follow Up Recommendations  Skilled nursing facility       Equipment Recommendations  Rolling walker with 5" wheels;Tub/shower bench       Frequency Min 2X/week   Plan Discharge plan remains appropriate    Precautions / Restrictions Precautions Precautions: Fall Restrictions Weight Bearing Restrictions: No Other Position/Activity Restrictions: However pt unable to bear weight on RLE today, uric acid test came back abnomral   Pertinent Vitals/Pain 5/10 RLE with movement    ADL  ADL Comments: Pt very talkative which does increase time needed to be spent with her. Also towards the end of the session the patient's oldest daughter and young grandson came into the room. The patient asked me to get them two belonging bags--when I returned they were using rasied voices to each other that could be heard out in the hall. I tried to get them to calm down, but could not so the Chiropodist came into the room and asked them to stop yelling at each other since it was disturbing other patients. Seems to be a very dysfunctional relationship.      OT Goals ADL Goals ADL Goal: Grooming - Progress:  (Pt reported she wanted to finish eating and then do this. Pt sitting EOB eating and drinking without difficulty of balance or use of Bil UEs))  Visit Information  Last OT Received On: 06/15/12 Assistance Needed: +2 (if up and OOB)    Subjective Data  Subjective: Don't touch it (RLE), it hurts      Cognition  Overall Cognitive Status: Appears within  functional limits for tasks assessed/performed Arousal/Alertness: Awake/alert Orientation Level: Appears intact for tasks assessed Behavior During Session: [facility] for tasks performed    Mobility  Shoulder Instructions Bed Mobility Bed Mobility: Supine to Sit Supine to Sit: 6: Modified independent (Device/Increase time);HOB flat;With rails (Took about 20 minutes to get to the EOB from supine) Sitting - Scoot to Edge of Bed: 6: Modified independent (Device/Increase time);With rail          Balance Balance Balance Assessed: Yes Dynamic Sitting Balance Dynamic Sitting - Balance Support: No upper extremity supported;Feet unsupported Dynamic Sitting - Level of Assistance: 7: Independent   End of Session OT - End of Session Equipment Utilized During Treatment:  (None only to EOB) Activity Tolerance: Patient limited by pain Patient left: in bed Nurse Communication:  (Pt left sitting EOB--to call when ready to lay down, incident between daughter and patient relayed to nurse)       Evette Georges [phone] 06/15/2012, 4:33 PM

## 2012-06-15 NOTE — Clinical Social Work Note (Signed)
Clinical Child psychotherapist facilitated discharge by contacting facility, [facility]. Patient will be transported via [facility] [facility]. Patient's discharge packet will be placed with patient's shadow chart. CSW is awaiting [employer] authorization from [employer] company before transfer to SNF.   Rozetta Nunnery MSW, [employer] [phone]

## 2012-06-15 NOTE — Evaluation (Signed)
SLP has reviewed and agrees with student's note below.  Cheyrl Buley Willis Radiah Lubinski M.Ed CCC-SLP Pager [phone]  06/15/2012  

## 2012-06-15 NOTE — Discharge Summary (Addendum)
Physician Discharge Summary  [name] MRN: [id number] DOB/AGE: April 13, 1961 51 y.o.  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   HUMAN IMMUNODEFICIENCY VIRUS [HIV]  HYPERTENSION  COPD  Chronic systolic CHF (congestive heart failure)  Hypokalemia  Cellulitis of right leg  Acute kidney injury improving     Medication List     As of 06/15/2012 10:08 AM    STOP taking these medications         aspirin EC 81 MG tablet      hydrochlorothiazide 25 MG tablet   Commonly known as: HYDRODIURIL      ibuprofen 200 MG tablet   Commonly known as: ADVIL,MOTRIN      lisinopril 20 MG tablet   Commonly known as: PRINIVIL,ZESTRIL      oxyCODONE-acetaminophen 10-325 MG per tablet   Commonly known as: PERCOCET      sulfamethoxazole-trimethoprim 800-160 MG per tablet   Commonly known as: BACTRIM DS,SEPTRA DS      valACYclovir 500 MG tablet   Commonly known as: VALTREX      TAKE these medications         albuterol 108 (90 BASE) MCG/ACT inhaler   Commonly known as: PROVENTIL HFA;VENTOLIN HFA   Inhale 2 puffs into the lungs every 6 (six) hours as needed. For wheezing and shortness of breath      albuterol (5 MG/ML) 0.5% nebulizer solution   Commonly known as: PROVENTIL   Take 0.5 mLs (2.5 mg total) by nebulization every 2 (two) hours as needed for wheezing.      bisacodyl 5 MG EC tablet   Commonly known as: DULCOLAX   Take 2 tablets (10 mg total) by mouth daily as needed.      carvedilol 3.125 MG tablet   Commonly known as: COREG   Take 3.125 mg by mouth 2 (two) times daily with a meal.      clindamycin 300 MG capsule   Commonly known as: CLEOCIN   Take 1 capsule (300 mg total) by mouth 4 (four) times daily.      clopidogrel 75 MG tablet   Commonly known as: PLAVIX   Take 1 tablet (75 mg total) by mouth daily.     diphenoxylate-atropine 2.5-0.025 MG per tablet   Commonly known as: LOMOTIL   Take 1 tablet by mouth 4 (four) times daily as needed. For loose stool        elvitegravir-cobicistat-emtricitabine-tenofovir 150-150-200-300 MG Tabs   Commonly known as: STRIBILD   Take 1 tablet by mouth daily with breakfast.  To BE held until seen by infectious disease clinic in 1 week        gabapentin 300 MG capsule   Commonly known as: NEURONTIN   Take 1 capsule (300 mg total) by mouth 3 (three) times daily.      HYDROcodone-acetaminophen 5-500 MG per tablet   Commonly known as: VICODIN   Take 1-2 tablets by mouth every 6 (six) hours as needed. For pain      isosorbide mononitrate 60 MG 24 hr tablet   Commonly known as: IMDUR   Take 60 mg by mouth daily.      nitroGLYCERIN 0.3 MG SL tablet   Commonly known as: NITROSTAT   Place 0.3 mg under the tongue every 5 (five) minutes as needed. For chest pain      pantoprazole 20 MG tablet   Commonly known  as: PROTONIX   Take 40 mg by mouth daily.      rosuvastatin 5 MG tablet   Commonly known as: CRESTOR   Take 5 mg by mouth daily at 6 PM.      traZODone 50 MG tablet   Commonly known as: DESYREL   Take 1 tablet (50 mg total) by mouth at bedtime.        Discharge Condition: Stable  Disposition: Skilled nursing facility    Consults:  #1 infectious disease #2 nephrology     Significant Diagnostic Studies: Dg Chest 2 View  06/09/2012  *RADIOLOGY REPORT*  Clinical Data: Leg pain.  Swelling in right foot.  CHEST - 2 VIEW  Comparison: Chest radiograph 06/07/2012 and 04/04/2013and CT chest 12/22/2010  Findings: Mild cardiomegaly is stable. Left coronary artery stent is visible.  Pulmonary vascularity is within normal limits.  The lungs are clear.  There is no pleural effusion.  The trachea is midline.  Schmorl's node in the inferior endplate of the T7 vertebral body is stable compared to prior chest CT; no acute bony abnormality is  identified.  IMPRESSION:  1.  Stable mild cardiomegaly.  No acute findings. 2.  Coronary artery stent.   Original Report Authenticated By: Britta Mccreedy, M.D.    Dg Chest 2 View  06/07/2012  *RADIOLOGY REPORT*  Clinical Data: Cough and congestion.  Shortness of breath.  CHEST - 2 VIEW  Comparison: 04/23/2012  Findings: Lung volumes are low.  No edema or focal airspace consolidation. Cardiopericardial silhouette is at upper limits of normal for size. Imaged bony structures of the thorax are intact.  IMPRESSION: No acute cardiopulmonary findings.   Original Report Authenticated By: ERIC A. MANSELL, M.D.    Ct Head Wo Contrast  06/13/2012  *RADIOLOGY REPORT*  Clinical Data: 51 year old female with slurred speech.  CT HEAD WITHOUT CONTRAST  Technique:  Contiguous axial images were obtained from the base of the skull through the vertex without contrast.  Comparison: Brain MRI 12/19/2011 and earlier.  Findings: Visualized paranasal sinuses and mastoids are clear. Postoperative changes to the globes.  No acute scalp soft tissue abnormality. No acute osseous abnormality identified.  Patchy cortical encephalomalacia at the superior right sylvian fissure and central right frontal lobe white matter corresponding to the areas of diffusion abnormality in April.  Chronic right cerebellar infarct re-identified. No midline shift, mass effect, or evidence of mass lesion.  No ventriculomegaly. No acute intracranial hemorrhage identified.  No evidence of cortically based acute infarction identified.  Stable gray-white matter differentiation throughout the brain.  No suspicious intracranial vascular hyperdensity.  IMPRESSION: Chronic infarcts in the right brain. No acute intracranial abnormality.   Original Report Authenticated By: Harley Hallmark, M.D.    Mr Maxine Glenn Head Wo Contrast  06/15/2012  *RADIOLOGY REPORT*  Clinical Data:  Leg weakness.  MRA HEAD WITHOUT CONTRAST  Technique: Angiographic images of the Circle of Willis were  obtained using MRA technique without intravenous contrast.  Comparison: None.  Findings:  Source images are also evaluated.  Patient motion precludes fine detail evaluation, on reconstruction images.  However grossly the internal carotid arteries in the petrous, cavernous and supraclinoid segments demonstrate wide patency.  The right posterior communicating artery is identified.  Flow signal is demonstrated in the middle cerebral arteries to the trifurcation branches bilaterally, and the anterior cerebral arteries to the mid A2 segments.  Caliber irregularity is seen in these vessels.  The anterior communicating artery is widely patent.  Vertebrobasilar junctions demonstrate wide patency.  The basilar artery, the posterior cerebral arteries and the superior cerebellar arteries  demonstrate flow signal.  Flow signal is also noted in the left anterior inferior cerebellar artery.  IMPRESSION: 1.  Grossly wide patency of the vasculature of the visualized circle of Willis. 2.Mild caliber irregularity noted on the study probably represents artifact related to patient motion and technique.   Original Report Authenticated By: Oneal Grout, M.D.    Mr Brain Wo Contrast  06/13/2012  *RADIOLOGY REPORT*  Clinical Data:  Leg weakness.  MRI HEAD WITHOUT CONTRAST  Technique:  Multiplanar, multiecho pulse sequences of the brain and surrounding structures were obtained according to standard protocol without intravenous contrast.  Comparison: CT scan of the brain of 06/13/2012.  Findings: Part of the study   is marred by motion artifact.  Gray-white matter differentiation is normal.  Partial empty sella is seen.  Clival signal is mildly heterogeneous.  Cerebellar tonsils are   just above the level of the foramen magnum.  The odontoid process, the predental space and the prevertebral soft tissues are within normal limits.  A single punctate focus of restricted diffusion is seen in the left mid  frontal parasagittal axis,  with shine through artifact in the right posterior frontal subcortical region.  Axial FLAIR and T2-weighted images demonstrate mild prominence of the sulci overlying the cerebral convexities.  Area of increased signal is seen in the right posterior frontal subcortical white matter adjacent to the previous areas of ischemia.  This is seen to extend into the posterior limb of the right internal capsule.  Remote ischemic changes are  seen in the right cerebellar hemisphere extending into the inferior right cerebellar peduncle, surrounded by gliosis.  No abnormal blood breakdown products seen on the SPGR sequences.  Ventricles are normal.  Mastoids are well aerated.  The internal auditory canals are symmetrical in signal and morphology.  Flow voids are maintained in the major vessels at the cranial skull base.  There is mild inflammatory thickening of the mucosa in the  ethmoid air cells, the frontal sinuses and  the maxillary sinuses.  Surgical intervention is suggested in the right eye globe region.  IMPRESSION: 1.  Small focal area of restricted diffusion involving the left mid frontal parasagittal axis representing  a small ischemic infarct. 2.  Shine through artifact in the right mid frontal subcortical white matter related  to remote ischemia. 3.  Remote ischemia involving the right cerebellar hemisphere. 4. Inflammatory thickening of the mucosa of the ethmoid air cells and frontal sinuses.   Original Report Authenticated By: Oneal Grout, M.D.    Mr Cervical Spine Wo Contrast  06/13/2012  *RADIOLOGY REPORT*  Clinical Data:  Leg weakness.  MRI CERVICAL SPINE WITHOUT CONTRAST  Technique:  Multiplanar and multiecho pulse sequences of the cervical spine, to include the craniocervical junction and cervicothoracic junction, were obtained according to standard protocol without intravenous contrast.  Comparison: None.  Findings:  Alignment of the cervical spine is near anatomic.  Marrow signal is intact.  Disc   desiccation with disc space narrowing is noted at C6-C7, C 7 T1 and T1-T2.  Vertebral body heights are maintained.  The cranial vertebral junction demonstrates the odontoid process, the predental space and the prevertebral soft tissues to be normal.  Cerebellar tonsils are above the level of the foramen magnum.  The visualized cervical cord demonstrates normal thickness and signal.  Transaxial images demonstrate at  C3-C4  a left paracentral to the left postero  lateral disc  protrusion with narrowing of the left neural foramen at this level. Near contact with the  exiting nerve root  is  seen.  The remaining disc levels demonstrate no evidence of  disc herniations or protrusions.  Impression 1. C3-C4 left paracentral to left postero  lateral disc protrusion with adjacent neural foraminal compromise with near contact with the exiting nerve root.  IMPRESSION:   Original Report Authenticated By: Oneal Grout, M.D.    Ct Ankle Right Wo Contrast  06/09/2012  *RADIOLOGY REPORT*  Clinical Data:  Right foot and ankle pain and swelling.  Clinical concern for occult fracture.  CT RIGHT FOOT WITHOUT CONTRAST  Technique:  Multidetector CT imaging of the right foot was performed according to the standard protocol without intravenous contrast. Multiplanar CT image reconstructions were also generated.  Comparison:  Right foot radiographs dated 06/06/2012.  Findings:  Diffuse subcutaneous edema, most pronounced dorsally. No bone destruction or periosteal reaction.  Small cystic areas in the talus and lateral malleolus.  Inferior calcaneal spur.  IMPRESSION:  1.  No fracture or CT evidence of osteomyelitis. 2.  Diffuse subcutaneous edema, most pronounced dorsally. 3.  Small talar and lateral malleolus cysts. 4.  Inferior calcaneal spur.  CT RIGHT ANKLE WITHOUT CONTRAST  Technique:  Multidetector CT imaging of the right ankle was performed according to the standard protocol without intravenous contrast. Multiplanar CT  image reconstructions were also generated.  Comparison:  Right ankle radiographs dated 12/09/2008.  Findings:  Diffuse subcutaneous edema, most pronounced anteriorly, laterally and medially.  Inferior calcaneal spur.  Small cysts in the talus and lateral malleolus.  No bone destruction or periosteal reaction.  No fracture or dislocation seen.  IMPRESSION:  1.  No fracture or CT evidence of osteomyelitis. 2.  Diffuse subcutaneous edema. 3.  Small talar and lateral malleolar cysts. 4.  Inferior calcaneal spur.   Original Report Authenticated By: Darrol Angel, M.D.    US Renal  06/12/2012  *RADIOLOGY REPORT*  Clinical Data: Acute renal failure.  RENAL/URINARY TRACT ULTRASOUND COMPLETE  Comparison:  CT of 03/11/2012  Findings:  Right Kidney:  12.1 cm. No hydronephrosis.  Normal renal cortical thickness and echogenicity.  Right renal 1.6 cm lesion is technically indeterminate but most consistent with a cyst on the prior CT.  Left Kidney:  11.8 cm. No hydronephrosis.  Normal renal cortical thickness and echogenicity.  Bladder:  Within normal limits  IMPRESSION: No acute process or explanation for renal failure   Original Report Authenticated By: Consuello Bossier, M.D.    Ct Foot Right Wo Contrast  06/09/2012  *RADIOLOGY REPORT*  Clinical Data:  Right foot and ankle pain and swelling.  Clinical concern for occult fracture.  CT RIGHT FOOT WITHOUT CONTRAST  Technique:  Multidetector CT imaging of the right foot was performed according to the standard protocol without intravenous contrast. Multiplanar CT image reconstructions were also generated.  Comparison:  Right foot radiographs dated 06/06/2012.  Findings:  Diffuse subcutaneous edema, most pronounced dorsally. No bone destruction or periosteal reaction.  Small cystic areas in the talus and lateral malleolus.  Inferior calcaneal spur.  IMPRESSION:  1.  No fracture or CT evidence of osteomyelitis. 2.  Diffuse subcutaneous edema, most pronounced dorsally. 3.  Small  talar and lateral malleolus cysts. 4.  Inferior calcaneal spur.  CT RIGHT ANKLE WITHOUT CONTRAST  Technique:  Multidetector CT imaging of the right ankle was performed according to the standard protocol without intravenous contrast. Multiplanar CT image reconstructions were also generated.  Comparison:  Right ankle radiographs dated 12/09/2008.  Findings:  Diffuse subcutaneous edema, most pronounced anteriorly, laterally and medially.  Inferior calcaneal spur.  Small cysts in the talus and lateral malleolus.  No bone destruction or periosteal reaction.  No fracture or dislocation seen.  IMPRESSION:  1.  No fracture or CT evidence of osteomyelitis. 2.  Diffuse subcutaneous edema. 3.  Small talar and lateral malleolar cysts. 4.  Inferior calcaneal spur.   Original Report Authenticated By: Darrol Angel, M.D.    Dg Foot Complete Right  06/06/2012  *RADIOLOGY REPORT*  Clinical Data: Right foot pain  RIGHT FOOT COMPLETE - 3+ VIEW  Comparison: 12/09/2008 ankle radiographs  Findings: Lisfranc joint intact.  No displaced fracture or dislocation.  No aggressive osseous lesions.  Plantar arch and enthesopathic change.  Mild midfoot DJD.  IMPRESSION: No acute or aggressive osseous abnormality of the right foot.   Original Report Authenticated By: Waneta Martins, M.D.        Microbiology: Recent Results (from the past 240 hour(s))  CULTURE, BLOOD (ROUTINE X 2)     Status: Normal   Collection Time   06/09/12  1:15 PM      Component Value Range Status Comment   Specimen Description BLOOD ARM RIGHT   Final    Special Requests BOTTLES DRAWN AEROBIC ONLY 10CC   Final    Culture  Setup Time 06/09/2012 21:11   Final    Culture NO GROWTH 5 DAYS   Final    Report Status 06/15/2012 FINAL   Final   CULTURE, BLOOD (ROUTINE X 2)     Status: Normal   Collection Time   06/09/12  1:18 PM      Component Value Range Status Comment   Specimen Description BLOOD HAND RIGHT   Final    Special Requests BOTTLES DRAWN  AEROBIC ONLY 10CC   Final    Culture  Setup Time 06/09/2012 21:11   Final    Culture NO GROWTH 5 DAYS   Final    Report Status 06/15/2012 FINAL   Final      Labs: Results for orders placed during the hospital encounter of 06/09/12 (from the past 48 hour(s))  LIPID PANEL     Status: Abnormal   Collection Time   06/13/12 12:47 PM      Component Value Range Comment   Cholesterol 156  0 - 200 mg/dL    Triglycerides 161  <096 mg/dL    HDL 37 (*) >04 mg/dL    Total CHOL/HDL Ratio 4.2      VLDL 21  0 - 40 mg/dL    LDL Cholesterol 98  0 - 99 mg/dL   TROPONIN I     Status: Normal   Collection Time   06/13/12 12:58 PM      Component Value Range Comment   Troponin I <0.30  <0.30 ng/mL   HEMOGLOBIN A1C     Status: Abnormal   Collection Time   06/13/12 12:58 PM      Component Value Range Comment   Hemoglobin A1C 6.2 (*) <5.7 %    Mean Plasma Glucose 131 (*) <117 mg/dL   TROPONIN I     Status: Normal   Collection Time   06/13/12  3:35 PM      Component Value Range Comment   Troponin I <0.30  <0.30 ng/mL   TROPONIN I     Status: Normal   Collection Time   06/13/12  9:12 PM  Component Value Range Comment   Troponin I <0.30  <0.30 ng/mL   BASIC METABOLIC PANEL     Status: Abnormal   Collection Time   06/14/12  5:45 AM      Component Value Range Comment   Sodium 141  135 - 145 mEq/L    Potassium 3.9  3.5 - 5.1 mEq/L DELTA CHECK NOTED   Chloride 109  96 - 112 mEq/L    CO2 23  19 - 32 mEq/L    Glucose, Bld 119 (*) 70 - 99 mg/dL    BUN 30 (*) 6 - 23 mg/dL    Creatinine, Ser 1.61 (*) 0.50 - 1.10 mg/dL DELTA CHECK NOTED   Calcium 9.6  8.4 - 10.5 mg/dL    GFR calc non Af Amer 37 (*) >90 mL/min    GFR calc Af Amer 43 (*) >90 mL/min      HPI :[name] is a 51 y.o. female with history of HIV- last CD4 count 610 and 04/2012, hypertension, COPD who presented with complaints of increasing right lower extremity pain and swelling. It is noted that she's had 2 prior ED visits this week  for same-last yesterday 10/3 and was discharged on Bactrim patient states that the pain swelling and redness worsened even with antibiotics and to the extent that she was having difficulty walking and so she came back today. She admits to fevers x 1week. She denies cough, dysuria, and no history of trauma. On 10/to ED visit she had x-rays of the right foot which came back negative for acute findings.      HOSPITAL COURSE:   #1 cellulitis CT of the ankle and the foot did not show any obvious osteomyelitis, fracture, it showed diffuse subcutaneous edema consistent with cellulitis Patient was initially started on vancomycin, given renal insufficiency she was switched to daptomycin, given the fact that her CK started going up, infectious disease was consulted, Doppler ultrasound was negative for DVT Infectious disease recommended clindamycin will continue for another 10 days Infectious disease did not feel that linezolid was indicated Uric acid has been ordered and is pending Patient may have underlying gout as well If elevated would recommend a course of prednisone 20 mg for 5 days     #2 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 RN reports speech unchanged by her assessment since admission.  Stroke workup was initiated, neurology was consulted Ct head neg for acute process. Per neurology there may be small late subacute left frontal subdural hematoma versus artifact, there may be a possible extension of prior right hemispheric ischemic infarction.low suspicion for acute stroke. Not a tPA candidate,  Neurology recommended MRI, the patient off aspirin, os note is that the patient was on aspirin at home 81 mg a day she was increased to 325 mg a day the acute setting,  subsequently switched to Plavix 75 mg per day upon discharge  Currently note the results of MRI of the brain, C-spine Recent 2-D echo done on 12/20/11 shows an EF of 30-35%, SNF placement  slp consult recommended Dysphagia 2 (Fine chop);Nectar-thick liquid  She needs FEES FIBEROPTIC ENDOSCOPIC EVALUATION of swallowing outpatient    #3 acute renal failure Creatinine increase from 1.1 4 to 3.76 in a matter of 2 days She maintained good urine output Renal ultrasound not  showing hydronephrosis Nephrology was consulted Acute renal failure was thought to be a combination of medications, her Valtrex, striblid, ACE inhibitor, NSAIDs, Bactrim, hydrochlorothiazide all of which were discontinued  Renal function improved gradually Dosage of Neurontin was adjusted based on renal insufficiency   #4 HIV Infectious disease was consulted, her HIV medications and Valtrex were held after discussion with Dr.,Robert Jackolyn Confer, MD He recommend followup with infectious disease clinic in one week    Discharge Exam:  Blood pressure 112/42, pulse 94, temperature 98.6 F (37 C), temperature source Oral, resp. rate 18, height 5\' 2"  (1.575 m), weight 126.1 kg (278 lb), last menstrual period 05/14/2011, SpO2 100.00%.   General: Awake, in chair, nad  Skin: no rashes  Lungs: cta b  Cor: RRR  Ext - right leg with notable edema to ankle, warmth, minimal erythema       Discharge Orders    Future Appointments: Provider: Department: Dept Phone: Center:   06/27/2012 10:30 AM [facility] [phone] [facility]   07/11/2012 10:00 AM Gardiner Barefoot, MD [facility] [phone] [facility]      Follow-up Information    Follow up with Gates Rigg, MD. Schedule an appointment as soon as possible for a visit in 2 months.   Contact information:   [address] [facility] [place] [address] [phone]       Follow up with Staci Righter, MD. Schedule an  appointment as soon as possible for a visit in 1 week.   Contact information:   [address] [place] [address] [phone]         She needs FEES FIBEROPTIC ENDOSCOPIC EVALUATION of swallowing outpatient     Signed: Richarda Overlie 06/15/2012, 10:08 AM

## 2012-06-21 LAB — HLA B*5701: HLA B 5701: NEGATIVE

## 2012-06-22 ENCOUNTER — Ambulatory Visit (INDEPENDENT_AMBULATORY_CARE_PROVIDER_SITE_OTHER): Payer: Medicare PPO | Admitting: Internal Medicine

## 2012-06-22 ENCOUNTER — Encounter: Payer: Self-pay | Admitting: Internal Medicine

## 2012-06-22 VITALS — BP 152/87 | HR 97 | Temp 98.1°F | Ht 62.0 in | Wt 278.0 lb

## 2012-06-22 DIAGNOSIS — B20 Human immunodeficiency virus [HIV] disease: Secondary | ICD-10-CM

## 2012-06-22 LAB — BASIC METABOLIC PANEL WITH GFR
Calcium: 9.4 mg/dL (ref 8.4–10.5)
GFR, Est African American: >89 mL/min
GFR, Est Non African American: 86 mL/min
Glucose, Bld: 93 mg/dL (ref 70–99)
Potassium: 3.5 mEq/L (ref 3.5–5.3)
Sodium: 140 mEq/L (ref 135–145)

## 2012-06-22 NOTE — Assessment & Plan Note (Addendum)
She had many potential medication causes of her ARF and she had been on tenofovir for quite a while.  I suspect the offending drug was more recent (lisinopril, NSAIDs).  I will recheck her creat today and if it is ok, I will rechallenge her with Stribild, knowing that cobicistat will give a small, pseudo elevation.  If her creat remains elevated, or not significantly improved, I will change her to an abacavir based regimen.   > 30 minutes spent with counseling and coordination of care.

## 2012-06-22 NOTE — Progress Notes (Signed)
  Subjective:    Patient ID: [name], female    DOB: 02/14/1961, 51 y.o.   MRN: [id number]  HPI Here for hospital follow up.  She had been hospitalized for cellulitis and renal insufficiency. She had recently started on lisinopril and was taking a lot of NSAIDS and started on bactrim for cellulitis when renal insufficiency occurred.   Also had a CVA while in the hospital.  She was on Stribild but that was held.  HLA [id number] negative.  She is in a rehab center now with the CVA.  Has had right foot swelling due to gout.     Review of Systems  Constitutional: Negative for fever, chills and fatigue.  Respiratory: Negative for shortness of breath and wheezing.   Cardiovascular: Negative for leg swelling.  Gastrointestinal: Negative for abdominal pain and diarrhea.  Musculoskeletal: Positive for joint swelling. Negative for myalgias and arthralgias.  Skin: Negative for rash.  Hematological: Negative for adenopathy.       Objective:   Physical Exam  Constitutional: She appears well-developed and well-nourished. No distress.       In wheelchair  Cardiovascular: Normal rate, regular rhythm and normal heart sounds.  Exam reveals no gallop and no friction rub.   No murmur heard. Pulmonary/Chest: Effort normal and breath sounds normal. No respiratory distress. She has no wheezes. She has no rales.          Assessment & Plan:

## 2012-06-26 ENCOUNTER — Telehealth: Payer: Self-pay | Admitting: *Deleted

## 2012-06-26 ENCOUNTER — Other Ambulatory Visit: Payer: Self-pay | Admitting: Infectious Disease

## 2012-06-26 NOTE — Telephone Encounter (Signed)
Patient notified

## 2012-06-26 NOTE — Telephone Encounter (Signed)
Message copied by Macy Mis on Mon Jun 26, 2012  8:55 AM ------      Message from: Gardiner Barefoot      Created: Sun Jun 25, 2012 10:22 PM       Her creatinine has normalized.  Have her restart the Stribild.  Thanks

## 2012-06-26 NOTE — Telephone Encounter (Signed)
Called patient to notify that she can restart her Stribild.  Left message for her to return my call.

## 2012-06-27 ENCOUNTER — Other Ambulatory Visit: Payer: Medicare PPO

## 2012-07-04 ENCOUNTER — Observation Stay (HOSPITAL_COMMUNITY)
Admission: EM | Admit: 2012-07-04 | Discharge: 2012-07-06 | Disposition: A | Discharge status: Skilled Nursing Facility | Payer: Medicare PPO | Attending: Internal Medicine | Admitting: Internal Medicine

## 2012-07-04 ENCOUNTER — Observation Stay (HOSPITAL_COMMUNITY): Payer: Medicare PPO

## 2012-07-04 ENCOUNTER — Encounter (HOSPITAL_COMMUNITY): Payer: Self-pay | Admitting: Emergency Medicine

## 2012-07-04 DIAGNOSIS — M79609 Pain in unspecified limb: Secondary | ICD-10-CM

## 2012-07-04 DIAGNOSIS — R0602 Shortness of breath: Secondary | ICD-10-CM

## 2012-07-04 DIAGNOSIS — E039 Hypothyroidism, unspecified: Secondary | ICD-10-CM | POA: Insufficient documentation

## 2012-07-04 DIAGNOSIS — R197 Diarrhea, unspecified: Secondary | ICD-10-CM

## 2012-07-04 DIAGNOSIS — J449 Chronic obstructive pulmonary disease, unspecified: Secondary | ICD-10-CM | POA: Diagnosis present

## 2012-07-04 DIAGNOSIS — I5022 Chronic systolic (congestive) heart failure: Secondary | ICD-10-CM

## 2012-07-04 DIAGNOSIS — J209 Acute bronchitis, unspecified: Secondary | ICD-10-CM

## 2012-07-04 DIAGNOSIS — R7989 Other specified abnormal findings of blood chemistry: Secondary | ICD-10-CM

## 2012-07-04 DIAGNOSIS — M7989 Other specified soft tissue disorders: Secondary | ICD-10-CM

## 2012-07-04 DIAGNOSIS — M1990 Unspecified osteoarthritis, unspecified site: Secondary | ICD-10-CM

## 2012-07-04 DIAGNOSIS — Z21 Asymptomatic human immunodeficiency virus [HIV] infection status: Secondary | ICD-10-CM | POA: Insufficient documentation

## 2012-07-04 DIAGNOSIS — E785 Hyperlipidemia, unspecified: Secondary | ICD-10-CM

## 2012-07-04 DIAGNOSIS — Q211 Atrial septal defect: Secondary | ICD-10-CM

## 2012-07-04 DIAGNOSIS — J4489 Other specified chronic obstructive pulmonary disease: Secondary | ICD-10-CM

## 2012-07-04 DIAGNOSIS — R110 Nausea: Secondary | ICD-10-CM

## 2012-07-04 DIAGNOSIS — F329 Major depressive disorder, single episode, unspecified: Secondary | ICD-10-CM

## 2012-07-04 DIAGNOSIS — Q2112 Patent foramen ovale: Secondary | ICD-10-CM

## 2012-07-04 DIAGNOSIS — L02419 Cutaneous abscess of limb, unspecified: Principal | ICD-10-CM | POA: Insufficient documentation

## 2012-07-04 DIAGNOSIS — M25519 Pain in unspecified shoulder: Secondary | ICD-10-CM | POA: Insufficient documentation

## 2012-07-04 DIAGNOSIS — L03115 Cellulitis of right lower limb: Secondary | ICD-10-CM | POA: Diagnosis present

## 2012-07-04 DIAGNOSIS — M25579 Pain in unspecified ankle and joints of unspecified foot: Secondary | ICD-10-CM | POA: Insufficient documentation

## 2012-07-04 DIAGNOSIS — Z79899 Other long term (current) drug therapy: Secondary | ICD-10-CM | POA: Insufficient documentation

## 2012-07-04 DIAGNOSIS — B009 Herpesviral infection, unspecified: Secondary | ICD-10-CM

## 2012-07-04 DIAGNOSIS — F411 Generalized anxiety disorder: Secondary | ICD-10-CM

## 2012-07-04 DIAGNOSIS — Z9189 Other specified personal risk factors, not elsewhere classified: Secondary | ICD-10-CM

## 2012-07-04 DIAGNOSIS — Z87898 Personal history of other specified conditions: Secondary | ICD-10-CM

## 2012-07-04 DIAGNOSIS — N946 Dysmenorrhea, unspecified: Secondary | ICD-10-CM

## 2012-07-04 DIAGNOSIS — E876 Hypokalemia: Secondary | ICD-10-CM

## 2012-07-04 DIAGNOSIS — Z7902 Long term (current) use of antithrombotics/antiplatelets: Secondary | ICD-10-CM | POA: Insufficient documentation

## 2012-07-04 DIAGNOSIS — Z8673 Personal history of transient ischemic attack (TIA), and cerebral infarction without residual deficits: Secondary | ICD-10-CM | POA: Insufficient documentation

## 2012-07-04 DIAGNOSIS — IMO0002 Reserved for concepts with insufficient information to code with codable children: Secondary | ICD-10-CM

## 2012-07-04 DIAGNOSIS — E669 Obesity, unspecified: Secondary | ICD-10-CM

## 2012-07-04 DIAGNOSIS — I10 Essential (primary) hypertension: Secondary | ICD-10-CM | POA: Diagnosis present

## 2012-07-04 DIAGNOSIS — M79604 Pain in right leg: Secondary | ICD-10-CM | POA: Diagnosis present

## 2012-07-04 DIAGNOSIS — B20 Human immunodeficiency virus [HIV] disease: Secondary | ICD-10-CM | POA: Diagnosis present

## 2012-07-04 DIAGNOSIS — F3289 Other specified depressive episodes: Secondary | ICD-10-CM

## 2012-07-04 DIAGNOSIS — I639 Cerebral infarction, unspecified: Secondary | ICD-10-CM

## 2012-07-04 DIAGNOSIS — I2510 Atherosclerotic heart disease of native coronary artery without angina pectoris: Secondary | ICD-10-CM

## 2012-07-04 DIAGNOSIS — Z9181 History of falling: Secondary | ICD-10-CM | POA: Insufficient documentation

## 2012-07-04 DIAGNOSIS — J9600 Acute respiratory failure, unspecified whether with hypoxia or hypercapnia: Secondary | ICD-10-CM

## 2012-07-04 DIAGNOSIS — R079 Chest pain, unspecified: Secondary | ICD-10-CM | POA: Diagnosis present

## 2012-07-04 DIAGNOSIS — K644 Residual hemorrhoidal skin tags: Secondary | ICD-10-CM

## 2012-07-04 DIAGNOSIS — D649 Anemia, unspecified: Secondary | ICD-10-CM

## 2012-07-04 LAB — CBC WITH DIFFERENTIAL/PLATELET
Basophils Absolute: 0 10*3/uL (ref 0.0–0.1)
Basophils Relative: 0 % (ref 0–1)
Hemoglobin: 9.4 g/dL — ABNORMAL LOW (ref 12.0–15.0)
Lymphocytes Relative: 31 % (ref 12–46)
MCHC: 30.4 g/dL (ref 30.0–36.0)
Monocytes Relative: 11 % (ref 3–12)
Neutro Abs: 3.1 10*3/uL (ref 1.7–7.7)
Neutrophils Relative %: 55 % (ref 43–77)
RDW: 15.6 % — ABNORMAL HIGH (ref 11.5–15.5)
WBC: 5.7 10*3/uL (ref 4.0–10.5)

## 2012-07-04 LAB — BASIC METABOLIC PANEL
CO2: 29 mEq/L (ref 19–32)
Chloride: 98 mEq/L (ref 96–112)
GFR calc Af Amer: >90 mL/min (ref >90)
Potassium: 3.9 mEq/L (ref 3.5–5.1)

## 2012-07-04 LAB — TROPONIN I
Troponin I: <0.3 ng/mL (ref <0.30)
Troponin I: <0.3 ng/mL (ref <0.30)

## 2012-07-04 MED ORDER — ACETAMINOPHEN 325 MG PO TABS: 650.0000 mg | ORAL_TABLET | Freq: Four times a day (QID) | ORAL | Status: DC | PRN

## 2012-07-04 MED ORDER — SODIUM CHLORIDE 0.9 % IJ SOLN: 3.0000 mL | INTRAMUSCULAR | Status: DC | PRN

## 2012-07-04 MED ORDER — FEBUXOSTAT 40 MG PO TABS
80.0000 mg | ORAL_TABLET | Freq: Every day | ORAL | Status: DC
  Administered 2012-07-04 – 2012-07-06 (×3): 80 mg via ORAL
  Filled 2012-07-04 (×3): qty 2

## 2012-07-04 MED ORDER — SODIUM CHLORIDE 0.9 % IV SOLN
INTRAVENOUS | Status: DC
  Administered 2012-07-04: 125 mL/h via INTRAVENOUS

## 2012-07-04 MED ORDER — ONDANSETRON HCL 4 MG PO TABS
4.0000 mg | ORAL_TABLET | Freq: Four times a day (QID) | ORAL | Status: DC | PRN
  Administered 2012-07-06: 4 mg via ORAL
  Filled 2012-07-04: qty 1

## 2012-07-04 MED ORDER — ONDANSETRON HCL 4 MG/2ML IJ SOLN: 4.0000 mg | Freq: Four times a day (QID) | INTRAMUSCULAR | Status: DC | PRN

## 2012-07-04 MED ORDER — ISOSORBIDE MONONITRATE ER 60 MG PO TB24
60.0000 mg | ORAL_TABLET | Freq: Every day | ORAL | Status: DC
  Administered 2012-07-04 – 2012-07-06 (×3): 60 mg via ORAL
  Filled 2012-07-04 (×3): qty 1

## 2012-07-04 MED ORDER — HYDROCODONE-ACETAMINOPHEN 5-325 MG PO TABS
1.0000 | ORAL_TABLET | ORAL | Status: DC | PRN
  Administered 2012-07-04 – 2012-07-06 (×7): 2 via ORAL
  Filled 2012-07-04 (×7): qty 2

## 2012-07-04 MED ORDER — HYDROMORPHONE HCL PF 1 MG/ML IJ SOLN
1.0000 mg | INTRAMUSCULAR | Status: DC | PRN
  Administered 2012-07-04 – 2012-07-06 (×5): 1 mg via INTRAVENOUS
  Filled 2012-07-04 (×6): qty 1

## 2012-07-04 MED ORDER — PANTOPRAZOLE SODIUM 20 MG PO TBEC
20.0000 mg | DELAYED_RELEASE_TABLET | Freq: Every day | ORAL | Status: DC
  Administered 2012-07-04 – 2012-07-06 (×3): 20 mg via ORAL
  Filled 2012-07-04 (×3): qty 1

## 2012-07-04 MED ORDER — NITROGLYCERIN 0.4 MG SL SUBL: 0.4000 mg | SUBLINGUAL_TABLET | SUBLINGUAL | Status: DC | PRN

## 2012-07-04 MED ORDER — ONDANSETRON HCL 4 MG/2ML IJ SOLN
4.0000 mg | Freq: Once | INTRAMUSCULAR | Status: AC
  Administered 2012-07-04: 4 mg via INTRAVENOUS
  Filled 2012-07-04: qty 2

## 2012-07-04 MED ORDER — NON FORMULARY: 5.0000 mg | Freq: Every day | Status: DC

## 2012-07-04 MED ORDER — TRAZODONE HCL 50 MG PO TABS
50.0000 mg | ORAL_TABLET | Freq: Every day | ORAL | Status: DC
  Administered 2012-07-04 – 2012-07-05 (×2): 50 mg via ORAL
  Filled 2012-07-04 (×3): qty 1

## 2012-07-04 MED ORDER — GABAPENTIN 300 MG PO CAPS
300.0000 mg | ORAL_CAPSULE | Freq: Three times a day (TID) | ORAL | Status: DC
  Administered 2012-07-04 – 2012-07-06 (×6): 300 mg via ORAL
  Filled 2012-07-04 (×7): qty 1

## 2012-07-04 MED ORDER — ONDANSETRON HCL 4 MG/2ML IJ SOLN
4.0000 mg | Freq: Three times a day (TID) | INTRAMUSCULAR | Status: DC | PRN
  Administered 2012-07-04: 4 mg via INTRAVENOUS
  Filled 2012-07-04: qty 2

## 2012-07-04 MED ORDER — SODIUM CHLORIDE 0.9 % IV SOLN: 250.0000 mL | INTRAVENOUS | Status: DC | PRN

## 2012-07-04 MED ORDER — VANCOMYCIN HCL 1000 MG IV SOLR
1250.0000 mg | Freq: Two times a day (BID) | INTRAVENOUS | Status: DC
  Administered 2012-07-05: 1250 mg via INTRAVENOUS
  Filled 2012-07-04 (×2): qty 1250

## 2012-07-04 MED ORDER — SODIUM CHLORIDE 0.9 % IJ SOLN: 3.0000 mL | Freq: Two times a day (BID) | INTRAMUSCULAR | Status: DC

## 2012-07-04 MED ORDER — CARVEDILOL 3.125 MG PO TABS
3.1250 mg | ORAL_TABLET | Freq: Two times a day (BID) | ORAL | Status: DC
Start: 2012-07-05 — End: 2012-07-06
  Administered 2012-07-05 – 2012-07-06 (×4): 3.125 mg via ORAL
  Filled 2012-07-04 (×5): qty 1

## 2012-07-04 MED ORDER — CLOPIDOGREL BISULFATE 75 MG PO TABS
75.0000 mg | ORAL_TABLET | Freq: Every day | ORAL | Status: DC
  Administered 2012-07-05 – 2012-07-06 (×2): 75 mg via ORAL
  Filled 2012-07-04 (×2): qty 1

## 2012-07-04 MED ORDER — SODIUM CHLORIDE 0.9 % IJ SOLN
3.0000 mL | Freq: Two times a day (BID) | INTRAMUSCULAR | Status: DC
  Administered 2012-07-05: 3 mL via INTRAVENOUS

## 2012-07-04 MED ORDER — ROSUVASTATIN CALCIUM 5 MG PO TABS
5.0000 mg | ORAL_TABLET | Freq: Every day | ORAL | Status: DC
  Administered 2012-07-05 – 2012-07-06 (×2): 5 mg via ORAL
  Filled 2012-07-04 (×2): qty 1

## 2012-07-04 MED ORDER — ACETAMINOPHEN 650 MG RE SUPP: 650.0000 mg | Freq: Four times a day (QID) | RECTAL | Status: DC | PRN

## 2012-07-04 MED ORDER — ENOXAPARIN SODIUM 40 MG/0.4ML ~~LOC~~ SOLN
40.0000 mg | SUBCUTANEOUS | Status: DC
  Administered 2012-07-04 – 2012-07-05 (×2): 40 mg via SUBCUTANEOUS
  Filled 2012-07-04 (×3): qty 0.4

## 2012-07-04 MED ORDER — ALBUTEROL SULFATE (5 MG/ML) 0.5% IN NEBU: 2.5000 mg | INHALATION_SOLUTION | RESPIRATORY_TRACT | Status: DC | PRN

## 2012-07-04 MED ORDER — VANCOMYCIN HCL 1000 MG IV SOLR
2000.0000 mg | INTRAVENOUS | Status: DC
  Administered 2012-07-04: 2000 mg via INTRAVENOUS
  Filled 2012-07-04: qty 2000

## 2012-07-04 MED ORDER — HYDROMORPHONE HCL PF 1 MG/ML IJ SOLN
1.0000 mg | INTRAMUSCULAR | Status: DC | PRN
  Administered 2012-07-04: 1 mg via INTRAVENOUS
  Filled 2012-07-04: qty 1

## 2012-07-04 MED ORDER — BISACODYL 5 MG PO TBEC: 10.0000 mg | DELAYED_RELEASE_TABLET | Freq: Every day | ORAL | Status: DC | PRN

## 2012-07-04 MED ORDER — ELVITEG-COBIC-EMTRICIT-TENOFDF 150-150-200-300 MG PO TABS
1.0000 | ORAL_TABLET | Freq: Every day | ORAL | Status: DC
  Administered 2012-07-05 – 2012-07-06 (×2): 1 via ORAL
  Filled 2012-07-04 (×3): qty 1

## 2012-07-04 MED ORDER — MORPHINE SULFATE 4 MG/ML IJ SOLN
6.0000 mg | Freq: Once | INTRAMUSCULAR | Status: AC
  Administered 2012-07-04: 6 mg via INTRAVENOUS
  Filled 2012-07-04: qty 2

## 2012-07-04 NOTE — Progress Notes (Signed)
VASCULAR LAB PRELIMINARY  PRELIMINARY  PRELIMINARY  PRELIMINARY  Right lower extremity venous duplex  completed.    Preliminary report:  Right:  No evidence of DVT, superficial thrombosis, or Baker's cyst.   Saje Gallop, RVT 07/04/2012, 4:55 PM

## 2012-07-04 NOTE — ED Notes (Signed)
Patient claims that she has been seen previously for "cellulitis" in her R foot.  Patient claims that it had gone down previously, but has now "starting swelling up into my thigh".
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
## 2012-07-04 NOTE — ED Notes (Signed)
Patient is resting comfortably.  Patient asking if she can eat something.

## 2012-07-04 NOTE — ED Notes (Signed)
Repositioned patient again with multiple pillow under leg to elevate higher.

## 2012-07-04 NOTE — Progress Notes (Addendum)
Received pt from ED,pt Alert and oriented X 3,moex4,edema at RLE,speech is clear,able to follow command,PEARRLA +4+4,Palapaple radial pulses

## 2012-07-04 NOTE — Progress Notes (Signed)
ANTIBIOTIC CONSULT NOTE - INITIAL  Pharmacy Consult for vancomycin Indication: recurrent cellulitis  Allergies  Allergen Reactions  . Atorvastatin Other (See Comments)    Patient states that the medication affects her memory    Patient Measurements: Height: 5\' 2"  (157.5 cm) Weight: 276 lb (125.193 kg) IBW/kg (Calculated) : 50.1   Vital Signs: Temp: 98.1 F (36.7 C) (10/29 1457) Temp src: Oral (10/29 1457) BP: 133/66 mmHg (10/29 1500) Pulse Rate: 92  (10/29 1500) Intake/Output from previous day:   Intake/Output from this shift:    Labs:  [facility] 07/04/12 1307  WBC 5.7  HGB 9.4*  PLT 322  LABCREA --  CREATININE 0.77   Estimated Creatinine Clearance: 105.2 ml/min (by C-G formula based on Cr of 0.77). No results found for this basename: VANCOTROUGH:2,VANCOPEAK:2,VANCORANDOM:2,GENTTROUGH:2,GENTPEAK:2,GENTRANDOM:2,TOBRATROUGH:2,TOBRAPEAK:2,TOBRARND:2,AMIKACINPEAK:2,AMIKACINTROU:2,AMIKACIN:2, in the last 72 hours   Microbiology: Recent Results (from the past 720 hour(s))  CULTURE, BLOOD (ROUTINE X 2)     Status: Normal   Collection Time   06/09/12  1:15 PM      Component Value Range Status Comment   Specimen Description BLOOD ARM RIGHT   Final    Special Requests BOTTLES DRAWN AEROBIC ONLY 10CC   Final    Culture  Setup Time 06/09/2012 21:11   Final    Culture NO GROWTH 5 DAYS   Final    Report Status 06/15/2012 FINAL   Final   CULTURE, BLOOD (ROUTINE X 2)     Status: Normal   Collection Time   06/09/12  1:18 PM      Component Value Range Status Comment   Specimen Description BLOOD HAND RIGHT   Final    Special Requests BOTTLES DRAWN AEROBIC ONLY 10CC   Final    Culture  Setup Time 06/09/2012 21:11   Final    Culture NO GROWTH 5 DAYS   Final    Report Status 06/15/2012 FINAL   Final     Medical History: Past Medical History  Diagnosis Date  . Cholecystitis     Gall bladder removed   . Hypercholesterolemia   . Fibroids   . HIV (human immunodeficiency virus  infection) 05/27/11  . Pelvic pain in female 10/14/11  . PMB (postmenopausal bleeding)     Since 2010  . Increased BMI 05/27/11  . Anxiety   . Depression   . Hypertension   . H/O varicella   . History of measles, mumps, or rubella   . Blood transfusion   . Hypothyroidism   . CHF (congestive heart failure)   . Myocardial infarction 07/2004  . COPD (chronic obstructive pulmonary disease)   . Pneumonia 1980's    "once"  . Iron deficiency anemia   . Lower GI bleed 04/24/2012    recurrent; "last episode 03/2012)  . Stroke 12/2011    residual "speech messes up when I get sick or real tired" (04/24/2012)  . Arthritis     "knees"   Assessment: 25 yof with history of HIV and recurrent cellulitis presents to the ED with leg swelling and foot pain. She was recently admitted on 10/4 for cellulitis where she was started on vancomycin. However, pts renal function worsened so she was switched to daptomycin. Then her CK elevated and ID was consulted. She was discharged on clindamycin per ID recommendations. To start empiric vancomycin for cellulitis. Given history of renal dysfunction while on vanc, will need to watch Scr and UOP closely.   Goal of Therapy:  Vancomycin trough level 10-15 mcg/ml  Plan:  1.  Vancomycin 2gm IV x 1 2. Vancomycin 1250mg  IV Q12H 3. F/u renal fxn, C&S, clinical status and trough at SS 4. May need to consider ID consult given history  Nalany Steedley, Drake Leach 07/04/2012,3:14 PM

## 2012-07-04 NOTE — ED Notes (Signed)
Per EMS - pt was seen and treated for gout and cellulitis in right leg 06/08/12. Pt isn't getting any relief from pain or swelling. Pt rates pain 10/10 in right leg. Pt is coming from [facility]. Pt is A&Ox4.
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
## 2012-07-04 NOTE — ED Notes (Signed)
Care transferred and report given to Marisela, RN 

## 2012-07-05 ENCOUNTER — Observation Stay (HOSPITAL_COMMUNITY): Payer: Medicare PPO

## 2012-07-05 DIAGNOSIS — J9600 Acute respiratory failure, unspecified whether with hypoxia or hypercapnia: Secondary | ICD-10-CM

## 2012-07-05 DIAGNOSIS — L02419 Cutaneous abscess of limb, unspecified: Principal | ICD-10-CM

## 2012-07-05 LAB — BASIC METABOLIC PANEL
Calcium: 9.4 mg/dL (ref 8.4–10.5)
GFR calc Af Amer: >90 mL/min (ref >90)
GFR calc non Af Amer: >90 mL/min (ref >90)
Glucose, Bld: 102 mg/dL — ABNORMAL HIGH (ref 70–99)
Sodium: 135 mEq/L (ref 135–145)

## 2012-07-05 LAB — CBC
MCH: 26.3 pg (ref 26.0–34.0)
MCHC: 30.1 g/dL (ref 30.0–36.0)
Platelets: 278 10*3/uL (ref 150–400)
RBC: 3.12 MIL/uL — ABNORMAL LOW (ref 3.87–5.11)

## 2012-07-05 LAB — TROPONIN I: Troponin I: <0.3 ng/mL (ref <0.30)

## 2012-07-05 MED ORDER — SULFAMETHOXAZOLE-TMP DS 800-160 MG PO TABS
1.0000 | ORAL_TABLET | Freq: Two times a day (BID) | ORAL | Status: DC
  Administered 2012-07-05 – 2012-07-06 (×3): 1 via ORAL
  Filled 2012-07-05 (×4): qty 1

## 2012-07-05 MED ORDER — FUROSEMIDE 10 MG/ML IJ SOLN
40.0000 mg | Freq: Once | INTRAMUSCULAR | Status: AC
  Administered 2012-07-05: 40 mg via INTRAVENOUS
  Filled 2012-07-05: qty 4

## 2012-07-05 NOTE — Progress Notes (Signed)
Addendum  Patient seen and examined, chart and data base reviewed.  I agree with the above assessment and plan.  For full details please see Mrs. Algis Downs PA. Note.  The right lower extremity swelling/pain, no evidence of cellulitis (no fever/leukocytosis/redness).  Followup on tibia/fibula x-rays, PT eval and treat.   Clint Lipps, MD [facility] Pager: [phone] 07/05/2012, 5:36 PM

## 2012-07-05 NOTE — Progress Notes (Signed)
[facility] PROGRESS NOTE  [name] ZOX:[id number] DOB: 1961-07-05 DOA: 07/04/2012 PCP: Quitman Livings, MD  Assessment/Plan  Right lower extremity pain and swelling. ?Ankle Sprain  Xrays - right ankle and foot (10/29) negative for fracture or osteo.  CT scan from 10/4 negative.  Does not appear to be infected (no fever, erythema, or callor)  Exquisitely tender to palpation particularly around the ankle.  Decreased ROM  Discussed with Dr. Shelle Iron of [facility] (Thank you).  Given history of fall 2 nights ago - probable sprain.  Xray Tibia/Fibia, check SED Rate, CRP, Apply Posterior splint, elevate and Ice.  Physical Therapy  Recent cellulitis  On antibiotics per ID (Bactrim DS TID X 7 Days)  Anemia.   Appears to be acute on chronic.  Concerned for possible internal bleeding/hematoma  Patient on plavix (as well as lovenox in house)  Overnight Drop could be dilutional.  Monitor, transfuse if necessary.  HIV: Continue HAART.  Hypothyroid history: Patient not on any medications. Clinically euthyroid.   COPD: Stable   Morbid obesity   Code Status: full Family Communication:  Disposition Plan: Return to SNF when appropriate (possibly 10/31)    HPI/Subjective: [name] is a 51 y.o. female with extensive past medical history including HIV on HAART, obesity, hypertension, hypothyroid, chronic congestive heart failure, COPD, anxiety and depression, morbid obesity, stroke who is hospitalized between 10/4-10/10 at which time she was treated for cellulitis and small left mid frontal parasagittal infarct. She had also developed acute renal failure due to a combination of medications. She indicates that the redness in the right leg has resolved. However her pain and swelling never fully got better. Initially she had intermittent pain in the right leg below the knee but over the last couple of days she has noted constant sharp pain which is made worse by  movement of the leg. This is associated with worsening of the right lower extremity up to the groin. She denies dyspnea. She gives history of low-grade temperatures in the 77F range. She also gives history of fall 2 days ago when she hurt her right leg and left shoulder. No open wounds. She gives history of intermittent fleeting (less than 1 minute) pain under the left breast which is not radiating and no other associated factors. She has not had any of the chest pain today. She was transferred from the skilled nursing facility to the emergency department for further evaluation and management of same.   Objective: Filed Vitals:   07/04/12 2100 07/05/12 0619 07/05/12 1054 07/05/12 1358  BP: 100/67 103/52 133/89 133/83  Pulse: 108 98  100  Temp: 98.9 F (37.2 C) 98.1 F (36.7 C)  97.9 F (36.6 C)  TempSrc: Oral Oral  Oral  Resp: 20 16    Height:      Weight:  125 kg (275 lb 9.2 oz)    SpO2: 98% 98%  100%    Intake/Output Summary (Last 24 hours) at 07/05/12 1558 Last data filed at 07/05/12 1300  Gross per 24 hour  Intake   1850 ml  Output      0 ml  Net   1850 ml   Filed Weights   07/04/12 1244 07/04/12 1839 07/05/12 0619  Weight: 125.193 kg (276 lb) 125.9 kg (277 lb 9 oz) 125 kg (275 lb 9.2 oz)    Exam:   General:  A&O, NAD, Lying in bed  Cardiovascular: RRR, no M/R/G  Respiratory: CTA no W/C/R  Abdomen: Obese, nt, nd, +BS  Right Lower extremity with swelling primarily from mid tibial area distal thru the ankle and the dorsum of her foot.  Her distal right lower extremity is very tender to palpation with 2-3+ swelling and decreased ROM.  Data Reviewed: Basic Metabolic Panel:  Lab 07/05/12 1610 08-01-2012 1307  NA 135 135  K 4.0 3.9  CL 101 98  CO2 27 29  GLUCOSE 102* 98  BUN 10 9  CREATININE 0.77 0.77  CALCIUM 9.4 10.0  MG -- --  PHOS -- --   CBC:  Lab 07/05/12 0530 08-01-12 1307  WBC 5.1 5.7  NEUTROABS -- 3.1  HGB 8.2* 9.4*  HCT 27.2* 30.9*  MCV 87.2  86.3  PLT 278 322   Cardiac Enzymes:  Lab 07/05/12 0530 Aug 01, 2012 2305 08-01-2012 1708  CKTOTAL -- -- --  CKMB -- -- --  CKMBINDEX -- -- --  TROPONINI <0.30 <0.30 <0.30   BNP (last 3 results)  Basename 04/23/12 2207 03/20/12 2011 01/31/12 1237  PROBNP 211.9* 311.5* 178.7*   CBG: No results found for this basename: GLUCAP:5 in the last 168 hours  Recent Results (from the past 240 hour(s))  CULTURE, BLOOD (ROUTINE X 2)     Status: Normal (Preliminary result)   Collection Time   August 01, 2012 11:05 AM      Component Value Range Status Comment   Specimen Description BLOOD LEFT ANTECUBITAL   Final    Special Requests     Final    Value: BOTTLES DRAWN AEROBIC AND ANAEROBIC BLUE 10CC  RED 5CC   Culture  Setup Time 2012-08-01 19:42   Final    Culture     Final    Value:        BLOOD CULTURE RECEIVED NO GROWTH TO DATE CULTURE WILL BE HELD FOR 5 DAYS BEFORE ISSUING A FINAL NEGATIVE REPORT   Report Status PENDING   Incomplete   CULTURE, BLOOD (ROUTINE X 2)     Status: Normal (Preliminary result)   Collection Time   2012/08/01  1:10 PM      Component Value Range Status Comment   Specimen Description BLOOD RIGHT ANTECUBITAL   Final    Special Requests BOTTLES DRAWN AEROBIC AND ANAEROBIC 10CC   Final    Culture  Setup Time 2012/08/01 19:42   Final    Culture     Final    Value:        BLOOD CULTURE RECEIVED NO GROWTH TO DATE CULTURE WILL BE HELD FOR 5 DAYS BEFORE ISSUING A FINAL NEGATIVE REPORT   Report Status PENDING   Incomplete      Studies: Dg Ankle 2 Views Right  01-Aug-2012  *RADIOLOGY REPORT*  Clinical Data: Pain post fall.  RIGHT ANKLE - 2 VIEW  Comparison: 06/09/2012  Findings: Calcaneal spur.  Ankle mortise intact. There has been some subchondral resorption suggesting disuse osteopenia. Ankle mortise intact. Negative for fracture, dislocation, or other acute abnormality.  Normal alignment. No significant degenerative change. Regional soft tissues unremarkable.  IMPRESSION:  No acute  abnormality   Original Report Authenticated By: Osa Craver, M.D.    Dg Shoulder Left  08-01-2012  *RADIOLOGY REPORT*  Clinical Data: Pain post fall.  LEFT SHOULDER - 2+ VIEW  Comparison: 02/14/2012  Findings: Negative for fracture, dislocation, or other acute abnormality.  Normal alignment and mineralization. No significant degenerative change.  Regional soft tissues unremarkable.  IMPRESSION:  Negative   Original Report Authenticated By: Osa Craver, M.D.    Dg Foot  2 Views Right  07/04/2012  *RADIOLOGY REPORT*  Clinical Data: Pain post fall  RIGHT FOOT - 2 VIEW  Comparison: 06/06/2012  Findings: Calcaneal spur at the plantar aponeurosis.  Diffuse soft tissue swelling most marked dorsally.  Negative for fracture, dislocation, or other acute bone injury.  Normal mineralization and alignment.  IMPRESSION:  1.  Negative for fracture or other acute bone injury. 2.  Dorsal soft tissue swelling. 3.  Calcaneal spur   Original Report Authenticated By: Thora Lance III, M.D.     Scheduled Meds:   . carvedilol  3.125 mg Oral BID WC  . clopidogrel  75 mg Oral Daily  . elvitegravir-cobicistat-emtricitabine-tenofovir  1 tablet Oral Q breakfast  . enoxaparin (LOVENOX) injection  40 mg Subcutaneous Q24H  . febuxostat  80 mg Oral Daily  . furosemide  40 mg Intravenous Once  . gabapentin  300 mg Oral TID  . isosorbide mononitrate  60 mg Oral Daily  . pantoprazole  20 mg Oral Daily  . rosuvastatin  5 mg Oral q1800  . sodium chloride  3 mL Intravenous Q12H  . sodium chloride  3 mL Intravenous Q12H  . sulfamethoxazole-trimethoprim  1 tablet Oral BID  . traZODone  50 mg Oral QHS  . DISCONTD: sodium chloride   Intravenous STAT  . DISCONTD: NON FORMULARY 5 mg  5 mg Oral q1800  . DISCONTD: vancomycin  1,250 mg Intravenous Q12H  . DISCONTD: vancomycin  2,000 mg Intravenous To ER   Continuous Infusions:   Principal Problem:  *Leg pain, right Active Problems:  HUMAN  IMMUNODEFICIENCY VIRUS [HIV]  HYPERTENSION  COPD  Cellulitis of right leg  Chest pain    Time spent: 30 min    Stephani Police  [facility] Pager [phone]. If 8PM-8AM, please contact night-coverage at [email or web address], password [facility] 07/05/2012, 3:58 PM  LOS: 1 day

## 2012-07-05 NOTE — Care Management Note (Signed)
    Page 1 of 1   07/06/2012     10:44:59 AM   CARE MANAGEMENT NOTE 07/06/2012  Patient:  [name], [name]   Account Number:  [id number]  Date Initiated:  07/05/2012  Documentation initiated by:  Letha Cape  Subjective/Objective Assessment:   dx old cellulitis, chest pain  admit as observation- from [facility]     Action/Plan:   Anticipated DC Date:  07/06/2012   Anticipated DC Plan:  SKILLED NURSING FACILITY  In-house referral  Clinical Social Worker      DC Planning Services  CM consult      Choice offered to / List presented to:             Status of service:  Completed, signed off Medicare Important Message given?   (If response is "NO", the following Medicare IM given date fields will be blank) Date Medicare IM given:   Date Additional Medicare IM given:    Discharge Disposition:  SKILLED NURSING FACILITY  Per UR Regulation:  Reviewed for med. necessity/level of care/duration of stay  If discussed at Long Length of Stay Meetings, dates discussed:    Comments:  07/05/12 16:52 Letha Cape RN, BSN [phone] patient is from [facility], CSW following.

## 2012-07-05 NOTE — Progress Notes (Signed)
Patient admitted from [facility]- cellulitis- Full assessment and further  d/c planning to follow-  Reece Levy, MSW, LCSWA [phone]

## 2012-07-05 NOTE — Progress Notes (Signed)
Orthopedic Tech Progress Note Patient Details:  [name] December 11, 1960 [id number]  Ortho Devices Type of Ortho Device: Post (short) splint Splint Material: Fiberglass Ortho Device/Splint Location: right leg Ortho Device/Splint Interventions: Application   Granville Whitefield 07/05/2012, 7:27 PM

## 2012-07-06 DIAGNOSIS — F411 Generalized anxiety disorder: Secondary | ICD-10-CM

## 2012-07-06 LAB — CBC
HCT: 29 % — ABNORMAL LOW (ref 36.0–46.0)
Hemoglobin: 8.6 g/dL — ABNORMAL LOW (ref 12.0–15.0)
MCH: 25.6 pg — ABNORMAL LOW (ref 26.0–34.0)
MCHC: 29.7 g/dL — ABNORMAL LOW (ref 30.0–36.0)
RBC: 3.36 MIL/uL — ABNORMAL LOW (ref 3.87–5.11)

## 2012-07-06 LAB — BASIC METABOLIC PANEL
BUN: 11 mg/dL (ref 6–23)
CO2: 30 mEq/L (ref 19–32)
GFR calc non Af Amer: 70 mL/min — ABNORMAL LOW (ref >90)
Glucose, Bld: 101 mg/dL — ABNORMAL HIGH (ref 70–99)
Potassium: 3.8 mEq/L (ref 3.5–5.1)
Sodium: 139 mEq/L (ref 135–145)

## 2012-07-06 LAB — C-REACTIVE PROTEIN: CRP: 2.3 mg/dL — ABNORMAL HIGH (ref <0.60)

## 2012-07-06 MED ORDER — HYDROMORPHONE HCL 2 MG PO TABS: 1.0000 mg | ORAL_TABLET | Freq: Four times a day (QID) | ORAL | Status: DC | PRN

## 2012-07-06 MED ORDER — SULFAMETHOXAZOLE-TMP DS 800-160 MG PO TABS: 1.0000 | ORAL_TABLET | Freq: Two times a day (BID) | ORAL | Status: DC

## 2012-07-06 MED ORDER — HYDROCODONE-ACETAMINOPHEN 5-500 MG PO TABS: 1.0000 | ORAL_TABLET | Freq: Four times a day (QID) | ORAL | Status: DC | PRN

## 2012-07-06 NOTE — Evaluation (Deleted)
Physical Therapy Evaluation Patient Details Name: [name] MRN: [id number] DOB: 1961-05-15 Today's Date: 07/06/2012 Time: 4098-1191 PT Time Calculation (min): 18 min  PT Assessment / Plan / Recommendation Clinical Impression  Admitted with R ankle sprain; Pt. will benefit from acute PT to address bed mobility, trasnfers, and ambulation. At this time pt. is not safe woth mobility and should be tolerating upright activities but is limited due to pain. Pt. was educated on NWB status of RLE and  is able to follow this percaution. Pt. instrcuted to continue exercises for strengtheing.     PT Assessment  Patient needs continued PT services    Follow Up Recommendations  Supervision/Assistance - 24 hour    Does the patient have the potential to tolerate intense rehabilitation   No, Recommend SNF  Barriers to Discharge Decreased caregiver support      Equipment Recommendations  Rolling walker with 5" wheels;3 in 1 bedside comode       Frequency Min 3X/week    Precautions / Restrictions Precautions Precautions: Fall Required Braces or Orthoses: Other Brace/Splint Other Brace/Splint: RLE for possible ankle sprain. No fx. Restrictions Weight Bearing Restrictions: Yes RLE Weight Bearing: Non weight bearing   Pertinent Vitals/Pain Pt. reported pain with movement      Mobility  Transfers Transfers: Squat Pivot Transfers Squat Pivot Transfers: 4: Min assist Details for Transfer Assistance: Performed squat pivot transfer x3. Pt. able to push up from bed and pivot to chair while managing RLE. Requires modA for safety, set up of chair,  and some cueing for sequencing. Ambulation/Gait Ambulation/Gait Assistance: Not tested (comment) (Too painful to stand or walk due to arthritis is LLE)       Exercises General Exercises - Upper Extremity Chair Push Up: AROM;Strengthening;Both;5 reps;Seated General Exercises - Lower Extremity Long Arc Quad: AROM;Both;10 reps;Seated Hip  Flexion/Marching: AROM;15 reps;Strengthening;Seated;Both   PT Diagnosis: Difficulty walking;Acute pain  PT Problem List: Decreased strength;Decreased activity tolerance;Decreased mobility PT Treatment Interventions: DME instruction;Gait training;Functional mobility training;Therapeutic activities;Therapeutic exercise;Patient/family education   PT Goals Acute Rehab PT Goals PT Goal Formulation: With patient Time For Goal Achievement: 07/13/12 Potential to Achieve Goals: Good Pt will go Sit to Stand: with supervision PT Goal: Sit to Stand - Progress: Goal set today Pt will go Stand to Sit: with supervision PT Goal: Stand to Sit - Progress: Goal set today Pt will Transfer Bed to Chair/Chair to Bed: with supervision PT Transfer Goal: Bed to Chair/Chair to Bed - Progress: Goal set today Pt will Ambulate: with min assist;1 - 15 feet;with rolling walker PT Goal: Ambulate - Progress: Goal set today Pt will Perform Home Exercise Program: Independently PT Goal: Perform Home Exercise Program - Progress: Goal set today  Visit Information  Last PT Received On: 07/06/12 Assistance Needed: +2 (If performing more than bed mobility and transfers)    Subjective Data  Subjective: I need to use the bathroom Patient Stated Goal: Move around without pain in R foot   Prior Functioning  Home Living Available Help at Discharge: Skilled Nursing Facility Type of Home: Skilled Nursing Facility Home Adaptive Equipment: Straight cane Prior Function Level of Independence: Independent with assistive device(s) Bath: Supervision/set-up Meal Prep: Supervision/set-up Communication Communication: No difficulties    Cognition  Overall Cognitive Status: Appears within functional limits for tasks assessed/performed Arousal/Alertness: Awake/alert Orientation Level: Appears intact for tasks assessed Behavior During Session: [facility] for tasks performed          End of Session PT - End of Session Equipment  Utilized During Treatment: Gait belt Activity Tolerance: Patient limited by pain Patient left: in chair;with call bell/phone within reach;with chair alarm set Nurse Communication: Mobility status   Army Chaco SPT 07/06/2012, 11:23 AM

## 2012-07-06 NOTE — Progress Notes (Signed)
Awaiting word from SNF that Insurance Berkley Harvey has been rec'd and at that point will be able to d/c patient back to [facility]- Reece Levy, MSW, Theresia Majors [phone]

## 2012-07-06 NOTE — Clinical Social Work Psychosocial (Signed)
     Clinical Social Work Department BRIEF PSYCHOSOCIAL ASSESSMENT 07/06/2012  Patient:  [name], [name]     Account Number:  [id number]     Admit date:  07/04/2012  Clinical Social Worker:  Robin Searing  Date/Time:  07/06/2012 02:37 PM  Referred by:  Physician  Date Referred:  07/05/2012 Referred for  SNF Placement   Other Referral:   Interview type:  Patient Other interview type:    PSYCHOSOCIAL DATA Living Status:  FACILITY Admitted from facility:  [facility] Level of care:  Skilled Nursing Facility Primary support name:   Primary support relationship to patient:  FAMILY Degree of support available:   GOOD    CURRENT CONCERNS Current Concerns  Post-Acute Placement   Other Concerns:    SOCIAL WORK ASSESSMENT / PLAN Patient plans for return to SNF at d.c- confirmed this with SNF as well.   Assessment/plan status:  Other - See comment Other assessment/ plan:   Await insurance auth for SNF return   Information/referral to community resources:   SNF    PATIENTS/FAMILYS RESPONSE TO PLAN OF CARE: Patient appreciative and agreeable to plans

## 2012-07-06 NOTE — Plan of Care (Signed)
Problem: Phase III Progression Outcomes Goal: Wound care performed by pt/family Outcome: Not Applicable Date Met:  07/06/12 Wound care to be performed at [facility] center

## 2012-07-06 NOTE — Discharge Summary (Signed)
Physician Discharge Summary  [name] JXB:[id number] DOB: October 12, 1960 DOA: 07/04/2012  PCP: Quitman Livings, MD  Admit date: 07/04/2012 Discharge date: 07/06/2012  Time spent: 40  minutes  Recommendations for Outpatient Follow-up:  Non weight bearing on right foot for two weeks.  Then weight bearing as tolerated and indicated by physical therapy at [facility].  If [name]'s right leg pain and swelling do not improve significantly with non-weight bearing status, please seek orthopedic consultation and obtain an MRI to rule out soft tissue injuries / tendon tear.  See primary care physician or Infectious Disease Physician in 1 week to follow up on anemia, HIV management, and complete resolution of cellulitis.  Discharge Diagnoses:  Principal Problem:  *Leg pain, right Active Problems:  HUMAN IMMUNODEFICIENCY VIRUS [HIV]  HYPERTENSION  COPD  Cellulitis of right leg  Chest pain   Discharge Condition: Stable.  Unable to bear weight on right foot.  Diet recommendation: Heart healthy.  Filed Weights   07/04/12 1839 07/05/12 0619 07/06/12 0449  Weight: 125.9 kg (277 lb 9 oz) 125 kg (275 lb 9.2 oz) 126 kg (277 lb 12.5 oz)    History of present illness:  [name] is a 51 y.o. female with extensive past medical history including HIV on HAART, obesity, hypertension, hypothyroid, chronic congestive heart failure, COPD, anxiety and depression, morbid obesity, stroke who is hospitalized between 10/4-10/10 at which time she was treated for cellulitis and small left mid frontal parasagittal infarct. She had also developed acute renal failure due to a combination of medications. She indicates that the redness in the right leg has resolved. However her pain and swelling never fully got better. Initially she had intermittent pain in the right leg below the knee but over the last couple of days she has noted constant sharp pain which is made worse by movement of the leg. This  is associated with worsening of the right lower extremity up to the groin. She denies dyspnea. She gives history of low-grade temperatures in the 20F range. She also gives history of fall 2 days ago when she hurt her right leg and left shoulder. No open wounds. She gives history of intermittent fleeting (less than 1 minute) pain under the left breast which is not radiating and no other associated factors. She has not had any of the chest pain today. She was transferred from the skilled nursing facility to the emergency department for further evaluation and management of same.  Hospital Course:   Right lower extremity pain and swelling. ?Ankle Sprain  The patient indicated that her swelling had never resolved from the cellulitis 3 weeks prior.  However the fever and erythema had resolved.  Two days prior to admission the patient had visited her home on crutches and sustained a mechanical fall twisting her leg and landing on her shoulder.  At that time her pain increased, became sharp, and her swelling became significantly worse. In the hospital her right lower extremity xrays (Tibia/fibia, ankle, foot) revealed only swelling, no fracture.  On exam her distal right lower extremity (starting approximately 8 inches below the knee) was swollen and very tender to palpation.  The edema was significant particularly around her ankle.  As the patient was unable to bear weight at all on the right leg, we discussed her case with Dr. Shelle Iron of Orthopedics.  Given the history, he felt it was very possible her pain and swelling were related to her fall and that she was suffering with a sprain.  He advised applying a posterior splint, elevation, ice, pain management.  We recommend that the patient be nonweight bearing on the right for 10 - 14 days and then resume weight bearing status at the direction of physical therapy at [facility].  If her leg does not significantly improve, please seek orthopedic consultation  and obtain and MRI.  Recent cellulitis Infectious Disease recommended Bactrim DS TID if her cellulitis was still active.  We believe her cellulitis has resolved and consequently will reduce the dose to BID for 5 more days.  She will be seen by ID in follow up.  Anemia.  [name] Hgb was 12.6 in April and July of 2013.  It appears to have slowly trended down to 8.2 - 8.6 over the last 3 months.  MCV value is 86.3 (normocytic).  She does not have active bleeding or frank blood in her stools.  Hence her anemia is stable for outpatient follow up with her PCP.  Pain Medications Ms. Loose was on both vicodin and dilaudid PO prior to admission.  We recommend the dilaudid be discontinued.  To that end we have decreased the dose by 50% and recommend eliminating it completely over the next several days.  HIV: Continue HAART.   Hypothyroid history: Patient not on any medications. Clinically euthyroid.   COPD: Stable   Morbid obesity   Consultations:  Telephone conversations with Infectious disease regarding antibiotic, and with orthopedics regarding care of right leg.  However, these specialists did not see the patient in person during this hospitalization.  Discharge Exam: Filed Vitals:   07/05/12 1358 07/05/12 2121 07/06/12 0449 07/06/12 0816  BP: 133/83 128/79 111/71 116/69  Pulse: 100 101 92 93  Temp: 97.9 F (36.6 C) 98.2 F (36.8 C) 98.3 F (36.8 C)   TempSrc: Oral Oral Oral   Resp:  16 20   Height:      Weight:   126 kg (277 lb 12.5 oz)   SpO2: 100% 99% 95%     General: A&O, NAD, well appearing. ENT:  Left eye permanently diverts slightly left, neck supple Cardiovascular: RRR, No M/R/G Respiratory: CTA, No W/C/R Abdomen:  Obese, NT,  ND, +BS, no appreciable organomegaly. Lower extremities:  RLE currently splinted and wrapped in ace bandage.  Toes warm to touch, + Capillary refill.  Discharge Instructions      Discharge Orders    Future Appointments: Provider:  Department: Dept Phone: Center:   07/13/2012 3:00 PM Gardiner Barefoot, MD [facility] [phone] [facility]   07/18/2012 11:30 AM [facility] [phone] [facility]   08/01/2012 10:45 AM Gardiner Barefoot, MD [facility] [phone] [facility]     Future Orders Please Complete By Expires   Diet - low sodium heart healthy      Increase activity slowly      Comments:   Non weight bearing on RLE x 2 weeks.   Discharge instructions      Comments:   Right Leg is non weight bearing X 2 weeks.  Probable Sprain.  Elevate, ice.       Medication List     As of 07/06/2012 11:42 AM    TAKE these medications         albuterol 108 (90 BASE) MCG/ACT inhaler   Commonly known as: PROVENTIL HFA;VENTOLIN HFA   Inhale 2 puffs into the lungs every 6 (six) hours as needed. For wheezing and shortness of breath  albuterol (5 MG/ML) 0.5% nebulizer solution   Commonly known as: PROVENTIL   Take 2.5 mg by nebulization every 2 (two) hours as needed. For shortness of breath      bisacodyl 5 MG EC tablet   Commonly known as: DULCOLAX   Take 10 mg by mouth daily as needed. For constipation      carvedilol 3.125 MG tablet   Commonly known as: COREG   Take 3.125 mg by mouth 2 (two) times daily with a meal.      clopidogrel 75 MG tablet   Commonly known as: PLAVIX   Take 75 mg by mouth daily.      diphenoxylate-atropine 2.5-0.025 MG per tablet   Commonly known as: LOMOTIL   Take 1 tablet by mouth 4 (four) times daily as needed. For loose stool      elvitegravir-cobicistat-emtricitabine-tenofovir 150-150-200-300 MG Tabs   Commonly known as: STRIBILD   Take 1 tablet by mouth daily with breakfast.      febuxostat 40 MG tablet   Commonly known as: ULORIC   Take 80 mg by mouth daily.      gabapentin 300 MG capsule   Commonly known as: NEURONTIN   Take 300 mg by mouth 3 (three) times daily.      HYDROcodone-acetaminophen 5-500 MG per tablet   Commonly known as: VICODIN    Take 1-2 tablets by mouth every 6 (six) hours as needed. For pain      HYDROmorphone 2 MG tablet   Commonly known as: DILAUDID   Take 0.5 tablets (1 mg total) by mouth every 6 (six) hours as needed. For pain      isosorbide mononitrate 60 MG 24 hr tablet   Commonly known as: IMDUR   Take 60 mg by mouth daily.      nitroGLYCERIN 0.3 MG SL tablet   Commonly known as: NITROSTAT   Place 0.3 mg under the tongue every 5 (five) minutes as needed. For chest pain      pantoprazole 20 MG tablet   Commonly known as: PROTONIX   Take 20 mg by mouth daily.      rosuvastatin 5 MG tablet   Commonly known as: CRESTOR   Take 5 mg by mouth daily at 6 PM.      sulfamethoxazole-trimethoprim 800-160 MG per tablet   Commonly known as: BACTRIM DS   Take 1 tablet by mouth 2 (two) times daily.      traZODone 50 MG tablet   Commonly known as: DESYREL   Take 50 mg by mouth at bedtime.      valACYclovir 1000 MG tablet   Commonly known as: VALTREX   Take 1 tablet by mouth Once daily as needed. For flare ups         Follow-up Information    Follow up with [facility], MD. Schedule an appointment as soon as possible for a visit in 1 week.   Contact information:   [address] [place] [address] [phone]           The results of significant diagnostics from this hospitalization (including imaging, microbiology, ancillary and laboratory) are listed below for reference.    Significant Diagnostic Studies: Dg Tibia/fibula Right  07/05/2012  *RADIOLOGY REPORT*  Clinical Data: Increase swelling and pain right lower extremity.  RIGHT TIBIA AND FIBULA - 2 VIEW  Comparison: 07/04/2012  Findings: Right tibia and fibula appear intact.  No malalignment or displaced fracture.  Degenerative changes of the ankle joint noted. Mild  diffuse soft tissue swelling.  Patchy osteopenia noted at the ankle.  IMPRESSION: No acute osseous abnormality.  Right ankle degenerative changes and periarticular  osteopenia.   Original Report Authenticated By: Judie Petit. Ruel Favors, M.D.    Dg Ankle 2 Views Right  07/04/2012  *RADIOLOGY REPORT*  Clinical Data: Pain post fall.  RIGHT ANKLE - 2 VIEW  Comparison: 06/09/2012  Findings: Calcaneal spur.  Ankle mortise intact. There has been some subchondral resorption suggesting disuse osteopenia. Ankle mortise intact. Negative for fracture, dislocation, or other acute abnormality.  Normal alignment. No significant degenerative change. Regional soft tissues unremarkable.  IMPRESSION:  No acute abnormality   Original Report Authenticated By: Osa Craver, M.D.    Dg Shoulder Left  07/04/2012  *RADIOLOGY REPORT*  Clinical Data: Pain post fall.  LEFT SHOULDER - 2+ VIEW  Comparison: 02/14/2012  Findings: Negative for fracture, dislocation, or other acute abnormality.  Normal alignment and mineralization. No significant degenerative change.  Regional soft tissues unremarkable.  IMPRESSION:  Negative   Original Report Authenticated By: Osa Craver, M.D.    Dg Foot 2 Views Right  07/04/2012  *RADIOLOGY REPORT*  Clinical Data: Pain post fall  RIGHT FOOT - 2 VIEW  Comparison: 06/06/2012  Findings: Calcaneal spur at the plantar aponeurosis.  Diffuse soft tissue swelling most marked dorsally.  Negative for fracture, dislocation, or other acute bone injury.  Normal mineralization and alignment.  IMPRESSION:  1.  Negative for fracture or other acute bone injury. 2.  Dorsal soft tissue swelling. 3.  Calcaneal spur   Original Report Authenticated By: Osa Craver, M.D.     Microbiology: Recent Results (from the past 240 hour(s))  CULTURE, BLOOD (ROUTINE X 2)     Status: Normal (Preliminary result)   Collection Time   07/04/12 11:05 AM      Component Value Range Status Comment   Specimen Description BLOOD LEFT ANTECUBITAL   Final    Special Requests     Final    Value: BOTTLES DRAWN AEROBIC AND ANAEROBIC BLUE 10CC  RED 5CC   Culture  Setup Time  07/04/2012 19:42   Final    Culture     Final    Value:        BLOOD CULTURE RECEIVED NO GROWTH TO DATE CULTURE WILL BE HELD FOR 5 DAYS BEFORE ISSUING A FINAL NEGATIVE REPORT   Report Status PENDING   Incomplete   CULTURE, BLOOD (ROUTINE X 2)     Status: Normal (Preliminary result)   Collection Time   07/04/12  1:10 PM      Component Value Range Status Comment   Specimen Description BLOOD RIGHT ANTECUBITAL   Final    Special Requests BOTTLES DRAWN AEROBIC AND ANAEROBIC 10CC   Final    Culture  Setup Time 07/04/2012 19:42   Final    Culture     Final    Value:        BLOOD CULTURE RECEIVED NO GROWTH TO DATE CULTURE WILL BE HELD FOR 5 DAYS BEFORE ISSUING A FINAL NEGATIVE REPORT   Report Status PENDING   Incomplete      Labs: Basic Metabolic Panel:  Lab 07/06/12 1610 07/05/12 0530 07/04/12 1307  NA 139 135 135  K 3.8 4.0 3.9  CL 101 101 98  CO2 30 27 29   GLUCOSE 101* 102* 98  BUN 11 10 9   CREATININE 0.93 0.77 0.77  CALCIUM 9.6 9.4 10.0  MG -- -- --  PHOS -- -- --  CBC:  Lab 07/06/12 0500 07/05/12 0530 07/04/12 1307  WBC 5.3 5.1 5.7  NEUTROABS -- -- 3.1  HGB 8.6* 8.2* 9.4*  HCT 29.0* 27.2* 30.9*  MCV 86.3 87.2 86.3  PLT 283 278 322   Cardiac Enzymes:  Lab 07/05/12 0530 07/04/12 2305 07/04/12 1708  CKTOTAL -- -- --  CKMB -- -- --  CKMBINDEX -- -- --  TROPONINI <0.30 <0.30 <0.30   BNP: BNP (last 3 results)  Basename 04/23/12 2207 03/20/12 2011 01/31/12 1237  PROBNP 211.9* 311.5* 178.7*    Signed:  Conley Canal [facility] [phone] 07/06/2012, 11:42 AM

## 2012-07-06 NOTE — Evaluation (Signed)
Physical Therapy Evaluation Patient Details Name: [name] MRN: [id number] DOB: Apr 01, 1961 Today's Date: 07/06/2012 Time: 4098-1191 PT Time Calculation (min): 18 min  PT Assessment / Plan / Recommendation Clinical Impression  Admitted with R ankle sprain; Pt. will benefit from acute PT to address bed, mobility, trasnfers, and possible ambulation. At this time pt. is not safe woth mobility and should be tolerating upright activities but is limited due to pain. Pt. was educated on NWB status od RLE and  is able to follow this percaution. Pt. instrcuted to continue exercises for strengtheing.     PT Assessment  Patient needs continued PT services    Follow Up Recommendations  Supervision/Assistance - 24 hour    Does the patient have the potential to tolerate intense rehabilitation   No, Recommend SNF  Barriers to Discharge Decreased caregiver support      Equipment Recommendations  Rolling walker with 5" wheels;3 in 1 bedside comode    Recommendations for Other Services     Frequency Min 3X/week    Precautions / Restrictions Precautions Precautions: Fall Required Braces or Orthoses: Other Brace/Splint Other Brace/Splint: RLE for possible ankle sprain. No fx. Restrictions Weight Bearing Restrictions: Yes RLE Weight Bearing: Non weight bearing   Pertinent Vitals/Pain Pain with RLE with movement unrated      Mobility  Transfers Transfers: Squat Pivot Transfers Squat Pivot Transfers: 4: Min assist Details for Transfer Assistance: Performed squat pivot transfer x3. Pt. able to push up from bed and pivot to chair while managing RLE. Requires minA for safety, set up of chair,  and some cueing for sequencing. Ambulation/Gait Ambulation/Gait Assistance: Not tested (comment) (Too painful to stand or walk due to arthritis is LLE)    Shoulder Instructions     Exercises General Exercises - Upper Extremity Chair Push Up: AROM;Strengthening;Both;5 reps;Seated General  Exercises - Lower Extremity Long Arc Quad: AROM;Both;10 reps;Seated Hip Flexion/Marching: AROM;15 reps;Strengthening;Seated;Both   PT Diagnosis: Difficulty walking;Acute pain  PT Problem List: Decreased strength;Decreased activity tolerance;Decreased mobility PT Treatment Interventions: DME instruction;Gait training;Functional mobility training;Therapeutic activities;Therapeutic exercise;Patient/family education   PT Goals Acute Rehab PT Goals PT Goal Formulation: With patient Time For Goal Achievement: 07/13/12 Potential to Achieve Goals: Good Pt will go Sit to Stand: with supervision PT Goal: Sit to Stand - Progress: Goal set today Pt will go Stand to Sit: with supervision PT Goal: Stand to Sit - Progress: Goal set today Pt will Transfer Bed to Chair/Chair to Bed: with supervision PT Transfer Goal: Bed to Chair/Chair to Bed - Progress: Goal set today Pt will Ambulate: with min assist;1 - 15 feet;with rolling walker PT Goal: Ambulate - Progress: Goal set today Pt will Perform Home Exercise Program: Independently PT Goal: Perform Home Exercise Program - Progress: Goal set today  Visit Information  Last PT Received On: 07/06/12 Assistance Needed: +2 (If performing more than bed mobility and transfers)    Subjective Data  Subjective: I need to use the bathroom Patient Stated Goal: Move around without pain in R foot   Prior Functioning  Home Living Available Help at Discharge: Skilled Nursing Facility Type of Home: Skilled Nursing Facility Home Adaptive Equipment: Straight cane Prior Function Level of Independence: Independent with assistive device(s) Bath: Supervision/set-up Meal Prep: Supervision/set-up Communication Communication: No difficulties    Cognition  Overall Cognitive Status: Appears within functional limits for tasks assessed/performed Arousal/Alertness: Awake/alert Orientation Level: Appears intact for tasks assessed Behavior During Session: [facility] for tasks  performed    Extremity/Trunk Assessment  Balance    End of Session PT - End of Session Equipment Utilized During Treatment: Gait belt Activity Tolerance: Patient limited by pain Patient left: in chair;with call bell/phone within reach;with chair alarm set Nurse Communication: Mobility status  GP Functional Assessment Tool Used: clinical judgement Functional Limitation: Mobility: Walking and moving around Mobility: Walking and Moving Around Current Status (W1191): At least 20 percent but less than 40 percent impaired, limited or restricted Mobility: Walking and Moving Around Goal Status [id number]): At least 1 percent but less than 20 percent impaired, limited or restricted   Army [place]aco, SPT Seen and agree with above note Delaney Meigs, PT [phone]

## 2012-07-06 NOTE — ED Provider Notes (Signed)
Medical screening examination/treatment/procedure(s) were performed by non-physician practitioner and as supervising physician I was immediately available for consultation/collaboration.   Gwyneth Sprout, MD 07/06/12 2052

## 2012-07-06 NOTE — Progress Notes (Signed)
Lo[facility] Chance to be D/C'd Skilled nursing facility per MD order.  Discussed with the patient and all questions fully answered.   Angla, Delahunt  Home Medication Instructions [id number]   Printed on:07/06/12 1822  Medication Information                    albuterol (PROVENTIL HFA;VENTOLIN HFA) 108 (90 BASE) MCG/ACT inhaler Inhale 2 puffs into the lungs every 6 (six) hours as needed. For wheezing and shortness of breath           nitroGLYCERIN (NITROSTAT) 0.3 MG SL tablet Place 0.3 mg under the tongue every 5 (five) minutes as needed. For chest pain           isosorbide mononitrate (IMDUR) 60 MG 24 hr tablet Take 60 mg by mouth daily.           rosuvastatin (CRESTOR) 5 MG tablet Take 5 mg by mouth daily at 6 PM.           carvedilol (COREG) 3.125 MG tablet Take 3.125 mg by mouth 2 (two) times daily with a meal.           diphenoxylate-atropine (LOMOTIL) 2.5-0.025 MG per tablet Take 1 tablet by mouth 4 (four) times daily as needed. For loose stool           pantoprazole (PROTONIX) 20 MG tablet Take 20 mg by mouth daily.            albuterol (PROVENTIL) (5 MG/ML) 0.5% nebulizer solution Take 2.5 mg by nebulization every 2 (two) hours as needed. For shortness of breath           valACYclovir (VALTREX) 1000 MG tablet Take 1 tablet by mouth Once daily as needed. For flare ups           febuxostat (ULORIC) 40 MG tablet Take 80 mg by mouth daily.           traZODone (DESYREL) 50 MG tablet Take 50 mg by mouth at bedtime.           elvitegravir-cobicistat-emtricitabine-tenofovir (STRIBILD) 150-150-200-300 MG TABS Take 1 tablet by mouth daily with breakfast.           clopidogrel (PLAVIX) 75 MG tablet Take 75 mg by mouth daily.           bisacodyl (DULCOLAX) 5 MG EC tablet Take 10 mg by mouth daily as needed. For constipation           gabapentin (NEURONTIN) 300 MG capsule Take 300 mg by mouth 3 (three) times daily.           HYDROcodone-acetaminophen (VICODIN) 5-500  MG per tablet Take 1-2 tablets by mouth every 6 (six) hours as needed. For pain           HYDROmorphone (DILAUDID) 2 MG tablet Take 0.5 tablets (1 mg total) by mouth every 6 (six) hours as needed. For pain           sulfamethoxazole-trimethoprim (BACTRIM DS) 800-160 MG per tablet Take 1 tablet by mouth 2 (two) times daily.             VVS, Skin clean, dry and intact without evidence of skin break down, no evidence of skin tears noted. IV catheter discontinued intact. Site without signs and symptoms of complications. Dressing and pressure applied.  An After Visit Summary was printed and given to the patient. Follow up appointments , new prescriptions and medication administration times given. [employer] center and  gave nurse report. Retrieved home meds from pharmacy and gave back to pt. Pt transported by EMS to SNF   Cindra Eves, RN 07/06/2012 6:22 PM

## 2012-07-06 NOTE — Discharge Summary (Signed)
Addendum  Patient seen and examined, chart and data base reviewed.  I agree with the above assessment and discharge plan.  For full details please see Mrs. Algis Downs PA. Note.  RLE swelling and pain, likley secondary to sprain form recent fall. No evidence of cellulitis.   Clint Lipps, MD [facility] Pager: [phone] 07/06/2012, 12:27 PM

## 2012-07-10 LAB — CULTURE, BLOOD (ROUTINE X 2): Culture: NO GROWTH

## 2012-07-11 ENCOUNTER — Ambulatory Visit: Payer: Medicare PPO | Admitting: Internal Medicine

## 2012-07-13 ENCOUNTER — Ambulatory Visit (INDEPENDENT_AMBULATORY_CARE_PROVIDER_SITE_OTHER): Payer: Medicare PPO | Admitting: Internal Medicine

## 2012-07-13 ENCOUNTER — Encounter: Payer: Self-pay | Admitting: Internal Medicine

## 2012-07-13 VITALS — BP 123/80 | HR 92 | Temp 98.7°F | Ht 62.0 in | Wt 273.0 lb

## 2012-07-13 DIAGNOSIS — M79604 Pain in right leg: Secondary | ICD-10-CM

## 2012-07-13 DIAGNOSIS — B20 Human immunodeficiency virus [HIV] disease: Secondary | ICD-10-CM

## 2012-07-13 DIAGNOSIS — M79609 Pain in unspecified limb: Secondary | ICD-10-CM

## 2012-07-13 NOTE — Progress Notes (Signed)
  Subjective:    Patient ID: [name], female    DOB: 1961-02-14, 51 y.o.   MRN: [id number]  HPI She comes in for hospital followup. She was again seen in emergency room for continued right foot pain and swelling in there was concern for cellulitis so she was admitted. It was noted though that her foot pain followed a twisting of her ankle. There is no warmth or erythema. After her last visit here, she was started again on Stribild with a normal creatinine. She's been on this for about 3 weeks.   Review of Systems  Constitutional: Negative for fever, chills and fatigue.  HENT: Negative for sore throat and trouble swallowing.   Respiratory: Negative for cough and shortness of breath.   Gastrointestinal: Negative for nausea, abdominal pain and diarrhea.  Musculoskeletal: Positive for joint swelling and arthralgias. Negative for myalgias.  Neurological: Negative for dizziness and headaches.       Objective:   Physical Exam  Constitutional: She appears well-developed and well-nourished. No distress.  Cardiovascular: Normal rate, regular rhythm and normal heart sounds.  Exam reveals no gallop and no friction rub.   No murmur heard. Pulmonary/Chest: Effort normal and breath sounds normal. No respiratory distress. She has no wheezes. She has no rales.  Abdominal: Soft. Bowel sounds are normal. She exhibits no distension. There is no tenderness. There is no rebound.  Musculoskeletal:       Right leg wrap          Assessment & Plan:

## 2012-07-13 NOTE — Assessment & Plan Note (Signed)
Her right leg edema seems to be slowly resolving. She continues to have pain but previous CAT scan did not suggest any osteomyelitis. She is continuing to be nonweightbearing. She will unwrap it and continue weightbearing next week though if it is continuing to hurt she will call orthopedics for a consultation

## 2012-07-13 NOTE — Patient Instructions (Signed)
Call for an orthopedic if you're right leg does not continue to improve

## 2012-07-13 NOTE — Assessment & Plan Note (Signed)
She is taking her Stribild well and I will check her followup labs today. She will return in 3 months but will be seen sooner if there any concerns

## 2012-07-14 ENCOUNTER — Emergency Department (INDEPENDENT_AMBULATORY_CARE_PROVIDER_SITE_OTHER)
Admission: EM | Admit: 2012-07-14 | Discharge: 2012-07-14 | Disposition: A | Discharge status: Home / Self Care | Payer: Medicare PPO | Source: Home / Self Care

## 2012-07-14 ENCOUNTER — Encounter (HOSPITAL_COMMUNITY): Payer: Self-pay | Admitting: Emergency Medicine

## 2012-07-14 DIAGNOSIS — R609 Edema, unspecified: Secondary | ICD-10-CM

## 2012-07-14 DIAGNOSIS — M79609 Pain in unspecified limb: Secondary | ICD-10-CM

## 2012-07-14 DIAGNOSIS — M79604 Pain in right leg: Secondary | ICD-10-CM

## 2012-07-14 DIAGNOSIS — R600 Localized edema: Secondary | ICD-10-CM

## 2012-07-14 LAB — COMPLETE METABOLIC PANEL WITH GFR
ALT: 8 U/L (ref 0–35)
AST: 12 U/L (ref 0–37)
Albumin: 3.8 g/dL (ref 3.5–5.2)
Alkaline Phosphatase: 47 U/L (ref 39–117)
BUN: 12 mg/dL (ref 6–23)
Calcium: 9.2 mg/dL (ref 8.4–10.5)
Chloride: 104 mEq/L (ref 96–112)
Potassium: 4.2 mEq/L (ref 3.5–5.3)
Sodium: 139 mEq/L (ref 135–145)
Total Protein: 7.1 g/dL (ref 6.0–8.3)

## 2012-07-14 MED ORDER — HYDROMORPHONE HCL 2 MG PO TABS: 2.0000 mg | ORAL_TABLET | ORAL | Status: DC | PRN

## 2012-07-14 NOTE — Progress Notes (Signed)
Orthopedic Tech Progress Note Patient Details:  [name] 10-11-1960 [id number]  Ortho Devices Type of Ortho Device: Radio broadcast assistant Ortho Device/Splint Location: (R) LE Ortho Device/Splint Interventions: Application   Jennye Moccasin 07/14/2012, 6:50 PM

## 2012-07-14 NOTE — ED Notes (Signed)
Ortho Tech paged.
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
## 2012-07-14 NOTE — ED Provider Notes (Signed)
Medical screening examination/treatment/procedure(s) were performed by non-physician practitioner and as supervising physician I was immediately available for consultation/collaboration.  Leslee Home, M.D.   Reuben Likes, MD 07/14/12 2200

## 2012-07-14 NOTE — ED Notes (Signed)
Pt was seen on 06/08/12 for gout and on 07/04/12 for cellulitis... Had a splint placed on her right foot and the wrapping is coming off... Would like to splint re-wrapped... Sx today include: throbbing pain, warm, tender... Denies: fever, vomiting, diarrhea... Pt is alert w/no signs of distress.

## 2012-07-16 LAB — HIV-1 RNA QUANT-NO REFLEX-BLD: HIV 1 RNA Quant: <20 copies/mL (ref <20)

## 2012-07-18 ENCOUNTER — Other Ambulatory Visit: Payer: Medicare PPO

## 2012-07-20 NOTE — Telephone Encounter (Signed)
Note to close encounter. [name], [name]  

## 2012-07-26 ENCOUNTER — Inpatient Hospital Stay (HOSPITAL_COMMUNITY)
Admission: AD | Admit: 2012-07-26 | Discharge: 2012-08-02 | DRG: 558 | Disposition: A | Discharge status: Home Health | Payer: Medicare PPO | Source: Ambulatory Visit | Attending: Internal Medicine | Admitting: Internal Medicine

## 2012-07-26 ENCOUNTER — Encounter (HOSPITAL_COMMUNITY): Payer: Self-pay | Admitting: General Practice

## 2012-07-26 DIAGNOSIS — I5022 Chronic systolic (congestive) heart failure: Secondary | ICD-10-CM

## 2012-07-26 DIAGNOSIS — B20 Human immunodeficiency virus [HIV] disease: Secondary | ICD-10-CM

## 2012-07-26 DIAGNOSIS — L03115 Cellulitis of right lower limb: Secondary | ICD-10-CM

## 2012-07-26 DIAGNOSIS — R197 Diarrhea, unspecified: Secondary | ICD-10-CM

## 2012-07-26 DIAGNOSIS — Z21 Asymptomatic human immunodeficiency virus [HIV] infection status: Secondary | ICD-10-CM | POA: Diagnosis present

## 2012-07-26 DIAGNOSIS — I509 Heart failure, unspecified: Secondary | ICD-10-CM | POA: Diagnosis present

## 2012-07-26 DIAGNOSIS — R110 Nausea: Secondary | ICD-10-CM

## 2012-07-26 DIAGNOSIS — J209 Acute bronchitis, unspecified: Secondary | ICD-10-CM

## 2012-07-26 DIAGNOSIS — J9600 Acute respiratory failure, unspecified whether with hypoxia or hypercapnia: Secondary | ICD-10-CM

## 2012-07-26 DIAGNOSIS — Z9089 Acquired absence of other organs: Secondary | ICD-10-CM

## 2012-07-26 DIAGNOSIS — L02419 Cutaneous abscess of limb, unspecified: Secondary | ICD-10-CM | POA: Diagnosis present

## 2012-07-26 DIAGNOSIS — Q211 Atrial septal defect: Secondary | ICD-10-CM

## 2012-07-26 DIAGNOSIS — Z7982 Long term (current) use of aspirin: Secondary | ICD-10-CM

## 2012-07-26 DIAGNOSIS — N946 Dysmenorrhea, unspecified: Secondary | ICD-10-CM

## 2012-07-26 DIAGNOSIS — Z9861 Coronary angioplasty status: Secondary | ICD-10-CM

## 2012-07-26 DIAGNOSIS — R0789 Other chest pain: Secondary | ICD-10-CM | POA: Diagnosis not present

## 2012-07-26 DIAGNOSIS — Z7902 Long term (current) use of antithrombotics/antiplatelets: Secondary | ICD-10-CM

## 2012-07-26 DIAGNOSIS — M1990 Unspecified osteoarthritis, unspecified site: Secondary | ICD-10-CM

## 2012-07-26 DIAGNOSIS — F411 Generalized anxiety disorder: Secondary | ICD-10-CM

## 2012-07-26 DIAGNOSIS — R0602 Shortness of breath: Secondary | ICD-10-CM

## 2012-07-26 DIAGNOSIS — E785 Hyperlipidemia, unspecified: Secondary | ICD-10-CM

## 2012-07-26 DIAGNOSIS — E669 Obesity, unspecified: Secondary | ICD-10-CM

## 2012-07-26 DIAGNOSIS — B009 Herpesviral infection, unspecified: Secondary | ICD-10-CM

## 2012-07-26 DIAGNOSIS — D638 Anemia in other chronic diseases classified elsewhere: Secondary | ICD-10-CM | POA: Diagnosis present

## 2012-07-26 DIAGNOSIS — I2589 Other forms of chronic ischemic heart disease: Secondary | ICD-10-CM | POA: Diagnosis present

## 2012-07-26 DIAGNOSIS — D649 Anemia, unspecified: Secondary | ICD-10-CM

## 2012-07-26 DIAGNOSIS — R079 Chest pain, unspecified: Secondary | ICD-10-CM

## 2012-07-26 DIAGNOSIS — Z87898 Personal history of other specified conditions: Secondary | ICD-10-CM

## 2012-07-26 DIAGNOSIS — E876 Hypokalemia: Secondary | ICD-10-CM

## 2012-07-26 DIAGNOSIS — M79604 Pain in right leg: Secondary | ICD-10-CM | POA: Diagnosis present

## 2012-07-26 DIAGNOSIS — Z9189 Other specified personal risk factors, not elsewhere classified: Secondary | ICD-10-CM

## 2012-07-26 DIAGNOSIS — J4489 Other specified chronic obstructive pulmonary disease: Secondary | ICD-10-CM

## 2012-07-26 DIAGNOSIS — I2510 Atherosclerotic heart disease of native coronary artery without angina pectoris: Secondary | ICD-10-CM

## 2012-07-26 DIAGNOSIS — I10 Essential (primary) hypertension: Secondary | ICD-10-CM

## 2012-07-26 DIAGNOSIS — M6580 Other synovitis and tenosynovitis, unspecified site: Principal | ICD-10-CM | POA: Diagnosis present

## 2012-07-26 DIAGNOSIS — Z87891 Personal history of nicotine dependence: Secondary | ICD-10-CM

## 2012-07-26 DIAGNOSIS — Q2112 Patent foramen ovale: Secondary | ICD-10-CM

## 2012-07-26 DIAGNOSIS — F3289 Other specified depressive episodes: Secondary | ICD-10-CM

## 2012-07-26 DIAGNOSIS — IMO0002 Reserved for concepts with insufficient information to code with codable children: Secondary | ICD-10-CM

## 2012-07-26 DIAGNOSIS — K219 Gastro-esophageal reflux disease without esophagitis: Secondary | ICD-10-CM | POA: Diagnosis present

## 2012-07-26 DIAGNOSIS — I639 Cerebral infarction, unspecified: Secondary | ICD-10-CM

## 2012-07-26 DIAGNOSIS — F329 Major depressive disorder, single episode, unspecified: Secondary | ICD-10-CM | POA: Diagnosis present

## 2012-07-26 DIAGNOSIS — R609 Edema, unspecified: Secondary | ICD-10-CM | POA: Diagnosis present

## 2012-07-26 DIAGNOSIS — R7989 Other specified abnormal findings of blood chemistry: Secondary | ICD-10-CM

## 2012-07-26 DIAGNOSIS — Z888 Allergy status to other drugs, medicaments and biological substances status: Secondary | ICD-10-CM

## 2012-07-26 DIAGNOSIS — J449 Chronic obstructive pulmonary disease, unspecified: Secondary | ICD-10-CM | POA: Diagnosis present

## 2012-07-26 DIAGNOSIS — Z8673 Personal history of transient ischemic attack (TIA), and cerebral infarction without residual deficits: Secondary | ICD-10-CM

## 2012-07-26 DIAGNOSIS — Z79899 Other long term (current) drug therapy: Secondary | ICD-10-CM

## 2012-07-26 DIAGNOSIS — I252 Old myocardial infarction: Secondary | ICD-10-CM

## 2012-07-26 DIAGNOSIS — K644 Residual hemorrhoidal skin tags: Secondary | ICD-10-CM

## 2012-07-26 DIAGNOSIS — M1710 Unilateral primary osteoarthritis, unspecified knee: Secondary | ICD-10-CM | POA: Diagnosis present

## 2012-07-26 DIAGNOSIS — E039 Hypothyroidism, unspecified: Secondary | ICD-10-CM | POA: Diagnosis present

## 2012-07-26 DIAGNOSIS — Z6841 Body Mass Index (BMI) 40.0 and over, adult: Secondary | ICD-10-CM

## 2012-07-26 LAB — COMPREHENSIVE METABOLIC PANEL
Alkaline Phosphatase: 47 U/L (ref 39–117)
BUN: 9 mg/dL (ref 6–23)
CO2: 29 mEq/L (ref 19–32)
Chloride: 102 mEq/L (ref 96–112)
GFR calc Af Amer: >90 mL/min (ref >90)
Glucose, Bld: 100 mg/dL — ABNORMAL HIGH (ref 70–99)
Potassium: 3.4 mEq/L — ABNORMAL LOW (ref 3.5–5.1)
Total Bilirubin: 0.3 mg/dL (ref 0.3–1.2)

## 2012-07-26 LAB — CBC WITH DIFFERENTIAL/PLATELET
Basophils Absolute: 0 10*3/uL (ref 0.0–0.1)
Eosinophils Relative: 2 % (ref 0–5)
Lymphocytes Relative: 27 % (ref 12–46)
MCV: 83.9 fL (ref 78.0–100.0)
Neutro Abs: 3.2 10*3/uL (ref 1.7–7.7)
Platelets: 257 10*3/uL (ref 150–400)
RDW: 14.8 % (ref 11.5–15.5)
WBC: 5.4 10*3/uL (ref 4.0–10.5)

## 2012-07-26 LAB — MRSA PCR SCREENING: MRSA by PCR: NEGATIVE

## 2012-07-26 LAB — SEDIMENTATION RATE: Sed Rate: 64 mm/hr — ABNORMAL HIGH (ref 0–22)

## 2012-07-26 MED ORDER — ALBUTEROL SULFATE (5 MG/ML) 0.5% IN NEBU: 2.5000 mg | INHALATION_SOLUTION | RESPIRATORY_TRACT | Status: DC | PRN

## 2012-07-26 MED ORDER — ELVITEG-COBIC-EMTRICIT-TENOFDF 150-150-200-300 MG PO TABS
1.0000 | ORAL_TABLET | Freq: Every day | ORAL | Status: DC
  Filled 2012-07-26: qty 1

## 2012-07-26 MED ORDER — ISOSORBIDE MONONITRATE ER 60 MG PO TB24
60.0000 mg | ORAL_TABLET | Freq: Every evening | ORAL | Status: DC
  Administered 2012-07-26 – 2012-08-01 (×7): 60 mg via ORAL
  Filled 2012-07-26 (×8): qty 1

## 2012-07-26 MED ORDER — FEBUXOSTAT 40 MG PO TABS
80.0000 mg | ORAL_TABLET | Freq: Every day | ORAL | Status: DC
  Administered 2012-07-27 – 2012-08-02 (×7): 80 mg via ORAL
  Filled 2012-07-26 (×7): qty 2

## 2012-07-26 MED ORDER — BISACODYL 5 MG PO TBEC: 10.0000 mg | DELAYED_RELEASE_TABLET | Freq: Every day | ORAL | Status: DC | PRN

## 2012-07-26 MED ORDER — ENOXAPARIN SODIUM 40 MG/0.4ML ~~LOC~~ SOLN
40.0000 mg | SUBCUTANEOUS | Status: DC
  Administered 2012-07-26 – 2012-07-28 (×3): 40 mg via SUBCUTANEOUS
  Filled 2012-07-26 (×4): qty 0.4

## 2012-07-26 MED ORDER — CLOPIDOGREL BISULFATE 75 MG PO TABS
75.0000 mg | ORAL_TABLET | Freq: Every day | ORAL | Status: DC
  Administered 2012-07-27 – 2012-08-02 (×7): 75 mg via ORAL
  Filled 2012-07-26 (×8): qty 1

## 2012-07-26 MED ORDER — HYDROMORPHONE HCL PF 1 MG/ML IJ SOLN
1.0000 mg | INTRAMUSCULAR | Status: DC | PRN
  Administered 2012-07-30 – 2012-08-02 (×4): 1 mg via INTRAVENOUS
  Filled 2012-07-26 (×6): qty 1

## 2012-07-26 MED ORDER — CARVEDILOL 3.125 MG PO TABS
3.1250 mg | ORAL_TABLET | Freq: Two times a day (BID) | ORAL | Status: DC
  Administered 2012-07-26 – 2012-08-02 (×14): 3.125 mg via ORAL
  Filled 2012-07-26 (×16): qty 1

## 2012-07-26 MED ORDER — HYDROMORPHONE HCL 2 MG PO TABS
4.0000 mg | ORAL_TABLET | ORAL | Status: DC | PRN
  Administered 2012-07-26 – 2012-07-29 (×8): 4 mg via ORAL
  Filled 2012-07-26 (×8): qty 2

## 2012-07-26 MED ORDER — PANTOPRAZOLE SODIUM 20 MG PO TBEC
20.0000 mg | DELAYED_RELEASE_TABLET | Freq: Every day | ORAL | Status: DC
  Administered 2012-07-27 – 2012-07-31 (×5): 20 mg via ORAL
  Filled 2012-07-26 (×5): qty 1

## 2012-07-26 MED ORDER — ONDANSETRON HCL 4 MG/2ML IJ SOLN: 4.0000 mg | Freq: Four times a day (QID) | INTRAMUSCULAR | Status: DC | PRN

## 2012-07-26 MED ORDER — NITROGLYCERIN 0.3 MG SL SUBL
0.3000 mg | SUBLINGUAL_TABLET | SUBLINGUAL | Status: DC | PRN
  Administered 2012-07-31 (×2): 0.3 mg via SUBLINGUAL
  Filled 2012-07-26: qty 100

## 2012-07-26 MED ORDER — ELVITEG-COBIC-EMTRICIT-TENOFDF 150-150-200-300 MG PO TABS
1.0000 | ORAL_TABLET | Freq: Every day | ORAL | Status: DC
  Administered 2012-07-27 – 2012-08-02 (×7): 1 via ORAL
  Filled 2012-07-26 (×8): qty 1

## 2012-07-26 MED ORDER — GABAPENTIN 300 MG PO CAPS
300.0000 mg | ORAL_CAPSULE | Freq: Three times a day (TID) | ORAL | Status: DC
  Administered 2012-07-26 – 2012-08-02 (×21): 300 mg via ORAL
  Filled 2012-07-26 (×23): qty 1

## 2012-07-26 MED ORDER — ADULT MULTIVITAMIN W/MINERALS CH
1.0000 | ORAL_TABLET | Freq: Every day | ORAL | Status: DC
  Administered 2012-07-27 – 2012-08-02 (×7): 1 via ORAL
  Filled 2012-07-26 (×7): qty 1

## 2012-07-26 MED ORDER — SODIUM CHLORIDE 0.9 % IV SOLN
INTRAVENOUS | Status: DC
  Administered 2012-07-27: 20 mL/h via INTRAVENOUS
  Administered 2012-07-29: 19:00:00 via INTRAVENOUS

## 2012-07-26 MED ORDER — TRAZODONE HCL 50 MG PO TABS
50.0000 mg | ORAL_TABLET | Freq: Every day | ORAL | Status: DC
  Administered 2012-07-26 – 2012-08-01 (×7): 50 mg via ORAL
  Filled 2012-07-26 (×8): qty 1

## 2012-07-26 NOTE — Progress Notes (Signed)
NURSING PROGRESS NOTE  [name] [id number] Admission Data: 07/26/2012 4:49 PM Attending Provider: Zannie Cove, MD JXB:JYNWGN,FAOZ, MD Code Status: FULL  [name] is a 51 y.o. female patient admitted from MD Office States pain in right foot 10/10. MD notified for orders.  Blood pressure 140/84, pulse 88, temperature 98.4 F (36.9 C), temperature source Oral, resp. rate 20, last menstrual period 05/14/2011, SpO2 97.00%.   Allergies:  Atorvastatin  Past Medical History:   has a past medical history of Cholecystitis; Hypercholesterolemia; Fibroids; HIV (human immunodeficiency virus infection) (05/27/11); Pelvic pain in female (10/14/11); PMB (postmenopausal bleeding); Increased BMI (05/27/11); Anxiety; Depression; Hypertension; H/O varicella; History of measles, mumps, or rubella; Blood transfusion; Hypothyroidism; CHF (congestive heart failure); Myocardial infarction (07/2004); COPD (chronic obstructive pulmonary disease); Pneumonia (1980's); Iron deficiency anemia; Lower GI bleed (04/24/2012); Stroke (12/2011); and Arthritis.  Past Surgical History:   has past surgical history that includes Cesarean section (1990; 1995); Retinal laser procedure (1993); Eye surgery; LEEP (2012); TEE without cardioversion (12/21/2011); Cholecystectomy (1990's); Coronary angioplasty with stent (07/2004; 04/2007); and Cardiac catheterization (07/2007).  Social History:   reports that she quit smoking about 6 years ago. Her smoking use included Cigarettes. She has a 14.5 pack-year smoking history. She has never used smokeless tobacco. She reports that she drinks alcohol. She reports that she does not use illicit drugs.  Skin: right lower extremity swollen, warm, tender.   Orientation to room, and floor completed with information packet given to patient/family. Admission INP armband ID verified with patient/family, and in place.   SR up x 2, fall assessment complete, with patient and family able to  verbalize understanding of risk associated with falls, and verbalized understanding to call for assistance before getting out of bed.   Call light within reach. Patient able to voice and demonstrate understanding of unit orientation instructions.   Will cont to eval and treat per MD orders.  Rosalie Doctor, RN
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
## 2012-07-27 ENCOUNTER — Inpatient Hospital Stay (HOSPITAL_COMMUNITY): Payer: Medicare PPO

## 2012-07-27 DIAGNOSIS — M869 Osteomyelitis, unspecified: Secondary | ICD-10-CM

## 2012-07-27 DIAGNOSIS — M7989 Other specified soft tissue disorders: Secondary | ICD-10-CM

## 2012-07-27 LAB — CBC
MCH: 25.1 pg — ABNORMAL LOW (ref 26.0–34.0)
MCHC: 30.5 g/dL (ref 30.0–36.0)
Platelets: 245 10*3/uL (ref 150–400)
RBC: 3.35 MIL/uL — ABNORMAL LOW (ref 3.87–5.11)
RDW: 14.9 % (ref 11.5–15.5)

## 2012-07-27 LAB — BASIC METABOLIC PANEL
CO2: 26 mEq/L (ref 19–32)
Calcium: 9.3 mg/dL (ref 8.4–10.5)
GFR calc Af Amer: >90 mL/min (ref >90)
GFR calc non Af Amer: >90 mL/min (ref >90)
Sodium: 139 mEq/L (ref 135–145)

## 2012-07-27 LAB — RETICULOCYTES
RBC.: 3.6 MIL/uL — ABNORMAL LOW (ref 3.87–5.11)
Retic Ct Pct: 1.5 % (ref 0.4–3.1)

## 2012-07-27 LAB — IRON AND TIBC
Saturation Ratios: 7 % — ABNORMAL LOW (ref 20–55)
UIBC: 273 ug/dL (ref 125–400)

## 2012-07-27 MED ORDER — SODIUM CHLORIDE 0.9 % IJ SOLN
10.0000 mL | INTRAMUSCULAR | Status: DC | PRN
  Administered 2012-07-27 – 2012-08-02 (×6): 10 mL

## 2012-07-27 MED ORDER — ACETAMINOPHEN 325 MG PO TABS
650.0000 mg | ORAL_TABLET | Freq: Four times a day (QID) | ORAL | Status: DC | PRN
  Administered 2012-07-31: 650 mg via ORAL
  Filled 2012-07-27: qty 2

## 2012-07-27 MED ORDER — OXYCODONE HCL 5 MG PO TABS
10.0000 mg | ORAL_TABLET | ORAL | Status: DC | PRN
  Administered 2012-07-27 – 2012-08-02 (×23): 10 mg via ORAL
  Filled 2012-07-27 (×14): qty 2
  Filled 2012-07-27: qty 1
  Filled 2012-07-27 (×8): qty 2

## 2012-07-27 MED ORDER — GADOBENATE DIMEGLUMINE 529 MG/ML IV SOLN
20.0000 mL | Freq: Once | INTRAVENOUS | Status: AC
  Administered 2012-07-27: 20 mL via INTRAVENOUS

## 2012-07-27 MED ORDER — SIMVASTATIN 10 MG PO TABS
10.0000 mg | ORAL_TABLET | Freq: Every day | ORAL | Status: DC
  Filled 2012-07-27: qty 1

## 2012-07-27 MED ORDER — POTASSIUM CHLORIDE CRYS ER 20 MEQ PO TBCR
40.0000 meq | EXTENDED_RELEASE_TABLET | Freq: Once | ORAL | Status: AC
  Administered 2012-07-27: 40 meq via ORAL
  Filled 2012-07-27: qty 2

## 2012-07-27 NOTE — Progress Notes (Signed)
PATIENT ID: [name]        Subjective: Patient with continued right lower extremity pain. Sitting comfortably but with any movement is in mod-severe pain.  Reports PICC line placed this afternoon. No other complaints or concerns.  Objective:  Filed Vitals:   07/27/12 1414  BP: 133/79  Pulse: 90  Temp: 98.1 F (36.7 C)  Resp: 20     Awake, alert and orientated Right foot and ankle swollen, with mild erythema, extremely tender to palpation Skin intact Some ROM, mostly limited by pain and swelling Intact sensation to light touch  Labs:   Basename 07/27/12 0730 07/26/12 1651  HGB 8.4* 9.4*   Basename 07/27/12 0730 07/26/12 1651  WBC 4.5 5.4  RBC 3.35* 3.67*  HCT 27.5* 30.8*  PLT 245 257   Basename 07/27/12 0730 07/26/12 1651  NA 139 138  K 3.3* 3.4*  CL 103 102  CO2 26 29  BUN 10 9  CREATININE 0.66 0.82  GLUCOSE 112* 100*  CALCIUM 9.3 9.3    Assessment and Plan: R LE pain and swelling, continue suspicion for osteomyelitis Awaiting results of the MRI. If results indicate need for bone biopsy we can do that this weekend, likely Saturday.  Appreciate care from medicine and ID Will continue to follow  VTE proph: Per primary team

## 2012-07-27 NOTE — Progress Notes (Addendum)
PATIENT DETAILS Name: [name] Age: 51 y.o. 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 -MRI with contrast not able to be done as no IV access this am-finally got a PICC line placed (as likely will require prolonged course of antibiotics and frequent blood draws) -Hopefully MRI to be done today -ESR is elevated at 64 -although doppler neg last month-will recheck  HIV - last CD4 450 in 11/13 and Viral load undetectable, continue HAART  -last Echo on 12/2011-EF of 25 to 30 % -care with IVF to prevent vol overload  Anemia -suspect 2/2 chronic disease -claims to have occassional hemorrhoidal bleeds -H/H stable within past range-will check anemia panel and monitor for now  COPD -stable, PRN nebs  CAD/CHF (systolic) - stable, compensated, continue plavix/coreg -? Why Not on ACEI as outpatient-will d/w patient to see if any contra-indications-otherwise will resume  H/o CVA -on Plavix and Statins  GERD -PPI  Disposition: Remain inpatient  DVT Prophylaxis: Prophylactic Lovenox   Code Status: Full code   Procedures:  PICC line-11/21  CONSULTS:  ID  PHYSICAL EXAM: Vital signs in last 24 hours: Filed Vitals:   07/27/12 0432 07/27/12 0812 07/27/12 1105 07/27/12 1414  BP: 124/64 109/72 119/74 133/79  Pulse: 94 77 86 90  Temp: 98.2 F (36.8 C)  98.3 F (36.8 C) 98.1 F (36.7 C)  TempSrc: Oral  Oral Oral  Resp: 20  17 20   Height:      Weight:      SpO2: 98%  98% 97%    Weight change:  Body mass index is 51.05 kg/(m^2).   Gen Exam: Awake and alert with clear  speech.   Neck: Supple, No JVD.   Chest: B/L Clear.   CVS: S1 S2 Regular, no murmurs.  Abdomen: soft, BS +, non tender, non distended.  Extremities: no edema, lower extremities warm to touch.Right lower ext-swollen from dorsum of the foot to the lower half of the leg, mild erythema, and tender to palpation Neurologic: Non Focal.   Skin: No Rash.  Wounds: N/A.    Intake/Output from previous day:  Intake/Output Summary (Last 24 hours) at 07/27/12 1435 Last data filed at 07/27/12 1300  Gross per 24 hour  Intake    476 ml  Output    300 ml  Net    176 ml     LAB RESULTS: CBC  Lab 07/27/12 0730 07/26/12 1651  WBC 4.5 5.4  HGB 8.4* 9.4*  HCT 27.5* 30.8*  PLT 245 257  MCV 82.1 83.9  MCH 25.1* 25.6*  MCHC 30.5 30.5  RDW 14.9 14.8  LYMPHSABS -- 1.4  MONOABS -- 0.6  EOSABS -- 0.1  BASOSABS -- 0.0  BANDABS -- --    Chemistries   Lab 07/27/12 0730 07/26/12 1651  NA 139 138  K 3.3* 3.4*  CL 103 102  CO2 26 29  GLUCOSE 112* 100*  BUN 10 9  CREATININE 0.66 0.82  CALCIUM 9.3 9.3  MG -- --    CBG: No results found for this basename: GLUCAP:5 in the last 168 hours  GFR Estimated Creatinine Clearance: 106 ml/min (by C-G formula based on Cr of 0.66).  Coagulation profile No results found for this basename: INR:5,PROTIME:5 in the last 168 hours  Cardiac Enzymes No results found for this basename: CK:3,CKMB:3,TROPONINI:3,MYOGLOBIN:3 in the last 168 hours  No components found with this basename: POCBNP:3 No results found for this basename: DDIMER:2 in the last 72 hours No results found for this basename: HGBA1C:2 in the last 72 hours No results found for this basename: CHOL:2,HDL:2,LDLCALC:2,TRIG:2,CHOLHDL:2,LDLDIRECT:2 in the last 72 hours No results found for this basename: TSH,T4TOTAL,FREET3,T3FREE,THYROIDAB in the last 72 hours No results found for this basename: VITAMINB12:2,FOLATE:2,FERRITIN:2,TIBC:2,IRON:2,RETICCTPCT:2 in the last 72 hours No results found for  this basename: LIPASE:2,AMYLASE:2 in the last 72 hours  Urine Studies No results found for this basename: UACOL:2,UAPR:2,USPG:2,UPH:2,UTP:2,UGL:2,UKET:2,UBIL:2,UHGB:2,UNIT:2,UROB:2,ULEU:2,UEPI:2,UWBC:2,URBC:2,UBAC:2,CAST:2,CRYS:2,UCOM:2,BILUA:2 in the last 72 hours  MICROBIOLOGY: Recent Results (from the past 240 hour(s))  MRSA PCR SCREENING     Status: Normal   Collection Time   07/26/12  5:04 PM      Component Value Range Status Comment   MRSA by PCR NEGATIVE  NEGATIVE Final     RADIOLOGY STUDIES/RESULTS: Dg Tibia/fibula Right  07/05/2012  *RADIOLOGY REPORT*  Clinical Data: Increase swelling and pain right lower extremity.  RIGHT TIBIA AND FIBULA - 2 VIEW  Comparison: 07/04/2012  Findings: Right tibia and fibula appear intact.  No malalignment or displaced fracture.  Degenerative changes of the ankle joint noted. Mild diffuse soft tissue swelling.  Patchy osteopenia noted at the ankle.  IMPRESSION: No acute osseous abnormality.  Right ankle degenerative changes and periarticular osteopenia.   Original Report Authenticated By: Judie Petit. Ruel Favors, M.D.    Dg Ankle 2 Views Right  07/04/2012  *RADIOLOGY REPORT*  Clinical Data: Pain post fall.  RIGHT ANKLE - 2 VIEW  Comparison: 06/09/2012  Findings: Calcaneal spur.  Ankle mortise intact. There has been some subchondral resorption suggesting disuse osteopenia. Ankle mortise intact. Negative for fracture, dislocation, or other acute abnormality.  Normal alignment. No significant degenerative change. Regional soft tissues unremarkable.  IMPRESSION:  No acute abnormality   Original Report Authenticated By: Osa Craver, M.D.    Dg Shoulder Left  07/04/2012  *RADIOLOGY REPORT*  Clinical Data: Pain post fall.  LEFT SHOULDER - 2+ VIEW  Comparison: 02/14/2012  Findings: Negative for fracture, dislocation, or other acute abnormality.  Normal alignment and mineralization. No significant degenerative change.  Regional soft tissues unremarkable.   IMPRESSION:  Negative   Original Report Authenticated By: Osa Craver, M.D.    Dg Foot 2 Views Right  07/04/2012  *RADIOLOGY REPORT*  Clinical Data: Pain post fall  RIGHT FOOT - 2 VIEW  Comparison: 06/06/2012  Findings: Calcaneal spur at the plantar aponeurosis.  Diffuse soft tissue swelling most marked dorsally.  Negative for fracture, dislocation, or other acute bone injury.  Normal mineralization and alignment.  IMPRESSION:  1.  Negative for fracture or other acute bone injury. 2.  Dorsal soft tissue swelling. 3.  Calcaneal spur   Original Report Authenticated By: Osa Craver, M.D.     MEDICATIONS: Scheduled Meds:   . carvedilol  3.125 mg Oral BID WC  . clopidogrel  75 mg Oral Q breakfast  . elvitegravir-cobicistat-emtricitabine-tenofovir  1 tablet Oral Q breakfast  . enoxaparin (LOVENOX) injection  40 mg Subcutaneous Q24H  . febuxostat  80 mg Oral Daily  . gabapentin  300 mg Oral TID  . isosorbide mononitrate  60 mg Oral QPM  . multivitamin with minerals  1 tablet Oral Daily  . pantoprazole  20 mg Oral Daily  . traZODone  50 mg Oral QHS  . [DISCONTINUED] elvitegravir-cobicistat-emtricitabine-tenofovir  1 tablet Oral Q breakfast   Continuous Infusions:   . sodium chloride     PRN Meds:.acetaminophen, albuterol, bisacodyl, HYDROmorphone (DILAUDID) injection, HYDROmorphone, nitroGLYCERIN, ondansetron (ZOFRAN) IV, oxyCODONE  Antibiotics: Anti-infectives     Start     Dose/Rate Route Frequency Ordered Stop   07/27/12 0800   elvitegravir-cobicistat-emtricitabine-tenofovir (STRIBILD) 150-150-200-300 MG tablet 1 tablet  Status:  Discontinued        1 tablet Oral Daily with breakfast 07/26/12 1642 07/26/12 1703   07/27/12 0800   elvitegravir-cobicistat-emtricitabine-tenofovir (STRIBILD) 150-150-200-300 MG tablet 1 tablet        1 tablet Oral Daily with breakfast 07/26/12 1704             Jeoffrey Massed, MD  [facility] Pager:[phone]  [phone]  If 7PM-7AM, please contact night-coverage [email or web address] Password [email or web address] 07/27/2012, 2:35 PM   LOS: 1 day

## 2012-07-27 NOTE — Procedures (Signed)
Interventional Radiology Procedure Note  Procedure: Left basilic vein 43.5 cm dual lumen PowerPICC placed.  Catheter tip positioned at the superior cavoatrial jxn and ready for immediate use. Complications: None Recommendations: - Routine line care  Signed,  Sterling Big, MD Vascular & Interventional Radiologist [facility]

## 2012-07-27 NOTE — Consult Note (Signed)
[facility]  Total days of antibiotics 0               Reason for Consult: presumed osteomyelitis    Referring Physician: ghimire  Active Problems:  HUMAN IMMUNODEFICIENCY VIRUS [HIV]  COPD  Right leg pain    HPI: [name] is a 51 y.o. female with HIV, CD 4 count 450/ VL <20 on stribild, and recent stroke in oct 2013 who has had right lower leg pain x 1 month. Her symptoms started in October, initially thought to be gouty flare vs. Sequelae of sprained ankle and cellulitis, initially treated with vancomycin and sent home on 2 wks of clindamycin. Her symptoms subsequently worsened throughout the next few weeks, she was treated with bactrim to treat for cellulitis since it involved last 1/3rd of her distal leg and her ankle. Her inflammatory markers at the end of October were elevated at sed rate of 106 and crp of 2.3. Her ankle at one time was fitted for a boot which helped with swelling however, the nursing home has not been placing  Her boot on..thus the swelling in the right ankle and foot continue to worsen. She states that she has had increasing pain to her ankle, swelling, sharp pain in bone, and intermittent fevers was seen by orthopedics yesterday who were concern with osteomyelitis of ankle due to xray changes.he was unable to aspirate fluid from the joint. She was admitted on 11/20 for evaluation of osteomyelitis with imaging and possible aspirate and PICC line placement for IV antibiotics.  From HIV standpoint, she has been taking her meds without difficulty and no missing doses  Past Medical History  Diagnosis Date  . Cholecystitis     Gall bladder removed   . Hypercholesterolemia   . Fibroids   . HIV (human immunodeficiency virus infection) 05/27/11  . Pelvic pain in female 10/14/11  . PMB (postmenopausal bleeding)     Since 2010  . Increased BMI 05/27/11  . Anxiety   . Depression   . Hypertension   . H/O varicella   . History of measles,  mumps, or rubella   . Blood transfusion   . Hypothyroidism   . CHF (congestive heart failure)   . Myocardial infarction 07/2004  . COPD (chronic obstructive pulmonary disease)   . Pneumonia 1980's    "once"  . Iron deficiency anemia   . Lower GI bleed 04/24/2012    recurrent; "last episode 03/2012)  . Stroke 12/2011    residual "speech messes up when I get sick or real tired" (04/24/2012)  . Arthritis     "knees"    Allergies:  Allergies  Allergen Reactions  . Atorvastatin Other (See Comments)    Patient states that the medication affects her memory   MEDICATIONS:    . carvedilol  3.125 mg Oral BID WC  . clopidogrel  75 mg Oral Q breakfast  . elvitegravir-cobicistat-emtricitabine-tenofovir  1 tablet Oral Q breakfast  . enoxaparin (LOVENOX) injection  40 mg Subcutaneous Q24H  . febuxostat  80 mg Oral Daily  . gabapentin  300 mg Oral TID  . isosorbide mononitrate  60 mg Oral QPM  . multivitamin with minerals  1 tablet Oral Daily  . pantoprazole  20 mg Oral Daily  . traZODone  50 mg Oral QHS  . [DISCONTINUED] elvitegravir-cobicistat-emtricitabine-tenofovir  1 tablet Oral Q breakfast    History  Substance Use Topics  . Smoking status: Former Smoker -- 0.5 packs/day for  29 years    Types: Cigarettes    Quit date: 05/07/2006  . Smokeless tobacco: Never Used  . Alcohol Use: 0.0 oz/week    2-3 Glasses of wine per week     Comment: 04/24/12 "wine once or twice q couple months"    Family History  Problem Relation Age of Onset  . Hypertension Mother   . Hyperlipidemia Mother   . Obesity Mother   . Heart disease Mother   . Hypertension Maternal Aunt   . Hyperlipidemia Maternal Aunt   . Obesity Maternal Aunt   . Stroke Maternal Aunt      Review of Systems  Constitutional: Negative for fever, chills, diaphoresis, activity change, appetite change, fatigue and unexpected weight change.  HENT: Negative for congestion, sore throat, rhinorrhea, sneezing, trouble swallowing  and sinus pressure.  Eyes: Negative for photophobia and visual disturbance.  Respiratory: Negative for cough, chest tightness, shortness of breath, wheezing and stridor.  Cardiovascular: Negative for chest pain, palpitations and leg swelling.  Gastrointestinal: Negative for nausea, vomiting, abdominal pain, diarrhea, constipation, blood in stool, abdominal distention and anal bleeding.  Genitourinary: Negative for dysuria, hematuria, flank pain and difficulty urinating.  Musculoskeletal: right ankle pain, predominantly right lateral malleolus Skin: Negative for color change, pallor, rash and wound.  Neurological: Negative for dizziness, tremors, weakness and light-headedness.  Hematological: Negative for adenopathy. Does not bruise/bleed easily.  Psychiatric/Behavioral: Negative for behavioral problems, confusion, sleep disturbance, dysphoric mood, decreased concentration and agitation.     OBJECTIVE: Temp:  [98 F (36.7 C)-98.4 F (36.9 C)] 98.3 F (36.8 C) (11/21 1105) Pulse Rate:  [77-94] 86  (11/21 1105) Resp:  [17-20] 17  (11/21 1105) BP: (109-140)/(64-84) 119/74 mmHg (11/21 1105) SpO2:  [97 %-98 %] 98 % (11/21 1105) Weight:  [279 lb 1.6 oz (126.6 kg)] 279 lb 1.6 oz (126.6 kg) (11/20 1550)  Physical Exam  Constitutional:  oriented to person, place, and time. Obese female. No distress.  HENT:  Mouth/Throat: Oropharynx is clear and moist. No oropharyngeal exudate.  Cardiovascular: Normal rate, regular rhythm and normal heart sounds. Exam reveals no gallop and no friction rub.  No murmur heard.  Pulmonary/Chest: Effort normal and breath sounds normal. No respiratory distress.  no wheezes.  Abdominal: Soft. Bowel sounds are normal. He exhibits no distension. There is no tenderness.  Lymphadenopathy:  no cervical adenopathy.  Ext= right foot +1 edema to dorsum of foot and ankle. Exquisite tenderness to right lateral malleolus, some increased warmth, not significant appreciable  erythema Skin: Skin is warm and dry. No rash noted. No erythema.     LABS: Results for orders placed during the hospital encounter of 07/26/12 (from the past 48 hour(s))  COMPREHENSIVE METABOLIC PANEL     Status: Abnormal   Collection Time   07/26/12  4:51 PM      Component Value Range Comment   Sodium 138  135 - 145 mEq/L    Potassium 3.4 (*) 3.5 - 5.1 mEq/L    Chloride 102  96 - 112 mEq/L    CO2 29  19 - 32 mEq/L    Glucose, Bld 100 (*) 70 - 99 mg/dL    BUN 9  6 - 23 mg/dL    Creatinine, Ser 1.61  0.50 - 1.10 mg/dL    Calcium 9.3  8.4 - 09.6 mg/dL    Total Protein 7.4  6.0 - 8.3 g/dL    Albumin 3.2 (*) 3.5 - 5.2 g/dL    AST 13  0 - 37  U/L    ALT 8  0 - 35 U/L    Alkaline Phosphatase 47  39 - 117 U/L    Total Bilirubin 0.3  0.3 - 1.2 mg/dL    GFR calc non Af Amer 81 (*) >90 mL/min    GFR calc Af Amer >90  >90 mL/min   CBC WITH DIFFERENTIAL     Status: Abnormal   Collection Time   07/26/12  4:51 PM      Component Value Range Comment   WBC 5.4  4.0 - 10.5 K/uL    RBC 3.67 (*) 3.87 - 5.11 MIL/uL    Hemoglobin 9.4 (*) 12.0 - 15.0 g/dL    HCT 16.1 (*) 09.6 - 46.0 %    MCV 83.9  78.0 - 100.0 fL    MCH 25.6 (*) 26.0 - 34.0 pg    MCHC 30.5  30.0 - 36.0 g/dL    RDW 04.5  40.9 - 81.1 %    Platelets 257  150 - 400 K/uL    Neutrophils Relative 60  43 - 77 %    Neutro Abs 3.2  1.7 - 7.7 K/uL    Lymphocytes Relative 27  12 - 46 %    Lymphs Abs 1.4  0.7 - 4.0 K/uL    Monocytes Relative 11  3 - 12 %    Monocytes Absolute 0.6  0.1 - 1.0 K/uL    Eosinophils Relative 2  0 - 5 %    Eosinophils Absolute 0.1  0.0 - 0.7 K/uL    Basophils Relative 1  0 - 1 %    Basophils Absolute 0.0  0.0 - 0.1 K/uL   SEDIMENTATION RATE     Status: Abnormal   Collection Time   07/26/12  4:51 PM      Component Value Range Comment   Sed Rate 64 (*) 0 - 22 mm/hr   C-REACTIVE PROTEIN     Status: Abnormal   Collection Time   07/26/12  4:51 PM      Component Value Range Comment   CRP 1.3 (*) <0.60 mg/dL    MRSA PCR SCREENING     Status: Normal   Collection Time   07/26/12  5:04 PM      Component Value Range Comment   MRSA by PCR NEGATIVE  NEGATIVE   CBC     Status: Abnormal   Collection Time   07/27/12  7:30 AM      Component Value Range Comment   WBC 4.5  4.0 - 10.5 K/uL    RBC 3.35 (*) 3.87 - 5.11 MIL/uL    Hemoglobin 8.4 (*) 12.0 - 15.0 g/dL    HCT 91.4 (*) 78.2 - 46.0 %    MCV 82.1  78.0 - 100.0 fL    MCH 25.1 (*) 26.0 - 34.0 pg    MCHC 30.5  30.0 - 36.0 g/dL    RDW 95.6  21.3 - 08.6 %    Platelets 245  150 - 400 K/uL   BASIC METABOLIC PANEL     Status: Abnormal   Collection Time   07/27/12  7:30 AM      Component Value Range Comment   Sodium 139  135 - 145 mEq/L    Potassium 3.3 (*) 3.5 - 5.1 mEq/L    Chloride 103  96 - 112 mEq/L    CO2 26  19 - 32 mEq/L    Glucose, Bld 112 (*) 70 - 99 mg/dL    BUN  10  6 - 23 mg/dL    Creatinine, Ser 1.61  0.50 - 1.10 mg/dL    Calcium 9.3  8.4 - 09.6 mg/dL    GFR calc non Af Amer >90  >90 mL/min    GFR calc Af Amer >90  >90 mL/min     MICRO: none IMAGING: No results found.   Assessment/Plan: 51yo F with HIV, well controlled, who has 1 month history of ankle pain, intermittently treated for cellulitis and concern for osteomyelitis  1) presumed osteo: recommend to get MRI of right ankle/foot. If there is any fluid collection, would get aspirate in order to send to culture and narrow spectrum on antibiotics treatment 2) if imaging is consistent with osteomyelitis, recommend to treat for 4-6 wk IV antibiotics then consider oral suppressive therapy 3) since patient is clinically stable, recommend to hold off on antibiotics until can get a specimen for culture.  More recs to follow . Thank you for consultation.  Duke Salvia Drue Second MD MPH [facility] [phone]

## 2012-07-27 NOTE — Progress Notes (Signed)
Clinical Social Work Department BRIEF PSYCHOSOCIAL ASSESSMENT 07/27/2012  Patient:  [name], [name]     Account Number:  [id number]     Admit date:  07/26/2012  Clinical Social Worker:  Dennison Bulla  Date/Time:  07/27/2012 11:20 AM  Referred by:  Physician  Date Referred:  07/27/2012 Referred for  SNF Placement   Other Referral:   Interview type:  Patient Other interview type:    PSYCHOSOCIAL DATA Living Status:  FACILITY Admitted from facility:  [facility] Level of care:  Skilled Nursing Facility Primary support name:  Jillyn Hidden Primary support relationship to patient:  FRIEND Degree of support available:   Adequate    CURRENT CONCERNS Current Concerns  Post-Acute Placement   Other Concerns:    SOCIAL WORK ASSESSMENT / PLAN CSW received referral due to patient being admitted from a facility. CSW reviewed chart and spoke with patient at bedside. No visitors present.    CSW introduced myself and explained role. Patient reports that she went to [facility] about a month ago after she started having problems with her foot. Patient reports that since she is still in pain she feels she will have to return to SNF for a few weeks. Patient agreeable to SNF coming to visit her in the hospital because she has questions for them. CSW spoke with SNF who is agreeable to patient returning but needs PT/OT evaluations for insurance approval.  CSW asked MD who was agreeable to order.    CSW completed FL2 and sent information to facility to review. CSW will continue to follow.   Assessment/plan status:  Psychosocial Support/Ongoing Assessment of Needs Other assessment/ plan:   Information/referral to community resources:   Will return to SNF    PATIENT'S/FAMILY'S RESPONSE TO PLAN OF CARE: Patient is alert and oriented. Patient agreeable to CSW consult and engaged throughout assessment. Patient desires to return to SNF at dc.

## 2012-07-27 NOTE — Progress Notes (Signed)
*  PRELIMINARY RESULTS* Vascular Ultrasound Right lower extremity venous duplex has been completed.  Preliminary findings: Right:  No obvious evidence of DVT or Baker's cyst. Could not clearly visualize all veins.   Farrel Demark, RDMS, RVT 07/27/2012, 4:41 PM

## 2012-07-28 DIAGNOSIS — D649 Anemia, unspecified: Secondary | ICD-10-CM

## 2012-07-28 LAB — BASIC METABOLIC PANEL
Calcium: 9.2 mg/dL (ref 8.4–10.5)
GFR calc non Af Amer: >90 mL/min (ref >90)
Glucose, Bld: 112 mg/dL — ABNORMAL HIGH (ref 70–99)
Sodium: 137 mEq/L (ref 135–145)

## 2012-07-28 LAB — CBC
MCH: 25 pg — ABNORMAL LOW (ref 26.0–34.0)
MCHC: 29.9 g/dL — ABNORMAL LOW (ref 30.0–36.0)
Platelets: 258 10*3/uL (ref 150–400)
RDW: 15 % (ref 11.5–15.5)

## 2012-07-28 MED ORDER — LISINOPRIL 2.5 MG PO TABS
2.5000 mg | ORAL_TABLET | Freq: Every day | ORAL | Status: DC
  Administered 2012-07-28 – 2012-08-02 (×6): 2.5 mg via ORAL
  Filled 2012-07-28 (×6): qty 1

## 2012-07-28 NOTE — Progress Notes (Signed)
Clinical Social Work  Per MD, patient not ready to dc. MD agreeable to order PT evaluation for insurance approval for SNF. CSW will continue to follow to assist with dc planning.  [place], [place] [phone]

## 2012-07-28 NOTE — Progress Notes (Signed)
PATIENT ID: [name]        Subjective: Continued pain RLE improving somewhat  Objective:  Filed Vitals:   07/28/12 1407  BP: 112/75  Pulse: 94  Temp: 98.6 F (37 C)  Resp: 18     R LE swollen and warm to touch laterally  Labs:   Basename 07/28/12 0500 07/27/12 0730 07/26/12 1651  HGB 8.6* 8.4* 9.4*   Basename 07/28/12 0500 07/27/12 0730  WBC 5.1 4.5  RBC 3.44* 3.35*  HCT 28.8* 27.5*  PLT 258 245   Basename 07/28/12 0500 07/27/12 0730  NA 137 139  K 3.8 3.3*  CL 103 103  CO2 28 26  BUN 11 10  CREATININE 0.66 0.66  GLUCOSE 112* 112*  CALCIUM 9.2 9.3    Assessment and Plan:Difficult problem No definite dx of infection- normal WBC, and CRP and ESR have both come down by half since last admit. Could be gout, but I was unable to aspirate any fluid from joint Could inject with steroid to decrease synovitis or could do open synovial and bone bx I think if ID feels likelihood of infection is low based on all evidence and clinical picture, steroid injection would be least invasive and potentially very helpful. Discussed with patient, will try steroid injection to decrease synovitis, but should be done under XR guidance to ensure it gets in the joint. NPO past midnight

## 2012-07-28 NOTE — Progress Notes (Signed)
PATIENT DETAILS Name: [name] Age: 51 y.o. Sex: female Date of Birth: 25-May-1961 Admit Date: 07/26/2012 Admitting Physician Zannie Cove, MD ZOX:[id number],WUJW, MD  Subjective: Continues to have pain and swelling over the Right foot and lower leg  Assessment/Plan: Active Problems: Suspected Osteomyelitis of RLE -MRI Foot and Ankle-not showing definite osteomyelitis, showing Synovitis and some bone edema, d/w Dr's Chandler and Comer-for OR tomorrow to drain synovial fluid and possible bone bx -holding off on antibiotics till biopsy done-ID advising same as well. -doppler neg for DVT  HIV - last CD4 450 in 11/13 and Viral load undetectable, continue HAART   Anemia -suspect 2/2 chronic disease -claims to have occassional hemorrhoidal bleeds -H/H stable within past range-will check anemia panel and monitor for now  COPD -stable, PRN nebs  CAD/CHF (systolic) - stable, compensated, continue plavix/coreg -no obvious contra-indications for ACEI-start low dose Lisinopril -care with IVF to prevent vol overload -last Echo on 12/2011-EF of 25 to 30 %  HTN -controlled with Coreg, Imdur and now adding Lisinopril  H/o CVA -on Plavix and Statins  GERD -PPI  Disposition: Remain inpatient  DVT Prophylaxis: Prophylactic Lovenox   Code Status: Full code   Procedures:  PICC line-11/21  CONSULTS:  ID  PHYSICAL EXAM: Vital signs in last 24 hours: Filed Vitals:   07/27/12 1414 07/27/12 2145 07/28/12 0534 07/28/12 0845  BP: 133/79 122/94 124/81 153/84  Pulse: 90 94 96 97  Temp: 98.1 F (36.7 C) 98.6 F (37 C) 98.6 F (37 C)   TempSrc: Oral Oral Oral   Resp: 20 20 16    Height:      Weight:      SpO2: 97% 99% 99%     Weight change:  Body mass index is 51.05 kg/(m^2).   Gen Exam: Awake and alert with clear speech.   Neck: Supple, No JVD.   Chest: B/L Clear.   CVS: S1 S2 Regular, no murmurs.  Abdomen: soft, BS +, non tender, non distended.  Extremities:  no edema, lower extremities warm to touch.Right lower ext-swollen from dorsum of the foot to the lower half of the leg, mild erythema, and tender to palpation Neurologic: Non Focal.   Skin: No Rash.  Wounds: N/A.    Intake/Output from previous day:  Intake/Output Summary (Last 24 hours) at 07/28/12 1034 Last data filed at 07/28/12 0446  Gross per 24 hour  Intake 404.33 ml  Output    350 ml  Net  54.33 ml     LAB RESULTS: CBC  Lab 07/28/12 0500 07/27/12 0730 07/26/12 1651  WBC 5.1 4.5 5.4  HGB 8.6* 8.4* 9.4*  HCT 28.8* 27.5* 30.8*  PLT 258 245 257  MCV 83.7 82.1 83.9  MCH 25.0* 25.1* 25.6*  MCHC 29.9* 30.5 30.5  RDW 15.0 14.9 14.8  LYMPHSABS -- -- 1.4  MONOABS -- -- 0.6  EOSABS -- -- 0.1  BASOSABS -- -- 0.0  BANDABS -- -- --    Chemistries   Lab 07/28/12 0500 07/27/12 0730 07/26/12 1651  NA 137 139 138  K 3.8 3.3* 3.4*  CL 103 103 102  CO2 28 26 29   GLUCOSE 112* 112* 100*  BUN 11 10 9   CREATININE 0.66 0.66 0.82  CALCIUM 9.2 9.3 9.3  MG -- -- --    CBG: No results found for this basename: GLUCAP:5 in the last 168 hours  GFR Estimated Creatinine Clearance: 106 ml/min (by C-G formula based on Cr of 0.66).  Coagulation profile No results found for  this basename: INR:5,PROTIME:5 in the last 168 hours  Cardiac Enzymes No results found for this basename: CK:3,CKMB:3,TROPONINI:3,MYOGLOBIN:3 in the last 168 hours  No components found with this basename: POCBNP:3 No results found for this basename: DDIMER:2 in the last 72 hours No results found for this basename: HGBA1C:2 in the last 72 hours No results found for this basename: CHOL:2,HDL:2,LDLCALC:2,TRIG:2,CHOLHDL:2,LDLDIRECT:2 in the last 72 hours No results found for this basename: TSH,T4TOTAL,FREET3,T3FREE,THYROIDAB in the last 72 hours  Basename 07/27/12 1530  VITAMINB12 368  FOLATE --  FERRITIN 31  TIBC 294  IRON 21*  RETICCTPCT 1.5   No results found for this basename: LIPASE:2,AMYLASE:2 in the  last 72 hours  Urine Studies No results found for this basename: UACOL:2,UAPR:2,USPG:2,UPH:2,UTP:2,UGL:2,UKET:2,UBIL:2,UHGB:2,UNIT:2,UROB:2,ULEU:2,UEPI:2,UWBC:2,URBC:2,UBAC:2,CAST:2,CRYS:2,UCOM:2,BILUA:2 in the last 72 hours  MICROBIOLOGY: Recent Results (from the past 240 hour(s))  MRSA PCR SCREENING     Status: Normal   Collection Time   07/26/12  5:04 PM      Component Value Range Status Comment   MRSA by PCR NEGATIVE  NEGATIVE Final     RADIOLOGY STUDIES/RESULTS: Dg Tibia/fibula Right  07/05/2012  *RADIOLOGY REPORT*  Clinical Data: Increase swelling and pain right lower extremity.  RIGHT TIBIA AND FIBULA - 2 VIEW  Comparison: 07/04/2012  Findings: Right tibia and fibula appear intact.  No malalignment or displaced fracture.  Degenerative changes of the ankle joint noted. Mild diffuse soft tissue swelling.  Patchy osteopenia noted at the ankle.  IMPRESSION: No acute osseous abnormality.  Right ankle degenerative changes and periarticular osteopenia.   Original Report Authenticated By: Judie Petit. Ruel Favors, M.D.    Dg Ankle 2 Views Right  07/04/2012  *RADIOLOGY REPORT*  Clinical Data: Pain post fall.  RIGHT ANKLE - 2 VIEW  Comparison: 06/09/2012  Findings: Calcaneal spur.  Ankle mortise intact. There has been some subchondral resorption suggesting disuse osteopenia. Ankle mortise intact. Negative for fracture, dislocation, or other acute abnormality.  Normal alignment. No significant degenerative change. Regional soft tissues unremarkable.  IMPRESSION:  No acute abnormality   Original Report Authenticated By: Osa Craver, M.D.    Dg Shoulder Left  07/04/2012  *RADIOLOGY REPORT*  Clinical Data: Pain post fall.  LEFT SHOULDER - 2+ VIEW  Comparison: 02/14/2012  Findings: Negative for fracture, dislocation, or other acute abnormality.  Normal alignment and mineralization. No significant degenerative change.  Regional soft tissues unremarkable.  IMPRESSION:  Negative   Original Report  Authenticated By: Osa Craver, M.D.    Dg Foot 2 Views Right  07/04/2012  *RADIOLOGY REPORT*  Clinical Data: Pain post fall  RIGHT FOOT - 2 VIEW  Comparison: 06/06/2012  Findings: Calcaneal spur at the plantar aponeurosis.  Diffuse soft tissue swelling most marked dorsally.  Negative for fracture, dislocation, or other acute bone injury.  Normal mineralization and alignment.  IMPRESSION:  1.  Negative for fracture or other acute bone injury. 2.  Dorsal soft tissue swelling. 3.  Calcaneal spur   Original Report Authenticated By: Osa Craver, M.D.     MEDICATIONS: Scheduled Meds:    . carvedilol  3.125 mg Oral BID WC  . clopidogrel  75 mg Oral Q breakfast  . elvitegravir-cobicistat-emtricitabine-tenofovir  1 tablet Oral Q breakfast  . enoxaparin (LOVENOX) injection  40 mg Subcutaneous Q24H  . febuxostat  80 mg Oral Daily  . gabapentin  300 mg Oral TID  . [COMPLETED] gadobenate dimeglumine  20 mL Intravenous Once  . isosorbide mononitrate  60 mg Oral QPM  . multivitamin with  minerals  1 tablet Oral Daily  . pantoprazole  20 mg Oral Daily  . [COMPLETED] potassium chloride  40 mEq Oral Once  . traZODone  50 mg Oral QHS  . [DISCONTINUED] simvastatin  10 mg Oral q1800   Continuous Infusions:    . sodium chloride 20 mL/hr (07/27/12 2033)   PRN Meds:.acetaminophen, albuterol, bisacodyl, HYDROmorphone (DILAUDID) injection, HYDROmorphone, nitroGLYCERIN, ondansetron (ZOFRAN) IV, oxyCODONE, sodium chloride  Antibiotics: Anti-infectives     Start     Dose/Rate Route Frequency Ordered Stop   07/27/12 0800   elvitegravir-cobicistat-emtricitabine-tenofovir (STRIBILD) 150-150-200-300 MG tablet 1 tablet  Status:  Discontinued        1 tablet Oral Daily with breakfast 07/26/12 1642 07/26/12 1703   07/27/12 0800   elvitegravir-cobicistat-emtricitabine-tenofovir (STRIBILD) 150-150-200-300 MG tablet 1 tablet        1 tablet Oral Daily with breakfast 07/26/12 1704              Jeoffrey Massed, MD  [facility] Pager:[phone]  If 7PM-7AM, please contact night-coverage [email or web address] Password [facility] 07/28/2012, 10:34 AM   LOS: 2 days

## 2012-07-28 NOTE — Progress Notes (Signed)
[facility]  Date of Admission:  07/26/2012  Antibiotics: none  Subjective: Continues to have pain  Objective: Temp:  [98.1 F (36.7 C)-98.6 F (37 C)] 98.6 F (37 C) (11/22 0534) Pulse Rate:  [90-97] 97  (11/22 0845) Resp:  [16-20] 16  (11/22 0534) BP: (122-153)/(75-94) 135/75 mmHg (11/22 1100) SpO2:  [97 %-99 %] 99 % (11/22 0534)  General: Awake, alert, nad Skin: no rashes Lungs: CTA B Right leg - notable edema of right foot unchanged ffrom previous (seen in clinic about 2-3 weeks ago).    Lab Results Lab Results  Component Value Date   WBC 5.1 07/28/2012   HGB 8.6* 07/28/2012   HCT 28.8* 07/28/2012   MCV 83.7 07/28/2012   PLT 258 07/28/2012    Lab Results  Component Value Date   CREATININE 0.66 07/28/2012   BUN 11 07/28/2012   NA 137 07/28/2012   K 3.8 07/28/2012   CL 103 07/28/2012   CO2 28 07/28/2012    Lab Results  Component Value Date   ALT 8 07/26/2012   AST 13 07/26/2012   ALKPHOS 47 07/26/2012   BILITOT 0.3 07/26/2012      Microbiology: Recent Results (from the past 240 hour(s))  MRSA PCR SCREENING     Status: Normal   Collection Time   07/26/12  5:04 PM      Component Value Range Status Comment   MRSA by PCR NEGATIVE  NEGATIVE Final     Studies/Results: Mr Foot Right W Wo Contrast  07/28/2012  *RADIOLOGY REPORT*  Clinical Data: Severe foot pain and swelling for 1 month.  MRI OF THE RIGHT FOREFOOT WITHOUT AND WITH CONTRAST  Technique:  Multiplanar, multisequence MR imaging was performed both before and after administration of intravenous contrast.  Contrast: 20mL MULTIHANCE GADOBENATE DIMEGLUMINE 529 MG/ML IV SOLN  Comparison: CT scan dated 06/09/2012  Findings: The patient has diffuse subcutaneous edema of the forefoot particularly on the dorsum of the foot. 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  After contrast administration there is abnormal  enhancement of the joint spaces at the fourth and fifth tarsometatarsal joints and at the first metatarsal phalangeal joint.  No appreciable fluid collections.  Findings  are consistent with synovitis of those joints.  No evidence of osteomyelitis.  No visible soft tissue abscesses.  IMPRESSION:  Synovitis at the fourth and fifth tarsometatarsal joints and the first metatarsal phalangeal joint.  Atypical infection should be considered.   Original Report Authenticated By: Francene Boyers, M.D.    Mr Ankle Right W Wo Contrast  07/28/2012  *RADIOLOGY REPORT*  Clinical Data:  Severe right ankle pain and swelling.  MRI OF THE RIGHT ANKLE WITH AND WITHOUT CONTRAST  Technique:  Multiplanar, multisequence MR imaging of the right ankle was performed before and after the administration of intravenous contrast.  Contrast:  20 ml Multihance  Comparison:  Radiographs dated 07/05/2012 and a CT scan dated 06/09/2012  Findings: There is prominent hypertrophy and edema of the synovium of the ankle joint end of the subtalar joint.  There is patchy edema in the distal tibia and fibula, talus, and calcaneus.  However, there are no discrete fluid collections in the ankle or subtalar joint.  There is also a soft tissue inflammation around the posterior tibialis and flexor digitorum longus and flexor hallucis longus tendons and adjacent to the peroneal tendons at the level of the ankle.  Comparing the osseous structures  with the CT scan performed on 06/09/2012, there has been no bone destruction or progression of arthritic changes noted at that time.  The Achilles tendon and plantar fascia are normal.  The anterior tendons are normal.  IMPRESSION: Findings consistent with severe synovitis at the ankle joint and to a lesser degree at the subtalar joint with secondary inflammatory changes in the bones as described.  However, there is no bone destruction to suggest osteomyelitis there is no fluid in the joints to suggest septic joints.   Given the patient's history, the possibility of an atypical synovitis without pus should be considered. Biopsy of the ankle synovium may be useful.   Original Report Authenticated By: Francene Boyers, M.D.    Ir Fluoro Guide Cv Line Left  07/28/2012  *RADIOLOGY REPORT*  PICC PLACEMENT WITH ULTRASOUND AND FLUOROSCOPIC  GUIDANCE  Clinical History: Right foot infection  Fluoroscopy Time: 0.2 minutes.  Procedure:  The left arm was prepped with chlorhexidine, draped in the usual sterile fashion using maximum barrier technique (cap and mask, sterile gown, sterile gloves, large sterile sheet, hand hygiene and cutaneous antiseptic).  Local anesthesia was attained by infiltration with 1% lidocaine.  Ultrasound demonstrated patency of the eft basilic vein, and this was documented with an image.  Under real-time ultrasound guidance, this vein was accessed with a 21 gauge micropuncture needle and image documentation was performed.  The needle was exchanged over a guidewire for a peel-away sheath through which a 33.5 cm 5 [place] dual lumen power injectable PICC was advanced, and positioned with its tip at the lower SVC/right atrial junction.  Fluoroscopy during the procedure and fluoro spot radiograph confirms appropriate catheter position.  The catheter was flushed, secured to the skin with Prolene sutures, and covered with a sterile dressing.  Complications:  None.  The patient tolerated the procedure well.  IMPRESSION: Successful placement of a left arm PICC with sonographic and fluoroscopic guidance.  The catheter is ready for use.  Signed,  Sterling Big, MD Vascular & Interventional Radiologist [facility]   Original Report Authenticated By: Malachy Moan, M.D.    Ir US Guide Vasc Access Left  07/28/2012  *RADIOLOGY REPORT*  PICC PLACEMENT WITH ULTRASOUND AND FLUOROSCOPIC  GUIDANCE  Clinical History: Right foot infection  Fluoroscopy Time: 0.2 minutes.  Procedure:  The left arm was prepped with  chlorhexidine, draped in the usual sterile fashion using maximum barrier technique (cap and mask, sterile gown, sterile gloves, large sterile sheet, hand hygiene and cutaneous antiseptic).  Local anesthesia was attained by infiltration with 1% lidocaine.  Ultrasound demonstrated patency of the eft basilic vein, and this was documented with an image.  Under real-time ultrasound guidance, this vein was accessed with a 21 gauge micropuncture needle and image documentation was performed.  The needle was exchanged over a guidewire for a peel-away sheath through which a 33.5 cm 5 [place] dual lumen power injectable PICC was advanced, and positioned with its tip at the lower SVC/right atrial junction.  Fluoroscopy during the procedure and fluoro spot radiograph confirms appropriate catheter position.  The catheter was flushed, secured to the skin with Prolene sutures, and covered with a sterile dressing.  Complications:  None.  The patient tolerated the procedure well.  IMPRESSION: Successful placement of a left arm PICC with sonographic and fluoroscopic guidance.  The catheter is ready for use.  Signed,  Sterling Big, MD Vascular & Interventional Radiologist [facility]   Original Report Authenticated By: Malachy Moan, M.D.  Assessment/Plan: 1)  Synovitis - MRI findings noted, no definitive evidence of osteomyelitis.  Possibly due to joint disease.  I wonder if a biopsy of synovium indicated, checking crystals?  No significant joint effusion though.  Would a steroid injection help?  This has been ongoing for more than a month, so less likely infectious in origin.  Edema and pain of foot not new.    Staci Righter, MD [facility] [phone] pager   07/28/2012, 12:19 PM

## 2012-07-29 ENCOUNTER — Encounter (HOSPITAL_COMMUNITY)
Admission: AD | Disposition: A | Discharge status: Home Health | Payer: Self-pay | Source: Ambulatory Visit | Attending: Internal Medicine

## 2012-07-29 ENCOUNTER — Inpatient Hospital Stay (HOSPITAL_COMMUNITY): Payer: Medicare PPO

## 2012-07-29 HISTORY — PX: I&D EXTREMITY: SHX5045

## 2012-07-29 LAB — BASIC METABOLIC PANEL
CO2: 29 mEq/L (ref 19–32)
Calcium: 9.2 mg/dL (ref 8.4–10.5)
Sodium: 143 mEq/L (ref 135–145)

## 2012-07-29 LAB — SURGICAL PCR SCREEN
MRSA, PCR: NEGATIVE
Staphylococcus aureus: NEGATIVE

## 2012-07-29 LAB — GLUCOSE, CAPILLARY: Glucose-Capillary: 81 mg/dL (ref 70–99)

## 2012-07-29 LAB — CBC
MCH: 25.4 pg — ABNORMAL LOW (ref 26.0–34.0)
Platelets: 243 10*3/uL (ref 150–400)
RBC: 3.27 MIL/uL — ABNORMAL LOW (ref 3.87–5.11)
WBC: 5.3 10*3/uL (ref 4.0–10.5)

## 2012-07-29 SURGERY — IRRIGATION AND DEBRIDEMENT EXTREMITY
Anesthesia: LOCAL | Site: Ankle | Laterality: Right

## 2012-07-29 MED ORDER — BUPIVACAINE HCL (PF) 0.25 % IJ SOLN
INTRAMUSCULAR | Status: AC
  Filled 2012-07-29: qty 30

## 2012-07-29 MED ORDER — POLYETHYLENE GLYCOL 3350 17 G PO PACK
17.0000 g | PACK | Freq: Two times a day (BID) | ORAL | Status: DC
  Administered 2012-07-29 – 2012-08-01 (×3): 17 g via ORAL
  Filled 2012-07-29 (×9): qty 1

## 2012-07-29 MED ORDER — SENNA 8.6 MG PO TABS
2.0000 | ORAL_TABLET | Freq: Two times a day (BID) | ORAL | Status: DC
  Administered 2012-07-29 – 2012-08-01 (×4): 17.2 mg via ORAL
  Filled 2012-07-29 (×11): qty 2

## 2012-07-29 MED ORDER — TRIAMCINOLONE ACETONIDE 40 MG/ML IJ SUSP
40.0000 mg | INTRAMUSCULAR | Status: AC
  Filled 2012-07-29: qty 1

## 2012-07-29 MED ORDER — TRIAMCINOLONE ACETONIDE 40 MG/ML IJ SUSP
INTRAMUSCULAR | Status: DC | PRN
  Administered 2012-07-29: 1 mL

## 2012-07-29 MED ORDER — ENOXAPARIN SODIUM 40 MG/0.4ML ~~LOC~~ SOLN
40.0000 mg | SUBCUTANEOUS | Status: DC
  Administered 2012-07-30 – 2012-08-02 (×4): 40 mg via SUBCUTANEOUS
  Filled 2012-07-29 (×5): qty 0.4

## 2012-07-29 MED ORDER — ACETAMINOPHEN 500 MG PO TABS
1000.0000 mg | ORAL_TABLET | Freq: Three times a day (TID) | ORAL | Status: DC
  Administered 2012-07-29 – 2012-08-02 (×12): 1000 mg via ORAL
  Filled 2012-07-29 (×14): qty 2

## 2012-07-29 MED ORDER — BUPIVACAINE HCL 0.25 % IJ SOLN
INTRAMUSCULAR | Status: DC | PRN
  Administered 2012-07-29: 9 mL

## 2012-07-29 SURGICAL SUPPLY — 47 items
BANDAGE ELASTIC 4 VELCRO ST LF (GAUZE/BANDAGES/DRESSINGS) ×2 IMPLANT
BANDAGE GAUZE ELAST BULKY 4 IN (GAUZE/BANDAGES/DRESSINGS) IMPLANT
BLADE SURG 10 STRL SS (BLADE) IMPLANT
BNDG COHESIVE 4X5 TAN STRL (GAUZE/BANDAGES/DRESSINGS) IMPLANT
BNDG COHESIVE 4X5 WHT NS (GAUZE/BANDAGES/DRESSINGS) ×2 IMPLANT
BNDG ELASTIC 6X15 VLCR STRL LF (GAUZE/BANDAGES/DRESSINGS) IMPLANT
CHLORAPREP W/TINT 26ML (MISCELLANEOUS) IMPLANT
CLOTH BEACON ORANGE TIMEOUT ST (SAFETY) ×2 IMPLANT
COVER SURGICAL LIGHT HANDLE (MISCELLANEOUS) ×2 IMPLANT
CUFF TOURNIQUET SINGLE 34IN LL (TOURNIQUET CUFF) IMPLANT
CUFF TOURNIQUET SINGLE 44IN (TOURNIQUET CUFF) IMPLANT
DRAPE SURG 17X23 STRL (DRAPES) IMPLANT
DRAPE U-SHAPE 47X51 STRL (DRAPES) IMPLANT
DRSG ADAPTIC 3X8 NADH LF (GAUZE/BANDAGES/DRESSINGS) IMPLANT
DRSG PAD ABDOMINAL 8X10 ST (GAUZE/BANDAGES/DRESSINGS) IMPLANT
ELECT REM PT RETURN 9FT ADLT (ELECTROSURGICAL)
ELECTRODE REM PT RTRN 9FT ADLT (ELECTROSURGICAL) IMPLANT
GLOVE BIO SURGEON STRL SZ7 (GLOVE) ×2 IMPLANT
GLOVE BIO SURGEON STRL SZ7.5 (GLOVE) ×2 IMPLANT
GLOVE BIOGEL PI IND STRL 8 (GLOVE) IMPLANT
GLOVE BIOGEL PI INDICATOR 8 (GLOVE)
GOWN PREVENTION PLUS LG XLONG (DISPOSABLE) ×2 IMPLANT
GOWN STRL NON-REIN LRG LVL3 (GOWN DISPOSABLE) IMPLANT
HANDPIECE INTERPULSE COAX TIP (DISPOSABLE)
IMMOBILIZER KNEE 24 THIGH 36 (MISCELLANEOUS) IMPLANT
IMMOBILIZER KNEE 24 UNIV (MISCELLANEOUS)
KIT BASIN OR (CUSTOM PROCEDURE TRAY) IMPLANT
KIT ROOM TURNOVER OR (KITS) ×2 IMPLANT
MANIFOLD NEPTUNE II (INSTRUMENTS) ×2 IMPLANT
NEEDLE 18GX1X1/2 (RX/OR ONLY) (NEEDLE) ×2 IMPLANT
NEEDLE HYPO 25GX1X1/2 BEV (NEEDLE) ×2 IMPLANT
NS IRRIG 1000ML POUR BTL (IV SOLUTION) IMPLANT
PACK ORTHO EXTREMITY (CUSTOM PROCEDURE TRAY) IMPLANT
PAD ARMBOARD 7.5X6 YLW CONV (MISCELLANEOUS) ×4 IMPLANT
SET HNDPC FAN SPRY TIP SCT (DISPOSABLE) IMPLANT
SOLUTION BETADINE 4OZ (MISCELLANEOUS) ×2 IMPLANT
SPONGE GAUZE 4X4 12PLY (GAUZE/BANDAGES/DRESSINGS) IMPLANT
SPONGE LAP 18X18 X RAY DECT (DISPOSABLE) IMPLANT
STAPLER VISISTAT 35W (STAPLE) IMPLANT
STOCKINETTE IMPERVIOUS 9X36 MD (GAUZE/BANDAGES/DRESSINGS) IMPLANT
SYR CONTROL 10ML LL (SYRINGE) ×6 IMPLANT
TOWEL OR 17X24 6PK STRL BLUE (TOWEL DISPOSABLE) ×2 IMPLANT
TOWEL OR 17X26 10 PK STRL BLUE (TOWEL DISPOSABLE) IMPLANT
TUBE ANAEROBIC SPECIMEN COL (MISCELLANEOUS) IMPLANT
TUBE CONNECTING 12X1/4 (SUCTIONS) IMPLANT
WATER STERILE IRR 1000ML POUR (IV SOLUTION) IMPLANT
YANKAUER SUCT BULB TIP NO VENT (SUCTIONS) IMPLANT

## 2012-07-29 NOTE — Progress Notes (Signed)
Patient ID: [name], female   DOB: Jun 05, 1961, 51 y.o.   MRN: [id number]    [facility]    Date of Admission:  07/26/2012     Active Problems:  HUMAN IMMUNODEFICIENCY VIRUS [HIV]  COPD  Right leg pain      . carvedilol  3.125 mg Oral BID WC  . clopidogrel  75 mg Oral Q breakfast  . elvitegravir-cobicistat-emtricitabine-tenofovir  1 tablet Oral Q breakfast  . enoxaparin (LOVENOX) injection  40 mg Subcutaneous Q24H  . febuxostat  80 mg Oral Daily  . gabapentin  300 mg Oral TID  . isosorbide mononitrate  60 mg Oral QPM  . lisinopril  2.5 mg Oral Daily  . multivitamin with minerals  1 tablet Oral Daily  . pantoprazole  20 mg Oral Daily  . traZODone  50 mg Oral QHS  . triamcinolone acetonide  40 mg Intramuscular To OR    Subjective: Her right leg pain is unchanged  Objective: Temp:  [98.2 F (36.8 C)-98.7 F (37.1 C)] 98.2 F (36.8 C) (11/23 0512) Pulse Rate:  [83-104] 83  (11/23 1037) Resp:  [18-20] 20  (11/23 0512) BP: (111-137)/(67-83) 131/77 mmHg (11/23 1037) SpO2:  [94 %-98 %] 96 % (11/23 0512)  General: She appears to be in no distress. She is currently on her way radiology for right ankle injection  Lab Results Lab Results  Component Value Date   WBC 5.3 07/29/2012   HGB 8.3* 07/29/2012   HCT 27.5* 07/29/2012   MCV 84.1 07/29/2012   PLT 243 07/29/2012    Lab Results  Component Value Date   CREATININE 0.64 07/29/2012   BUN 13 07/29/2012   NA 143 07/29/2012   K 3.9 07/29/2012   CL 108 07/29/2012   CO2 29 07/29/2012    Lab Results  Component Value Date   ALT 8 07/26/2012   AST 13 07/26/2012   ALKPHOS 47 07/26/2012   BILITOT 0.3 07/26/2012      HIV 1 RNA Quant (copies/mL)  Date Value  07/13/2012 <20   04/11/2012 <20   01/06/2012 916*     CD4 T Cell Abs (cmm)  Date Value  07/13/2012 450   04/11/2012 610   01/06/2012 550     Microbiology: Recent Results (from the past 240 hour(s))  MRSA PCR SCREENING     Status:  Normal   Collection Time   07/26/12  5:04 PM      Component Value Range Status Comment   MRSA by PCR NEGATIVE  NEGATIVE Final   SURGICAL PCR SCREEN     Status: Normal   Collection Time   07/29/12  2:37 AM      Component Value Range Status Comment   MRSA, PCR NEGATIVE  NEGATIVE Final    Staphylococcus aureus NEGATIVE  NEGATIVE Final     Studies/Results: Mr Foot Right W Wo Contrast  07/28/2012  *RADIOLOGY REPORT*  Clinical Data: Severe foot pain and swelling for 1 month.  MRI OF THE RIGHT FOREFOOT WITHOUT AND WITH CONTRAST  Technique:  Multiplanar, multisequence MR imaging was performed both before and after administration of intravenous contrast.  Contrast: 20mL MULTIHANCE GADOBENATE DIMEGLUMINE 529 MG/ML IV SOLN  Comparison: CT scan dated 06/09/2012  Findings: The patient has diffuse subcutaneous edema of the forefoot particularly on the dorsum of the foot. 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  After contrast administration there is abnormal enhancement of the joint spaces at  the fourth and fifth tarsometatarsal joints and at the first metatarsal phalangeal joint.  No appreciable fluid collections.  Findings  are consistent with synovitis of those joints.  No evidence of osteomyelitis.  No visible soft tissue abscesses.  IMPRESSION:  Synovitis at the fourth and fifth tarsometatarsal joints and the first metatarsal phalangeal joint.  Atypical infection should be considered.   Original Report Authenticated By: Francene Boyers, M.D.    Mr Ankle Right W Wo Contrast  07/28/2012  *RADIOLOGY REPORT*  Clinical Data:  Severe right ankle pain and swelling.  MRI OF THE RIGHT ANKLE WITH AND WITHOUT CONTRAST  Technique:  Multiplanar, multisequence MR imaging of the right ankle was performed before and after the administration of intravenous contrast.  Contrast:  20 ml Multihance  Comparison:  Radiographs dated 07/05/2012 and a CT scan dated  06/09/2012  Findings: There is prominent hypertrophy and edema of the synovium of the ankle joint end of the subtalar joint.  There is patchy edema in the distal tibia and fibula, talus, and calcaneus.  However, there are no discrete fluid collections in the ankle or subtalar joint.  There is also a soft tissue inflammation around the posterior tibialis and flexor digitorum longus and flexor hallucis longus tendons and adjacent to the peroneal tendons at the level of the ankle.  Comparing the osseous structures with the CT scan performed on 06/09/2012, there has been no bone destruction or progression of arthritic changes noted at that time.  The Achilles tendon and plantar fascia are normal.  The anterior tendons are normal.  IMPRESSION: Findings consistent with severe synovitis at the ankle joint and to a lesser degree at the subtalar joint with secondary inflammatory changes in the bones as described.  However, there is no bone destruction to suggest osteomyelitis there is no fluid in the joints to suggest septic joints.  Given the patient's history, the possibility of an atypical synovitis without pus should be considered. Biopsy of the ankle synovium may be useful.   Original Report Authenticated By: Francene Boyers, M.D.    Ir Fluoro Guide Cv Line Left  07/28/2012  *RADIOLOGY REPORT*  PICC PLACEMENT WITH ULTRASOUND AND FLUOROSCOPIC  GUIDANCE  Clinical History: Right foot infection  Fluoroscopy Time: 0.2 minutes.  Procedure:  The left arm was prepped with chlorhexidine, draped in the usual sterile fashion using maximum barrier technique (cap and mask, sterile gown, sterile gloves, large sterile sheet, hand hygiene and cutaneous antiseptic).  Local anesthesia was attained by infiltration with 1% lidocaine.  Ultrasound demonstrated patency of the eft basilic vein, and this was documented with an image.  Under real-time ultrasound guidance, this vein was accessed with a 21 gauge micropuncture needle and image  documentation was performed.  The needle was exchanged over a guidewire for a peel-away sheath through which a 33.5 cm 5 [place] dual lumen power injectable PICC was advanced, and positioned with its tip at the lower SVC/right atrial junction.  Fluoroscopy during the procedure and fluoro spot radiograph confirms appropriate catheter position.  The catheter was flushed, secured to the skin with Prolene sutures, and covered with a sterile dressing.  Complications:  None.  The patient tolerated the procedure well.  IMPRESSION: Successful placement of a left arm PICC with sonographic and fluoroscopic guidance.  The catheter is ready for use.  Signed,  Sterling Big, MD Vascular & Interventional Radiologist [facility]   Original Report Authenticated By: Malachy Moan, M.D.    Ir US Guide Vasc Access Left  07/28/2012  *  RADIOLOGY REPORT*  PICC PLACEMENT WITH ULTRASOUND AND FLUOROSCOPIC  GUIDANCE  Clinical History: Right foot infection  Fluoroscopy Time: 0.2 minutes.  Procedure:  The left arm was prepped with chlorhexidine, draped in the usual sterile fashion using maximum barrier technique (cap and mask, sterile gown, sterile gloves, large sterile sheet, hand hygiene and cutaneous antiseptic).  Local anesthesia was attained by infiltration with 1% lidocaine.  Ultrasound demonstrated patency of the eft basilic vein, and this was documented with an image.  Under real-time ultrasound guidance, this vein was accessed with a 21 gauge micropuncture needle and image documentation was performed.  The needle was exchanged over a guidewire for a peel-away sheath through which a 33.5 cm 5 [place] dual lumen power injectable PICC was advanced, and positioned with its tip at the lower SVC/right atrial junction.  Fluoroscopy during the procedure and fluoro spot radiograph confirms appropriate catheter position.  The catheter was flushed, secured to the skin with Prolene sutures, and covered with a sterile dressing.   Complications:  None.  The patient tolerated the procedure well.  IMPRESSION: Successful placement of a left arm PICC with sonographic and fluoroscopic guidance.  The catheter is ready for use.  Signed,  Sterling Big, MD Vascular & Interventional Radiologist [facility]   Original Report Authenticated By: Malachy Moan, M.D.     Assessment: The cause of her right ankle cellulitis remains unclear. There is no convincing evidence that this is due to smoldering infection. I agree with steroid injection. Her HIV infection remains under excellent control.  Plan: 1. Monitor results of right ankle steroid injection  Cliffton Asters, MD [facility] [phone] pager   [phone] cell 07/29/2012, 12:56 PM

## 2012-07-29 NOTE — Evaluation (Signed)
Physical Therapy Evaluation Patient Details Name: [name] MRN: [id number] DOB: Jun 29, 1961 Today's Date: 07/29/2012 Time: 1478-2956 PT Time Calculation (min): 20 min  PT Assessment / Plan / Recommendation Clinical Impression  Patient is a 51 yo female admitted with pain Rt. ankle/leg.  Patient s/p injection rt. ankle.  Patient will benefit from acute PT to maximize independence.  Recommend patient return to SNF for continued therapy.]    PT Assessment  Patient needs continued PT services    Follow Up Recommendations  SNF    Does the patient have the potential to tolerate intense rehabilitation      Barriers to Discharge Decreased caregiver support      Equipment Recommendations  None recommended by PT    Recommendations for Other Services     Frequency Min 3X/week    Precautions / Restrictions Precautions Precautions: Fall Restrictions Weight Bearing Restrictions: No Other Position/Activity Restrictions: No orders for WB restrictions.   Pertinent Vitals/Pain       Mobility  Bed Mobility Bed Mobility: Supine to Sit Supine to Sit: 5: Supervision;HOB flat Details for Bed Mobility Assistance: No cues or assist needed Transfers Transfers: Lateral/Scoot Transfers Lateral/Scoot Transfers: 4: Min guard;With armrests removed Details for Transfer Assistance: Patient performed scooting transfers from bed to [facility] and [facility] to bed with min guard assist.  Patient able to use UE's and LLE to assist.  Unable to put weight on RLE due to pain. Ambulation/Gait Ambulation/Gait Assistance: Not tested (comment)           PT Diagnosis: Difficulty walking;Generalized weakness;Acute pain  PT Problem List: Decreased strength;Decreased activity tolerance;Decreased mobility;Decreased knowledge of use of DME;Obesity;Pain PT Treatment Interventions: DME instruction;Gait training;Functional mobility training;Therapeutic activities;Patient/family education   PT Goals Acute Rehab PT  Goals PT Goal Formulation: With patient Time For Goal Achievement: 08/05/12 Potential to Achieve Goals: Good Pt will go Sit to Stand: with min assist;with upper extremity assist PT Goal: Sit to Stand - Progress: Goal set today Pt will go Stand to Sit: with min assist;with upper extremity assist PT Goal: Stand to Sit - Progress: Goal set today Pt will Transfer Bed to Chair/Chair to Bed: with modified independence PT Transfer Goal: Bed to Chair/Chair to Bed - Progress: Goal set today Pt will Ambulate: 16 - 50 feet;with mod assist;with rolling walker PT Goal: Ambulate - Progress: Goal set today  Visit Information  Last PT Received On: 07/29/12 Assistance Needed: +2    Subjective Data  Subjective: "The nurse gave me pain medicine and now I don't hurt so much" Patient Stated Goal: Walk some day.   Prior Functioning  Home Living Lives With: Other (Comment) (SNF) Available Help at Discharge: Skilled Nursing Facility Prior Function Level of Independence: Needs assistance Able to Take Stairs?: No Driving: No Comments: Patient has been using w/c at SNF. Communication Communication: No difficulties Dominant Hand: Right    Cognition  Overall Cognitive Status: Appears within functional limits for tasks assessed/performed Arousal/Alertness: Awake/alert Orientation Level: Appears intact for tasks assessed Behavior During Session: [facility] for tasks performed    Extremity/Trunk Assessment Right Upper Extremity Assessment RUE ROM/Strength/Tone: [facility] for tasks assessed Left Upper Extremity Assessment LUE ROM/Strength/Tone: Deficits LUE ROM/Strength/Tone Deficits: Strength 4/5 Right Lower Extremity Assessment RLE ROM/Strength/Tone: Deficits RLE ROM/Strength/Tone Deficits: Strength 3/5.  Ankle NT due to pain/surgery RLE Sensation: Deficits RLE Sensation Deficits: decreased to light touch (baseline per patient) Left Lower Extremity Assessment LLE ROM/Strength/Tone: [facility] for tasks assessed LLE  Sensation: WFL - Light Touch   Balance Balance  Balance Assessed: Yes Static Sitting Balance Static Sitting - Balance Support: No upper extremity supported;Feet supported Static Sitting - Level of Assistance: 7: Independent Static Sitting - Comment/# of Minutes: Patient able to sit unsupported > 10 minutes with good balance and upright posture.  End of Session PT - End of Session Equipment Utilized During Treatment: Gait belt Activity Tolerance: Patient limited by pain Patient left: in bed;with call bell/phone within reach (sitting on EOB) Nurse Communication: Mobility status  GP     Vena Austria 07/29/2012, 5:28 PM  Durenda Hurt. Renaldo Fiddler, [facility] Acute Rehab Services Pager [phone]

## 2012-07-29 NOTE — Progress Notes (Signed)
PATIENT DETAILS Name: [name] Age: 51 y.o. Sex: female Date of Birth: 1961/02/24 Admit Date: 07/26/2012 Admitting Physician Zannie Cove, MD ZOX:WRUEAV,WUJW, MD  Subjective: Continues to have pain and swelling over the Right foot and lower leg-for OR today  Assessment/Plan: Active Problems: RLE pain and swelling -MRI Foot and Ankle-not showing definite osteomyelitis, showing Synovitis and some bone edema, d/w Dr's Chandler and Comer-for OR tomorrow to drain synovial fluid if possible, and steroid injection -holding off on antibiotics till biopsy done-ID advising same as well. -doppler neg for DVT  HIV - last CD4 450 in 11/13 and Viral load undetectable, continue HAART   Anemia -suspect 2/2 chronic disease -claims to have occassional hemorrhoidal bleeds -H/H stable within past range-will check anemia panel and monitor for now  COPD -stable, PRN nebs  CAD/CHF (systolic) - stable, compensated, continue plavix/coreg -no obvious contra-indications for ACEI-start low dose Lisinopril -care with IVF to prevent vol overload -last Echo on 12/2011-EF of 25 to 30 %  HTN -controlled with Coreg, Imdur and now adding Lisinopril  H/o CVA -on Plavix and Statins  GERD -PPI  Disposition: Remain inpatient  DVT Prophylaxis: Prophylactic Lovenox   Code Status: Full code   Procedures:  PICC line-11/21  CONSULTS:  ID and Ortho  PHYSICAL EXAM: Vital signs in last 24 hours: Filed Vitals:   07/28/12 1821 07/28/12 2115 07/29/12 0512 07/29/12 0811  BP: 137/83 122/70 113/69 111/67  Pulse: 91 104 87 87  Temp:  98.7 F (37.1 C) 98.2 F (36.8 C)   TempSrc:  Oral Oral   Resp:  18 20   Height:      Weight:      SpO2:  94% 96%     Weight change:  Body mass index is 51.05 kg/(m^2).   Gen Exam: Awake and alert with clear speech.   Neck: Supple, No JVD.   Chest: B/L Clear.   CVS: S1 S2 Regular, no murmurs.  Abdomen: soft, BS +, non tender, non distended.    Extremities: no edema, lower extremities warm to touch.Right lower ext-swollen from dorsum of the foot to the lower half of the leg, mild erythema, and tender to palpation Neurologic: Non Focal.   Skin: No Rash.  Wounds: N/A.    Intake/Output from previous day:  Intake/Output Summary (Last 24 hours) at 07/29/12 1026 Last data filed at 07/29/12 0725  Gross per 24 hour  Intake      0 ml  Output    400 ml  Net   -400 ml     LAB RESULTS: CBC  Lab 07/29/12 0434 07/28/12 0500 07/27/12 0730 07/26/12 1651  WBC 5.3 5.1 4.5 5.4  HGB 8.3* 8.6* 8.4* 9.4*  HCT 27.5* 28.8* 27.5* 30.8*  PLT 243 258 245 257  MCV 84.1 83.7 82.1 83.9  MCH 25.4* 25.0* 25.1* 25.6*  MCHC 30.2 29.9* 30.5 30.5  RDW 15.0 15.0 14.9 14.8  LYMPHSABS -- -- -- 1.4  MONOABS -- -- -- 0.6  EOSABS -- -- -- 0.1  BASOSABS -- -- -- 0.0  BANDABS -- -- -- --    Chemistries   Lab 07/29/12 0434 07/28/12 0500 07/27/12 0730 07/26/12 1651  NA 143 137 139 138  K 3.9 3.8 3.3* 3.4*  CL 108 103 103 102  CO2 29 28 26 29   GLUCOSE 96 112* 112* 100*  BUN 13 11 10 9   CREATININE 0.64 0.66 0.66 0.82  CALCIUM 9.2 9.2 9.3 9.3  MG -- -- -- --  CBG: No results found for this basename: GLUCAP:5 in the last 168 hours  GFR Estimated Creatinine Clearance: 106 ml/min (by C-G formula based on Cr of 0.64).  Coagulation profile No results found for this basename: INR:5,PROTIME:5 in the last 168 hours  Cardiac Enzymes No results found for this basename: CK:3,CKMB:3,TROPONINI:3,MYOGLOBIN:3 in the last 168 hours  No components found with this basename: POCBNP:3 No results found for this basename: DDIMER:2 in the last 72 hours No results found for this basename: HGBA1C:2 in the last 72 hours No results found for this basename: CHOL:2,HDL:2,LDLCALC:2,TRIG:2,CHOLHDL:2,LDLDIRECT:2 in the last 72 hours No results found for this basename: TSH,T4TOTAL,FREET3,T3FREE,THYROIDAB in the last 72 hours  Basename 07/27/12 1530  VITAMINB12 368   FOLATE --  FERRITIN 31  TIBC 294  IRON 21*  RETICCTPCT 1.5   No results found for this basename: LIPASE:2,AMYLASE:2 in the last 72 hours  Urine Studies No results found for this basename: UACOL:2,UAPR:2,USPG:2,UPH:2,UTP:2,UGL:2,UKET:2,UBIL:2,UHGB:2,UNIT:2,UROB:2,ULEU:2,UEPI:2,UWBC:2,URBC:2,UBAC:2,CAST:2,CRYS:2,UCOM:2,BILUA:2 in the last 72 hours  MICROBIOLOGY: Recent Results (from the past 240 hour(s))  MRSA PCR SCREENING     Status: Normal   Collection Time   07/26/12  5:04 PM      Component Value Range Status Comment   MRSA by PCR NEGATIVE  NEGATIVE Final   SURGICAL PCR SCREEN     Status: Normal   Collection Time   07/29/12  2:37 AM      Component Value Range Status Comment   MRSA, PCR NEGATIVE  NEGATIVE Final    Staphylococcus aureus NEGATIVE  NEGATIVE Final     RADIOLOGY STUDIES/RESULTS: Dg Tibia/fibula Right  07/05/2012  *RADIOLOGY REPORT*  Clinical Data: Increase swelling and pain right lower extremity.  RIGHT TIBIA AND FIBULA - 2 VIEW  Comparison: 07/04/2012  Findings: Right tibia and fibula appear intact.  No malalignment or displaced fracture.  Degenerative changes of the ankle joint noted. Mild diffuse soft tissue swelling.  Patchy osteopenia noted at the ankle.  IMPRESSION: No acute osseous abnormality.  Right ankle degenerative changes and periarticular osteopenia.   Original Report Authenticated By: Judie Petit. Ruel Favors, M.D.    Dg Ankle 2 Views Right  07/04/2012  *RADIOLOGY REPORT*  Clinical Data: Pain post fall.  RIGHT ANKLE - 2 VIEW  Comparison: 06/09/2012  Findings: Calcaneal spur.  Ankle mortise intact. There has been some subchondral resorption suggesting disuse osteopenia. Ankle mortise intact. Negative for fracture, dislocation, or other acute abnormality.  Normal alignment. No significant degenerative change. Regional soft tissues unremarkable.  IMPRESSION:  No acute abnormality   Original Report Authenticated By: Osa Craver, M.D.    Dg Shoulder  Left  07/04/2012  *RADIOLOGY REPORT*  Clinical Data: Pain post fall.  LEFT SHOULDER - 2+ VIEW  Comparison: 02/14/2012  Findings: Negative for fracture, dislocation, or other acute abnormality.  Normal alignment and mineralization. No significant degenerative change.  Regional soft tissues unremarkable.  IMPRESSION:  Negative   Original Report Authenticated By: Osa Craver, M.D.    Dg Foot 2 Views Right  07/04/2012  *RADIOLOGY REPORT*  Clinical Data: Pain post fall  RIGHT FOOT - 2 VIEW  Comparison: 06/06/2012  Findings: Calcaneal spur at the plantar aponeurosis.  Diffuse soft tissue swelling most marked dorsally.  Negative for fracture, dislocation, or other acute bone injury.  Normal mineralization and alignment.  IMPRESSION:  1.  Negative for fracture or other acute bone injury. 2.  Dorsal soft tissue swelling. 3.  Calcaneal spur   Original Report Authenticated By: Osa Craver, M.D.  MEDICATIONS: Scheduled Meds:    . carvedilol  3.125 mg Oral BID WC  . clopidogrel  75 mg Oral Q breakfast  . elvitegravir-cobicistat-emtricitabine-tenofovir  1 tablet Oral Q breakfast  . enoxaparin (LOVENOX) injection  40 mg Subcutaneous Q24H  . febuxostat  80 mg Oral Daily  . gabapentin  300 mg Oral TID  . isosorbide mononitrate  60 mg Oral QPM  . lisinopril  2.5 mg Oral Daily  . multivitamin with minerals  1 tablet Oral Daily  . pantoprazole  20 mg Oral Daily  . traZODone  50 mg Oral QHS   Continuous Infusions:    . sodium chloride 20 mL/hr at 07/29/12 0511   PRN Meds:.acetaminophen, albuterol, bisacodyl, HYDROmorphone (DILAUDID) injection, HYDROmorphone, nitroGLYCERIN, ondansetron (ZOFRAN) IV, oxyCODONE, sodium chloride  Antibiotics: Anti-infectives     Start     Dose/Rate Route Frequency Ordered Stop   07/27/12 0800   elvitegravir-cobicistat-emtricitabine-tenofovir (STRIBILD) 150-150-200-300 MG tablet 1 tablet  Status:  Discontinued        1 tablet Oral Daily with  breakfast 07/26/12 1642 07/26/12 1703   07/27/12 0800   elvitegravir-cobicistat-emtricitabine-tenofovir (STRIBILD) 150-150-200-300 MG tablet 1 tablet        1 tablet Oral Daily with breakfast 07/26/12 1704             Jeoffrey Massed, MD  [facility] Pager:[phone]  If 7PM-7AM, please contact night-coverage [email or web address] Password [email or web address] 07/29/2012, 10:26 AM   LOS: 3 days

## 2012-07-29 NOTE — Op Note (Signed)
Procedure(s):  [name] female 50 y.o. 07/29/2012  Procedure(s)     Surgeon: Mable Paris   Anesthesia: Local anesthesia 0.25.% bupivacaine    Procedure Detail  Estimated Blood Loss:  Minimal         Drains: none  Blood Given: none         Specimens: none        Complications:  * No complications entered in OR log *         Disposition: PACU - hemodynamically stable.         Condition: stable    Procedure:   INDICATIONS FOR SURGERY: The patient is a 51 year old female with a history of HIV who has been in and out of the hospital over the last 6 weeks for right lower extremity pain and swelling. She was initially felt to have cellulitis which was treated with 2 courses of oral antibiotics. She presented to my office 3 days ago with increasing pain in the ankle as well as swelling. X-ray showed some changes of the bone the fibula and lateral tibia as well as a lateral talus.. She was readmitted to the hospital for MRI. I did attempt to aspirate the joint my office was unable to obtain any fluid.  The patient was evaluated by infectious disease team as well as the [facility] team who ultimately felt that based on the MRI and her clinical course this did not likely represent infection. Her MRI showed severe synovitis. After discussing her case we decided on taking her to the operating room for aspiration of the joint under x-ray guidance to ensure penetration of the joint and a steroid injection as long as there were no signs of infection based on aspiration.  OPERATIVE FINDINGS: The aspiration was dry. The ankle was injected anterolateral with a mixture of 1 mL, 40 mg Kenalog and 6 cc quarter percent Marcaine without epinephrine.  DESCRIPTION OF PROCEDURE: The patient was identified in preoperative  holding area where I personally marked the operative site after  verifying site, side, and procedure with the patient. She was taken back to the operating  room and placed supine on the operative table. The left anterior ankle was prepped with Betadine solution and infiltrated with about 3 cc of quarter percent Marcaine. X-ray guidance was then used to advance an 18-gauge needle into the anterolateral joint. A palpable puncture the joint capsule was appreciated. An aspiration was attempted but was dry. At this point a mixture of 40 mg Kenalog with 6 cc of quarter percent Marcaine without epinephrine was introduced into the joint without difficulty. The patient tolerated the procedure well and a light bandage was applied. At the time the patient was leaving the operating room she noted significant improvement in her pain. An Ace bandage was applied.

## 2012-07-29 NOTE — Progress Notes (Signed)
Pt arrived from procedure alert oriented, moving all ext.  NS to picc line @ 20cc/hr.  VS WNL.  Diet ordered.  Bonney Leitz RN

## 2012-07-30 LAB — BASIC METABOLIC PANEL
BUN: 14 mg/dL (ref 6–23)
CO2: 27 mEq/L (ref 19–32)
Chloride: 103 mEq/L (ref 96–112)
Glucose, Bld: 120 mg/dL — ABNORMAL HIGH (ref 70–99)
Potassium: 4.2 mEq/L (ref 3.5–5.1)

## 2012-07-30 LAB — CBC
HCT: 30.7 % — ABNORMAL LOW (ref 36.0–46.0)
Hemoglobin: 9.4 g/dL — ABNORMAL LOW (ref 12.0–15.0)
RBC: 3.69 MIL/uL — ABNORMAL LOW (ref 3.87–5.11)
WBC: 5.3 10*3/uL (ref 4.0–10.5)

## 2012-07-30 NOTE — Progress Notes (Signed)
PATIENT DETAILS Name: [name] Age: 51 y.o. Sex: female Date of Birth: [birth year] Admit Date: 07/26/2012 Admitting Physician Zannie Cove, MD ZOX:WRUEAV,WUJW, MD  Subjective: Continues to have pain but right foot and lower leg swelling much better  Assessment/Plan: Active Problems: RLE pain and swelling -MRI Foot and Ankle-not showing definite osteomyelitis, showing Synovitis and some bone edema-now s/p steroid injection-with clinical improvement-?role for a short burst of systemic steroids -holding off on antibiotics till biopsy done-ID advising same as well. -doppler neg for DVT  HIV - last CD4 450 in 11/13 and Viral load undetectable, continue HAART   Anemia -suspect 2/2 chronic disease -claims to have occassional hemorrhoidal bleeds -H/H stable within past range-will check anemia panel and monitor for now  COPD -stable, PRN nebs  CAD/CHF (systolic) - stable, compensated, continue plavix/coreg -no obvious contra-indications for ACEI-start low dose Lisinopril -care with IVF to prevent vol overload -last Echo on 12/2011-EF of 25 to 30 %  HTN -controlled with Coreg, Imdur and now adding Lisinopril  H/o CVA -on Plavix and Statins  GERD -PPI  Disposition: Remain inpatient  DVT Prophylaxis: Prophylactic Lovenox   Code Status: Full code   Procedures:  PICC line-11/21  CONSULTS:  ID and Ortho  PHYSICAL EXAM: Vital signs in last 24 hours: Filed Vitals:   07/29/12 1643 07/29/12 1816 07/29/12 2115 07/30/12 0510  BP: 120/61 119/67 148/81 146/84  Pulse: 86 85 82 76  Temp: 97.8 F (36.6 C) 98 F (36.7 C) 97.7 F (36.5 C) 98.1 F (36.7 C)  TempSrc: Oral Oral Oral Oral  Resp: 18 18 18 18   Height:      Weight:      SpO2: 98% 98% 97% 98%    Weight change:  Body mass index is 51.05 kg/(m^2).   Gen Exam: Awake and alert with clear speech.   Neck: Supple, No JVD.   Chest: B/L Clear.   CVS: S1 S2 Regular, no murmurs.  Abdomen: soft, BS +,  non tender, non distended.  Extremities: no edema, lower extremities warm to touch.Right lower ext-swollen from dorsum of the foot to the lower half of the leg, mild erythema, and tender to palpation-but significantly less than on admission following steroids injection Neurologic: Non Focal.   Skin: No Rash.  Wounds: N/A.    Intake/Output from previous day:  Intake/Output Summary (Last 24 hours) at 07/30/12 0856 Last data filed at 07/30/12 0849  Gross per 24 hour  Intake 1803.67 ml  Output    500 ml  Net 1303.67 ml     LAB RESULTS: CBC  Lab 07/29/12 0434 07/28/12 0500 07/27/12 0730 07/26/12 1651  WBC 5.3 5.1 4.5 5.4  HGB 8.3* 8.6* 8.4* 9.4*  HCT 27.5* 28.8* 27.5* 30.8*  PLT 243 258 245 257  MCV 84.1 83.7 82.1 83.9  MCH 25.4* 25.0* 25.1* 25.6*  MCHC 30.2 29.9* 30.5 30.5  RDW 15.0 15.0 14.9 14.8  LYMPHSABS -- -- -- 1.4  MONOABS -- -- -- 0.6  EOSABS -- -- -- 0.1  BASOSABS -- -- -- 0.0  BANDABS -- -- -- --    Chemistries   Lab 07/30/12 0640 07/29/12 0434 07/28/12 0500 07/27/12 0730 07/26/12 1651  NA 139 143 137 139 138  K 4.2 3.9 3.8 3.3* 3.4*  CL 103 108 103 103 102  CO2 27 29 28 26 29   GLUCOSE 120* 96 112* 112* 100*  BUN 14 13 11 10 9   CREATININE 0.66 0.64 0.66 0.66 0.82  CALCIUM 9.6 9.2 9.2 9.3  9.3  MG -- -- -- -- --    CBG:  Lab 07/29/12 1217  GLUCAP 81    GFR Estimated Creatinine Clearance: 106 ml/min (by C-G formula based on Cr of 0.66).  Coagulation profile No results found for this basename: INR:5,PROTIME:5 in the last 168 hours  Cardiac Enzymes No results found for this basename: CK:3,CKMB:3,TROPONINI:3,MYOGLOBIN:3 in the last 168 hours  No components found with this basename: POCBNP:3 No results found for this basename: DDIMER:2 in the last 72 hours No results found for this basename: HGBA1C:2 in the last 72 hours No results found for this basename: CHOL:2,HDL:2,LDLCALC:2,TRIG:2,CHOLHDL:2,LDLDIRECT:2 in the last 72 hours No results found for  this basename: TSH,T4TOTAL,FREET3,T3FREE,THYROIDAB in the last 72 hours  Basename 07/27/12 1530  VITAMINB12 368  FOLATE 12.7  FERRITIN 31  TIBC 294  IRON 21*  RETICCTPCT 1.5   No results found for this basename: LIPASE:2,AMYLASE:2 in the last 72 hours  Urine Studies No results found for this basename: UACOL:2,UAPR:2,USPG:2,UPH:2,UTP:2,UGL:2,UKET:2,UBIL:2,UHGB:2,UNIT:2,UROB:2,ULEU:2,UEPI:2,UWBC:2,URBC:2,UBAC:2,CAST:2,CRYS:2,UCOM:2,BILUA:2 in the last 72 hours  MICROBIOLOGY: Recent Results (from the past 240 hour(s))  MRSA PCR SCREENING     Status: Normal   Collection Time   07/26/12  5:04 PM      Component Value Range Status Comment   MRSA by PCR NEGATIVE  NEGATIVE Final   SURGICAL PCR SCREEN     Status: Normal   Collection Time   07/29/12  2:37 AM      Component Value Range Status Comment   MRSA, PCR NEGATIVE  NEGATIVE Final    Staphylococcus aureus NEGATIVE  NEGATIVE Final     RADIOLOGY STUDIES/RESULTS: Dg Tibia/fibula Right  07/05/2012  *RADIOLOGY REPORT*  Clinical Data: Increase swelling and pain right lower extremity.  RIGHT TIBIA AND FIBULA - 2 VIEW  Comparison: 07/04/2012  Findings: Right tibia and fibula appear intact.  No malalignment or displaced fracture.  Degenerative changes of the ankle joint noted. Mild diffuse soft tissue swelling.  Patchy osteopenia noted at the ankle.  IMPRESSION: No acute osseous abnormality.  Right ankle degenerative changes and periarticular osteopenia.   Original Report Authenticated By: Judie Petit. Ruel Favors, M.D.    Dg Ankle 2 Views Right  07/04/2012  *RADIOLOGY REPORT*  Clinical Data: Pain post fall.  RIGHT ANKLE - 2 VIEW  Comparison: 06/09/2012  Findings: Calcaneal spur.  Ankle mortise intact. There has been some subchondral resorption suggesting disuse osteopenia. Ankle mortise intact. Negative for fracture, dislocation, or other acute abnormality.  Normal alignment. No significant degenerative change. Regional soft tissues unremarkable.   IMPRESSION:  No acute abnormality   Original Report Authenticated By: Osa Craver, M.D.    Dg Shoulder Left  07/04/2012  *RADIOLOGY REPORT*  Clinical Data: Pain post fall.  LEFT SHOULDER - 2+ VIEW  Comparison: 02/14/2012  Findings: Negative for fracture, dislocation, or other acute abnormality.  Normal alignment and mineralization. No significant degenerative change.  Regional soft tissues unremarkable.  IMPRESSION:  Negative   Original Report Authenticated By: Osa Craver, M.D.    Dg Foot 2 Views Right  07/04/2012  *RADIOLOGY REPORT*  Clinical Data: Pain post fall  RIGHT FOOT - 2 VIEW  Comparison: 06/06/2012  Findings: Calcaneal spur at the plantar aponeurosis.  Diffuse soft tissue swelling most marked dorsally.  Negative for fracture, dislocation, or other acute bone injury.  Normal mineralization and alignment.  IMPRESSION:  1.  Negative for fracture or other acute bone injury. 2.  Dorsal soft tissue swelling. 3.  Calcaneal spur   Original Report Authenticated  By: D. DANIEL HASSELL III, M.D.     MEDICATIONS: Scheduled Meds:    . acetaminophen  1,000 mg Oral TID  . carvedilol  3.125 mg Oral BID WC  . clopidogrel  75 mg Oral Q breakfast  . elvitegravir-cobicistat-emtricitabine-tenofovir  1 tablet Oral Q breakfast  . enoxaparin (LOVENOX) injection  40 mg Subcutaneous Q24H  . febuxostat  80 mg Oral Daily  . gabapentin  300 mg Oral TID  . isosorbide mononitrate  60 mg Oral QPM  . lisinopril  2.5 mg Oral Daily  . multivitamin with minerals  1 tablet Oral Daily  . pantoprazole  20 mg Oral Daily  . polyethylene glycol  17 g Oral BID  . senna  2 tablet Oral BID  . traZODone  50 mg Oral QHS  . triamcinolone acetonide  40 mg Intramuscular To OR  . [DISCONTINUED] enoxaparin (LOVENOX) injection  40 mg Subcutaneous Q24H   Continuous Infusions:    . sodium chloride 20 mL/hr at 07/29/12 1914   PRN Meds:.acetaminophen, albuterol, bisacodyl, HYDROmorphone (DILAUDID)  injection, nitroGLYCERIN, ondansetron (ZOFRAN) IV, oxyCODONE, sodium chloride, [DISCONTINUED] bupivacaine, [DISCONTINUED] HYDROmorphone, [DISCONTINUED] triamcinolone acetonide  Antibiotics: Anti-infectives     Start     Dose/Rate Route Frequency Ordered Stop   07/27/12 0800   elvitegravir-cobicistat-emtricitabine-tenofovir (STRIBILD) 150-150-200-300 MG tablet 1 tablet  Status:  Discontinued        1 tablet Oral Daily with breakfast 07/26/12 1642 07/26/12 1703   07/27/12 0800   elvitegravir-cobicistat-emtricitabine-tenofovir (STRIBILD) 150-150-200-300 MG tablet 1 tablet        1 tablet Oral Daily with breakfast 07/26/12 1704             Jeoffrey Massed, MD  [facility] Pager:[phone]  If 7PM-7AM, please contact night-coverage [email or web address] Password [email or web address] 07/30/2012, 8:56 AM   LOS: 4 days

## 2012-07-30 NOTE — Progress Notes (Signed)
Orthopedic Tech Progress Note Patient Details:  [name] 1961-03-14 [id number] CAM walker fitted to Right Le with instructions; tolerated well.  Ortho Devices Type of Ortho Device: CAM walker Ortho Device/Splint Location: Right LE Ortho Device/Splint Interventions: Application   Asia R Thompson 07/30/2012, 12:29 PM

## 2012-07-30 NOTE — Progress Notes (Signed)
Patient ID: [name], female   DOB: 1961-02-20, 51 y.o.   MRN: [id number]    [facility]    Date of Admission:  07/26/2012     Active Problems:  HUMAN IMMUNODEFICIENCY VIRUS [HIV]  COPD  Right leg pain      . acetaminophen  1,000 mg Oral TID  . carvedilol  3.125 mg Oral BID WC  . clopidogrel  75 mg Oral Q breakfast  . elvitegravir-cobicistat-emtricitabine-tenofovir  1 tablet Oral Q breakfast  . enoxaparin (LOVENOX) injection  40 mg Subcutaneous Q24H  . febuxostat  80 mg Oral Daily  . gabapentin  300 mg Oral TID  . isosorbide mononitrate  60 mg Oral QPM  . lisinopril  2.5 mg Oral Daily  . multivitamin with minerals  1 tablet Oral Daily  . pantoprazole  20 mg Oral Daily  . polyethylene glycol  17 g Oral BID  . senna  2 tablet Oral BID  . traZODone  50 mg Oral QHS  . triamcinolone acetonide  40 mg Intramuscular To OR  . [DISCONTINUED] enoxaparin (LOVENOX) injection  40 mg Subcutaneous Q24H    Subjective: She is feeling much better. The pain in her right ankle is markedly improved from 10 yesterday to about 5 currently.  Objective: Temp:  [97.7 F (36.5 C)-98.3 F (36.8 C)] 98.1 F (36.7 C) (11/24 0510) Pulse Rate:  [76-86] 76  (11/24 0510) Resp:  [18] 18  (11/24 0510) BP: (119-148)/(61-84) 146/84 mmHg (11/24 0510) SpO2:  [97 %-100 %] 98 % (11/24 0510) FiO2 (%):  [21 %] 21 % (11/23 1327)  General: She is in good spirits Right leg swelling has decreased  Studies/Results: Dg Fluoro Guide Ndl Plc/bx  07/29/2012  *RADIOLOGY REPORT*  Clinical Data: Right ankle joint injection for synovitis.  FLUORO GUIDED NEEDLE PLACEMENT  Fluoroscopy was provided intraoperatively for Dr. Ave Filter.  Single spot image shows placement of a needle into the lateral right ankle joint.   Original Report Authenticated By: Irish Lack, M.D.     Assessment: She had a prompt and excellent response to the Kenalog injection in her right ankle  yesterday.  Plan: 1. Continue her current antiretroviral therapy 2. She has a followup appointment in our clinic for ongoing management of her HIV infection 3. Please call if we can assist any further while she is here  Cliffton Asters, MD [facility] for Infectious Disease [facility] [phone] pager   [phone] cell 07/30/2012, 12:25 PM

## 2012-07-30 NOTE — Progress Notes (Signed)
PATIENT ID: [name]   1 Day Post-Op Procedure(s) (LRB): IRRIGATION AND DEBRIDEMENT EXTREMITY (Right)  Subjective: Feeling much better today.  Feels pain and swelling is decreased.  Still not able bear weight well.  Objective:  Filed Vitals:   07/30/12 0510  BP: 146/84  Pulse: 76  Temp: 98.1 F (36.7 C)  Resp: 18     RLE ACE intact.  Wiggles toes, still difficult to wiggle ankle. NVID  Labs:   [facility] 07/29/12 0434 07/28/12 0500  HGB 8.3* 8.6*   Basename 07/29/12 0434 07/28/12 0500  WBC 5.3 5.1  RBC 3.27* 3.44*  HCT 27.5* 28.8*  PLT 243 258   Basename 07/30/12 0640 07/29/12 0434  NA 139 143  K 4.2 3.9  CL 103 108  CO2 27 29  BUN 14 13  CREATININE 0.66 0.64  GLUCOSE 120* 96  CALCIUM 9.6 9.2    Assessment and Plan:Doing better Will get her a fracture boot to provide support/stabilization to help mobilize.  Ok to be WBAT.  Can be out of boot when not ambulating.

## 2012-07-30 NOTE — Clinical Social Work Note (Signed)
CSW requested to send PT evaluation to [facility] for insurance approval. CSW faxed PT evaluation. Weekday CSW will follow up for d/c planning.  Lia Foyer, LCSWA [facility] Clinical Social Worker Contact #: [phone] (weekend)

## 2012-07-31 ENCOUNTER — Encounter (HOSPITAL_COMMUNITY): Payer: Self-pay | Admitting: *Deleted

## 2012-07-31 DIAGNOSIS — I2510 Atherosclerotic heart disease of native coronary artery without angina pectoris: Secondary | ICD-10-CM

## 2012-07-31 DIAGNOSIS — M79609 Pain in unspecified limb: Secondary | ICD-10-CM

## 2012-07-31 DIAGNOSIS — I509 Heart failure, unspecified: Secondary | ICD-10-CM

## 2012-07-31 DIAGNOSIS — I5022 Chronic systolic (congestive) heart failure: Secondary | ICD-10-CM

## 2012-07-31 DIAGNOSIS — I6350 Cerebral infarction due to unspecified occlusion or stenosis of unspecified cerebral artery: Secondary | ICD-10-CM

## 2012-07-31 LAB — TROPONIN I: Troponin I: <0.3 ng/mL (ref <0.30)

## 2012-07-31 MED ORDER — NITROGLYCERIN 0.4 MG SL SUBL
SUBLINGUAL_TABLET | SUBLINGUAL | Status: AC
  Filled 2012-07-31: qty 25

## 2012-07-31 MED ORDER — PANTOPRAZOLE SODIUM 40 MG PO TBEC
40.0000 mg | DELAYED_RELEASE_TABLET | Freq: Every day | ORAL | Status: DC
  Administered 2012-08-01 – 2012-08-02 (×2): 40 mg via ORAL
  Filled 2012-07-31 (×3): qty 1

## 2012-07-31 MED ORDER — ROSUVASTATIN CALCIUM 5 MG PO TABS: 5.0000 mg | ORAL_TABLET | Freq: Every evening | ORAL | Status: DC

## 2012-07-31 MED ORDER — LISINOPRIL 2.5 MG PO TABS: 2.5000 mg | ORAL_TABLET | Freq: Every day | ORAL | Status: DC

## 2012-07-31 MED ORDER — ACETAMINOPHEN 500 MG PO TABS: 1000.0000 mg | ORAL_TABLET | Freq: Three times a day (TID) | ORAL | Status: DC

## 2012-07-31 MED ORDER — SENNA 8.6 MG PO TABS: 2.0000 | ORAL_TABLET | Freq: Every day | ORAL | Status: DC

## 2012-07-31 MED ORDER — OXYCODONE-ACETAMINOPHEN 5-325 MG PO TABS: 1.0000 | ORAL_TABLET | Freq: Four times a day (QID) | ORAL | Status: DC | PRN

## 2012-07-31 MED ORDER — ASPIRIN 81 MG PO CHEW
81.0000 mg | CHEWABLE_TABLET | Freq: Every day | ORAL | Status: DC
  Administered 2012-07-31 – 2012-08-02 (×3): 81 mg via ORAL
  Filled 2012-07-31 (×4): qty 1

## 2012-07-31 MED ORDER — NITROGLYCERIN 0.4 MG SL SUBL
SUBLINGUAL_TABLET | SUBLINGUAL | Status: AC
  Administered 2012-07-31: 0.3 mg via SUBLINGUAL
  Filled 2012-07-31: qty 25

## 2012-07-31 MED ORDER — POLYETHYLENE GLYCOL 3350 17 G PO PACK: 17.0000 g | PACK | Freq: Every day | ORAL | Status: DC

## 2012-07-31 NOTE — Progress Notes (Signed)
PATIENT ID: Adela Glimpse Staller   2 Days Post-Op Procedure(s) (LRB): IRRIGATION AND DEBRIDEMENT EXTREMITY (Right)  Subjective: Feeling really good today. Reports pain is under control with 1-2 Percocet. Mild pain at rest. Was given CAM walker boot yesterday and stated she could take > 4 steps with only mild discomfort of her R ankle. Wants to work with PT again today with boot on. After that, ready to go back to SNF. No complaints or concerns.  Objective:  Filed Vitals:   07/31/12 0511  BP: 149/92  Pulse: 81  Temp: 98.5 F (36.9 C)  Resp: 18     RLE with mild swelling, no erythema, warmth Mild discomfort to palpation over lateral aspect of ankle Near full ROM of ankle, wiggles toes, NVIID ACE reapplied and intact   Labs:   [facility] 07/30/12 0640 07/29/12 0434  HGB 9.4* 8.3*   Basename 07/30/12 0640 07/29/12 0434  WBC 5.3 5.3  RBC 3.69* 3.27*  HCT 30.7* 27.5*  PLT 297 243   Basename 07/30/12 0640 07/29/12 0434  NA 139 143  K 4.2 3.9  CL 103 108  CO2 27 29  BUN 14 13  CREATININE 0.66 0.64  GLUCOSE 120* 96  CALCIUM 9.6 9.2    Assessment and Plan: Pain and function is much better after corticosteroid injection Will get up with PT today again Okay to d/c from orthopedic standpoint when medically stable CAM walker boot to assist with ambulation, can be out of it when not ambulating WBAT Fu in 2 weeks with Dr. Ave Filter Script for Percocet 5-325 mg 1-2 po q 6 hrs prn pain in chart  VTE proph: SCDs, lovenox

## 2012-07-31 NOTE — Progress Notes (Signed)
Physical Therapy Treatment Patient Details Name: [name] MRN: [id number] DOB: 1960-11-17 Today's Date: 07/31/2012 Time: 4696-2952 PT Time Calculation (min): 13 min  PT Assessment / Plan / Recommendation Comments on Treatment Session  Pt adm with rt ankle/leg pain.  Pt had injection in t ankle.  Pt now with CAM boot and able to tolerate some wt bearing.  Pt now c/o rt. knee pain.    Follow Up Recommendations  SNF     Does the patient have the potential to tolerate intense rehabilitation     Barriers to Discharge        Equipment Recommendations  None recommended by PT    Recommendations for Other Services    Frequency Min 3X/week   Plan Discharge plan remains appropriate;Frequency remains appropriate    Precautions / Restrictions Precautions Precautions: Fall Required Braces or Orthoses: Other Brace/Splint Other Brace/Splint: CAM boot when amb Restrictions RLE Weight Bearing: Weight bearing as tolerated   Pertinent Vitals/Pain Pain in rt knee.    Mobility  Bed Mobility Bed Mobility: Sit to Supine;Sitting - Scoot to Edge of Bed Supine to Sit: 6: Modified independent (Device/Increase time);HOB elevated Sitting - Scoot to Edge of Bed: 6: Modified independent (Device/Increase time) Sit to Supine: 7: Independent;HOB flat Transfers Transfers: Sit to Stand;Stand to Sit Sit to Stand: 1: +2 Total assist;With upper extremity assist;With armrests;From chair/3-in-1 Sit to Stand: Patient Percentage: 60% Stand to Sit: 1: +2 Total assist;To chair/3-in-1;With upper extremity assist Stand to Sit: Patient Percentage: 70% Lateral/Scoot Transfers: 4: Min guard;With armrests removed Details for Transfer Assistance: Performed scooting transfer from bed to [facility] and [facility] to bed.  Pt stood x 2 from [facility].  Cues to stand more erect.  Pt with flexed trunk. Ambulation/Gait Ambulation/Gait Assistance: 1: +2 Total assist Ambulation/Gait: Patient Percentage: 70% Ambulation Distance (Feet): 2  Feet (4 steps) Assistive device: Rolling walker Ambulation/Gait Assistance Details: verbal cues to stand more erect Gait Pattern: Step-to pattern;Decreased step length - left;Decreased stance time - right;Trunk flexed Gait velocity: decr    Exercises     PT Diagnosis:    PT Problem List:   PT Treatment Interventions:     PT Goals Acute Rehab PT Goals PT Goal: Sit to Stand - Progress: Progressing toward goal PT Goal: Stand to Sit - Progress: Progressing toward goal PT Transfer Goal: Bed to Chair/Chair to Bed - Progress: Progressing toward goal PT Goal: Ambulate - Progress: Progressing toward goal  Visit Information  Last PT Received On: 07/31/12 Assistance Needed: +2    Subjective Data  Subjective: "I took 4 baby steps yesterday."   Cognition  Overall Cognitive Status: Appears within functional limits for tasks assessed/performed Arousal/Alertness: Awake/alert Orientation Level: Appears intact for tasks assessed Behavior During Session: [facility] for tasks performed    Balance  Static Standing Balance Static Standing - Balance Support: Bilateral upper extremity supported (on walker) Static Standing - Level of Assistance: 4: Min assist  End of Session PT - End of Session Activity Tolerance: Patient limited by pain Patient left: in bed;with call bell/phone within reach   GP     [facility] 07/31/2012, 10:06 AM  [facility] PT [phone]
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
## 2012-07-31 NOTE — Progress Notes (Signed)
Still awaiting [employer] Medicare auth for SNF return today. I have spoken with insurance rep who is helping to expedite this.   Reece Levy, MSW, Theresia Majors [phone]

## 2012-07-31 NOTE — Discharge Summary (Addendum)
PATIENT DETAILS Name: [name] Age: 51 y.o. Sex: female Date of Birth: 1960/09/12 MRN: [id number]. Admit Date: 07/26/2012 Admitting Physician: Zannie Cove, MD JYN:WGNFAO,ZHYQ, MD  Recommendations for Outpatient Follow-up:  1. Monitor CBC periodically, if persistently anemic-will need further outpatient work up  PRIMARY DISCHARGE DIAGNOSIS:  Active Problems:  HUMAN IMMUNODEFICIENCY VIRUS [HIV]  COPD  Right leg pain Chest Pain      PAST MEDICAL HISTORY: Past Medical History  Diagnosis Date  . Cholecystitis     Gall bladder removed   . Hypercholesterolemia   . Fibroids   . HIV (human immunodeficiency virus infection) 05/27/11  . Pelvic pain in female 10/14/11  . PMB (postmenopausal bleeding)     Since 2010  . Increased BMI 05/27/11  . Anxiety   . Depression   . Hypertension   . H/O varicella   . History of measles, mumps, or rubella   . Blood transfusion   . Hypothyroidism   . CHF (congestive heart failure)   . Myocardial infarction 07/2004  . COPD (chronic obstructive pulmonary disease)   . Pneumonia 1980's    "once"  . Iron deficiency anemia   . Lower GI bleed 04/24/2012    recurrent; "last episode 03/2012)  . Stroke 12/2011    residual "speech messes up when I get sick or real tired" (04/24/2012)  . Arthritis     "knees"    DISCHARGE MEDICATIONS:   Medication List     As of 08/01/2012  8:57 AM    STOP taking these medications         HYDROmorphone 2 MG tablet   Commonly known as: DILAUDID      TAKE these medications         acetaminophen 500 MG tablet   Commonly known as: TYLENOL   Take 2 tablets (1,000 mg total) by mouth 3 (three) times daily.      albuterol 108 (90 BASE) MCG/ACT inhaler   Commonly known as: PROVENTIL HFA;VENTOLIN HFA   Inhale 2 puffs into the lungs every 6 (six) hours as needed. For wheezing and shortness of breath      albuterol (5 MG/ML) 0.5% nebulizer solution   Commonly known as: PROVENTIL   Take 2.5 mg by  nebulization every 2 (two) hours as needed. For shortness of breath      aspirin 81 MG EC tablet   Take 1 tablet (81 mg total) by mouth daily. Swallow whole.      bisacodyl 5 MG EC tablet   Commonly known as: DULCOLAX   Take 10 mg by mouth daily as needed. For constipation      carvedilol 3.125 MG tablet   Commonly known as: COREG   Take 3.125 mg by mouth 2 (two) times daily with a meal. 9am and 6pm      clopidogrel 75 MG tablet   Commonly known as: PLAVIX   Take 75 mg by mouth daily.      diphenoxylate-atropine 2.5-0.025 MG per tablet   Commonly known as: LOMOTIL   Take 1 tablet by mouth 4 (four) times daily as needed. For loose stool      elvitegravir-cobicistat-emtricitabine-tenofovir 150-150-200-300 MG Tabs   Commonly known as: STRIBILD   Take 1 tablet by mouth daily with breakfast.      febuxostat 40 MG tablet   Commonly known as: ULORIC   Take 80 mg by mouth daily.      gabapentin 300 MG capsule   Commonly known as: NEURONTIN   Take  300 mg by mouth 3 (three) times daily.      isosorbide mononitrate 60 MG 24 hr tablet   Commonly known as: IMDUR   Take 60 mg by mouth every evening. 5pm      lisinopril 2.5 MG tablet   Commonly known as: PRINIVIL,ZESTRIL   Take 1 tablet (2.5 mg total) by mouth daily.      multivitamin with minerals Tabs   Take 1 tablet by mouth daily.      nitroGLYCERIN 0.3 MG SL tablet   Commonly known as: NITROSTAT   Place 0.3 mg under the tongue every 5 (five) minutes as needed. For chest pain      oxyCODONE-acetaminophen 5-325 MG per tablet   Commonly known as: PERCOCET/ROXICET   Take 1-2 tablets by mouth every 6 (six) hours as needed for pain.      pantoprazole 20 MG tablet   Commonly known as: PROTONIX   Take 20 mg by mouth daily.      polyethylene glycol packet   Commonly known as: MIRALAX / GLYCOLAX   Take 17 g by mouth daily.      rosuvastatin 5 MG tablet   Commonly known as: CRESTOR   Take 1 tablet (5 mg total) by mouth every  evening. 5pm      senna 8.6 MG Tabs   Commonly known as: SENOKOT   Take 2 tablets (17.2 mg total) by mouth at bedtime.      traZODone 50 MG tablet   Commonly known as: DESYREL   Take 50 mg by mouth at bedtime.      valACYclovir 1000 MG tablet   Commonly known as: VALTREX   Take 1,000 mg by mouth Once daily as needed. For flare ups          BRIEF HPI:  See H&P, Labs, Consult and Test reports for all details in brief, [name] is a 51/F with PMH of Systolic CHF, COPD, HIV on HAART, has had issues with R lower extremity and foot pain and swelling since early October, she has been treated for cellulitis on 2 separate occasions in October, initially with IV vancomycin and then 2 separate courses of Clindamycin upon discharge 10/10 and Bactrim DS when discharged 10/31 for 5 days, since there wasn't much improvement in the pain and swelling she was sent for Ortho FU today was seen by Dr.Chandler where Xray of the foot/ankle was concerning for osteomyelitis and sent to hospital as a direct admit.  CONSULTATIONS:   ID Ortho  PERTINENT RADIOLOGIC STUDIES: Dg Tibia/fibula Right  07/05/2012  *RADIOLOGY REPORT*  Clinical Data: Increase swelling and pain right lower extremity.  RIGHT TIBIA AND FIBULA - 2 VIEW  Comparison: 07/04/2012  Findings: Right tibia and fibula appear intact.  No malalignment or displaced fracture.  Degenerative changes of the ankle joint noted. Mild diffuse soft tissue swelling.  Patchy osteopenia noted at the ankle.  IMPRESSION: No acute osseous abnormality.  Right ankle degenerative changes and periarticular osteopenia.   Original Report Authenticated By: Judie Petit. Ruel Favors, M.D.    Dg Ankle 2 Views Right  07/04/2012  *RADIOLOGY REPORT*  Clinical Data: Pain post fall.  RIGHT ANKLE - 2 VIEW  Comparison: 06/09/2012  Findings: Calcaneal spur.  Ankle mortise intact. There has been some subchondral resorption suggesting disuse osteopenia. Ankle mortise intact. Negative for  fracture, dislocation, or other acute abnormality.  Normal alignment. No significant degenerative change. Regional soft tissues unremarkable.  IMPRESSION:  No acute abnormality   Original Report Authenticated  By: D. DANIEL HASSELL III, M.D.    Mr Foot Right W Wo Contrast  07/28/2012  *RADIOLOGY REPORT*  Clinical Data: Severe foot pain and swelling for 1 month.  MRI OF THE RIGHT FOREFOOT WITHOUT AND WITH CONTRAST  Technique:  Multiplanar, multisequence MR imaging was performed both before and after administration of intravenous contrast.  Contrast: 20mL MULTIHANCE GADOBENATE DIMEGLUMINE 529 MG/ML IV SOLN  Comparison: CT scan dated 06/09/2012  Findings: The patient has diffuse subcutaneous edema of the forefoot particularly on the dorsum of the foot. 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  After contrast administration there is abnormal enhancement of the joint spaces at the fourth and fifth tarsometatarsal joints and at the first metatarsal phalangeal joint.  No appreciable fluid collections.  Findings  are consistent with synovitis of those joints.  No evidence of osteomyelitis.  No visible soft tissue abscesses.  IMPRESSION:  Synovitis at the fourth and fifth tarsometatarsal joints and the first metatarsal phalangeal joint.  Atypical infection should be considered.   Original Report Authenticated By: Francene Boyers, M.D.    Mr Ankle Right W Wo Contrast  07/28/2012  *RADIOLOGY REPORT*  Clinical Data:  Severe right ankle pain and swelling.  MRI OF THE RIGHT ANKLE WITH AND WITHOUT CONTRAST  Technique:  Multiplanar, multisequence MR imaging of the right ankle was performed before and after the administration of intravenous contrast.  Contrast:  20 ml Multihance  Comparison:  Radiographs dated 07/05/2012 and a CT scan dated 06/09/2012  Findings: There is prominent hypertrophy and edema of the synovium of the ankle joint end of the subtalar joint.  There  is patchy edema in the distal tibia and fibula, talus, and calcaneus.  However, there are no discrete fluid collections in the ankle or subtalar joint.  There is also a soft tissue inflammation around the posterior tibialis and flexor digitorum longus and flexor hallucis longus tendons and adjacent to the peroneal tendons at the level of the ankle.  Comparing the osseous structures with the CT scan performed on 06/09/2012, there has been no bone destruction or progression of arthritic changes noted at that time.  The Achilles tendon and plantar fascia are normal.  The anterior tendons are normal.  IMPRESSION: Findings consistent with severe synovitis at the ankle joint and to a lesser degree at the subtalar joint with secondary inflammatory changes in the bones as described.  However, there is no bone destruction to suggest osteomyelitis there is no fluid in the joints to suggest septic joints.  Given the patient's history, the possibility of an atypical synovitis without pus should be considered. Biopsy of the ankle synovium may be useful.   Original Report Authenticated By: Francene Boyers, M.D.    Ir Fluoro Guide Cv Line Left  07/28/2012  *RADIOLOGY REPORT*  PICC PLACEMENT WITH ULTRASOUND AND FLUOROSCOPIC  GUIDANCE  Clinical History: Right foot infection  Fluoroscopy Time: 0.2 minutes.  Procedure:  The left arm was prepped with chlorhexidine, draped in the usual sterile fashion using maximum barrier technique (cap and mask, sterile gown, sterile gloves, large sterile sheet, hand hygiene and cutaneous antiseptic).  Local anesthesia was attained by infiltration with 1% lidocaine.  Ultrasound demonstrated patency of the eft basilic vein, and this was documented with an image.  Under real-time ultrasound guidance, this vein was accessed with a 21 gauge micropuncture needle and image documentation was performed.  The needle was exchanged over a guidewire for a peel-away sheath through which a  33.5 cm 5 [place] dual  lumen power injectable PICC was advanced, and positioned with its tip at the lower SVC/right atrial junction.  Fluoroscopy during the procedure and fluoro spot radiograph confirms appropriate catheter position.  The catheter was flushed, secured to the skin with Prolene sutures, and covered with a sterile dressing.  Complications:  None.  The patient tolerated the procedure well.  IMPRESSION: Successful placement of a left arm PICC with sonographic and fluoroscopic guidance.  The catheter is ready for use.  Signed,  Sterling Big, MD Vascular & Interventional Radiologist [facility] Radiology   Original Report Authenticated By: Malachy Moan, M.D.    Ir US Guide Vasc Access Left  07/28/2012  *RADIOLOGY REPORT*  PICC PLACEMENT WITH ULTRASOUND AND FLUOROSCOPIC  GUIDANCE  Clinical History: Right foot infection  Fluoroscopy Time: 0.2 minutes.  Procedure:  The left arm was prepped with chlorhexidine, draped in the usual sterile fashion using maximum barrier technique (cap and mask, sterile gown, sterile gloves, large sterile sheet, hand hygiene and cutaneous antiseptic).  Local anesthesia was attained by infiltration with 1% lidocaine.  Ultrasound demonstrated patency of the eft basilic vein, and this was documented with an image.  Under real-time ultrasound guidance, this vein was accessed with a 21 gauge micropuncture needle and image documentation was performed.  The needle was exchanged over a guidewire for a peel-away sheath through which a 33.5 cm 5 [place] dual lumen power injectable PICC was advanced, and positioned with its tip at the lower SVC/right atrial junction.  Fluoroscopy during the procedure and fluoro spot radiograph confirms appropriate catheter position.  The catheter was flushed, secured to the skin with Prolene sutures, and covered with a sterile dressing.  Complications:  None.  The patient tolerated the procedure well.  IMPRESSION: Successful placement of a left arm PICC with  sonographic and fluoroscopic guidance.  The catheter is ready for use.  Signed,  Sterling Big, MD Vascular & Interventional Radiologist [facility] Radiology   Original Report Authenticated By: Malachy Moan, M.D.    Dg Shoulder Left  07/04/2012  *RADIOLOGY REPORT*  Clinical Data: Pain post fall.  LEFT SHOULDER - 2+ VIEW  Comparison: 02/14/2012  Findings: Negative for fracture, dislocation, or other acute abnormality.  Normal alignment and mineralization. No significant degenerative change.  Regional soft tissues unremarkable.  IMPRESSION:  Negative   Original Report Authenticated By: Osa Craver, M.D.    Dg Foot 2 Views Right  07/04/2012  *RADIOLOGY REPORT*  Clinical Data: Pain post fall  RIGHT FOOT - 2 VIEW  Comparison: 06/06/2012  Findings: Calcaneal spur at the plantar aponeurosis.  Diffuse soft tissue swelling most marked dorsally.  Negative for fracture, dislocation, or other acute bone injury.  Normal mineralization and alignment.  IMPRESSION:  1.  Negative for fracture or other acute bone injury. 2.  Dorsal soft tissue swelling. 3.  Calcaneal spur   Original Report Authenticated By: Osa Craver, M.D.    Dg Fluoro Guide Ndl Plc/bx  07/29/2012  *RADIOLOGY REPORT*  Clinical Data: Right ankle joint injection for synovitis.  FLUORO GUIDED NEEDLE PLACEMENT  Fluoroscopy was provided intraoperatively for Dr. Ave Filter.  Single spot image shows placement of a needle into the lateral right ankle joint.   Original Report Authenticated By: Irish Lack, M.D.      PERTINENT LAB RESULTS: CBC:  Basename 07/30/12 0640  WBC 5.3  HGB 9.4*  HCT 30.7*  PLT 297   CMET CMP     Component Value Date/Time  NA 139 07/30/2012 0640   K 4.2 07/30/2012 0640   CL 103 07/30/2012 0640   CO2 27 07/30/2012 0640   GLUCOSE 120* 07/30/2012 0640   BUN 14 07/30/2012 0640   CREATININE 0.66 07/30/2012 0640   CREATININE 0.86 07/13/2012 1450   CALCIUM 9.6 07/30/2012 0640   PROT 7.4  07/26/2012 1651   ALBUMIN 3.2* 07/26/2012 1651   AST 13 07/26/2012 1651   ALT 8 07/26/2012 1651   ALKPHOS 47 07/26/2012 1651   BILITOT 0.3 07/26/2012 1651   GFRNONAA >90 07/30/2012 0640   GFRAA >90 07/30/2012 0640    GFR Estimated Creatinine Clearance: 106 ml/min (by C-G formula based on Cr of 0.66). No results found for this basename: LIPASE:2,AMYLASE:2 in the last 72 hours  Basename 08/01/12 0540 07/31/12 2225 07/31/12 1629  CKTOTAL -- -- --  CKMB -- -- --  CKMBINDEX -- -- --  TROPONINI <0.30 <0.30 <0.30   No components found with this basename: POCBNP:3 No results found for this basename: DDIMER:2 in the last 72 hours No results found for this basename: HGBA1C:2 in the last 72 hours No results found for this basename: CHOL:2,HDL:2,LDLCALC:2,TRIG:2,CHOLHDL:2,LDLDIRECT:2 in the last 72 hours No results found for this basename: TSH,T4TOTAL,FREET3,T3FREE,THYROIDAB in the last 72 hours No results found for this basename: VITAMINB12:2,FOLATE:2,FERRITIN:2,TIBC:2,IRON:2,RETICCTPCT:2 in the last 72 hours Coags: No results found for this basename: PT:2,INR:2 in the last 72 hours Microbiology: Recent Results (from the past 240 hour(s))  MRSA PCR SCREENING     Status: Normal   Collection Time   07/26/12  5:04 PM      Component Value Range Status Comment   MRSA by PCR NEGATIVE  NEGATIVE Final   SURGICAL PCR SCREEN     Status: Normal   Collection Time   07/29/12  2:37 AM      Component Value Range Status Comment   MRSA, PCR NEGATIVE  NEGATIVE Final    Staphylococcus aureus NEGATIVE  NEGATIVE Final      BRIEF HOSPITAL COURSE:   Active Problems: RLE pain and swelling -patient was admitted, there was suspicion for osteomyelitis, antibiotics were never started as patient was not septic and plans were to obtain a bone biopsy first. Patient underwent a MRI of the left foot/ankle, which did not show osteo, but showed severe synovitis. Because patient did not have fever or leukocytosis,  it was thought this was inflammatory. Ortho did try twice to aspirate the fluid, however was unsuccessful. Ultimately it was decided that patient would benefit from intra-articular steroid injection, this was done 11/23 in the OR. Following intra-articular steroid injection, there has been significant improvement in the patient' swelling and pain. Patient is stable to be discharged today.She will f/u ortho as outpatient.  Chest Pain -developed chest pain 11/25 -EKG showed new T wave inversions in the ant leads-but per cards these are old -Enzymes cycled-negative -per cards-she will undergo a nuclear stress test 11/26-if negative will be discharged hme  HIV  - last CD4 450 in 11/13 and Viral load undetectable, continue HAART   Anemia  -suspect 2/2 chronic disease  -claims to have occassional hemorrhoidal bleeds -further w/u deferred to the outpatient setting  COPD  -stable, PRN nebs   CAD/CHF (systolic)  - stable, compensated, continue plavix/coreg  -no obvious contra-indications for ACEI-start low dose Lisinopril -last Echo on 12/2011-EF of 25 to 30 %  HTN  -controlled with Coreg, Imdur and now adding Lisinopril   H/o CVA  -on Plavix and Statins  TODAY-DAY OF DISCHARGE:  Subjective:   [name] today has no headache,no chest abdominal pain,no new weakness tingling or numbness, feels much better wants to go home today.   Objective:   Blood pressure 126/77, pulse 78, temperature 98.5 F (36.9 C), temperature source Oral, resp. rate 24, height 5\' 2"  (1.575 m), weight 126.6 kg (279 lb 1.6 oz), last menstrual period 05/14/2011, SpO2 98.00%.  Intake/Output Summary (Last 24 hours) at 08/01/12 0857 Last data filed at 07/31/12 2200  Gross per 24 hour  Intake    540 ml  Output    400 ml  Net    140 ml    Exam Awake Alert, Oriented *3, No new F.N deficits, Normal affect [place].AT,PERRAL Supple Neck,No JVD, No cervical lymphadenopathy appriciated.  Symmetrical Chest wall  movement, Good air movement bilaterally, CTAB RRR,No Gallops,Rubs or new Murmurs, No Parasternal Heave +ve B.Sounds, Abd Soft, Non tender, No organomegaly appriciated, No rebound -guarding or rigidity. No Cyanosis, Clubbing or edema, No new Rash or bruise  DISCHARGE CONDITION: Stable  DISPOSITION: SNF  DISCHARGE INSTRUCTIONS:    Activity:  As tolerated with Full fall precautions use walker/cane & assistance as needed-WBAT as tolerated  Diet recommendation: Heart Healthy diet  Follow-up Information    Follow up with Mable Paris, MD. In 2 weeks.   Contact information:   [address] [place] [address] [phone]       Follow up with [facility], MD. Schedule an appointment as soon as possible for a visit in 2 weeks.   Contact information:   [address] [place] [address] [phone]       Follow up with Staci Righter, MD. Schedule an appointment as soon as possible for a visit in 2 weeks.   Contact information:   [address] [place] [address] [phone]       Follow up with [facility]. On 08/09/2012. (At 8:15 AM for stress test. )    Contact information:   [address] [place] [address]         Total Time spent on discharge equals 45 minutes.  SignedJeoffrey Massed 08/01/2012 8:57 AM

## 2012-07-31 NOTE — Progress Notes (Signed)
Pt. States pain in chest is gone after 2 SL nitro, still some pain in left arm rates "2" on scale of 0 -10.  IV team drew labs, will await results and continue to monitor.  Forbes Cellar, RN

## 2012-07-31 NOTE — Progress Notes (Deleted)
Clinical Social Work Department BRIEF PSYCHOSOCIAL ASSESSMENT 07/31/2012  Patient:  Link Snuffer     Account Number:  [id number]     Admit date:  07/28/2012  Clinical Social Worker:  Robin Searing  Date/Time:  07/31/2012 01:47 PM  Referred by:  Physician  Date Referred:  07/31/2012 Referred for  SNF Placement   Other Referral:   Interview type:  Patient Other interview type:   Daughter in law at bedside-    PSYCHOSOCIAL DATA Living Status:  ALONE Admitted from facility:  [facility] Level of care:  Independent Living Primary support name:  sonAnnette Stable [phone] Primary support relationship to patient:  FAMILY Degree of support available:   good    CURRENT CONCERNS Current Concerns  Post-Acute Placement   Other Concerns:    SOCIAL WORK ASSESSMENT / PLAN Patient admitted from IND LVG at [facility]- planning for SNF stay at d/c in the SNF unit at [facility].   Assessment/plan status:  Other - See comment Other assessment/ plan:   FL2 completion and [place] for SNF stay   Information/referral to community resources:   SNF  EMS    PATIENT'S/FAMILY'S RESPONSE TO PLAN OF CARE: Patient and family are agreeable to this plan for SNF unit at d/c,    Reece Levy, MSW, [employer] [phone]

## 2012-07-31 NOTE — Progress Notes (Signed)
Patient was seen this am-she did not have complaints then , plan was for discharge, however she started having left sided chest pains-that is sharp. Claims that it goes to the left arm. No nausea. No vomiting On exam-patient is reproducible at times. Does have significant cardiac history  O/E  Chest-b/l clear CVS-S1S2 Regular Abd-obese-but soft and non tender Ext-no edema Neuro- Non Focal  Imp  Chest Pain-prior h/o CAD and ischemic Cardiomyopathy  Plan -EKG-shows new T wave inversion in ant leads -Hold d/c -c/w ASA/Plavix -cycle enzymes -place on telmetry -have asked cardiology to evaluate-given the fact she has CAD.

## 2012-07-31 NOTE — Care Management Note (Addendum)
    Page 1 of 2   08/02/2012     10:36:40 AM   CARE MANAGEMENT NOTE 08/02/2012  Patient:  [name], [name]   Account Number:  [id number]  Date Initiated:  07/31/2012  Documentation initiated by:  Letha Cape  Subjective/Objective Assessment:   dx right leg pain, cellulitis  admit- from snf     Action/Plan:   pt eval-rec snf   Anticipated DC Date:  08/02/2012   Anticipated DC Plan:  HOME W HOME HEALTH SERVICES  In-house referral  Clinical Social Worker      DC Associate Professor  CM consult      [facility] Choice  HOME HEALTH   Choice offered to / List presented to:  C-1 Patient   DME arranged  BEDSIDE COMMODE  WALKER - ROLLING  WHEELCHAIR - MANUAL      DME agency  [facility]     HH arranged  HH-1 RN  HH-2 PT  HH-4 NURSE'S AIDE  HH-6 SOCIAL WORKER      HH agency  [facility]   Status of service:  Completed, signed off Medicare Important Message given?   (If response is "NO", the following Medicare IM given date fields will be blank) Date Medicare IM given:   Date Additional Medicare IM given:    Discharge Disposition:  SKILLED NURSING FACILITY  Per UR Regulation:  Reviewed for med. necessity/level of care/duration of stay  If discussed at Long Length of Stay Meetings, dates discussed:    Comments:  08/02/12 10:34 Letha Cape RN, BSN [phone] patient is for discharge to home today, faxed DME orders to [employer], spoke with representative there they have the dme for patient except for the w/chair cushion , this will have to be delivered to the patient and she will also have to purchase the shower chair or tub bench because insurance does not cover that.  Patient will go by [facility] to pick up DME after discharge.  08/01/12 16:47 Letha Cape RN, BSN [phone] patient insurance co.has denied snf for her, patient chose [facility] for hh services for [facility], PT, Aide and CSW, referral made to [facility], Lupita Leash notified. Soc will begin 24-48 post  discharge.  07/31/12 9:50 Letha Cape RN, BSN [phone] patient is from a snf, per physical therapy recs snf, CSW following, pt for dc today.

## 2012-07-31 NOTE — Progress Notes (Signed)
Pt. Called and stated she was having shooting pains in left chest area, going down to left arm.  VSS - Blood pressure 119/78, pulse 90, temperature 98.2 F (36.8 C), temperature source Oral, resp. rate 22, height 5\' 2"  (1.575 m), weight 126.6 kg (279 lb 1.6 oz), last menstrual period 05/14/2011, SpO2 100.00%., R/A.  Informed Dr. Jerral Ralph, ordered EKG and went to see pt.  Will continue to monitor and carry out orders.  Forbes Cellar, RN

## 2012-08-01 ENCOUNTER — Inpatient Hospital Stay (HOSPITAL_COMMUNITY): Payer: Medicare PPO

## 2012-08-01 ENCOUNTER — Encounter (HOSPITAL_COMMUNITY): Payer: Self-pay | Admitting: Orthopedic Surgery

## 2012-08-01 ENCOUNTER — Ambulatory Visit: Payer: Medicare PPO | Admitting: Internal Medicine

## 2012-08-01 DIAGNOSIS — R079 Chest pain, unspecified: Secondary | ICD-10-CM

## 2012-08-01 LAB — TROPONIN I: Troponin I: <0.3 ng/mL (ref <0.30)

## 2012-08-01 MED ORDER — OXYCODONE HCL 5 MG PO TABS
ORAL_TABLET | ORAL | Status: AC
  Filled 2012-08-01: qty 2

## 2012-08-01 MED ORDER — ASPIRIN 81 MG PO TBEC: 81.0000 mg | DELAYED_RELEASE_TABLET | Freq: Every day | ORAL | Status: DC

## 2012-08-01 MED ORDER — TECHNETIUM TC 99M SESTAMIBI GENERIC - CARDIOLITE
30.0000 | Freq: Once | INTRAVENOUS | Status: AC | PRN
  Administered 2012-08-01: 30 via INTRAVENOUS

## 2012-08-01 NOTE — Progress Notes (Signed)
Patient advised that her insurance is denying SNF coverage and she is considering going home with her boyfriend and adult daughter- PT has seen her today and supports this idea of d/c home at d/c. CSW will follow  Reece Levy, MSW, [place] [phone]

## 2012-08-01 NOTE — Progress Notes (Signed)
SNF Berkley Harvey has been denied by [facility] and I will inform patient and MD of this- if MD wants to appeal and do a peer to peer this can be done for reconsideration.   Reece Levy, MSW, Theresia Majors [phone]

## 2012-08-01 NOTE — Progress Notes (Signed)
Physical Therapy Treatment Patient Details Name: [name] MRN: [id number] DOB: Oct 07, 1960 Today's Date: 08/01/2012 Time: 7829-5621 PT Time Calculation (min): 24 min  PT Assessment / Plan / Recommendation Comments on Treatment Session  Pt adm with rt ankle/leg pain.  Pt making steady progress.  Pt now planning to return home with her boyfriend and her daughter.  Boyfriend has assisted pt at home on short visits from SNF.    Follow Up Recommendations  Home health PT;Supervision/Assistance - 24 hour     Does the patient have the potential to tolerate intense rehabilitation     Barriers to Discharge        Equipment Recommendations  Rolling walker with 5" wheels;Wheelchair (measurements);Wheelchair cushion (measurements);Other (comment) (drop arm commode, 20" w/c width)    Recommendations for Other Services    Frequency Min 3X/week (this is acute care frequency)   Plan Discharge plan needs to be updated;Frequency remains appropriate    Precautions / Restrictions Precautions Precautions: Fall Required Braces or Orthoses: Other Brace/Splint Other Brace/Splint: CAM boot on rt when amb/up Restrictions RLE Weight Bearing: Weight bearing as tolerated   Pertinent Vitals/Pain Pain in lt knee    Mobility  Transfers Sit to Stand: From bed;1: +2 Total assist;With upper extremity assist Sit to Stand: Patient Percentage: 80% Stand to Sit: 4: Min assist;With upper extremity assist;With armrests;To chair/3-in-1 Ambulation/Gait Ambulation/Gait Assistance: 4: Min assist Ambulation Distance (Feet): 8 Feet Assistive device: Rolling walker Ambulation/Gait Assistance Details: verbal cues not to step past walker Gait Pattern: Step-to pattern;Decreased step length - left;Decreased stance time - right    Exercises     PT Diagnosis:    PT Problem List:   PT Treatment Interventions:     PT Goals Acute Rehab PT Goals PT Goal: Sit to Stand - Progress: Progressing toward goal PT Goal:  Stand to Sit - Progress: Progressing toward goal PT Goal: Ambulate - Progress: Progressing toward goal  Visit Information  Last PT Received On: 08/01/12 Assistance Needed: +2 (for safety)    Subjective Data  Subjective: "I want to go home."   Cognition  Overall Cognitive Status: Appears within functional limits for tasks assessed/performed Arousal/Alertness: Awake/alert Orientation Level: Appears intact for tasks assessed Behavior During Session: [facility] for tasks performed    Balance  Static Standing Balance Static Standing - Balance Support: Bilateral upper extremity supported (on walker) Static Standing - Level of Assistance: 4: Min assist  End of Session PT - End of Session Equipment Utilized During Treatment: Gait belt Activity Tolerance: Patient tolerated treatment well Patient left: in chair;with call bell/phone within reach Nurse Communication: Mobility status   GP     [facility] 08/01/2012, 12:39 PM  [employer] PT [phone]

## 2012-08-01 NOTE — Progress Notes (Signed)
Patient ID: [name], female   DOB: 01-Dec-1960, 51 y.o.   MRN: [id number]    Subjective:  Denies SSCP, palpitations or Dyspnea Leg pain better  Objective:  Filed Vitals:   07/31/12 1627 07/31/12 1808 07/31/12 2054 08/01/12 0518  BP: 135/86 119/80 131/82 126/77  Pulse: 83 82 85 78  Temp:  98.5 F (36.9 C) 98.4 F (36.9 C) 98.5 F (36.9 C)  TempSrc:  Oral Oral Oral  Resp:  20 22 24   Height:      Weight:      SpO2:  99% 98% 98%    Intake/Output from previous day:  Intake/Output Summary (Last 24 hours) at 08/01/12 0840 Last data filed at 07/31/12 2200  Gross per 24 hour  Intake    540 ml  Output    400 ml  Net    140 ml    Physical Exam: Affect appropriate Obese black female HEENT: normal Neck supple with no adenopathy JVP normal no bruits no thyromegaly Lungs clear with no wheezing and good diaphragmatic motion Heart:  S1/S2 no murmur, no rub, gallop or click PMI normal Abdomen: benighn, BS positve, no tenderness, no AAA no bruit.  No HSM or HJR Distal pulses intact with no bruits No edema Neuro non-focal Skin warm and dry No muscular weakness   Lab Results: Basic Metabolic Panel:  Basename 07/30/12 0640  NA 139  K 4.2  CL 103  CO2 27  GLUCOSE 120*  BUN 14  CREATININE 0.66  CALCIUM 9.6  MG --  PHOS --   Liver Function Tests: No results found for this basename: AST:2,ALT:2,ALKPHOS:2,BILITOT:2,PROT:2,ALBUMIN:2 in the last 72 hours No results found for this basename: LIPASE:2,AMYLASE:2 in the last 72 hours CBC:  Basename 07/30/12 0640  WBC 5.3  NEUTROABS --  HGB 9.4*  HCT 30.7*  MCV 83.2  PLT 297   Cardiac Enzymes:  Basename 08/01/12 0540 07/31/12 2225 07/31/12 1629  CKTOTAL -- -- --  CKMB -- -- --  CKMBINDEX -- -- --  TROPONINI <0.30 <0.30 <0.30    Imaging: No results found.  Cardiac Studies:  ECG:  SR nonspecific lateral T wave changes no acute changes   Telemetry:  NSR 08/01/2012   Echo:   Medications:     .  acetaminophen  1,000 mg Oral TID  . aspirin  81 mg Oral Daily  . carvedilol  3.125 mg Oral BID WC  . clopidogrel  75 mg Oral Q breakfast  . elvitegravir-cobicistat-emtricitabine-tenofovir  1 tablet Oral Q breakfast  . enoxaparin (LOVENOX) injection  40 mg Subcutaneous Q24H  . febuxostat  80 mg Oral Daily  . gabapentin  300 mg Oral TID  . isosorbide mononitrate  60 mg Oral QPM  . lisinopril  2.5 mg Oral Daily  . multivitamin with minerals  1 tablet Oral Daily  . [COMPLETED] nitroGLYCERIN      . pantoprazole  40 mg Oral Daily  . polyethylene glycol  17 g Oral BID  . senna  2 tablet Oral BID  . traZODone  50 mg Oral QHS  . [DISCONTINUED] pantoprazole  20 mg Oral Daily       . sodium chloride 20 mL/hr at 07/29/12 1914    Assessment/Plan:  Chest Pain:  Agree mostly atypical features Distant stent  Troponins negative.  She is not sure if she is going home or not Was made NPO  Will try to do myovue today May need two day protocol because of weight.  Leg Pain: resolved negative  DVT no ischemia on gabapentin HTN:  Cotninue ACE  Charlton Haws 08/01/2012, 8:40 AM

## 2012-08-01 NOTE — Progress Notes (Signed)
PATIENT DETAILS Name: [name] Age: 51 y.o. Sex: female Date of Birth: [birth year] Admit Date: 07/26/2012 Admitting Physician Zannie Cove, MD EAV:[email or web address],BJYN, MD  Subjective: Pain and swelling of the RLE significantly better Chest pain resolved  Assessment/Plan: Active Problems: Chest Pain -enzymes negative -on ASA, Plavix -for nuclear stress test this am  RLE pain and swelling -MRI Foot and Ankle-not showing definite osteomyelitis, showing Synovitis and some bone edema-now s/p steroid injection-with clinical improvement. -No role for antibiotics given rapid and significant clinical improvement following  -doppler neg for DVT  HIV - last CD4 450 in 11/13 and Viral load undetectable, continue HAART   Anemia -suspect 2/2 chronic disease -claims to have occassional hemorrhoidal bleeds -H/H stable within past range-will check anemia panel and monitor for now  COPD -stable, PRN nebs  CAD/CHF (systolic) - stable, compensated, continue /ASAplavix/coreg -no obvious contra-indications for ACEI-started low dose Lisinopril-was not on it prior to admission -care with IVF to prevent vol overload -last Echo on 12/2011-EF of 25 to 30 %  HTN -controlled with Coreg, Imdur and now adding Lisinopril  H/o CVA -on Plavix/ASA and Statins  GERD -PPI  Disposition: Remain inpatient  DVT Prophylaxis: Prophylactic Lovenox   Code Status: Full code   Procedures:  PICC line-11/21  CONSULTS:  ID and Ortho  PHYSICAL EXAM: Vital signs in last 24 hours: Filed Vitals:   07/31/12 1627 07/31/12 1808 07/31/12 2054 08/01/12 0518  BP: 135/86 119/80 131/82 126/77  Pulse: 83 82 85 78  Temp:  98.5 F (36.9 C) 98.4 F (36.9 C) 98.5 F (36.9 C)  TempSrc:  Oral Oral Oral  Resp:  20 22 24   Height:      Weight:      SpO2:  99% 98% 98%    Weight change:  Body mass index is 51.05 kg/(m^2).   Gen Exam: Awake and alert with clear speech.   Neck: Supple, No JVD.   Chest:  B/L Clear.   CVS: S1 S2 Regular, no murmurs.  Abdomen: soft, BS +, non tender, non distended.  Extremities: no edema, lower extremities warm to touch.Right lower ext/Dorsum-now minimal swelling present Neurologic: Non Focal.   Skin: No Rash.  Wounds: N/A.    Intake/Output from previous day:  Intake/Output Summary (Last 24 hours) at 08/01/12 0900 Last data filed at 07/31/12 2200  Gross per 24 hour  Intake    540 ml  Output    400 ml  Net    140 ml     LAB RESULTS: CBC  Lab 07/30/12 0640 07/29/12 0434 07/28/12 0500 07/27/12 0730 07/26/12 1651  WBC 5.3 5.3 5.1 4.5 5.4  HGB 9.4* 8.3* 8.6* 8.4* 9.4*  HCT 30.7* 27.5* 28.8* 27.5* 30.8*  PLT 297 243 258 245 257  MCV 83.2 84.1 83.7 82.1 83.9  MCH 25.5* 25.4* 25.0* 25.1* 25.6*  MCHC 30.6 30.2 29.9* 30.5 30.5  RDW 14.8 15.0 15.0 14.9 14.8  LYMPHSABS -- -- -- -- 1.4  MONOABS -- -- -- -- 0.6  EOSABS -- -- -- -- 0.1  BASOSABS -- -- -- -- 0.0  BANDABS -- -- -- -- --    Chemistries   Lab 07/30/12 0640 07/29/12 0434 07/28/12 0500 07/27/12 0730 07/26/12 1651  NA 139 143 137 139 138  K 4.2 3.9 3.8 3.3* 3.4*  CL 103 108 103 103 102  CO2 27 29 28 26 29   GLUCOSE 120* 96 112* 112* 100*  BUN 14 13 11 10 9   CREATININE 0.66 0.64 0.66 0.66  0.82  CALCIUM 9.6 9.2 9.2 9.3 9.3  MG -- -- -- -- --    CBG:  Lab 07/29/12 1217  GLUCAP 81    GFR Estimated Creatinine Clearance: 106 ml/min (by C-G formula based on Cr of 0.66).  Coagulation profile No results found for this basename: INR:5,PROTIME:5 in the last 168 hours  Cardiac Enzymes  Lab 08/01/12 0540 07/31/12 2225 07/31/12 1629  CKMB -- -- --  TROPONINI <0.30 <0.30 <0.30  MYOGLOBIN -- -- --    No components found with this basename: POCBNP:3 No results found for this basename: DDIMER:2 in the last 72 hours No results found for this basename: HGBA1C:2 in the last 72 hours No results found for this basename: CHOL:2,HDL:2,LDLCALC:2,TRIG:2,CHOLHDL:2,LDLDIRECT:2 in the last 72  hours No results found for this basename: TSH,T4TOTAL,FREET3,T3FREE,THYROIDAB in the last 72 hours No results found for this basename: VITAMINB12:2,FOLATE:2,FERRITIN:2,TIBC:2,IRON:2,RETICCTPCT:2 in the last 72 hours No results found for this basename: LIPASE:2,AMYLASE:2 in the last 72 hours  Urine Studies No results found for this basename: UACOL:2,UAPR:2,USPG:2,UPH:2,UTP:2,UGL:2,UKET:2,UBIL:2,UHGB:2,UNIT:2,UROB:2,ULEU:2,UEPI:2,UWBC:2,URBC:2,UBAC:2,CAST:2,CRYS:2,UCOM:2,BILUA:2 in the last 72 hours  MICROBIOLOGY: Recent Results (from the past 240 hour(s))  MRSA PCR SCREENING     Status: Normal   Collection Time   07/26/12  5:04 PM      Component Value Range Status Comment   MRSA by PCR NEGATIVE  NEGATIVE Final   SURGICAL PCR SCREEN     Status: Normal   Collection Time   07/29/12  2:37 AM      Component Value Range Status Comment   MRSA, PCR NEGATIVE  NEGATIVE Final    Staphylococcus aureus NEGATIVE  NEGATIVE Final     RADIOLOGY STUDIES/RESULTS: Dg Tibia/fibula Right  07/05/2012  *RADIOLOGY REPORT*  Clinical Data: Increase swelling and pain right lower extremity.  RIGHT TIBIA AND FIBULA - 2 VIEW  Comparison: 07/04/2012  Findings: Right tibia and fibula appear intact.  No malalignment or displaced fracture.  Degenerative changes of the ankle joint noted. Mild diffuse soft tissue swelling.  Patchy osteopenia noted at the ankle.  IMPRESSION: No acute osseous abnormality.  Right ankle degenerative changes and periarticular osteopenia.   Original Report Authenticated By: Judie Petit. Ruel Favors, M.D.    Dg Ankle 2 Views Right  07/04/2012  *RADIOLOGY REPORT*  Clinical Data: Pain post fall.  RIGHT ANKLE - 2 VIEW  Comparison: 06/09/2012  Findings: Calcaneal spur.  Ankle mortise intact. There has been some subchondral resorption suggesting disuse osteopenia. Ankle mortise intact. Negative for fracture, dislocation, or other acute abnormality.  Normal alignment. No significant degenerative change. Regional  soft tissues unremarkable.  IMPRESSION:  No acute abnormality   Original Report Authenticated By: Osa Craver, M.D.    Dg Shoulder Left  07/04/2012  *RADIOLOGY REPORT*  Clinical Data: Pain post fall.  LEFT SHOULDER - 2+ VIEW  Comparison: 02/14/2012  Findings: Negative for fracture, dislocation, or other acute abnormality.  Normal alignment and mineralization. No significant degenerative change.  Regional soft tissues unremarkable.  IMPRESSION:  Negative   Original Report Authenticated By: Osa Craver, M.D.    Dg Foot 2 Views Right  07/04/2012  *RADIOLOGY REPORT*  Clinical Data: Pain post fall  RIGHT FOOT - 2 VIEW  Comparison: 06/06/2012  Findings: Calcaneal spur at the plantar aponeurosis.  Diffuse soft tissue swelling most marked dorsally.  Negative for fracture, dislocation, or other acute bone injury.  Normal mineralization and alignment.  IMPRESSION:  1.  Negative for fracture or other acute bone injury. 2.  Dorsal soft tissue swelling. 3.  Calcaneal spur   Original Report Authenticated By: Osa Craver, M.D.     MEDICATIONS: Scheduled Meds:    . acetaminophen  1,000 mg Oral TID  . aspirin  81 mg Oral Daily  . carvedilol  3.125 mg Oral BID WC  . clopidogrel  75 mg Oral Q breakfast  . elvitegravir-cobicistat-emtricitabine-tenofovir  1 tablet Oral Q breakfast  . enoxaparin (LOVENOX) injection  40 mg Subcutaneous Q24H  . febuxostat  80 mg Oral Daily  . gabapentin  300 mg Oral TID  . isosorbide mononitrate  60 mg Oral QPM  . lisinopril  2.5 mg Oral Daily  . multivitamin with minerals  1 tablet Oral Daily  . [COMPLETED] nitroGLYCERIN      . pantoprazole  40 mg Oral Daily  . polyethylene glycol  17 g Oral BID  . senna  2 tablet Oral BID  . traZODone  50 mg Oral QHS  . [DISCONTINUED] pantoprazole  20 mg Oral Daily   Continuous Infusions:    . sodium chloride 20 mL/hr at 07/29/12 1914   PRN Meds:.acetaminophen, albuterol, bisacodyl, HYDROmorphone  (DILAUDID) injection, nitroGLYCERIN, ondansetron (ZOFRAN) IV, oxyCODONE, sodium chloride  Antibiotics: Anti-infectives     Start     Dose/Rate Route Frequency Ordered Stop   07/27/12 0800   elvitegravir-cobicistat-emtricitabine-tenofovir (STRIBILD) 150-150-200-300 MG tablet 1 tablet  Status:  Discontinued        1 tablet Oral Daily with breakfast 07/26/12 1642 07/26/12 1703   07/27/12 0800   elvitegravir-cobicistat-emtricitabine-tenofovir (STRIBILD) 150-150-200-300 MG tablet 1 tablet        1 tablet Oral Daily with breakfast 07/26/12 1704             Jeoffrey Massed, MD  [facility] Pager:[phone]  If 7PM-7AM, please contact night-coverage [email or web address] Password [email or web address] 08/01/2012, 9:00 AM   LOS: 6 days

## 2012-08-02 DIAGNOSIS — D649 Anemia, unspecified: Secondary | ICD-10-CM

## 2012-08-02 DIAGNOSIS — R079 Chest pain, unspecified: Secondary | ICD-10-CM

## 2012-08-02 LAB — CREATININE, SERUM
GFR calc Af Amer: >90 mL/min (ref >90)
GFR calc non Af Amer: >90 mL/min (ref >90)

## 2012-08-02 MED ORDER — REGADENOSON 0.4 MG/5ML IV SOLN
0.4000 mg | Freq: Once | INTRAVENOUS | Status: AC
  Administered 2012-08-02: 0.4 mg via INTRAVENOUS
  Filled 2012-08-02: qty 5

## 2012-08-02 MED ORDER — TECHNETIUM TC 99M SESTAMIBI GENERIC - CARDIOLITE
30.0000 | Freq: Once | INTRAVENOUS | Status: AC | PRN
  Administered 2012-08-02: 30 via INTRAVENOUS

## 2012-08-02 MED ORDER — TECHNETIUM TC 99M SESTAMIBI GENERIC - CARDIOLITE
30.0000 | Freq: Once | INTRAVENOUS | Status: AC | PRN
  Administered 2012-08-01: 30 via INTRAVENOUS

## 2012-08-02 NOTE — Progress Notes (Signed)
Patient planning for d/c to home at d/c with [facility] services- CSW to sign off- please reconsult if needs arise. Reece Levy, MSW, Theresia Majors [phone]

## 2012-08-02 NOTE — Progress Notes (Signed)
Nsg Discharge Note  Admit Date:  07/26/2012 Discharge date: 08/02/2012   [name] to be D/C'd Home per MD order.  AVS completed.  Copy for chart, and copy for patient signed, and dated. Patient/caregiver able to verbalize understanding.  Discharge Medication:  Brooke, [name]  Home Medication Instructions ZHY:[id number]   Printed on:08/02/12 1854  Medication Information                    albuterol (PROVENTIL HFA;VENTOLIN HFA) 108 (90 BASE) MCG/ACT inhaler Inhale 2 puffs into the lungs every 6 (six) hours as needed. For wheezing and shortness of breath           nitroGLYCERIN (NITROSTAT) 0.3 MG SL tablet Place 0.3 mg under the tongue every 5 (five) minutes as needed. For chest pain           isosorbide mononitrate (IMDUR) 60 MG 24 hr tablet Take 60 mg by mouth every evening. 5pm           carvedilol (COREG) 3.125 MG tablet Take 3.125 mg by mouth 2 (two) times daily with a meal. 9am and 6pm           diphenoxylate-atropine (LOMOTIL) 2.5-0.025 MG per tablet Take 1 tablet by mouth 4 (four) times daily as needed. For loose stool           pantoprazole (PROTONIX) 20 MG tablet Take 20 mg by mouth daily.            albuterol (PROVENTIL) (5 MG/ML) 0.5% nebulizer solution Take 2.5 mg by nebulization every 2 (two) hours as needed. For shortness of breath           valACYclovir (VALTREX) 1000 MG tablet Take 1,000 mg by mouth Once daily as needed. For flare ups           febuxostat (ULORIC) 40 MG tablet Take 80 mg by mouth daily.           traZODone (DESYREL) 50 MG tablet Take 50 mg by mouth at bedtime.           elvitegravir-cobicistat-emtricitabine-tenofovir (STRIBILD) 150-150-200-300 MG TABS Take 1 tablet by mouth daily with breakfast.           clopidogrel (PLAVIX) 75 MG tablet Take 75 mg by mouth daily.           bisacodyl (DULCOLAX) 5 MG EC tablet Take 10 mg by mouth daily as needed. For constipation           gabapentin (NEURONTIN) 300 MG capsule Take 300 mg  by mouth 3 (three) times daily.           Multiple Vitamin (MULTIVITAMIN WITH MINERALS) TABS Take 1 tablet by mouth daily.           oxyCODONE-acetaminophen (ROXICET) 5-325 MG per tablet Take 1-2 tablets by mouth every 6 (six) hours as needed for pain.           acetaminophen (TYLENOL) 500 MG tablet Take 2 tablets (1,000 mg total) by mouth 3 (three) times daily.           lisinopril (PRINIVIL,ZESTRIL) 2.5 MG tablet Take 1 tablet (2.5 mg total) by mouth daily.           polyethylene glycol (MIRALAX / GLYCOLAX) packet Take 17 g by mouth daily.           senna (SENOKOT) 8.6 MG TABS Take 2 tablets (17.2 mg total) by mouth at bedtime.  rosuvastatin (CRESTOR) 5 MG tablet Take 1 tablet (5 mg total) by mouth every evening. 5pm           aspirin 81 MG EC tablet Take 1 tablet (81 mg total) by mouth daily. Swallow whole.             Discharge Assessment: Filed Vitals:   08/02/12 1455  BP: 121/61  Pulse: 84  Temp: 98.6 F (37 C)  Resp:    Skin clean, dry and intact without evidence of skin break down, no evidence of skin tears noted. IV catheter discontinued intact. Site without signs and symptoms of complications - no redness or edema noted at insertion site, patient denies c/o pain - only slight tenderness at site.  Dressing with slight pressure applied.  D/c Instructions-Education: Discharge instructions given to patient/family with verbalized understanding. D/c education completed with patient/family including follow up instructions, medication list, d/c activities limitations if indicated, with other d/c instructions as indicated by MD - patient able to verbalize understanding, all questions fully answered. Patient instructed to return to ED, call 911, or call MD for any changes in condition.  Patient escorted via WC, and D/C home via private auto.  Cecil Bixby Consuella Lose, RN 08/02/2012 6:54 PM

## 2012-08-02 NOTE — Progress Notes (Signed)
PATIENT ID: [name]   4 Days Post-Op Procedure(s) (LRB): IRRIGATION AND DEBRIDEMENT EXTREMITY (Right)  Subjective:patient reports minimal pain.  She is very happy that her foot has improved so much and swelling has gone down.  shehas taken some steps with physical therapy in her Cam Walker boot and hasonly minimal pain.ready to go home.  Objective:  Filed Vitals:   08/02/12 0523  BP: 120/68  Pulse: 73  Temp: 97.8 F (36.6 C)  Resp: 20     Awake alert and oriented Examination of the right lower extremity demonstrates mild swelling, no erythema or warmth Some mild discomfort to palpation along the lateral ankle Near full dorsiflexion and plantar flexion Wiggles toes Distally neurovascularly intact  Labs:  No results found for this basename: HGB:5 in the last 72 hoursNo results found for this basename: WBC:2,RBC:2,HCT:2,PLT:2 in the last 72 hours Basename 08/02/12 0510  NA --  K --  CL --  CO2 --  BUN --  CREATININE 0.72  GLUCOSE --  CALCIUM --    Assessment and Plan: Pain and function is much better after corticosteroid injection  Okay to d/c from orthopedic standpoint when medically stable. Looks like that will hopefully be today. CAM walker boot to assist with ambulation, can be out of it when not ambulating  WBAT  Fu in 1-2 weeks with Dr. Ave Filter  Script for Percocet 5-325 mg 1-2 po q 6 hrs prn pain in chart  VTE proph: SCDs, lovenox

## 2012-08-02 NOTE — Progress Notes (Signed)
Physical Therapy Treatment Patient Details Name: [name] MRN: [id number] DOB: Nov 07, 1960 Today's Date: 08/02/2012 Time: 4132-4401 PT Time Calculation (min): 20 min  PT Assessment / Plan / Recommendation Comments on Treatment Session  Pt adm with rt ankle/leg pain.  Pt making steady progress, increased gait distance today.  Pt now planning to return home with her boyfriend and her daughter.  Boyfriend has assisted pt with entering home in [facility] previously.     Follow Up Recommendations  Home health PT;Supervision/Assistance - 24 hour     Does the patient have the potential to tolerate intense rehabilitation     Barriers to Discharge        Equipment Recommendations  Rolling walker with 5" wheels;Wheelchair (measurements);Wheelchair cushion (measurements);Other (comment) (drop arm commode, 20" w/c width)    Recommendations for Other Services    Frequency Min 3X/week   Plan Frequency remains appropriate;Discharge plan remains appropriate    Precautions / Restrictions Precautions Precautions: Fall Required Braces or Orthoses: Other Brace/Splint Other Brace/Splint: CAM boot on rt when amb/up Restrictions RLE Weight Bearing: Weight bearing as tolerated   Pertinent Vitals/Pain **1/10 pain RLE, pt premedicated*    Mobility  Bed Mobility Bed Mobility: Sitting - Scoot to Edge of Bed;Supine to Sit Supine to Sit: 6: Modified independent (Device/Increase time);HOB elevated;With rails Sitting - Scoot to Edge of Bed: 6: Modified independent (Device/Increase time) Transfers Transfers: Sit to Stand;Stand to Sit Sit to Stand: With upper extremity assist;With armrests;From chair/3-in-1;From bed;4: Min assist Sit to Stand: Patient Percentage: 80% Stand to Sit: 1: +2 Total assist;To chair/3-in-1;With upper extremity assist Stand to Sit: Patient Percentage: 90% Lateral/Scoot Transfers: 4: Min guard;With armrests removed Details for Transfer Assistance: Performed scooting transfer from  bed to [facility].   Ambulation/Gait Ambulation/Gait Assistance: 4: Min guard Ambulation Distance (Feet): 18 Feet (12' + 6') Assistive device: Rolling walker Gait Pattern: Step-to pattern;Decreased step length - left;Decreased stance time - right;Trunk flexed Gait velocity: decr General Gait Details: VCs for sequencing with RW    Exercises     PT Diagnosis:    PT Problem List:   PT Treatment Interventions:     PT Goals Acute Rehab PT Goals PT Goal Formulation: With patient Time For Goal Achievement: 08/05/12 Potential to Achieve Goals: Good Pt will go Sit to Stand: with min assist;with upper extremity assist PT Goal: Sit to Stand - Progress: Met Pt will go Stand to Sit: with min assist;with upper extremity assist PT Goal: Stand to Sit - Progress: Met Pt will Transfer Bed to Chair/Chair to Bed: with modified independence PT Transfer Goal: Bed to Chair/Chair to Bed - Progress: Progressing toward goal Pt will Ambulate: 16 - 50 feet;with rolling walker;with supervision PT Goal: Ambulate - Progress: Updated due to goal met  Visit Information  Last PT Received On: 08/02/12 Assistance Needed: +1    Subjective Data  Subjective: I'm ready to get up and walk.    Cognition  Overall Cognitive Status: Appears within functional limits for tasks assessed/performed Arousal/Alertness: Awake/alert Orientation Level: Appears intact for tasks assessed Behavior During Session: [facility] for tasks performed    Balance     End of Session PT - End of Session Equipment Utilized During Treatment: Gait belt Activity Tolerance: Patient tolerated treatment well Patient left: in chair;with call bell/phone within reach Nurse Communication: Mobility status   GP     Ralene Bathe Kistler 08/02/2012, 9:46 AM [phone]

## 2012-08-02 NOTE — Progress Notes (Signed)
Patient: [name] / Admit Date: 07/26/2012 / Date of Encounter: 08/02/2012, 10:31 AM   Subjective  No Cp or SOB. Foot feels "so much better!"   Objective   Telemetry: unable to review as patient seen down in nuc - see strips Physical Exam: Filed Vitals:   08/02/12 1026  BP: 121/75  Pulse: 66  Temp: 97.8  Resp: 20   General: Well developed, well nourished obese AAF in no acute distress. Head: Normocephalic, atraumatic, sclera non-icteric, no xanthomas, nares are without discharge. Neck: JVD not elevated. Lungs: Clear bilaterally to auscultation without wheezes, rales, or rhonchi. Breathing is unlabored. Heart: RRR S1 S2 without murmurs, rubs, or gallops.  Abdomen: Soft, non-tender, non-distended with normoactive bowel sounds. No hepatomegaly. No rebound/guarding. No obvious abdominal masses. Msk:  Strength and tone appear normal for age. Extremities: No clubbing or cyanosis. Tr edema.  Distal pedal pulses are in tact and equal bilaterally. Neuro: Alert and oriented X 3. Moves all extremities spontaneously. Psych:  Responds to questions appropriately with a normal affect.    Intake/Output Summary (Last 24 hours) at 08/02/12 1031 Last data filed at 08/01/12 2100  Gross per 24 hour  Intake    400 ml  Output      0 ml  Net    400 ml    Inpatient Medications:    . acetaminophen  1,000 mg Oral TID  . aspirin  81 mg Oral Daily  . carvedilol  3.125 mg Oral BID WC  . clopidogrel  75 mg Oral Q breakfast  . elvitegravir-cobicistat-emtricitabine-tenofovir  1 tablet Oral Q breakfast  . enoxaparin (LOVENOX) injection  40 mg Subcutaneous Q24H  . febuxostat  80 mg Oral Daily  . gabapentin  300 mg Oral TID  . isosorbide mononitrate  60 mg Oral QPM  . lisinopril  2.5 mg Oral Daily  . multivitamin with minerals  1 tablet Oral Daily  . [EXPIRED] oxyCODONE      . pantoprazole  40 mg Oral Daily  . polyethylene glycol  17 g Oral BID  . [COMPLETED] regadenoson  0.4 mg Intravenous  Once  . senna  2 tablet Oral BID  . traZODone  50 mg Oral QHS    Labs:  Basename 08/02/12 0510  NA --  K --  CL --  CO2 --  GLUCOSE --  BUN --  CREATININE 0.72  CALCIUM --  MG --  PHOS --    Basename 08/01/12 0540 07/31/12 2225 07/31/12 1629  CKTOTAL -- -- --  CKMB -- -- --  TROPONINI <0.30 <0.30 <0.30    Radiology/Studies:  Dg Tibia/fibula Right  2012-07-11  *RADIOLOGY REPORT*  Clinical Data: Increase swelling and pain right lower extremity.  RIGHT TIBIA AND FIBULA - 2 VIEW  Comparison: 07/04/2012  Findings: Right tibia and fibula appear intact.  No malalignment or displaced fracture.  Degenerative changes of the ankle joint noted. Mild diffuse soft tissue swelling.  Patchy osteopenia noted at the ankle.  IMPRESSION: No acute osseous abnormality.  Right ankle degenerative changes and periarticular osteopenia.   Original Report Authenticated By: Judie Petit. Ruel Favors, M.D.    Dg Ankle 2 Views Right  07/04/2012  *RADIOLOGY REPORT*  Clinical Data: Pain post fall.  RIGHT ANKLE - 2 VIEW  Comparison: 06/09/2012  Findings: Calcaneal spur.  Ankle mortise intact. There has been some subchondral resorption suggesting disuse osteopenia. Ankle mortise intact. Negative for fracture, dislocation, or other acute abnormality.  Normal alignment. No significant degenerative change. Regional soft tissues unremarkable.  IMPRESSION:  No acute abnormality   Original Report Authenticated By: Osa Craver, M.D.    Mr Foot Right W Wo Contrast  07/28/2012  *RADIOLOGY REPORT*  Clinical Data: Severe foot pain and swelling for 1 month.  MRI OF THE RIGHT FOREFOOT WITHOUT AND WITH CONTRAST  Technique:  Multiplanar, multisequence MR imaging was performed both before and after administration of intravenous contrast.  Contrast: 20mL MULTIHANCE GADOBENATE DIMEGLUMINE 529 MG/ML IV SOLN  Comparison: CT scan dated 06/09/2012  Findings: The patient has diffuse subcutaneous edema of the forefoot particularly on the  dorsum of the foot. 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  After contrast administration there is abnormal enhancement of the joint spaces at the fourth and fifth tarsometatarsal joints and at the first metatarsal phalangeal joint.  No appreciable fluid collections.  Findings  are consistent with synovitis of those joints.  No evidence of osteomyelitis.  No visible soft tissue abscesses.  IMPRESSION:  Synovitis at the fourth and fifth tarsometatarsal joints and the first metatarsal phalangeal joint.  Atypical infection should be considered.   Original Report Authenticated By: Francene Boyers, M.D.    Mr Ankle Right W Wo Contrast  07/28/2012  *RADIOLOGY REPORT*  Clinical Data:  Severe right ankle pain and swelling.  MRI OF THE RIGHT ANKLE WITH AND WITHOUT CONTRAST  Technique:  Multiplanar, multisequence MR imaging of the right ankle was performed before and after the administration of intravenous contrast.  Contrast:  20 ml Multihance  Comparison:  Radiographs dated 07/05/2012 and a CT scan dated 06/09/2012  Findings: There is prominent hypertrophy and edema of the synovium of the ankle joint end of the subtalar joint.  There is patchy edema in the distal tibia and fibula, talus, and calcaneus.  However, there are no discrete fluid collections in the ankle or subtalar joint.  There is also a soft tissue inflammation around the posterior tibialis and flexor digitorum longus and flexor hallucis longus tendons and adjacent to the peroneal tendons at the level of the ankle.  Comparing the osseous structures with the CT scan performed on 06/09/2012, there has been no bone destruction or progression of arthritic changes noted at that time.  The Achilles tendon and plantar fascia are normal.  The anterior tendons are normal.  IMPRESSION: Findings consistent with severe synovitis at the ankle joint and to a lesser degree at the subtalar joint with secondary  inflammatory changes in the bones as described.  However, there is no bone destruction to suggest osteomyelitis there is no fluid in the joints to suggest septic joints.  Given the patient's history, the possibility of an atypical synovitis without pus should be considered. Biopsy of the ankle synovium may be useful.   Original Report Authenticated By: Francene Boyers, M.D.    Ir Fluoro Guide Cv Line Left  07/28/2012  *RADIOLOGY REPORT*  PICC PLACEMENT WITH ULTRASOUND AND FLUOROSCOPIC  GUIDANCE  Clinical History: Right foot infection  Fluoroscopy Time: 0.2 minutes.  Procedure:  The left arm was prepped with chlorhexidine, draped in the usual sterile fashion using maximum barrier technique (cap and mask, sterile gown, sterile gloves, large sterile sheet, hand hygiene and cutaneous antiseptic).  Local anesthesia was attained by infiltration with 1% lidocaine.  Ultrasound demonstrated patency of the eft basilic vein, and this was documented with an image.  Under real-time ultrasound guidance, this vein was accessed with a 21 gauge micropuncture needle and image documentation was performed.  The needle was  exchanged over a guidewire for a peel-away sheath through which a 33.5 cm 5 [place] dual lumen power injectable PICC was advanced, and positioned with its tip at the lower SVC/right atrial junction.  Fluoroscopy during the procedure and fluoro spot radiograph confirms appropriate catheter position.  The catheter was flushed, secured to the skin with Prolene sutures, and covered with a sterile dressing.  Complications:  None.  The patient tolerated the procedure well.  IMPRESSION: Successful placement of a left arm PICC with sonographic and fluoroscopic guidance.  The catheter is ready for use.  Signed,  Sterling Big, MD Vascular & Interventional Radiologist [facility] Radiology   Original Report Authenticated By: Malachy Moan, M.D.    Ir US Guide Vasc Access Left  07/28/2012  *RADIOLOGY REPORT*  PICC  PLACEMENT WITH ULTRASOUND AND FLUOROSCOPIC  GUIDANCE  Clinical History: Right foot infection  Fluoroscopy Time: 0.2 minutes.  Procedure:  The left arm was prepped with chlorhexidine, draped in the usual sterile fashion using maximum barrier technique (cap and mask, sterile gown, sterile gloves, large sterile sheet, hand hygiene and cutaneous antiseptic).  Local anesthesia was attained by infiltration with 1% lidocaine.  Ultrasound demonstrated patency of the eft basilic vein, and this was documented with an image.  Under real-time ultrasound guidance, this vein was accessed with a 21 gauge micropuncture needle and image documentation was performed.  The needle was exchanged over a guidewire for a peel-away sheath through which a 33.5 cm 5 [place] dual lumen power injectable PICC was advanced, and positioned with its tip at the lower SVC/right atrial junction.  Fluoroscopy during the procedure and fluoro spot radiograph confirms appropriate catheter position.  The catheter was flushed, secured to the skin with Prolene sutures, and covered with a sterile dressing.  Complications:  None.  The patient tolerated the procedure well.  IMPRESSION: Successful placement of a left arm PICC with sonographic and fluoroscopic guidance.  The catheter is ready for use.  Signed,  Sterling Big, MD Vascular & Interventional Radiologist [facility] Radiology   Original Report Authenticated By: Malachy Moan, M.D.    Dg Shoulder Left  07/04/2012  *RADIOLOGY REPORT*  Clinical Data: Pain post fall.  LEFT SHOULDER - 2+ VIEW  Comparison: 02/14/2012  Findings: Negative for fracture, dislocation, or other acute abnormality.  Normal alignment and mineralization. No significant degenerative change.  Regional soft tissues unremarkable.  IMPRESSION:  Negative   Original Report Authenticated By: Osa Craver, M.D.    Dg Foot 2 Views Right  07/04/2012  *RADIOLOGY REPORT*  Clinical Data: Pain post fall  RIGHT FOOT - 2 VIEW   Comparison: 06/06/2012  Findings: Calcaneal spur at the plantar aponeurosis.  Diffuse soft tissue swelling most marked dorsally.  Negative for fracture, dislocation, or other acute bone injury.  Normal mineralization and alignment.  IMPRESSION:  1.  Negative for fracture or other acute bone injury. 2.  Dorsal soft tissue swelling. 3.  Calcaneal spur   Original Report Authenticated By: Osa Craver, M.D.      Assessment and Plan  1. Chest pain, atypical 2. Foot pain/synovitis  3. H/o CAD 4. Chronic systolic CHF 5. HIV, on HAART 6. H/o CVA  Patient seen briefly in nuc lab.  Await stress portion of myoview. MD to follow today.  Signed, Ronie Spies PA-C

## 2012-08-02 NOTE — Discharge Summary (Signed)
PATIENT DETAILS Name: [name] Age: 51 y.o. Sex: female Date of Birth: [birth year] MRN: [id number]. Admit Date: 07/26/2012 Admitting Physician: Zannie Cove, MD WUJ:WJXBJY,NWGN, MD  Recommendations for Outpatient Follow-up:  1. Monitor CBC periodically, if persistently anemic-will need further outpatient work up  PRIMARY DISCHARGE DIAGNOSIS:   Active Problems:  HUMAN IMMUNODEFICIENCY VIRUS [HIV]  COPD  Right leg pain Chest Pain      PAST MEDICAL HISTORY: Past Medical History  Diagnosis Date  . Cholecystitis     Gall bladder removed   . Hypercholesterolemia   . Fibroids   . HIV (human immunodeficiency virus infection) 05/27/11  . Pelvic pain in female 10/14/11  . PMB (postmenopausal bleeding)     Since 2010  . Increased BMI 05/27/11  . Anxiety   . Depression   . Hypertension   . H/O varicella   . History of measles, mumps, or rubella   . Blood transfusion   . Hypothyroidism   . CHF (congestive heart failure)   . Myocardial infarction 07/2004  . COPD (chronic obstructive pulmonary disease)   . Pneumonia 1980's    "once"  . Iron deficiency anemia   . Lower GI bleed 04/24/2012    recurrent; "last episode 03/2012)  . Stroke 12/2011    residual "speech messes up when I get sick or real tired" (04/24/2012)  . Arthritis     "knees"    DISCHARGE MEDICATIONS:   Medication List     As of 08/02/2012  2:57 PM    STOP taking these medications         HYDROmorphone 2 MG tablet   Commonly known as: DILAUDID      TAKE these medications         acetaminophen 500 MG tablet   Commonly known as: TYLENOL   Take 2 tablets (1,000 mg total) by mouth 3 (three) times daily.      albuterol 108 (90 BASE) MCG/ACT inhaler   Commonly known as: PROVENTIL HFA;VENTOLIN HFA   Inhale 2 puffs into the lungs every 6 (six) hours as needed. For wheezing and shortness of breath      albuterol (5 MG/ML) 0.5% nebulizer solution   Commonly known as: PROVENTIL   Take 2.5 mg by  nebulization every 2 (two) hours as needed. For shortness of breath      aspirin 81 MG EC tablet   Take 1 tablet (81 mg total) by mouth daily. Swallow whole.      bisacodyl 5 MG EC tablet   Commonly known as: DULCOLAX   Take 10 mg by mouth daily as needed. For constipation      carvedilol 3.125 MG tablet   Commonly known as: COREG   Take 3.125 mg by mouth 2 (two) times daily with a meal. 9am and 6pm      clopidogrel 75 MG tablet   Commonly known as: PLAVIX   Take 75 mg by mouth daily.      diphenoxylate-atropine 2.5-0.025 MG per tablet   Commonly known as: LOMOTIL   Take 1 tablet by mouth 4 (four) times daily as needed. For loose stool      elvitegravir-cobicistat-emtricitabine-tenofovir 150-150-200-300 MG Tabs   Commonly known as: STRIBILD   Take 1 tablet by mouth daily with breakfast.      febuxostat 40 MG tablet   Commonly known as: ULORIC   Take 80 mg by mouth daily.      gabapentin 300 MG capsule   Commonly known as: NEURONTIN  Take 300 mg by mouth 3 (three) times daily.      isosorbide mononitrate 60 MG 24 hr tablet   Commonly known as: IMDUR   Take 60 mg by mouth every evening. 5pm      lisinopril 2.5 MG tablet   Commonly known as: PRINIVIL,ZESTRIL   Take 1 tablet (2.5 mg total) by mouth daily.      multivitamin with minerals Tabs   Take 1 tablet by mouth daily.      nitroGLYCERIN 0.3 MG SL tablet   Commonly known as: NITROSTAT   Place 0.3 mg under the tongue every 5 (five) minutes as needed. For chest pain      oxyCODONE-acetaminophen 5-325 MG per tablet   Commonly known as: PERCOCET/ROXICET   Take 1-2 tablets by mouth every 6 (six) hours as needed for pain.      pantoprazole 20 MG tablet   Commonly known as: PROTONIX   Take 20 mg by mouth daily.      polyethylene glycol packet   Commonly known as: MIRALAX / GLYCOLAX   Take 17 g by mouth daily.      rosuvastatin 5 MG tablet   Commonly known as: CRESTOR   Take 1 tablet (5 mg total) by mouth every  evening. 5pm      senna 8.6 MG Tabs   Commonly known as: SENOKOT   Take 2 tablets (17.2 mg total) by mouth at bedtime.      traZODone 50 MG tablet   Commonly known as: DESYREL   Take 50 mg by mouth at bedtime.      valACYclovir 1000 MG tablet   Commonly known as: VALTREX   Take 1,000 mg by mouth Once daily as needed. For flare ups           BRIEF HPI:  See H&P, Labs, Consult and Test reports for all details in brief, [name] is a 51/F with PMH of Systolic CHF, COPD, HIV on HAART, has had issues with R lower extremity and foot pain and swelling since early October, she has been treated for cellulitis on 2 separate occasions in October, initially with IV vancomycin and then 2 separate courses of Clindamycin upon discharge 10/10 and Bactrim DS when discharged 10/31 for 5 days, since there wasn't much improvement in the pain and swelling she was sent for Ortho FU today was seen by Dr.Chandler where Xray of the foot/ankle was concerning for osteomyelitis and sent to hospital as a direct admit.  CONSULTATIONS:   ID Ortho  PERTINENT RADIOLOGIC STUDIES: Dg Tibia/fibula Right  07/05/2012  *RADIOLOGY REPORT*  Clinical Data: Increase swelling and pain right lower extremity.  RIGHT TIBIA AND FIBULA - 2 VIEW  Comparison: 07/04/2012  Findings: Right tibia and fibula appear intact.  No malalignment or displaced fracture.  Degenerative changes of the ankle joint noted. Mild diffuse soft tissue swelling.  Patchy osteopenia noted at the ankle.  IMPRESSION: No acute osseous abnormality.  Right ankle degenerative changes and periarticular osteopenia.   Original Report Authenticated By: Judie Petit. Ruel Favors, M.D.    Dg Ankle 2 Views Right  07/04/2012  *RADIOLOGY REPORT*  Clinical Data: Pain post fall.  RIGHT ANKLE - 2 VIEW  Comparison: 06/09/2012  Findings: Calcaneal spur.  Ankle mortise intact. There has been some subchondral resorption suggesting disuse osteopenia. Ankle mortise intact. Negative for  fracture, dislocation, or other acute abnormality.  Normal alignment. No significant degenerative change. Regional soft tissues unremarkable.  IMPRESSION:  No acute abnormality   Original  Report Authenticated By: Osa Craver, M.D.    Mr Foot Right W Wo Contrast  07/28/2012  *RADIOLOGY REPORT*  Clinical Data: Severe foot pain and swelling for 1 month.  MRI OF THE RIGHT FOREFOOT WITHOUT AND WITH CONTRAST  Technique:  Multiplanar, multisequence MR imaging was performed both before and after administration of intravenous contrast.  Contrast: 20mL MULTIHANCE GADOBENATE DIMEGLUMINE 529 MG/ML IV SOLN  Comparison: CT scan dated 06/09/2012  Findings: The patient has diffuse subcutaneous edema of the forefoot particularly on the dorsum of the foot. 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  After contrast administration there is abnormal enhancement of the joint spaces at the fourth and fifth tarsometatarsal joints and at the first metatarsal phalangeal joint.  No appreciable fluid collections.  Findings  are consistent with synovitis of those joints.  No evidence of osteomyelitis.  No visible soft tissue abscesses.  IMPRESSION:  Synovitis at the fourth and fifth tarsometatarsal joints and the first metatarsal phalangeal joint.  Atypical infection should be considered.   Original Report Authenticated By: Francene Boyers, M.D.    Mr Ankle Right W Wo Contrast  07/28/2012  *RADIOLOGY REPORT*  Clinical Data:  Severe right ankle pain and swelling.  MRI OF THE RIGHT ANKLE WITH AND WITHOUT CONTRAST  Technique:  Multiplanar, multisequence MR imaging of the right ankle was performed before and after the administration of intravenous contrast.  Contrast:  20 ml Multihance  Comparison:  Radiographs dated 07/05/2012 and a CT scan dated 06/09/2012  Findings: There is prominent hypertrophy and edema of the synovium of the ankle joint end of the subtalar joint.  There  is patchy edema in the distal tibia and fibula, talus, and calcaneus.  However, there are no discrete fluid collections in the ankle or subtalar joint.  There is also a soft tissue inflammation around the posterior tibialis and flexor digitorum longus and flexor hallucis longus tendons and adjacent to the peroneal tendons at the level of the ankle.  Comparing the osseous structures with the CT scan performed on 06/09/2012, there has been no bone destruction or progression of arthritic changes noted at that time.  The Achilles tendon and plantar fascia are normal.  The anterior tendons are normal.  IMPRESSION: Findings consistent with severe synovitis at the ankle joint and to a lesser degree at the subtalar joint with secondary inflammatory changes in the bones as described.  However, there is no bone destruction to suggest osteomyelitis there is no fluid in the joints to suggest septic joints.  Given the patient's history, the possibility of an atypical synovitis without pus should be considered. Biopsy of the ankle synovium may be useful.   Original Report Authenticated By: Francene Boyers, M.D.    Ir Fluoro Guide Cv Line Left  07/28/2012  *RADIOLOGY REPORT*  PICC PLACEMENT WITH ULTRASOUND AND FLUOROSCOPIC  GUIDANCE  Clinical History: Right foot infection  Fluoroscopy Time: 0.2 minutes.  Procedure:  The left arm was prepped with chlorhexidine, draped in the usual sterile fashion using maximum barrier technique (cap and mask, sterile gown, sterile gloves, large sterile sheet, hand hygiene and cutaneous antiseptic).  Local anesthesia was attained by infiltration with 1% lidocaine.  Ultrasound demonstrated patency of the eft basilic vein, and this was documented with an image.  Under real-time ultrasound guidance, this vein was accessed with a 21 gauge micropuncture needle and image documentation was performed.  The needle was exchanged over a guidewire for a peel-away sheath through  which a 33.5 cm 5 [place] dual  lumen power injectable PICC was advanced, and positioned with its tip at the lower SVC/right atrial junction.  Fluoroscopy during the procedure and fluoro spot radiograph confirms appropriate catheter position.  The catheter was flushed, secured to the skin with Prolene sutures, and covered with a sterile dressing.  Complications:  None.  The patient tolerated the procedure well.  IMPRESSION: Successful placement of a left arm PICC with sonographic and fluoroscopic guidance.  The catheter is ready for use.  Signed,  Sterling Big, MD Vascular & Interventional Radiologist [facility]   Original Report Authenticated By: Malachy Moan, M.D.    Ir US Guide Vasc Access Left  07/28/2012  *RADIOLOGY REPORT*  PICC PLACEMENT WITH ULTRASOUND AND FLUOROSCOPIC  GUIDANCE  Clinical History: Right foot infection  Fluoroscopy Time: 0.2 minutes.  Procedure:  The left arm was prepped with chlorhexidine, draped in the usual sterile fashion using maximum barrier technique (cap and mask, sterile gown, sterile gloves, large sterile sheet, hand hygiene and cutaneous antiseptic).  Local anesthesia was attained by infiltration with 1% lidocaine.  Ultrasound demonstrated patency of the eft basilic vein, and this was documented with an image.  Under real-time ultrasound guidance, this vein was accessed with a 21 gauge micropuncture needle and image documentation was performed.  The needle was exchanged over a guidewire for a peel-away sheath through which a 33.5 cm 5 [place] dual lumen power injectable PICC was advanced, and positioned with its tip at the lower SVC/right atrial junction.  Fluoroscopy during the procedure and fluoro spot radiograph confirms appropriate catheter position.  The catheter was flushed, secured to the skin with Prolene sutures, and covered with a sterile dressing.  Complications:  None.  The patient tolerated the procedure well.  IMPRESSION: Successful placement of a left arm PICC with  sonographic and fluoroscopic guidance.  The catheter is ready for use.  Signed,  Sterling Big, MD Vascular & Interventional Radiologist [facility]   Original Report Authenticated By: Malachy Moan, M.D.    Dg Shoulder Left  07/04/2012  *RADIOLOGY REPORT*  Clinical Data: Pain post fall.  LEFT SHOULDER - 2+ VIEW  Comparison: 02/14/2012  Findings: Negative for fracture, dislocation, or other acute abnormality.  Normal alignment and mineralization. No significant degenerative change.  Regional soft tissues unremarkable.  IMPRESSION:  Negative   Original Report Authenticated By: Osa Craver, M.D.    Dg Foot 2 Views Right  07/04/2012  *RADIOLOGY REPORT*  Clinical Data: Pain post fall  RIGHT FOOT - 2 VIEW  Comparison: 06/06/2012  Findings: Calcaneal spur at the plantar aponeurosis.  Diffuse soft tissue swelling most marked dorsally.  Negative for fracture, dislocation, or other acute bone injury.  Normal mineralization and alignment.  IMPRESSION:  1.  Negative for fracture or other acute bone injury. 2.  Dorsal soft tissue swelling. 3.  Calcaneal spur   Original Report Authenticated By: Osa Craver, M.D.    Dg Fluoro Guide Ndl Plc/bx  07/29/2012  *RADIOLOGY REPORT*  Clinical Data: Right ankle joint injection for synovitis.  FLUORO GUIDED NEEDLE PLACEMENT  Fluoroscopy was provided intraoperatively for Dr. Ave Filter.  Single spot image shows placement of a needle into the lateral right ankle joint.   Original Report Authenticated By: Irish Lack, M.D.      PERTINENT LAB RESULTS: CBC: No results found for this basename: WBC:2,HGB:2,HCT:2,PLT:2 in the last 72 hours CMET CMP     Component Value Date/Time   NA 139 07/30/2012  0640   K 4.2 07/30/2012 0640   CL 103 07/30/2012 0640   CO2 27 07/30/2012 0640   GLUCOSE 120* 07/30/2012 0640   BUN 14 07/30/2012 0640   CREATININE 0.72 08/02/2012 0510   CREATININE 0.86 07/13/2012 1450   CALCIUM 9.6 07/30/2012 0640    PROT 7.4 07/26/2012 1651   ALBUMIN 3.2* 07/26/2012 1651   AST 13 07/26/2012 1651   ALT 8 07/26/2012 1651   ALKPHOS 47 07/26/2012 1651   BILITOT 0.3 07/26/2012 1651   GFRNONAA >90 08/02/2012 0510   GFRAA >90 08/02/2012 0510    GFR Estimated Creatinine Clearance: 106 ml/min (by C-G formula based on Cr of 0.72). No results found for this basename: LIPASE:2,AMYLASE:2 in the last 72 hours  Basename 08/01/12 0540 07/31/12 2225 07/31/12 1629  CKTOTAL -- -- --  CKMB -- -- --  CKMBINDEX -- -- --  TROPONINI <0.30 <0.30 <0.30   No components found with this basename: POCBNP:3 No results found for this basename: DDIMER:2 in the last 72 hours No results found for this basename: HGBA1C:2 in the last 72 hours No results found for this basename: CHOL:2,HDL:2,LDLCALC:2,TRIG:2,CHOLHDL:2,LDLDIRECT:2 in the last 72 hours No results found for this basename: TSH,T4TOTAL,FREET3,T3FREE,THYROIDAB in the last 72 hours No results found for this basename: VITAMINB12:2,FOLATE:2,FERRITIN:2,TIBC:2,IRON:2,RETICCTPCT:2 in the last 72 hours Coags: No results found for this basename: PT:2,INR:2 in the last 72 hours Microbiology: Recent Results (from the past 240 hour(s))  MRSA PCR SCREENING     Status: Normal   Collection Time   07/26/12  5:04 PM      Component Value Range Status Comment   MRSA by PCR NEGATIVE  NEGATIVE Final   SURGICAL PCR SCREEN     Status: Normal   Collection Time   07/29/12  2:37 AM      Component Value Range Status Comment   MRSA, PCR NEGATIVE  NEGATIVE Final    Staphylococcus aureus NEGATIVE  NEGATIVE Final      BRIEF HOSPITAL COURSE:    Active Problems:  RLE severe synovitis -patient was admitted, there was suspicion for osteomyelitis, antibiotics were never started as patient was not septic and plans were to obtain a bone biopsy first. Patient underwent a MRI of the left foot/ankle, which did not show osteo, but showed severe synovitis involving the ankle and the subtalar  joint. Because patient did not have fever or leukocytosis, it was thought this was inflammatory. Ortho did try twice to aspirate the fluid, however was unsuccessful. Ultimately it was decided that patient would benefit from intra-articular steroid injection, this was done 11/23 in the OR. Following intra-articular steroid injection, there has been significant improvement in the patient' swelling and pain. Patient is stable to be discharged today.She will f/u ortho as outpatient.  Chest Pain -developed chest pain 11/25 -EKG showed new T wave inversions in the ant leads-but per cards these are old -Enzymes cycled-negative -Nuclear stress testing was done on 11/26 and 08/02/2012 and showed no changes from previous findings. 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  HIV  - last CD4 450 in 11/13 and Viral load undetectable, continue HAART   Anemia  -suspect 2/2 chronic disease  -claims to have occassional hemorrhoidal bleeds -further w/u deferred to the outpatient setting  COPD  -stable, PRN nebs   CAD/CHF (systolic)  - stable, compensated, continue plavix/coreg  -no obvious contra-indications for ACEI-start low dose Lisinopril -last Echo on 12/2011-EF of 25 to 30 %  HTN  -controlled with Coreg, Imdur  and now adding Lisinopril   H/o CVA  -on Plavix and Statins  TODAY-DAY OF DISCHARGE:  Subjective:   Schwanda Zima today has no headache,no chest abdominal pain,no new weakness tingling or numbness, feels much better wants to go home today.   Objective:   Blood pressure 119/75, pulse 88, temperature 97.8 F (36.6 C), temperature source Oral, resp. rate 20, height 5\' 2"  (1.575 m), weight 126.6 kg (279 lb 1.6 oz), last menstrual period 05/14/2011, SpO2 100.00%.  Intake/Output Summary (Last 24 hours) at 08/02/12 1457 Last data filed at 08/01/12 2100  Gross per 24 hour  Intake    400 ml  Output      0 ml  Net    400 ml    Exam Awake Alert,  Oriented *3, No new F.N deficits, Normal affect [place].AT,PERRAL Supple Neck,No JVD, No cervical lymphadenopathy appriciated.  Symmetrical Chest wall movement, Good air movement bilaterally, CTAB RRR,No Gallops,Rubs or new Murmurs, No Parasternal Heave +ve B.Sounds, Abd Soft, Non tender, No organomegaly appriciated, No rebound -guarding or rigidity. No Cyanosis, Clubbing or edema, No new Rash or bruise  DISCHARGE CONDITION: Stable  DISPOSITION: SNF  DISCHARGE INSTRUCTIONS:    Activity:  As tolerated with Full fall precautions use walker/cane & assistance as needed-WBAT as tolerated  Diet recommendation: Heart Healthy diet  Follow-up Information    Follow up with Mable Paris, MD. In 2 weeks.   Contact information:   [address] [place] [address] [phone]       Follow up with [facility], MD. Schedule an appointment as soon as possible for a visit in 2 weeks.   Contact information:   [address] [place] [address] [phone]       Follow up with Staci Righter, MD. Schedule an appointment as soon as possible for a visit in 2 weeks.   Contact information:   [address] [place] [address] [phone]       Follow up with Charlton Haws, MD. In 2 weeks.   Contact information:   [address] [place] [address] [phone]         Total Time spent on discharge equals 45 minutes.  SignedClydia Llano A 08/02/2012 2:57 PM

## 2012-08-02 NOTE — Progress Notes (Signed)
    I have seen and examined the patient. I agree with the above note with the addition of : overall atypical chest pain but with known history of CAD and systolic heart failure. She had a myocardial perfusion stress test done but results are still pending. If no significant ischemia, can discharge home from a cardiac standpoint with follow up in cardiology in 2 weeks.   Lorine Bears MD, [facility] 08/02/2012 1:06 PM

## 2012-08-07 ENCOUNTER — Telehealth: Payer: Self-pay | Admitting: Licensed Clinical Social Worker

## 2012-08-07 NOTE — Telephone Encounter (Signed)
Patient cancelled her hospital follow up because she felt her recent hospitalization was not due to infectious disease. Patient states it was due to her foot.

## 2012-08-09 ENCOUNTER — Encounter (HOSPITAL_COMMUNITY): Payer: Medicare PPO

## 2012-08-17 ENCOUNTER — Inpatient Hospital Stay: Payer: Medicare PPO | Admitting: Internal Medicine

## 2012-10-11 ENCOUNTER — Other Ambulatory Visit: Payer: Self-pay

## 2012-10-12 ENCOUNTER — Other Ambulatory Visit: Payer: Medicare PPO

## 2012-10-12 ENCOUNTER — Other Ambulatory Visit (HOSPITAL_COMMUNITY)
Admission: RE | Admit: 2012-10-12 | Discharge: 2012-10-12 | Disposition: A | Discharge status: Home / Self Care | Payer: Medicare PPO | Source: Ambulatory Visit | Attending: Infectious Diseases | Admitting: Infectious Diseases

## 2012-10-12 ENCOUNTER — Ambulatory Visit (INDEPENDENT_AMBULATORY_CARE_PROVIDER_SITE_OTHER): Payer: Medicare PPO | Admitting: *Deleted

## 2012-10-12 DIAGNOSIS — Z0000 Encounter for general adult medical examination without abnormal findings: Secondary | ICD-10-CM

## 2012-10-12 DIAGNOSIS — Z1231 Encounter for screening mammogram for malignant neoplasm of breast: Secondary | ICD-10-CM

## 2012-10-12 DIAGNOSIS — Z124 Encounter for screening for malignant neoplasm of cervix: Secondary | ICD-10-CM

## 2012-10-12 DIAGNOSIS — B20 Human immunodeficiency virus [HIV] disease: Secondary | ICD-10-CM

## 2012-10-12 LAB — COMPLETE METABOLIC PANEL WITH GFR
ALT: 9 U/L (ref 0–35)
Albumin: 3.7 g/dL (ref 3.5–5.2)
CO2: 29 mEq/L (ref 19–32)
Calcium: 9.3 mg/dL (ref 8.4–10.5)
Chloride: 106 mEq/L (ref 96–112)
GFR, Est African American: >89 mL/min
GFR, Est Non African American: >89 mL/min
Glucose, Bld: 94 mg/dL (ref 70–99)
Potassium: 4 mEq/L (ref 3.5–5.3)
Sodium: 141 mEq/L (ref 135–145)
Total Bilirubin: 0.4 mg/dL (ref 0.3–1.2)
Total Protein: 6.5 g/dL (ref 6.0–8.3)

## 2012-10-12 LAB — CBC WITH DIFFERENTIAL/PLATELET
Hemoglobin: 10.7 g/dL — ABNORMAL LOW (ref 12.0–15.0)
Lymphocytes Relative: 38 % (ref 12–46)
Lymphs Abs: 1.7 10*3/uL (ref 0.7–4.0)
Monocytes Relative: 12 % (ref 3–12)
Neutrophils Relative %: 48 % (ref 43–77)
Platelets: 290 10*3/uL (ref 150–400)
RBC: 4.42 MIL/uL (ref 3.87–5.11)
WBC: 4.5 10*3/uL (ref 4.0–10.5)

## 2012-10-12 NOTE — Patient Instructions (Signed)
  Your results will be ready in about a week.  You may look them up on MyChart.  Thank you for coming to the Center for your care.  Denise, RN 

## 2012-10-12 NOTE — Progress Notes (Signed)
  Subjective:     [name] is a 52 y.o. woman who comes in today for a  pap smear only.  LMP 05/14/2011 Pelvic Exam:  Pap smear obtained.   Assessment:    Screening pap smear.   Plan:    Follow up in one year, or as indicated by Pap results. Pt given educational materials re: HIV and women, BSE, self-esteem, PAP smear, heart disease and nutrition. Pt offered condoms.  Made appointment to see [facility] Nutritionist to discuss weight management and nutrition starting in March.

## 2012-10-13 ENCOUNTER — Ambulatory Visit (INDEPENDENT_AMBULATORY_CARE_PROVIDER_SITE_OTHER): Payer: Medicare PPO | Admitting: Cardiovascular Disease

## 2012-10-13 ENCOUNTER — Encounter: Payer: Self-pay | Admitting: Cardiovascular Disease

## 2012-10-13 VITALS — BP 138/90 | HR 82 | Wt 273.0 lb

## 2012-10-13 DIAGNOSIS — E785 Hyperlipidemia, unspecified: Secondary | ICD-10-CM

## 2012-10-13 DIAGNOSIS — I509 Heart failure, unspecified: Secondary | ICD-10-CM

## 2012-10-13 DIAGNOSIS — B20 Human immunodeficiency virus [HIV] disease: Secondary | ICD-10-CM

## 2012-10-13 DIAGNOSIS — I5022 Chronic systolic (congestive) heart failure: Secondary | ICD-10-CM

## 2012-10-13 DIAGNOSIS — I10 Essential (primary) hypertension: Secondary | ICD-10-CM

## 2012-10-13 DIAGNOSIS — M79609 Pain in unspecified limb: Secondary | ICD-10-CM

## 2012-10-13 DIAGNOSIS — M79604 Pain in right leg: Secondary | ICD-10-CM

## 2012-10-13 LAB — HIV-1 RNA QUANT-NO REFLEX-BLD: HIV 1 RNA Quant: <20 copies/mL (ref <20)

## 2012-10-13 MED ORDER — LISINOPRIL 5 MG PO TABS: 5.0000 mg | ORAL_TABLET | Freq: Every day | ORAL | Status: DC

## 2012-10-13 NOTE — Assessment & Plan Note (Signed)
Euvolemic Not a great candidate for AICD with HIV and osteomyelitis.  Continue medical Rx  ? HIV DCM

## 2012-10-13 NOTE — Assessment & Plan Note (Signed)
Have made her a f/u appt with Dr Ave Filter to address issues of her osteopmyelitis

## 2012-10-13 NOTE — Assessment & Plan Note (Signed)
Cholesterol is at goal.  Continue current dose of statin and diet Rx.  No myalgias or side effects.  F/U  LFT's in 6 months. Lab Results  Component Value Date   LDLCALC 98 06/13/2012

## 2012-10-13 NOTE — Progress Notes (Signed)
Patient ID: [name], female   DOB: Sep 03, 1961, 52 y.o.   MRN: [id number] 52 yo patient previously seen by Dr Donnie Aho. History of CAD with stent at [facility] long time ago ? More recent stent at [facility] in 2008. No recent chest pain. In hospital 4/13  With TIA. TEE by Dr Jens Som with small PFO no SOE. Has been on good Rx for BP and cholesterol Dr Blima Singer office follows primary needs. Finished rehab and has mild Dysarthria. No chest pain, dyspnea palpitations. Just had monitor from [employer] taken off Placed by Pearlean Brownie probably to R/O occult PAF. TEE showed  Study Conclusions  - Left ventricle: Systolic function was severely reduced. The estimated ejection fraction was in the range of 25% to 30%. Akinesis of the anteroseptal myocardium. Akinesis of the distal inferior myocardium. Akinesis of the apical myocardium. - Aortic valve: Trivial regurgitation. - Mitral valve: Mild regurgitation. - Left atrium: The atrium was mildly dilated. No evidence of thrombus in the atrial cavity or appendage. - Atrial septum: No defect or patent foramen ovale was identified.  Hospitalized in November for osteomyelitis and had atypical pain.  Seen by Dr Tenny Craw who did not feel that anything was needed before discharge. HIV with CD 4 count over 400 in November and just had repeat blood work  Needs f/u with Dr Ave Filter for osteo Not on anitbiotics now  ROS: Denies fever, malais, weight loss, blurry vision, decreased visual acuity, cough, sputum, SOB, hemoptysis, pleuritic pain, palpitaitons, heartburn, abdominal pain, melena, lower extremity edema, claudication, or rash.  All other systems reviewed and negative  General: Affect appropriate Overweight black female HEENT: normal Neck supple with no adenopathy JVP normal no bruits no thyromegaly Lungs clear with no wheezing and good diaphragmatic motion Heart:  S1/S2 no murmur, no rub, gallop or click PMI normal Abdomen: benighn, BS positve, no tenderness, no AAA no  bruit.  No HSM or HJR Distal pulses intact with no bruits Bilateal edema Neuro non-focal Skin warm and dry No muscular weakness   Current Outpatient Prescriptions  Medication Sig Dispense Refill  . acetaminophen (TYLENOL) 500 MG tablet Take 2 tablets (1,000 mg total) by mouth 3 (three) times daily.  30 tablet    . albuterol (PROVENTIL HFA;VENTOLIN HFA) 108 (90 BASE) MCG/ACT inhaler Inhale 2 puffs into the lungs every 6 (six) hours as needed. For wheezing and shortness of breath      . albuterol (PROVENTIL) (5 MG/ML) 0.5% nebulizer solution Take 2.5 mg by nebulization every 2 (two) hours as needed. For shortness of breath      . aspirin 81 MG EC tablet Take 1 tablet (81 mg total) by mouth daily. Swallow whole.  30 tablet  12  . bisacodyl (DULCOLAX) 5 MG EC tablet Take 10 mg by mouth daily as needed. For constipation      . carvedilol (COREG) 3.125 MG tablet Take 3.125 mg by mouth 2 (two) times daily with a meal. 9am and 6pm      . clopidogrel (PLAVIX) 75 MG tablet Take 75 mg by mouth daily.      . diphenoxylate-atropine (LOMOTIL) 2.5-0.025 MG per tablet Take 1 tablet by mouth 4 (four) times daily as needed. For loose stool      . elvitegravir-cobicistat-emtricitabine-tenofovir (STRIBILD) 150-150-200-300 MG TABS Take 1 tablet by mouth daily with breakfast.      . gabapentin (NEURONTIN) 300 MG capsule Take 300 mg by mouth 3 (three) times daily.      . isosorbide mononitrate (IMDUR) 60 MG  24 hr tablet Take 60 mg by mouth every evening. 5pm      . lisinopril (PRINIVIL,ZESTRIL) 2.5 MG tablet Take 1 tablet (2.5 mg total) by mouth daily.      . Multiple Vitamin (MULTIVITAMIN WITH MINERALS) TABS Take 1 tablet by mouth daily.      . nitroGLYCERIN (NITROSTAT) 0.3 MG SL tablet Place 0.3 mg under the tongue every 5 (five) minutes as needed. For chest pain      . oxyCODONE-acetaminophen (ROXICET) 5-325 MG per tablet Take 1-2 tablets by mouth every 6 (six) hours as needed for pain.  60 tablet  0  .  pantoprazole (PROTONIX) 20 MG tablet Take 20 mg by mouth daily.       . polyethylene glycol (MIRALAX / GLYCOLAX) packet Take 17 g by mouth daily.  14 each    . rosuvastatin (CRESTOR) 5 MG tablet Take 1 tablet (5 mg total) by mouth every evening. 5pm      . senna (SENOKOT) 8.6 MG TABS Take 2 tablets (17.2 mg total) by mouth at bedtime.  120 each    . traZODone (DESYREL) 50 MG tablet Take 50 mg by mouth at bedtime.      . valACYclovir (VALTREX) 1000 MG tablet Take 1,000 mg by mouth Once daily as needed. For flare ups        Allergies  Atorvastatin  Electrocardiogram:  08/04/12  SR anterolateral T wave changes no change from 2012  Assessment and Plan

## 2012-10-13 NOTE — Addendum Note (Signed)
Addended by: Scherrie Bateman E on: 10/13/2012 11:49 AM   Modules accepted: Orders

## 2012-10-13 NOTE — Assessment & Plan Note (Signed)
F/U ID clinic  Continue antiretroviral Rx  CD 4 counts ok

## 2012-10-13 NOTE — Patient Instructions (Addendum)
Your physician wants you to follow-up in:   6 MONTHS WITH DR Haywood Filler will receive a reminder letter in the mail two months in advance. If you don't receive a letter, please call our office to schedule the follow-up appointment.  Your physician has recommended you make the following change in your medication:   INCREASE LISINOPRIL  TO 5 MG EVERY DAY   APPT MADE WITH DR [facility]  FOR  10-16-12  AT 9:15

## 2012-10-13 NOTE — Assessment & Plan Note (Signed)
Increase lisinopril to 5 mg continue attempts at weight loss

## 2012-10-17 ENCOUNTER — Encounter: Payer: Self-pay | Admitting: *Deleted

## 2012-10-25 ENCOUNTER — Encounter: Payer: Self-pay | Admitting: Neurology

## 2012-10-26 ENCOUNTER — Encounter: Payer: Self-pay | Admitting: Internal Medicine

## 2012-10-26 ENCOUNTER — Ambulatory Visit (INDEPENDENT_AMBULATORY_CARE_PROVIDER_SITE_OTHER): Payer: Medicare PPO | Admitting: Internal Medicine

## 2012-10-26 VITALS — BP 145/82 | HR 92 | Temp 97.8°F | Ht 61.0 in | Wt 274.0 lb

## 2012-10-26 DIAGNOSIS — B20 Human immunodeficiency virus [HIV] disease: Secondary | ICD-10-CM

## 2012-10-26 DIAGNOSIS — M79604 Pain in right leg: Secondary | ICD-10-CM

## 2012-10-26 DIAGNOSIS — M79609 Pain in unspecified limb: Secondary | ICD-10-CM

## 2012-10-26 MED ORDER — OXYCODONE-ACETAMINOPHEN 5-325 MG PO TABS: 1.0000 | ORAL_TABLET | Freq: Three times a day (TID) | ORAL | Status: DC | PRN

## 2012-10-26 NOTE — Assessment & Plan Note (Signed)
She continues to have significant pain. She has not yet established with a primary physician nor has she seen an orthopedist recently. She does have followup now with orthopedics in about 2 weeks and will be scheduled for primary physician. I did give her a one time prescription for Percocet and she is aware this will not be refilled by me at any time. She did receive enough to give her time to establish a primary physician.

## 2012-10-26 NOTE — Assessment & Plan Note (Signed)
She continues to do well with her HIV. She'll continue with the same medication and return in 4 months for routine followup.

## 2012-10-26 NOTE — Progress Notes (Signed)
  Subjective:    Patient ID: [name], female    DOB: 1961-06-06, 52 y.o.   MRN: [id number]  HPI She comes in here for followup of her HIV. She continues to take Stribild and has no missed doses and good tolerance of the medication. She continues to complain of hip pain and right ankle pain which has been an ongoing problem. She has not yet established with a primary care physician. She is scheduled to see an orthopedist in 2 weeks. She is tearful today.    Review of Systems  Constitutional: Negative for fever and fatigue.  Respiratory: Negative for cough and shortness of breath.   Musculoskeletal: Positive for myalgias and arthralgias. Negative for joint swelling and gait problem.       Objective:   Physical Exam  Constitutional: She appears well-developed and well-nourished.  Morbidly obese  Cardiovascular: Normal rate, regular rhythm and normal heart sounds.  Exam reveals no gallop and no friction rub.   No murmur heard. Pulmonary/Chest: Effort normal and breath sounds normal. No respiratory distress. She has no wheezes. She has no rales.          Assessment & Plan:

## 2012-10-30 ENCOUNTER — Telehealth: Payer: Self-pay | Admitting: *Deleted

## 2012-10-30 NOTE — Telephone Encounter (Signed)
Called patient and notified her of appt at [facility] for 11/10/12 at 9:45 AM. [name]

## 2012-11-06 ENCOUNTER — Ambulatory Visit: Payer: Medicare PPO

## 2012-11-09 ENCOUNTER — Ambulatory Visit: Payer: Medicare PPO | Attending: Anesthesiology | Admitting: Physical Therapy

## 2012-11-09 ENCOUNTER — Ambulatory Visit: Payer: Medicare PPO

## 2012-11-09 DIAGNOSIS — M6281 Muscle weakness (generalized): Secondary | ICD-10-CM | POA: Insufficient documentation

## 2012-11-09 DIAGNOSIS — IMO0001 Reserved for inherently not codable concepts without codable children: Secondary | ICD-10-CM | POA: Insufficient documentation

## 2012-11-09 DIAGNOSIS — R269 Unspecified abnormalities of gait and mobility: Secondary | ICD-10-CM | POA: Insufficient documentation

## 2012-11-10 ENCOUNTER — Ambulatory Visit: Payer: Medicare PPO | Admitting: Internal Medicine

## 2012-11-14 ENCOUNTER — Ambulatory Visit: Payer: Medicare PPO | Admitting: Physical Therapy

## 2012-11-15 ENCOUNTER — Encounter: Payer: Self-pay | Admitting: Internal Medicine

## 2012-11-15 ENCOUNTER — Ambulatory Visit: Payer: Medicare PPO | Admitting: Internal Medicine

## 2012-11-15 ENCOUNTER — Ambulatory Visit (INDEPENDENT_AMBULATORY_CARE_PROVIDER_SITE_OTHER): Payer: Medicaid Other | Admitting: Internal Medicine

## 2012-11-15 VITALS — BP 142/93 | HR 91 | Temp 96.9°F | Ht 62.0 in | Wt 276.4 lb

## 2012-11-15 DIAGNOSIS — I2510 Atherosclerotic heart disease of native coronary artery without angina pectoris: Secondary | ICD-10-CM

## 2012-11-15 DIAGNOSIS — I509 Heart failure, unspecified: Secondary | ICD-10-CM

## 2012-11-15 DIAGNOSIS — E669 Obesity, unspecified: Secondary | ICD-10-CM

## 2012-11-15 DIAGNOSIS — J4489 Other specified chronic obstructive pulmonary disease: Secondary | ICD-10-CM

## 2012-11-15 DIAGNOSIS — J449 Chronic obstructive pulmonary disease, unspecified: Secondary | ICD-10-CM

## 2012-11-15 DIAGNOSIS — I5022 Chronic systolic (congestive) heart failure: Secondary | ICD-10-CM

## 2012-11-15 DIAGNOSIS — I10 Essential (primary) hypertension: Secondary | ICD-10-CM

## 2012-11-15 DIAGNOSIS — M545 Low back pain, unspecified: Secondary | ICD-10-CM

## 2012-11-15 DIAGNOSIS — E785 Hyperlipidemia, unspecified: Secondary | ICD-10-CM

## 2012-11-15 DIAGNOSIS — G4730 Sleep apnea, unspecified: Secondary | ICD-10-CM

## 2012-11-15 DIAGNOSIS — D649 Anemia, unspecified: Secondary | ICD-10-CM

## 2012-11-15 DIAGNOSIS — I639 Cerebral infarction, unspecified: Secondary | ICD-10-CM

## 2012-11-15 DIAGNOSIS — G8929 Other chronic pain: Secondary | ICD-10-CM
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
## 2012-11-15 NOTE — Patient Instructions (Addendum)
1. Keep taking all of your medications. 2. You will be called with appointments for sleep study and to meet with our nutritionist, Norm Parcel. 3. Come back and see me in a month to discuss lab tests and other issues.  It was a pleasure to meet you!

## 2012-11-15 NOTE — Assessment & Plan Note (Addendum)
Lab Results  Component Value Date   WBC 4.5 10/12/2012   HGB 10.7* 10/12/2012   HCT 34.5* 10/12/2012   MCV 78.1 10/12/2012   PLT 290 10/12/2012  Patient with records consistent with borderline microcytic anemia, has been iron deficient in the past. Reports bleeding from external hemorrhoids but denies other GI source of bleeding. Will recheck iron today, will likely need supplementation. Will need colonoscopy at any rate for screening purposes, will make referral at next appointment in one month.   Lab Results  Component Value Date   IRON 26* 11/15/2012   TIBC 407 11/15/2012   FERRITIN 12 11/15/2012   Iron is low. Will replace ferrous sulfate TID and plan to schedule colonoscopy at next visit. Called in Rx to pharmacy, left msg for patient on voicemail to notify.

## 2012-11-15 NOTE — Assessment & Plan Note (Addendum)
Patient has chronic low back pain for which she takes percocet. This is managed by the pain clinic; as such, I told her I would not be able to prescribe her narcotics as she has contract with pain clinic. No new/alarm symptoms.  Patient reports that before her next month narcotic Rx can be filled, she needs a sleep study. She does have some symptomatology concerning for OSA vs OHS. Sleep study referral made.

## 2012-11-15 NOTE — Assessment & Plan Note (Addendum)
Morbid obesidty with BMI 50.5. Interested in exercise, frustrated about limitations of chronic pain. Says she's not eating a very healthy diet. She is in interested in losing weight, and would like to meet with Lupita Leash Plyler to discuss lifestyle modification strategies. At this time due to multiple uncontrolled comorbidities, she would likely be a good candidate for bariatric surgery, however should her problems improve this may be the best option for her. -Refer to nutritionist

## 2012-11-15 NOTE — Assessment & Plan Note (Signed)
Followed by Dr. Eden Emms. Does not appear to be volume overloaded today. -Continue current therapies, cardiology followup

## 2012-11-15 NOTE — Assessment & Plan Note (Signed)
Lab Results  Component Value Date   CHOL 156 06/13/2012   HDL 37* 06/13/2012   LDLCALC 98 06/13/2012   TRIG 106 06/13/2012   CHOLHDL 4.2 06/13/2012   At goal. Continue Crestor. Repeat LFTs in 5 months

## 2012-11-15 NOTE — Assessment & Plan Note (Signed)
Well-controlled this time, only occasional use of albuterol inhaler.

## 2012-11-15 NOTE — Assessment & Plan Note (Signed)
Lab Results  Component Value Date   NA 141 10/12/2012   K 4.0 10/12/2012   CL 106 10/12/2012   CO2 29 10/12/2012   BUN 9 10/12/2012   CREATININE 0.71 10/12/2012   CREATININE 0.72 08/02/2012    BP Readings from Last 3 Encounters:  11/15/12 142/93  10/26/12 145/82  10/25/12 106/74    Assessment: Hypertension control:  mildly elevated  Progress toward goals:  unchanged Barriers to meeting goals:  no barriers identified  Plan: Hypertension treatment:  continue current medications Lisinopril, carvedilol, Imdur, HCTZ

## 2012-11-15 NOTE — Assessment & Plan Note (Signed)
Continues on ASA, follows up with Dr. Pearlean Brownie. No residual deficits.

## 2012-11-15 NOTE — Assessment & Plan Note (Signed)
History of stents and recent stenting of LAD. On Plavix and aspirin. No angina reported. - continue aspirin, Plavix, beta blocker - Cardiology followup

## 2012-11-16 LAB — FERRITIN: Ferritin: 12 ng/mL (ref 10–291)

## 2012-11-16 LAB — IRON AND TIBC
%SAT: 6 % — ABNORMAL LOW (ref 20–55)
TIBC: 407 ug/dL (ref 250–470)

## 2012-11-16 MED ORDER — FERROUS SULFATE 325 (65 FE) MG PO TABS: 325.0000 mg | ORAL_TABLET | Freq: Three times a day (TID) | ORAL | Status: DC

## 2012-11-16 NOTE — Addendum Note (Signed)
Addended by: Ignacia Palma on: 11/16/2012 01:20 PM   Modules accepted: Orders

## 2012-11-17 ENCOUNTER — Ambulatory Visit: Payer: Medicare PPO | Admitting: Physical Therapy

## 2012-11-20 ENCOUNTER — Ambulatory Visit: Payer: Medicare PPO | Admitting: Physical Therapy

## 2012-11-23 ENCOUNTER — Ambulatory Visit: Payer: Medicare PPO | Admitting: Physical Therapy

## 2012-11-28 ENCOUNTER — Ambulatory Visit: Payer: Self-pay | Admitting: Neurology

## 2012-11-29 ENCOUNTER — Ambulatory Visit: Payer: Medicare PPO | Admitting: Physical Therapy

## 2012-12-01 ENCOUNTER — Ambulatory Visit: Payer: Medicare PPO | Admitting: Physical Therapy

## 2012-12-04 ENCOUNTER — Ambulatory Visit: Payer: Medicare PPO | Admitting: Physical Therapy

## 2012-12-04 ENCOUNTER — Telehealth: Payer: Self-pay | Admitting: *Deleted

## 2012-12-04 NOTE — Telephone Encounter (Signed)
Pt calls and states she needs her pain med, she has been released from pain management because she cannot afford $45.00 for visit also she started having cramps in feet this am and thinks her K+ is low. appt given for 4/1 at 1345 dr ziemer

## 2012-12-05 ENCOUNTER — Encounter: Payer: Self-pay | Admitting: Internal Medicine

## 2012-12-05 ENCOUNTER — Ambulatory Visit (INDEPENDENT_AMBULATORY_CARE_PROVIDER_SITE_OTHER): Payer: Medicaid Other | Admitting: Internal Medicine

## 2012-12-05 ENCOUNTER — Telehealth: Payer: Self-pay | Admitting: *Deleted

## 2012-12-05 VITALS — BP 137/93 | HR 99 | Temp 98.0°F | Ht 62.0 in | Wt 274.2 lb

## 2012-12-05 DIAGNOSIS — M1710 Unilateral primary osteoarthritis, unspecified knee: Secondary | ICD-10-CM

## 2012-12-05 DIAGNOSIS — Z79899 Other long term (current) drug therapy: Secondary | ICD-10-CM

## 2012-12-05 DIAGNOSIS — M1712 Unilateral primary osteoarthritis, left knee: Secondary | ICD-10-CM

## 2012-12-05 DIAGNOSIS — I509 Heart failure, unspecified: Secondary | ICD-10-CM

## 2012-12-05 DIAGNOSIS — Z1211 Encounter for screening for malignant neoplasm of colon: Secondary | ICD-10-CM

## 2012-12-05 DIAGNOSIS — E8881 Metabolic syndrome: Secondary | ICD-10-CM

## 2012-12-05 DIAGNOSIS — G894 Chronic pain syndrome: Secondary | ICD-10-CM | POA: Insufficient documentation

## 2012-12-05 DIAGNOSIS — G4733 Obstructive sleep apnea (adult) (pediatric): Secondary | ICD-10-CM

## 2012-12-05 DIAGNOSIS — I5022 Chronic systolic (congestive) heart failure: Secondary | ICD-10-CM

## 2012-12-05 DIAGNOSIS — D649 Anemia, unspecified: Secondary | ICD-10-CM

## 2012-12-05 DIAGNOSIS — I10 Essential (primary) hypertension: Secondary | ICD-10-CM

## 2012-12-05 DIAGNOSIS — M545 Low back pain: Secondary | ICD-10-CM

## 2012-12-05 DIAGNOSIS — M109 Gout, unspecified: Secondary | ICD-10-CM

## 2012-12-05 LAB — URIC ACID: Uric Acid, Serum: 4.1 mg/dL (ref 2.4–7.0)

## 2012-12-05 MED ORDER — OXYCODONE-ACETAMINOPHEN 5-325 MG PO TABS: 1.0000 | ORAL_TABLET | Freq: Four times a day (QID) | ORAL | Status: DC | PRN

## 2012-12-05 MED ORDER — CARVEDILOL 3.125 MG PO TABS: 3.1250 mg | ORAL_TABLET | Freq: Two times a day (BID) | ORAL | Status: DC

## 2012-12-05 MED ORDER — ISOSORBIDE MONONITRATE ER 60 MG PO TB24: 60.0000 mg | ORAL_TABLET | Freq: Every evening | ORAL | Status: DC

## 2012-12-05 NOTE — Patient Instructions (Addendum)
1. Please keep taking all of your meds. I will call you about your lab results and your potassium. 2. Please follow-up with your orthopedics doctor. 3. You will be called to schedule a colonoscopy. This is VERY IMPORTANT to evaluate your bleeding and look for colon cancer.

## 2012-12-05 NOTE — Assessment & Plan Note (Signed)
BP Readings from Last 3 Encounters:  12/05/12 137/93  11/15/12 142/93  10/26/12 145/82    Lab Results  Component Value Date   NA 141 10/12/2012   K 4.0 10/12/2012   CREATININE 0.71 10/12/2012    Assessment:  Blood pressure control: controlled  Progress toward BP goal:  at goal   Plan:  Medications:  continue current medications  Educational resources provided: brochure;video   Carvedilol 3.125 twice a day, hydrochlorothiazide 25 once a day, isosorbide mononitrate 60 mg, lisinopril 5 mg

## 2012-12-05 NOTE — Addendum Note (Signed)
Addended by: Neomia Dear on: 12/05/2012 05:29 PM   Modules accepted: Orders

## 2012-12-05 NOTE — Telephone Encounter (Signed)
Call to [facility] to request last office visits.   Message left on line to call the Clinics[facility] as soon as possible about office visit there.  Angelina Ok, RN 12/05/2012 11:46 AM.

## 2012-12-05 NOTE — Assessment & Plan Note (Signed)
Appears euvolemic on exam. No chest pain or dyspnea. Has followup in a few months with cardiologist.

## 2012-12-05 NOTE — Assessment & Plan Note (Signed)
She has metabolic syndrome and evidence of glucose intolerance with last known A1c 6.2. Discussed with her the risk of progressing to of her diabetes especially in the setting of her multiple other medical problems and cardiovascular disease. She is eager to talk to [facility] about lifestyle changes that she can make to help limit her risk of progressing to overt diabetes mellitus. We will make her an appointment to followup with [facility] in the next week or 2. We'll follow A1c next visit.

## 2012-12-05 NOTE — Assessment & Plan Note (Addendum)
Says she has been diagnosed with gout in the past. No active joint inflammation or swelling. Not on any uric acid lowering drugs. -Will check uric acid level today just for a baseline. No acute flare.  - eventually will need crystal diagnosis if she has suspected flare  ADDENDUM 12/06/2012] Uric acid 4.1, no therapy indicated.

## 2012-12-05 NOTE — Assessment & Plan Note (Signed)
She has terminated her relationship with the pain clinic due to inability to afford $45 co-pay. She brought with her a letter today from the pain clinic documenting her request for dismissal and confirmation of cessation of narcotic prescriptions. I interrogated the [facility] narcotics database, and it appears that [name] is appropriately filling Percocet one month at a time. She does have history substance abuse the past. I contracted her for Percocet 5/325 #90 per month for her severe osteoarthritis of her left knee.  She says that she will require a total knee replacement of her left knee.I have requested records from her orthopedist. I'm not sure that she is actually a realistic surgical candidate.

## 2012-12-05 NOTE — Addendum Note (Signed)
Addended by: Neomia Dear on: 12/05/2012 05:25 PM   Modules accepted: Orders

## 2012-12-05 NOTE — Assessment & Plan Note (Addendum)
Lab Results  Component Value Date   WBC 4.5 10/12/2012   HGB 10.7* 10/12/2012   HCT 34.5* 10/12/2012   MCV 78.1 10/12/2012   PLT 290 10/12/2012   Lab Results  Component Value Date   IRON 26* 11/15/2012   TIBC 407 11/15/2012   FERRITIN 12 11/15/2012     Patient is anemic with iron deficiency a borderline low MCV. I prescribed iron replacement for her at her last visit.Has been FOBT positive in the past, so she has hemorrhoids. She is due for screening colonoscopy. Did not attend her last GI appointment. Maybe referral today, stressed the importance of followup. She agreed to followup.

## 2012-12-05 NOTE — Assessment & Plan Note (Addendum)
She has severe multi-compartment osteoarthritis of left knee as evidenced by radiograph in June 2013. She sees [facility] (Dr. Luiz Blare), last visit in June 2013. She's told she will require a total knee replacement. I have contracted her for Percocet for pain management (see assessment following). In the meantime, have requested records from her orthopedist.

## 2012-12-05 NOTE — Assessment & Plan Note (Signed)
No new warning signs or symptoms. Treatment with Percocet as per above.

## 2012-12-05 NOTE — Assessment & Plan Note (Signed)
She has symptoms of obstructive sleep apnea. Sleep study was ordered but not completed. We have reentered it and someone will call her to schedule.

## 2012-12-05 NOTE — Assessment & Plan Note (Signed)
As previously referred by her ID doctor. Patient did not attend scheduled appointment. -Referral made today. Told that she be called with appointment, discussed consequences of missing more than 1 one appointment
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
## 2012-12-06 LAB — BASIC METABOLIC PANEL WITH GFR
Chloride: 103 mEq/L (ref 96–112)
Creat: 0.68 mg/dL (ref 0.50–1.10)
GFR, Est Non African American: >89 mL/min
Sodium: 141 mEq/L (ref 135–145)

## 2012-12-06 LAB — PRESCRIPTION ABUSE MONITORING 15P, URINE
Amphetamine/Meth: NEGATIVE ng/mL
Benzodiazepine Screen, Urine: NEGATIVE ng/mL
Buprenorphine, Urine: NEGATIVE ng/mL
Carisoprodol, Urine: NEGATIVE ng/mL
Fentanyl, Ur: NEGATIVE ng/mL
Tramadol Scrn, Ur: NEGATIVE ng/mL

## 2012-12-06 NOTE — Addendum Note (Signed)
Addended by: Ignacia Palma on: 12/06/2012 07:13 AM   Modules accepted: Orders

## 2012-12-07 LAB — OPIATES/OPIOIDS (LC/MS-MS)
Hydrocodone: NEGATIVE ng/mL
Morphine Urine: NEGATIVE ng/mL
Norhydrocodone, Ur: NEGATIVE ng/mL
Oxymorphone: 1959 ng/mL

## 2012-12-08 ENCOUNTER — Ambulatory Visit: Payer: Medicare PPO | Admitting: Physical Therapy

## 2012-12-11 ENCOUNTER — Ambulatory Visit: Payer: Medicare PPO

## 2012-12-12 ENCOUNTER — Ambulatory Visit: Payer: Medicare PPO | Admitting: Internal Medicine

## 2012-12-12 ENCOUNTER — Encounter: Payer: Medicare PPO | Admitting: Internal Medicine

## 2012-12-19 ENCOUNTER — Emergency Department (HOSPITAL_COMMUNITY)
Admission: EM | Admit: 2012-12-19 | Discharge: 2012-12-19 | Disposition: A | Discharge status: Home / Self Care | Payer: Medicare PPO | Attending: Emergency Medicine | Admitting: Emergency Medicine

## 2012-12-19 ENCOUNTER — Encounter (HOSPITAL_COMMUNITY): Payer: Self-pay | Admitting: Emergency Medicine

## 2012-12-19 ENCOUNTER — Emergency Department (HOSPITAL_COMMUNITY): Payer: Medicare PPO

## 2012-12-19 DIAGNOSIS — IMO0001 Reserved for inherently not codable concepts without codable children: Secondary | ICD-10-CM | POA: Insufficient documentation

## 2012-12-19 DIAGNOSIS — Z8673 Personal history of transient ischemic attack (TIA), and cerebral infarction without residual deficits: Secondary | ICD-10-CM | POA: Insufficient documentation

## 2012-12-19 DIAGNOSIS — Z21 Asymptomatic human immunodeficiency virus [HIV] infection status: Secondary | ICD-10-CM | POA: Insufficient documentation

## 2012-12-19 DIAGNOSIS — F329 Major depressive disorder, single episode, unspecified: Secondary | ICD-10-CM | POA: Insufficient documentation

## 2012-12-19 DIAGNOSIS — J449 Chronic obstructive pulmonary disease, unspecified: Secondary | ICD-10-CM | POA: Insufficient documentation

## 2012-12-19 DIAGNOSIS — E7800 Pure hypercholesterolemia, unspecified: Secondary | ICD-10-CM | POA: Insufficient documentation

## 2012-12-19 DIAGNOSIS — Z8639 Personal history of other endocrine, nutritional and metabolic disease: Secondary | ICD-10-CM | POA: Insufficient documentation

## 2012-12-19 DIAGNOSIS — Z7982 Long term (current) use of aspirin: Secondary | ICD-10-CM | POA: Insufficient documentation

## 2012-12-19 DIAGNOSIS — M25476 Effusion, unspecified foot: Secondary | ICD-10-CM | POA: Insufficient documentation

## 2012-12-19 DIAGNOSIS — Z8719 Personal history of other diseases of the digestive system: Secondary | ICD-10-CM | POA: Insufficient documentation

## 2012-12-19 DIAGNOSIS — R110 Nausea: Secondary | ICD-10-CM | POA: Insufficient documentation

## 2012-12-19 DIAGNOSIS — J4489 Other specified chronic obstructive pulmonary disease: Secondary | ICD-10-CM | POA: Insufficient documentation

## 2012-12-19 DIAGNOSIS — Z79899 Other long term (current) drug therapy: Secondary | ICD-10-CM | POA: Insufficient documentation

## 2012-12-19 DIAGNOSIS — F3289 Other specified depressive episodes: Secondary | ICD-10-CM | POA: Insufficient documentation

## 2012-12-19 DIAGNOSIS — I252 Old myocardial infarction: Secondary | ICD-10-CM | POA: Insufficient documentation

## 2012-12-19 DIAGNOSIS — Z8739 Personal history of other diseases of the musculoskeletal system and connective tissue: Secondary | ICD-10-CM | POA: Insufficient documentation

## 2012-12-19 DIAGNOSIS — Z8742 Personal history of other diseases of the female genital tract: Secondary | ICD-10-CM | POA: Insufficient documentation

## 2012-12-19 DIAGNOSIS — Z862 Personal history of diseases of the blood and blood-forming organs and certain disorders involving the immune mechanism: Secondary | ICD-10-CM | POA: Insufficient documentation

## 2012-12-19 DIAGNOSIS — G8929 Other chronic pain: Secondary | ICD-10-CM

## 2012-12-19 DIAGNOSIS — I2510 Atherosclerotic heart disease of native coronary artery without angina pectoris: Secondary | ICD-10-CM | POA: Insufficient documentation

## 2012-12-19 DIAGNOSIS — M25471 Effusion, right ankle: Secondary | ICD-10-CM

## 2012-12-19 DIAGNOSIS — Z8619 Personal history of other infectious and parasitic diseases: Secondary | ICD-10-CM | POA: Insufficient documentation

## 2012-12-19 DIAGNOSIS — I509 Heart failure, unspecified: Secondary | ICD-10-CM | POA: Insufficient documentation

## 2012-12-19 DIAGNOSIS — M25473 Effusion, unspecified ankle: Secondary | ICD-10-CM | POA: Insufficient documentation

## 2012-12-19 DIAGNOSIS — F411 Generalized anxiety disorder: Secondary | ICD-10-CM | POA: Insufficient documentation

## 2012-12-19 DIAGNOSIS — Z87891 Personal history of nicotine dependence: Secondary | ICD-10-CM | POA: Insufficient documentation

## 2012-12-19 DIAGNOSIS — M25579 Pain in unspecified ankle and joints of unspecified foot: Secondary | ICD-10-CM | POA: Insufficient documentation

## 2012-12-19 DIAGNOSIS — Z8701 Personal history of pneumonia (recurrent): Secondary | ICD-10-CM | POA: Insufficient documentation

## 2012-12-19 DIAGNOSIS — I10 Essential (primary) hypertension: Secondary | ICD-10-CM | POA: Insufficient documentation

## 2012-12-19 DIAGNOSIS — Z9861 Coronary angioplasty status: Secondary | ICD-10-CM | POA: Insufficient documentation

## 2012-12-19 MED ORDER — IBUPROFEN 800 MG PO TABS
800.0000 mg | ORAL_TABLET | Freq: Once | ORAL | Status: AC
  Administered 2012-12-19: 800 mg via ORAL
  Filled 2012-12-19: qty 1

## 2012-12-19 MED ORDER — ONDANSETRON 4 MG PO TBDP
4.0000 mg | ORAL_TABLET | Freq: Once | ORAL | Status: AC
  Administered 2012-12-19: 4 mg via ORAL
  Filled 2012-12-19: qty 1

## 2012-12-19 MED ORDER — OXYCODONE-ACETAMINOPHEN 5-325 MG PO TABS
2.0000 | ORAL_TABLET | Freq: Once | ORAL | Status: AC
  Administered 2012-12-19: 2 via ORAL
  Filled 2012-12-19: qty 2

## 2012-12-19 NOTE — ED Notes (Signed)
Rt foot pain since last night, woke up and it hurt to walk on it  Had to use w/c she states no injury to it. Some swelling cannot bear wt
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
## 2012-12-19 NOTE — ED Provider Notes (Signed)
Medical screening examination/treatment/procedure(s) were conducted as a shared visit with non-physician practitioner(s) and myself.  I personally evaluated the patient during the encounter  Doug Sou, MD 12/19/12 2202

## 2012-12-19 NOTE — ED Notes (Signed)
Patient states this swelling started yesterday and she had the same problem in the past and she was hospitalized because of it. She has +pedal pulse, foot is warm and dry. She is able to wiggle toes but has decreased movement in the ankle due to the pain and swelling.

## 2012-12-19 NOTE — ED Provider Notes (Signed)
Recent with a painful right ankle onset November 2013. Became much worse since this morning when she attempted to walk. Patient has walker and wheelchair at home . On exam right lower extremity minimally swollen at lateral leg this. No redness no warmth. DP pulse 2+. No signs of infection clinically X-ray viewed  Spoke with Dr. Yevette Edwards on-call for Dr. Ave Filter plan patient to be seen in office tomorrow if ankle requires arthrocentesis will be performed as an outpatient in the office  Doug Sou, MD 12/19/12 1813

## 2012-12-26 ENCOUNTER — Telehealth: Payer: Self-pay | Admitting: Cardiovascular Disease

## 2012-12-26 NOTE — Telephone Encounter (Signed)
New problem    Status of fax that was sent over on  4/8.    Please advise.

## 2012-12-28 ENCOUNTER — Ambulatory Visit (INDEPENDENT_AMBULATORY_CARE_PROVIDER_SITE_OTHER): Payer: Medicare HMO | Admitting: Internal Medicine

## 2012-12-28 ENCOUNTER — Ambulatory Visit (HOSPITAL_BASED_OUTPATIENT_CLINIC_OR_DEPARTMENT_OTHER): Payer: Medicare PPO | Attending: Internal Medicine

## 2012-12-28 ENCOUNTER — Encounter: Payer: Self-pay | Admitting: Internal Medicine

## 2012-12-28 VITALS — Ht 62.0 in | Wt 278.0 lb

## 2012-12-28 VITALS — BP 131/80 | HR 89 | Temp 96.9°F | Ht 62.0 in | Wt 278.0 lb

## 2012-12-28 DIAGNOSIS — M659 Synovitis and tenosynovitis, unspecified: Secondary | ICD-10-CM

## 2012-12-28 DIAGNOSIS — G4733 Obstructive sleep apnea (adult) (pediatric): Secondary | ICD-10-CM | POA: Insufficient documentation

## 2012-12-28 NOTE — Patient Instructions (Signed)
**  Continue to use the boot or the brace from Dr. Ave Filter. Please be sure to keep your appointment with him next week.

## 2012-12-28 NOTE — Progress Notes (Signed)
Patient ID: ANAHLIA ISEMINGER, female   DOB: [birth year], 52 y.o.   MRN: [id number]  Subjective:   Patient ID: NICHOLETTE DOLSON female   DOB: 11/29/1960 52 y.o.   MRN: [id number]  HPI: Ms.Ivi R Christy is a 52 y.o. female with PMH CAD, CHF, hyperlipidemia HIV, prior CVA, and hypothyroidism presents to [facility] for an ED f/u.    She seen in the emergency room on 4/15 complaining of right ankle swelling and pain. She was previously hospitalized in Nov '13 with significant right foot/ankle swelling and erythema concerning for osteomyelitis. However, this was ruled out, and per imaging it was thought she had atypical synovitis. She was treated with an intra-articular steroid injection which significantly improved her symptoms.   In her most recent ED visit, there were no signs of infection, and she was referred back to her Orthopedist, Dr. Ave Filter, who she is to see next Wednesday. She has been wearing a brace from Dr. Ave Filter, which does seem to be improving her symptoms. She is to follow up with him in the next few days.    Past Medical History  Diagnosis Date  . Cholecystitis     Gall bladder removed   . Hypercholesterolemia   . Fibroids   . HIV (human immunodeficiency virus infection) 05/27/11    CD4 460, VL undetectable 10/12/12  . Pelvic pain in female 10/14/11  . PMB (postmenopausal bleeding)     Since 2010  . Increased BMI 05/27/11  . Anxiety   . Depression   . Hypertension   . H/O varicella   . History of measles, mumps, or rubella   . Blood transfusion   . Hypothyroidism     Reports h/o hypothyroidism; not on any meds, TSH wnl over several years  . 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 Myocardial infarction 07/2004  . COPD (chronic obstructive pulmonary disease)     well controlled, only using albuterol inhaler occasionally   . Pneumonia 1980's    "once"  . Iron deficiency anemia   . Lower GI bleed 04/24/2012     from hemorrhoids. No recent colonoscopy   . 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 Arthritis     "knees"  . CAD in native artery     s/p stent and restenting of LAD. On plavix   Current Outpatient Prescriptions  Medication Sig Dispense Refill  . acetaminophen (TYLENOL) 500 MG tablet Take 500 mg by mouth every 6 (six) hours as needed for pain or fever.      Marland Kitchen albuterol (PROVENTIL HFA;VENTOLIN HFA) 108 (90 BASE) MCG/ACT inhaler Inhale 2 puffs into the lungs every 6 (six) hours as needed. For wheezing and shortness of breath      . albuterol (PROVENTIL) (5 MG/ML) 0.5% nebulizer solution Take 2.5 mg by nebulization every 2 (two) hours as needed. For shortness of breath      . aspirin 81 MG EC tablet Take 1 tablet (81 mg total) by mouth daily. Swallow whole.  30 tablet  12  . carvedilol (COREG) 3.125 MG tablet Take 1 tablet (3.125 mg total) by mouth 2 (two) times daily with a meal. 9am and 6pm  60 tablet  5  . clobetasol cream (TEMOVATE) 0.05 % Apply 1 application topically 2 (two) times daily as needed (inflammation).       Marland Kitchen  clopidogrel (PLAVIX) 75 MG tablet Take 75 mg by mouth daily.      . diphenoxylate-atropine (LOMOTIL) 2.5-0.025 MG per tablet Take 1 tablet by mouth 4 (four) times daily as needed. For loose stool      . elvitegravir-cobicistat-emtricitabine-tenofovir (STRIBILD) 150-150-200-300 MG TABS Take 1 tablet by mouth daily with breakfast.      . ferrous sulfate 325 (65 FE) MG tablet Take 1 tablet (325 mg total) by mouth 3 (three) times daily with meals.  90 tablet  3  . gabapentin (NEURONTIN) 300 MG capsule Take 300 mg by mouth 3 (three) times daily.      . hydrochlorothiazide (HYDRODIURIL) 25 MG tablet Take 25 mg by mouth daily.       . isosorbide mononitrate (IMDUR) 60 MG 24 hr tablet Take 1 tablet (60 mg total) by mouth every evening. 5pm  30 tablet  5  . lisinopril (PRINIVIL,ZESTRIL) 5 MG  tablet Take 1 tablet (5 mg total) by mouth daily.  30 tablet  11  . lubiprostone (AMITIZA) 24 MCG capsule Take 24 mcg by mouth daily with breakfast.       . Multiple Vitamin (MULTIVITAMIN WITH MINERALS) TABS Take 1 tablet by mouth daily.      . nitroGLYCERIN (NITROSTAT) 0.3 MG SL tablet Place 0.3 mg under the tongue every 5 (five) minutes as needed. For chest pain      . oxyCODONE-acetaminophen (ROXICET) 5-325 MG per tablet Take 1-2 tablets by mouth every 6 (six) hours as needed for pain.  90 tablet  0  . pantoprazole (PROTONIX) 20 MG tablet Take 20 mg by mouth daily.       . polyethylene glycol (MIRALAX / GLYCOLAX) packet Take 17 g by mouth daily as needed (constipation).      . rosuvastatin (CRESTOR) 5 MG tablet Take 1 tablet (5 mg total) by mouth every evening. 5pm      . traZODone (DESYREL) 50 MG tablet Take 50 mg by mouth at bedtime.      . valACYclovir (VALTREX) 1000 MG tablet Take 1,000 mg by mouth Once daily as needed. For flare ups       No current facility-administered medications for this visit.   Family History  Problem Relation Age of Onset  . Hypertension Mother   . Hyperlipidemia Mother   . Obesity Mother   . Heart disease Mother   . Hypertension Maternal Aunt   . Hyperlipidemia Maternal Aunt   . Obesity Maternal Aunt   . Stroke Maternal Aunt    History   Social History  . Marital Status: Single    Spouse Name: N/A    Number of Children: N/A  . Years of Education: N/A   Social History Main Topics  . Smoking status: Former Smoker -- 0.50 packs/day for 29 years    Types: Cigarettes    Quit date: 05/07/2006  . Smokeless tobacco: Never Used  . Alcohol Use: 0.0 oz/week    2-3 Glasses of wine per week     Comment: 04/24/12 "wine once or twice q couple months"  . Drug Use: No     Comment: 04/24/2012 pt. states has not done any drugs  since 11/2011  . Sexually Active: Yes -- Female partner(s)    Birth Control/ Protection:      Comment: gave her condoms   Other Topics  Concern  . None   Social History Narrative  . None   Review of Systems: A 10 point ROS was performed; pertinent positives and  negatives were noted in the HPI   Objective:  Physical Exam: Filed Vitals:   12/28/12 0839  BP: 131/80  Pulse: 89  Temp: 96.9 F (36.1 C)  TempSrc: Oral  Height: 5\' 2"  (1.575 m)  Weight: 278 lb (126.1 kg)  SpO2: 100%   Constitutional: Vital signs reviewed.  Patient is an obese female in no acute distress and cooperative with exam. Alert and oriented x3.  Head: Normocephalic and atraumatic Eyes: PERRL, EOMI, conjunctivae normal, No scleral icterus.   thyromegaly, or carotid bruit present.  Cardiovascular: RRR, S1 normal, S2 normal, no MRG, pulses symmetric and intact bilaterally Pulmonary/Chest: CTAB, no wheezes, rales, or rhonchi Abdominal: Soft. Non-tender, no guarding present.  Musculoskeletal: No joint deformities, erythema, edema, or tenderness Neurological: A&O x3, nonfocal Skin: Warm, dry and intact. No rash, cyanosis, or clubbing.  Psychiatric: Normal mood and affect.   Assessment & Plan:   Please refer to Problem List based Assessment and Plan

## 2012-12-30 NOTE — Progress Notes (Signed)
Case discussed with Dr. Sherrine Maples soon after the visit. We reviewed the resident's history and exam and pertinent patient test results. I agree with the assessment, diagnosis and plan of care documented in the resident's note.

## 2012-12-31 DIAGNOSIS — R0989 Other specified symptoms and signs involving the circulatory and respiratory systems: Secondary | ICD-10-CM

## 2012-12-31 DIAGNOSIS — G4733 Obstructive sleep apnea (adult) (pediatric): Secondary | ICD-10-CM

## 2012-12-31 DIAGNOSIS — R0609 Other forms of dyspnea: Secondary | ICD-10-CM

## 2013-01-01 NOTE — Procedures (Signed)
NAMESULEMA, BRAID              ACCOUNT NO.:  [id number]  MEDICAL RECORD NO.:  [id number]          PATIENT TYPE:  OUT  LOCATION:  SLEEP CENTER                 FACILITY:  [facility]  PHYSICIAN:  Clinton D. Maple Hudson, MD, FCCP, FACPDATE OF BIRTH:  April 07[birth year] 1961  DATE OF STUDY:  12/28/2012                           NOCTURNAL POLYSOMNOGRAM  REFERRING PHYSICIAN:  C. Ulyess Mort, M.D.  INDICATION FOR STUDY:  Hypersomnia with sleep apnea.  EPWORTH SLEEPINESS SCORE:  9/24, BMI 50.1, weight 274 pounds, height 62 inches, neck 15.5 inches.  MEDICATIONS:  Home medications are charted and reviewed.  SLEEP ARCHITECTURE:  Total sleep time 238 minutes with sleep efficiency 64.4%.  Stage I was 9.9%, stage II 74.2%.  Stage III absent, REM 16% of total sleep time.  Sleep latency 13.5 minutes, REM latency 126 minutes. Awake after sleep onset 118 minutes.  Arousal index 9.1.  Bedtime medication:  None.  RESPIRATORY DATA:  Apnea-hypopnea index (AHI) 12.1 per hour.  A total of 48 events were scored include, all hypopneas associated with supine sleep position and with REM.  REM AHI 56.8 per hour.  There were not enough early events to meet protocol requirements for initiation of split protocol CPAP titration on this study night.  OXYGEN DATA:  Moderate snoring with oxygen desaturation to a nadir of 85% and mean oxygen saturation through the study of 96.3% on room air.  CARDIAC DATA:  Sinus rhythm with PVCs.  MOVEMENT-PARASOMNIA:  No significant movement disturbance.  Bathroom x1.  IMPRESSION-RECOMMENDATION: 1. Mild obstructive sleep apnea/hypopnea syndrome, AHI 12.1 per hour     wore, mostly hypopneas associated with supine sleep position and     with REM.  Moderate snoring with oxygen desaturation to a nadir of     85% and mean oxygen saturation through the study of 96.3% on room     air. 2. She did not have enough early events to qualify for split protocol     CPAP titration on this study.  For  scores in     this range usually conservative measures such as weight loss,     encouragement to sleep off flat of back, and treatment for any     significant nasal congestion will be tried first.     Joni Fears D. Maple Hudson, MD, [facility], FACP Diplomate, American Board of Sleep Medicine    CDY/MEDQ  D:  12/31/2012 14:49:50  T:  01/01/2013 08:13:05  Job:  [id number]

## 2013-01-01 NOTE — Assessment & Plan Note (Addendum)
Pt dx with atypical synovitis during hospitalization in Nov '13 and treated with intra-articular steroids, which improved her symptoms. She was seen in the ED on 4/16 with synovitis flare that has significantly improved with the ankle brace from her orthopedist, whom she is to follow up with in the next few days. I encouraged her to keep the appointment, and since she was doing better when seen in [facility], no further intervention is needed from me at this time.

## 2013-01-02 ENCOUNTER — Ambulatory Visit (INDEPENDENT_AMBULATORY_CARE_PROVIDER_SITE_OTHER): Payer: Medicare PPO | Admitting: Dietician

## 2013-01-02 ENCOUNTER — Encounter: Payer: Self-pay | Admitting: Dietician

## 2013-01-02 ENCOUNTER — Ambulatory Visit
Admission: RE | Admit: 2013-01-02 | Discharge: 2013-01-02 | Disposition: A | Discharge status: Home / Self Care | Payer: Medicare PPO | Source: Ambulatory Visit | Attending: Internal Medicine | Admitting: Internal Medicine

## 2013-01-02 DIAGNOSIS — E8881 Metabolic syndrome: Secondary | ICD-10-CM

## 2013-01-02 DIAGNOSIS — Z1231 Encounter for screening mammogram for malignant neoplasm of breast: Secondary | ICD-10-CM

## 2013-01-02 NOTE — Patient Instructions (Addendum)
All Calories count-   please write down what you eat and drink for at least 7 days in the next few weeks.   Follow up in 3-4 weeks

## 2013-01-02 NOTE — Progress Notes (Signed)
Patient seen today for weight loss/metabolic syndrome. Accompanied by neighbor. Resides with daughter and boyfriend. She does food shopping once monthly. She verbalizes motivation to make lifestyle changes. Reports intake of beer and mixed drinks on weekends, Large portions and fried foods contribute to excess calorie intake.   1. Ht     62" stated      Wt     277#     BMI: 50.7- class III obesity  Wt Change since last visit: loss of 2# 2.Structured  Diet: not prior, instructed on calorie counting today 3. Medication for weight loss: none at present- patient is interested, encouraged her to discuss with physician 4. Exercise plan: type: stationary bike or swimming     Frequency: 0      Duration:   0    Intensity:  0   Titration:   0    5. Behavior modification interventions: education about calorie counting,healhtier food choices and preparation, self discovery/assessment, goal setting-7% bodyweight- 257# 6. Assessment of Patient progress : 24 hour diet recall estimated intake ~ 3000 calories per day, estimated needs for weight loss of 7% recommended 1800-2000 calories/day, has control of foods purchased and prepared

## 2013-01-04 ENCOUNTER — Other Ambulatory Visit: Payer: Self-pay | Admitting: *Deleted

## 2013-01-04 DIAGNOSIS — Z79899 Other long term (current) drug therapy: Secondary | ICD-10-CM

## 2013-01-04 NOTE — Telephone Encounter (Signed)
LMTCB ./CY 

## 2013-01-05 MED ORDER — OXYCODONE-ACETAMINOPHEN 5-325 MG PO TABS: 1.0000 | ORAL_TABLET | Freq: Four times a day (QID) | ORAL | Status: DC | PRN

## 2013-01-05 NOTE — Telephone Encounter (Signed)
LMTCB ./CY 

## 2013-01-05 NOTE — Telephone Encounter (Signed)
PER SHANNON PT  IS READY TO PROCEED WITH PROCEDURE  CLEARANCE NOTE  WAS RECEIVED .[name]

## 2013-01-16 ENCOUNTER — Telehealth: Payer: Self-pay | Admitting: *Deleted

## 2013-01-16 DIAGNOSIS — R269 Unspecified abnormalities of gait and mobility: Secondary | ICD-10-CM

## 2013-01-16 DIAGNOSIS — M545 Low back pain: Secondary | ICD-10-CM

## 2013-01-16 NOTE — Telephone Encounter (Signed)
Call from pt requesting a Rolator type walker and a shower chair.  Angelina Ok, RN 5/13/204 5:11 PM

## 2013-01-17 NOTE — Telephone Encounter (Signed)
ORders in Thanks [employer]

## 2013-01-30 ENCOUNTER — Ambulatory Visit (HOSPITAL_BASED_OUTPATIENT_CLINIC_OR_DEPARTMENT_OTHER): Payer: Medicare PPO | Admitting: Dietician

## 2013-01-30 DIAGNOSIS — E8881 Metabolic syndrome: Secondary | ICD-10-CM

## 2013-01-30 NOTE — Patient Instructions (Addendum)
You can eat all the vegies you want when you are hungry.   Keep track of your foods and drinks over the next 4 weeks.  Keep track of your activity over the next 4 weeks- start with biking walking, exericise in chair for 5- 10 minutes  Then increase by 5 minutes each week until goal is 30 minutes 5 times a week.   Please make a follow up appointment for 3-4 weeks

## 2013-01-30 NOTE — Progress Notes (Signed)
Patient seen today for weight loss/metabolic syndrome.  She reports seeing physician on [employer] for weight loss and starting Toprimax.  She continues to verbalize motivation to make lifestyle changes. Reports  No intake of soda, alcohol or sweets and is not ready to retry at this time.    1. Ht     62" stated      Wt     266#     BMI: 48.7- class III obesity  Wt Change since last visit: loss of 10# 2.Structured  Diet: not calorie counting, eating healthier and smaller portions limiting starches and fat.  3. Medication for weight loss:  4. Exercise plan: type: stationary bike or swimming     Frequency: 2      Duration: 30 minutes- recormmended this 3-5 days a week    Intensity:  0   Titration:   Educated on in creasing by 5-10 minutes per week    5. Behavior modification interventions: healthier food choices and preparation, self discovery/assessment, goal setting-7% bodyweight- 257# 6. Assessment of Patient progress : 24 hour diet recall estimated intake ~ 1500 calories per day, estimated needs for weight loss of 7% recommended 1800-2000 calories/day, weight loss is rapid. Encouraged patient to keep track of weekly weight and activity totals.

## 2013-02-02 ENCOUNTER — Other Ambulatory Visit: Payer: Medicare PPO

## 2013-02-02 DIAGNOSIS — B20 Human immunodeficiency virus [HIV] disease: Secondary | ICD-10-CM

## 2013-02-02 LAB — COMPLETE METABOLIC PANEL WITH GFR
Albumin: 4.2 g/dL (ref 3.5–5.2)
BUN: 14 mg/dL (ref 6–23)
CO2: 24 mEq/L (ref 19–32)
Calcium: 9.4 mg/dL (ref 8.4–10.5)
Chloride: 108 mEq/L (ref 96–112)
GFR, Est African American: >89 mL/min
GFR, Est Non African American: >89 mL/min
Glucose, Bld: 89 mg/dL (ref 70–99)
Potassium: 3.9 mEq/L (ref 3.5–5.3)

## 2013-02-02 LAB — CBC WITH DIFFERENTIAL/PLATELET
Basophils Absolute: 0 10*3/uL (ref 0.0–0.1)
Basophils Relative: 0 % (ref 0–1)
Lymphocytes Relative: 26 % (ref 12–46)
Neutro Abs: 4 10*3/uL (ref 1.7–7.7)
Platelets: 245 10*3/uL (ref 150–400)
RDW: 17.1 % — ABNORMAL HIGH (ref 11.5–15.5)
WBC: 6 10*3/uL (ref 4.0–10.5)

## 2013-02-02 LAB — T-HELPER CELL (CD4) - (RCID CLINIC ONLY): CD4 % Helper T Cell: 34 % (ref 33–55)

## 2013-02-04 LAB — HIV-1 RNA QUANT-NO REFLEX-BLD: HIV-1 RNA Quant, Log: <1.3 {Log} (ref <1.30)

## 2013-02-05 ENCOUNTER — Other Ambulatory Visit: Payer: Medicare PPO

## 2013-02-06 ENCOUNTER — Emergency Department (HOSPITAL_COMMUNITY)
Admission: EM | Admit: 2013-02-06 | Discharge: 2013-02-06 | Disposition: A | Discharge status: Home / Self Care | Payer: Medicaid Other | Attending: Emergency Medicine | Admitting: Emergency Medicine

## 2013-02-06 ENCOUNTER — Encounter (HOSPITAL_COMMUNITY): Payer: Self-pay | Admitting: Nurse Practitioner

## 2013-02-06 ENCOUNTER — Other Ambulatory Visit: Payer: Self-pay | Admitting: *Deleted

## 2013-02-06 DIAGNOSIS — D509 Iron deficiency anemia, unspecified: Secondary | ICD-10-CM | POA: Insufficient documentation

## 2013-02-06 DIAGNOSIS — Z79899 Other long term (current) drug therapy: Secondary | ICD-10-CM | POA: Insufficient documentation

## 2013-02-06 DIAGNOSIS — Z87898 Personal history of other specified conditions: Secondary | ICD-10-CM | POA: Insufficient documentation

## 2013-02-06 DIAGNOSIS — S81009A Unspecified open wound, unspecified knee, initial encounter: Secondary | ICD-10-CM | POA: Insufficient documentation

## 2013-02-06 DIAGNOSIS — E7800 Pure hypercholesterolemia, unspecified: Secondary | ICD-10-CM | POA: Insufficient documentation

## 2013-02-06 DIAGNOSIS — Z7902 Long term (current) use of antithrombotics/antiplatelets: Secondary | ICD-10-CM | POA: Insufficient documentation

## 2013-02-06 DIAGNOSIS — Z8673 Personal history of transient ischemic attack (TIA), and cerebral infarction without residual deficits: Secondary | ICD-10-CM | POA: Insufficient documentation

## 2013-02-06 DIAGNOSIS — J449 Chronic obstructive pulmonary disease, unspecified: Secondary | ICD-10-CM | POA: Insufficient documentation

## 2013-02-06 DIAGNOSIS — IMO0002 Reserved for concepts with insufficient information to code with codable children: Secondary | ICD-10-CM

## 2013-02-06 DIAGNOSIS — F3289 Other specified depressive episodes: Secondary | ICD-10-CM | POA: Insufficient documentation

## 2013-02-06 DIAGNOSIS — Z8719 Personal history of other diseases of the digestive system: Secondary | ICD-10-CM | POA: Insufficient documentation

## 2013-02-06 DIAGNOSIS — Z862 Personal history of diseases of the blood and blood-forming organs and certain disorders involving the immune mechanism: Secondary | ICD-10-CM | POA: Insufficient documentation

## 2013-02-06 DIAGNOSIS — M129 Arthropathy, unspecified: Secondary | ICD-10-CM | POA: Insufficient documentation

## 2013-02-06 DIAGNOSIS — I10 Essential (primary) hypertension: Secondary | ICD-10-CM | POA: Insufficient documentation

## 2013-02-06 DIAGNOSIS — Z8701 Personal history of pneumonia (recurrent): Secondary | ICD-10-CM | POA: Insufficient documentation

## 2013-02-06 DIAGNOSIS — J4489 Other specified chronic obstructive pulmonary disease: Secondary | ICD-10-CM | POA: Insufficient documentation

## 2013-02-06 DIAGNOSIS — Z8619 Personal history of other infectious and parasitic diseases: Secondary | ICD-10-CM | POA: Insufficient documentation

## 2013-02-06 DIAGNOSIS — Y9389 Activity, other specified: Secondary | ICD-10-CM | POA: Insufficient documentation

## 2013-02-06 DIAGNOSIS — Z7982 Long term (current) use of aspirin: Secondary | ICD-10-CM | POA: Insufficient documentation

## 2013-02-06 DIAGNOSIS — Z87891 Personal history of nicotine dependence: Secondary | ICD-10-CM | POA: Insufficient documentation

## 2013-02-06 DIAGNOSIS — I509 Heart failure, unspecified: Secondary | ICD-10-CM | POA: Insufficient documentation

## 2013-02-06 DIAGNOSIS — F411 Generalized anxiety disorder: Secondary | ICD-10-CM | POA: Insufficient documentation

## 2013-02-06 DIAGNOSIS — Z8742 Personal history of other diseases of the female genital tract: Secondary | ICD-10-CM | POA: Insufficient documentation

## 2013-02-06 DIAGNOSIS — Z21 Asymptomatic human immunodeficiency virus [HIV] infection status: Secondary | ICD-10-CM | POA: Insufficient documentation

## 2013-02-06 DIAGNOSIS — Z23 Encounter for immunization: Secondary | ICD-10-CM | POA: Insufficient documentation

## 2013-02-06 DIAGNOSIS — D259 Leiomyoma of uterus, unspecified: Secondary | ICD-10-CM | POA: Insufficient documentation

## 2013-02-06 DIAGNOSIS — F329 Major depressive disorder, single episode, unspecified: Secondary | ICD-10-CM | POA: Insufficient documentation

## 2013-02-06 DIAGNOSIS — R638 Other symptoms and signs concerning food and fluid intake: Secondary | ICD-10-CM | POA: Insufficient documentation

## 2013-02-06 DIAGNOSIS — Z9861 Coronary angioplasty status: Secondary | ICD-10-CM | POA: Insufficient documentation

## 2013-02-06 DIAGNOSIS — Y9289 Other specified places as the place of occurrence of the external cause: Secondary | ICD-10-CM | POA: Insufficient documentation

## 2013-02-06 DIAGNOSIS — I2510 Atherosclerotic heart disease of native coronary artery without angina pectoris: Secondary | ICD-10-CM | POA: Insufficient documentation

## 2013-02-06 DIAGNOSIS — Z8639 Personal history of other endocrine, nutritional and metabolic disease: Secondary | ICD-10-CM | POA: Insufficient documentation

## 2013-02-06 DIAGNOSIS — I252 Old myocardial infarction: Secondary | ICD-10-CM | POA: Insufficient documentation

## 2013-02-06 DIAGNOSIS — W268XXA Contact with other sharp object(s), not elsewhere classified, initial encounter: Secondary | ICD-10-CM | POA: Insufficient documentation

## 2013-02-06 MED ORDER — OXYCODONE-ACETAMINOPHEN 5-325 MG PO TABS: 1.0000 | ORAL_TABLET | Freq: Four times a day (QID) | ORAL | Status: DC | PRN

## 2013-02-06 MED ORDER — LIDOCAINE-EPINEPHRINE 2 %-1:100000 IJ SOLN
20.0000 mL | Freq: Once | INTRAMUSCULAR | Status: AC
  Administered 2013-02-06: 20 mL via INTRADERMAL
  Filled 2013-02-06: qty 20

## 2013-02-06 MED ORDER — TETANUS-DIPHTH-ACELL PERTUSSIS 5-2.5-18.5 LF-MCG/0.5 IM SUSP
0.5000 mL | Freq: Once | INTRAMUSCULAR | Status: AC
  Administered 2013-02-06: 0.5 mL via INTRAMUSCULAR
  Filled 2013-02-06 (×2): qty 0.5
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
## 2013-02-06 NOTE — ED Notes (Signed)
Unable to obtain O2 reading due to nail polish
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
## 2013-02-08 NOTE — ED Provider Notes (Signed)
Medical screening examination/treatment/procedure(s) were performed by non-physician practitioner and as supervising physician I was immediately available for consultation/collaboration.   Johny Pitstick B. Shiesha Jahn, MD 02/08/13 1555 

## 2013-02-14 ENCOUNTER — Encounter: Payer: Self-pay | Admitting: Internal Medicine

## 2013-02-14 ENCOUNTER — Ambulatory Visit (INDEPENDENT_AMBULATORY_CARE_PROVIDER_SITE_OTHER): Payer: Commercial Managed Care - HMO | Admitting: Internal Medicine

## 2013-02-14 VITALS — BP 140/86 | HR 90 | Temp 97.3°F | Ht 62.0 in | Wt 260.8 lb

## 2013-02-14 VITALS — BP 122/83 | HR 84 | Temp 98.3°F | Ht 62.0 in | Wt 260.0 lb

## 2013-02-14 DIAGNOSIS — E8881 Metabolic syndrome: Secondary | ICD-10-CM

## 2013-02-14 DIAGNOSIS — Z79899 Other long term (current) drug therapy: Secondary | ICD-10-CM

## 2013-02-14 DIAGNOSIS — B20 Human immunodeficiency virus [HIV] disease: Secondary | ICD-10-CM

## 2013-02-14 DIAGNOSIS — Z1211 Encounter for screening for malignant neoplasm of colon: Secondary | ICD-10-CM

## 2013-02-14 DIAGNOSIS — I10 Essential (primary) hypertension: Secondary | ICD-10-CM

## 2013-02-14 DIAGNOSIS — M1712 Unilateral primary osteoarthritis, left knee: Secondary | ICD-10-CM

## 2013-02-14 DIAGNOSIS — I5022 Chronic systolic (congestive) heart failure: Secondary | ICD-10-CM

## 2013-02-14 DIAGNOSIS — M1710 Unilateral primary osteoarthritis, unspecified knee: Secondary | ICD-10-CM

## 2013-02-14 DIAGNOSIS — Z113 Encounter for screening for infections with a predominantly sexual mode of transmission: Secondary | ICD-10-CM

## 2013-02-14 DIAGNOSIS — I509 Heart failure, unspecified: Secondary | ICD-10-CM

## 2013-02-14 DIAGNOSIS — K5730 Diverticulosis of large intestine without perforation or abscess without bleeding: Secondary | ICD-10-CM

## 2013-02-14 DIAGNOSIS — G4733 Obstructive sleep apnea (adult) (pediatric): Secondary | ICD-10-CM

## 2013-02-14 MED ORDER — OXYCODONE-ACETAMINOPHEN 10-325 MG PO TABS: 1.0000 | ORAL_TABLET | Freq: Four times a day (QID) | ORAL | Status: DC | PRN

## 2013-02-14 NOTE — Assessment & Plan Note (Signed)
Completed at [facility], some sigmoid diverticulosis but no abnl. Repeat in 10 yrs

## 2013-02-14 NOTE — Progress Notes (Signed)
Case discussed with Dr. Ziemer at the time of the visit.  We reviewed the resident's history and exam and pertinent patient test results.  I agree with the assessment, diagnosis and plan of care documented in the resident's note. 

## 2013-02-14 NOTE — Assessment & Plan Note (Signed)
Euvolemic today. No CP, DOE, orthopnea.  Continue current thearpy: BB, ASA, ACE-i

## 2013-02-14 NOTE — Assessment & Plan Note (Signed)
BP Readings from Last 3 Encounters:  02/14/13 140/86  02/14/13 122/83  02/06/13 137/93    Lab Results  Component Value Date   NA 140 02/02/2013   K 3.9 02/02/2013   CREATININE 0.74 02/02/2013    Assessment: Blood pressure control: controlled Progress toward BP goal:  at goal   Plan: Medications:  continue current medications Educational resources provided: brochure;video Self management tools provided:

## 2013-02-14 NOTE — Assessment & Plan Note (Addendum)
Mild OSA by polysomnography, did not meet criteria for split study protocol w CPAP titration. - monitor sx, continue weight loss

## 2013-02-14 NOTE — Patient Instructions (Addendum)
1. Keep up the good work with your weight loss. Keep taking your medicines and follow up with the specialists. 2. We will call you about physical therapy. 3. Come back in 3 months or sooner if needed.

## 2013-02-14 NOTE — Assessment & Plan Note (Signed)
She is doing well and will continue with her same regimen. She can return to clinic in 4 months
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
## 2013-02-14 NOTE — Assessment & Plan Note (Signed)
Currently no sx, seen on screening colonoscopy. Discussed high fiber diet.

## 2013-02-14 NOTE — Progress Notes (Signed)
  Subjective:    Patient ID: [name], female    DOB: [birth year], 52 y.o.   MRN: [id number]  HPI She comes in for followup of her HIV.she is on Stribild and reports excellent compliance with no missed doses. Her CD4 count is over 500 enteroviral load remains undetectable. She feels much better including her pain and energy. She has lost 16 pounds so far at the recommendation of her orthopedist who will consider knee replacements and was recommended she lose 60 pounds. She is motivated to lose this swishing in her knees replaced. She is on a chronic narcotic medication management her primary physician.   Review of Systems  Constitutional: Negative for fatigue and unexpected weight change.  HENT: Negative for sore throat and trouble swallowing.   Gastrointestinal: Negative for diarrhea.  Musculoskeletal: Positive for joint swelling and arthralgias. Negative for myalgias.  Skin: Negative for rash.  Neurological: Negative for dizziness and headaches.  Hematological: Negative for adenopathy.  Psychiatric/Behavioral: Negative for dysphoric mood.       Objective:   Physical Exam        Assessment & Plan:

## 2013-02-14 NOTE — Assessment & Plan Note (Signed)
Contracted for percocet 10/325 #90 permonth for severe osetoarthritis of L knee. To be filled at [facility] on [address]

## 2013-02-14 NOTE — Assessment & Plan Note (Signed)
Patient has had appointment with orthopedist, reports that they will do a left total knee replacement when she can lose 60 pounds. She 17 pounds closer that goal today. -In the meantime, pain control with Percocet -PT referral made today to help with mobility in the setting of severe osteoarthritic pain.

## 2013-02-14 NOTE — Assessment & Plan Note (Addendum)
Glucose intolerance, morbid obesity. Has lost 17 lbs in last month through diet changes w Norm Parcel. Will meet monthly w Norm Parcel. Congratulated her on progress. She will be eligible for L TKA if she loses 43 more lbs.   Body mass index is 47.69 kg/(m^2). Lab Results  Component Value Date   HGBA1C 6.2* 06/13/2012

## 2013-02-15 ENCOUNTER — Ambulatory Visit: Payer: Medicare PPO | Admitting: Internal Medicine

## 2013-02-27 ENCOUNTER — Ambulatory Visit: Payer: Medicare PPO | Attending: Internal Medicine | Admitting: Rehabilitation

## 2013-02-27 DIAGNOSIS — M25669 Stiffness of unspecified knee, not elsewhere classified: Secondary | ICD-10-CM | POA: Insufficient documentation

## 2013-02-27 DIAGNOSIS — M2550 Pain in unspecified joint: Secondary | ICD-10-CM | POA: Insufficient documentation

## 2013-02-27 DIAGNOSIS — R262 Difficulty in walking, not elsewhere classified: Secondary | ICD-10-CM | POA: Insufficient documentation

## 2013-02-27 DIAGNOSIS — IMO0001 Reserved for inherently not codable concepts without codable children: Secondary | ICD-10-CM | POA: Insufficient documentation

## 2013-03-02 ENCOUNTER — Ambulatory Visit: Payer: Medicare PPO | Admitting: Dietician

## 2013-03-05 ENCOUNTER — Ambulatory Visit: Payer: Medicare PPO | Admitting: Internal Medicine

## 2013-03-13 ENCOUNTER — Ambulatory Visit: Payer: Medicare PPO | Attending: Internal Medicine | Admitting: Rehabilitation

## 2013-03-13 DIAGNOSIS — R262 Difficulty in walking, not elsewhere classified: Secondary | ICD-10-CM | POA: Insufficient documentation

## 2013-03-13 DIAGNOSIS — M2550 Pain in unspecified joint: Secondary | ICD-10-CM | POA: Insufficient documentation

## 2013-03-13 DIAGNOSIS — M25669 Stiffness of unspecified knee, not elsewhere classified: Secondary | ICD-10-CM | POA: Insufficient documentation

## 2013-03-13 DIAGNOSIS — IMO0001 Reserved for inherently not codable concepts without codable children: Secondary | ICD-10-CM | POA: Insufficient documentation

## 2013-03-15 ENCOUNTER — Ambulatory Visit: Payer: Medicare PPO | Admitting: Rehabilitation

## 2013-03-15 ENCOUNTER — Other Ambulatory Visit: Payer: Self-pay | Admitting: *Deleted

## 2013-03-15 MED ORDER — OXYCODONE-ACETAMINOPHEN 10-325 MG PO TABS: 1.0000 | ORAL_TABLET | Freq: Four times a day (QID) | ORAL | Status: DC | PRN

## 2013-03-15 NOTE — Telephone Encounter (Signed)
Last done 02/14/2013

## 2013-03-20 ENCOUNTER — Ambulatory Visit: Payer: Medicare PPO | Admitting: Rehabilitation

## 2013-03-22 ENCOUNTER — Ambulatory Visit: Payer: Medicare PPO | Admitting: Rehabilitation

## 2013-03-27 ENCOUNTER — Ambulatory Visit: Payer: Medicare PPO | Admitting: Rehabilitation

## 2013-03-29 ENCOUNTER — Ambulatory Visit: Payer: Medicare PPO | Admitting: Rehabilitation

## 2013-04-03 ENCOUNTER — Ambulatory Visit: Payer: Medicare PPO

## 2013-04-04 ENCOUNTER — Ambulatory Visit (INDEPENDENT_AMBULATORY_CARE_PROVIDER_SITE_OTHER): Payer: Medicare HMO | Admitting: Neurology

## 2013-04-04 ENCOUNTER — Encounter: Payer: Self-pay | Admitting: Neurology

## 2013-04-04 ENCOUNTER — Encounter: Payer: Self-pay | Admitting: *Deleted

## 2013-04-04 VITALS — BP 131/76 | HR 93 | Ht 62.0 in | Wt 254.0 lb

## 2013-04-04 DIAGNOSIS — I6350 Cerebral infarction due to unspecified occlusion or stenosis of unspecified cerebral artery: Secondary | ICD-10-CM

## 2013-04-04 DIAGNOSIS — I639 Cerebral infarction, unspecified: Secondary | ICD-10-CM

## 2013-04-04 NOTE — Patient Instructions (Addendum)
Continue Plavix for stroke prevention and strict control of hypertension with blood pressure goal below 130/901 lipids with LDL cholesterol goal below 100 mg percent. Check followup carotid ultrasound study. The patient was given a letter to do water aerobics not more than 2 days a week. Return for followup in 6 months with Heide Guile, NP

## 2013-04-04 NOTE — Progress Notes (Signed)
[facility] [address] [place]. [place] [address] [phone]       OFFICE FOLLOW-UP NOTE  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 04/04/2013 She is seen today for followup after last visit with me on 08/25/12. She continues to do well from the stroke standpoint without recurrent stroke or TIA symptoms. She  complains of intermittent tingling and numbness in her hands. She is still bothered by intermittent swelling in her foot and has been diagnosed with arthritis. She uses a wheeled walker mainly due to arthritic pain in his knees and right foot. She states her blood pressure is under good control and last lipid profile checked in April was normal. She was planning on having knee replacement surgery but that is on hold and she loses 60 pounds. She has already lost 20 pounds in is planning to lose more. She plans to join the school which has aerobic activities 3 days a week but she can do this only 2 days a week and needs a letter from me to support that .  ROS:   14 system review of systems is positive for easy bruising, joint pain and swelling, constipation, not enough sleep, numbness, sleepiness and restless legs.  PMH:  Past Medical History  Diagnosis Date  . Cholecystitis     Gall bladder removed   . Hypercholesterolemia   . Fibroids   . HIV (human immunodeficiency virus infection) 05/27/11    CD4 460, VL undetectable 10/12/12  . Pelvic pain in female 10/14/11  . PMB (postmenopausal bleeding)     Since 2010  . Increased BMI 05/27/11  . Anxiety   . Depression   . Hypertension   . H/O varicella   . History of measles, mumps, or  rubella   . Blood transfusion   . Hypothyroidism     Reports h/o hypothyroidism; not on any meds, TSH wnl over several years  . 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 Myocardial infarction 07/2004  . COPD (chronic obstructive pulmonary disease)     well controlled, only using albuterol inhaler occasionally   . Pneumonia 1980's    "once"  . Iron deficiency anemia   . Lower GI bleed 04/24/2012    from hemorrhoids. No recent colonoscopy   . 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 Arthritis     "knees"  . CAD in native artery     s/p stent and restenting of LAD. On plavix    Social History:  History   Social History  . Marital Status: Single    Spouse Name: N/A    Number of Children: N/A  . Years of Education: N/A   Occupational History  . Not on file.   Social History Main Topics  . Smoking status: Former Smoker -- 0.50 packs/day for 29 years    Types: Cigarettes    Quit date: 05/07/2006  . Smokeless tobacco: Never Used  . Alcohol Use: 2.0 oz/week    2-3 Glasses of wine,  4 Drinks containing 0.5 oz of alcohol, 4-6 Cans of beer per week     Comment: 04/24/12 "wine once or twice q couple months"  . Drug Use: No     Comment: 04/24/2012 pt. states has not done any drugs  since 11/2011  . Sexually Active: Yes -- Female partner(s)    Birth Control/ Protection:      Comment: gave her condoms   Other Topics Concern  . Not on file   Social History Narrative  . No narrative on file    Medications:   Current Outpatient Prescriptions on File Prior to Visit  Medication Sig Dispense Refill  . acetaminophen (TYLENOL) 500 MG tablet Take 500 mg by mouth every 6 (six) hours as needed for pain or fever.      Marland Kitchen albuterol (PROVENTIL HFA;VENTOLIN HFA) 108 (90 BASE) MCG/ACT inhaler Inhale 2 puffs into the lungs every 6 (six) hours as  needed. For wheezing and shortness of breath      . albuterol (PROVENTIL) (5 MG/ML) 0.5% nebulizer solution Take 2.5 mg by nebulization every 2 (two) hours as needed. For shortness of breath      . aspirin 81 MG EC tablet Take 1 tablet (81 mg total) by mouth daily. Swallow whole.  30 tablet  12  . carvedilol (COREG) 3.125 MG tablet Take 1 tablet (3.125 mg total) by mouth 2 (two) times daily with a meal. 9am and 6pm  60 tablet  5  . clopidogrel (PLAVIX) 75 MG tablet Take 75 mg by mouth daily.      . diphenoxylate-atropine (LOMOTIL) 2.5-0.025 MG per tablet Take 1 tablet by mouth 4 (four) times daily as needed. For loose stool      . elvitegravir-cobicistat-emtricitabine-tenofovir (STRIBILD) 150-150-200-300 MG TABS Take 1 tablet by mouth daily with breakfast.      . ferrous sulfate 325 (65 FE) MG tablet Take 1 tablet (325 mg total) by mouth 3 (three) times daily with meals.  90 tablet  3  . gabapentin (NEURONTIN) 300 MG capsule Take 300 mg by mouth 3 (three) times daily.      . hydrochlorothiazide (HYDRODIURIL) 25 MG tablet Take 25 mg by mouth daily.       . isosorbide mononitrate (IMDUR) 60 MG 24 hr tablet Take 1 tablet (60 mg total) by mouth every evening. 5pm  30 tablet  5  . lisinopril (PRINIVIL,ZESTRIL) 5 MG tablet Take 1 tablet (5 mg total) by mouth daily.  30 tablet  11  . lubiprostone (AMITIZA) 24 MCG capsule Take 24 mcg by mouth daily with breakfast.       . Multiple Vitamin (MULTIVITAMIN WITH MINERALS) TABS Take 1 tablet by mouth daily.      . nitroGLYCERIN (NITROSTAT) 0.3 MG SL tablet Place 0.3 mg under the tongue every 5 (five) minutes as needed. For chest pain      . oxyCODONE-acetaminophen (PERCOCET) 10-325 MG per tablet Take 1 tablet by mouth every 6 (six) hours as needed for pain.  90 tablet  0  . pantoprazole (PROTONIX) 20 MG tablet Take 20 mg by mouth daily.       . polyethylene glycol-electrolytes (NULYTELY/GOLYTELY) 420 G solution       . rosuvastatin (CRESTOR) 5 MG tablet Take 1  tablet (5 mg total) by mouth every evening. 5pm      . topiramate (TOPAMAX) 100 MG tablet       . traZODone (DESYREL) 50 MG tablet Take 50 mg by mouth at bedtime.      Marland Kitchen  valACYclovir (VALTREX) 1000 MG tablet Take 1,000 mg by mouth Once daily as needed. For flare ups       No current facility-administered medications on file prior to visit.    Allergies:   Allergies  Allergen Reactions  . Atorvastatin Other (See Comments)    Patient states that the medication affects her memory    Physical Exam General: obese african american lady, seated, in no evident distress Head: head normocephalic and atraumatic. Orohparynx benign Neck: supple with no carotid or supraclavicular bruits Cardiovascular: regular rate and rhythm, no murmurs Musculoskeletal: no deformity Skin:  no rash/petichiae Vascular:  Normal pulses all extremities Filed Vitals:   04/04/13 1444  BP: 131/76  Pulse: 93    Neurologic Exam Mental Status: Awake and fully alert. Oriented to place and time. Recent and remote memory intact. Attention span, concentration and fund of knowledge appropriate. Mood and affect appropriate.  Cranial Nerves: Fundoscopic exam reveals sharp disc margins. Pupils equal, briskly reactive to light. Extraocular movements full without nystagmus. Visual fields full to confrontation. Hearing intact. Facial sensation intact. Face, tongue, palate moves normally and symmetrically.  Motor: Normal bulk and tone. Normal strength in all tested extremity muscles. Mild weakness of the left grip and intrinsic hand muscles. Orbits right over left approximately. Sensory.: intact to tough and pinprick and vibratory.  Coordination: Rapid alternating movements normal in all extremities. Finger-to-nose and heel-to-shin performed accurately bilaterally. Gait and Station: Arises from chair without difficulty. Gait is broad-based and painful due to arthritis in the knees and favors the right foot and uses a wheeled walker  Reflexes: 1+ and symmetric. Toes downgoing.   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    PLAN: Continue Plavix for stroke prevention and strict control of hypertension with blood pressure goal below 130/901 lipids with LDL cholesterol goal below 100 mg percent. Check followup carotid ultrasound study. The patient was given a letter to do water aerobics not more than 2 days a week. Return for followup in 6 months with Brooke Guile, NP

## 2013-04-16 ENCOUNTER — Other Ambulatory Visit: Payer: Self-pay | Admitting: *Deleted

## 2013-04-16 ENCOUNTER — Other Ambulatory Visit: Payer: Self-pay | Admitting: Internal Medicine

## 2013-04-16 ENCOUNTER — Ambulatory Visit: Payer: Medicare PPO | Attending: Internal Medicine | Admitting: Rehabilitation

## 2013-04-16 DIAGNOSIS — IMO0001 Reserved for inherently not codable concepts without codable children: Secondary | ICD-10-CM | POA: Insufficient documentation

## 2013-04-16 DIAGNOSIS — M2550 Pain in unspecified joint: Secondary | ICD-10-CM | POA: Insufficient documentation

## 2013-04-16 DIAGNOSIS — M25669 Stiffness of unspecified knee, not elsewhere classified: Secondary | ICD-10-CM | POA: Insufficient documentation

## 2013-04-16 DIAGNOSIS — R262 Difficulty in walking, not elsewhere classified: Secondary | ICD-10-CM | POA: Insufficient documentation

## 2013-04-16 MED ORDER — OXYCODONE-ACETAMINOPHEN 10-325 MG PO TABS: 1.0000 | ORAL_TABLET | Freq: Four times a day (QID) | ORAL | Status: DC | PRN

## 2013-04-17 ENCOUNTER — Ambulatory Visit (INDEPENDENT_AMBULATORY_CARE_PROVIDER_SITE_OTHER): Payer: Medicare HMO

## 2013-04-17 DIAGNOSIS — I6350 Cerebral infarction due to unspecified occlusion or stenosis of unspecified cerebral artery: Secondary | ICD-10-CM

## 2013-04-17 DIAGNOSIS — I639 Cerebral infarction, unspecified: Secondary | ICD-10-CM

## 2013-04-18 ENCOUNTER — Ambulatory Visit: Payer: Medicare PPO | Admitting: Physical Therapy

## 2013-04-23 ENCOUNTER — Telehealth: Payer: Self-pay | Admitting: Neurology

## 2013-04-26 ENCOUNTER — Encounter: Payer: Self-pay | Admitting: Licensed Clinical Social Worker

## 2013-04-26 ENCOUNTER — Encounter: Payer: Self-pay | Admitting: *Deleted

## 2013-04-26 ENCOUNTER — Telehealth: Payer: Self-pay | Admitting: Licensed Clinical Social Worker

## 2013-04-26 NOTE — Telephone Encounter (Signed)
Patient needs a note for school that it is ok that she does  Low impact water aerobics 3 times weekly.

## 2013-04-26 NOTE — Telephone Encounter (Signed)
Ok to do

## 2013-05-01 ENCOUNTER — Other Ambulatory Visit: Payer: Self-pay | Admitting: Infectious Disease

## 2013-05-01 DIAGNOSIS — K219 Gastro-esophageal reflux disease without esophagitis: Secondary | ICD-10-CM

## 2013-05-17 ENCOUNTER — Other Ambulatory Visit: Payer: Self-pay | Admitting: *Deleted

## 2013-05-17 ENCOUNTER — Encounter: Payer: Self-pay | Admitting: Internal Medicine

## 2013-05-17 DIAGNOSIS — Z79899 Other long term (current) drug therapy: Secondary | ICD-10-CM

## 2013-05-17 DIAGNOSIS — D509 Iron deficiency anemia, unspecified: Secondary | ICD-10-CM

## 2013-05-17 DIAGNOSIS — D649 Anemia, unspecified: Secondary | ICD-10-CM

## 2013-05-17 DIAGNOSIS — J449 Chronic obstructive pulmonary disease, unspecified: Secondary | ICD-10-CM

## 2013-05-17 MED ORDER — OXYCODONE-ACETAMINOPHEN 10-325 MG PO TABS: 1.0000 | ORAL_TABLET | Freq: Four times a day (QID) | ORAL | Status: DC | PRN

## 2013-05-17 NOTE — Telephone Encounter (Signed)
Rx ready for pick-up; pt called - message left. 

## 2013-05-17 NOTE — Assessment & Plan Note (Signed)
12/05/2012 Office Visit Edited 12/05/2012 4:51 PM by Bronson Curb, MD  Lab Results    Component  Value  Date     WBC  4.5  10/12/2012     HGB  10.7*  10/12/2012     HCT  34.5*  10/12/2012     MCV  78.1  10/12/2012     PLT  290  10/12/2012    Lab Results    Component  Value  Date     IRON  26*  11/15/2012     TIBC  407  11/15/2012     FERRITIN  12  11/15/2012    Patient is anemic with iron deficiency a borderline low MCV. I prescribed iron replacement for her at her last visit.Has been FOBT positive in the past, so she has hemorrhoids. She is due for screening colonoscopy. Did not attend her last GI appointment. Maybe referral today, stressed the importance of followup. She agreed to followup.

## 2013-05-17 NOTE — Assessment & Plan Note (Signed)
Will give 1 month refill and request that patient to be seen within the next month to establish with new PCP and update pain contract.

## 2013-05-17 NOTE — Assessment & Plan Note (Deleted)
12/05/2012 Office Visit Edited 12/05/2012 4:51 PM by Carolyn Ziemer, MD  Lab Results    Component  Value  Date     WBC  4.5  10/12/2012     HGB  10.7*  10/12/2012     HCT  34.5*  10/12/2012     MCV  78.1  10/12/2012     PLT  290  10/12/2012    Lab Results    Component  Value  Date     IRON  26*  11/15/2012     TIBC  407  11/15/2012     FERRITIN  12  11/15/2012    Patient is anemic with iron deficiency a borderline low MCV. I prescribed iron replacement for her at her last visit.Has been FOBT positive in the past, so she has hemorrhoids. She is due for screening colonoscopy. Did not attend her last GI appointment. Maybe referral today, stressed the importance of followup. She agreed to followup.     

## 2013-05-21 ENCOUNTER — Telehealth: Payer: Self-pay | Admitting: Radiology

## 2013-05-31 ENCOUNTER — Encounter: Payer: Medicare PPO | Admitting: Internal Medicine

## 2013-06-11 ENCOUNTER — Other Ambulatory Visit: Payer: Self-pay | Admitting: Internal Medicine

## 2013-06-11 DIAGNOSIS — B20 Human immunodeficiency virus [HIV] disease: Secondary | ICD-10-CM

## 2013-06-14 ENCOUNTER — Other Ambulatory Visit: Payer: Self-pay | Admitting: *Deleted

## 2013-06-14 ENCOUNTER — Other Ambulatory Visit: Payer: Medicare PPO

## 2013-06-14 NOTE — Telephone Encounter (Signed)
Last filled 9/11 Pt wants to get to tomorrow

## 2013-06-15 MED ORDER — OXYCODONE-ACETAMINOPHEN 10-325 MG PO TABS: 1.0000 | ORAL_TABLET | Freq: Four times a day (QID) | ORAL | Status: DC | PRN

## 2013-06-15 NOTE — Telephone Encounter (Signed)
HAs a contract. Has an appt with PCP. Will refill 3 months. If pt no shows Dr Neita Garnet appt, then she will need to resch in order to get other 2 printed Rx.

## 2013-06-15 NOTE — Addendum Note (Signed)
Addended by: Blanch Media A on: 06/15/2013 03:26 PM   Modules accepted: Orders

## 2013-06-18 ENCOUNTER — Other Ambulatory Visit: Payer: Medicare PPO

## 2013-06-19 ENCOUNTER — Other Ambulatory Visit: Payer: Commercial Managed Care - HMO

## 2013-06-19 DIAGNOSIS — Z79899 Other long term (current) drug therapy: Secondary | ICD-10-CM

## 2013-06-19 DIAGNOSIS — B20 Human immunodeficiency virus [HIV] disease: Secondary | ICD-10-CM

## 2013-06-19 DIAGNOSIS — Z113 Encounter for screening for infections with a predominantly sexual mode of transmission: Secondary | ICD-10-CM

## 2013-06-19 LAB — COMPLETE METABOLIC PANEL WITH GFR
AST: 16 U/L (ref 0–37)
Albumin: 3.9 g/dL (ref 3.5–5.2)
Alkaline Phosphatase: 46 U/L (ref 39–117)
Potassium: 4.5 mEq/L (ref 3.5–5.3)
Sodium: 144 mEq/L (ref 135–145)
Total Protein: 6.9 g/dL (ref 6.0–8.3)

## 2013-06-19 LAB — CBC WITH DIFFERENTIAL/PLATELET
Basophils Absolute: 0 10*3/uL (ref 0.0–0.1)
Basophils Relative: 1 % (ref 0–1)
Eosinophils Relative: 3 % (ref 0–5)
HCT: 37.4 % (ref 36.0–46.0)
Lymphocytes Relative: 38 % (ref 12–46)
MCHC: 31.8 g/dL (ref 30.0–36.0)
MCV: 76.5 fL — ABNORMAL LOW (ref 78.0–100.0)
Monocytes Absolute: 0.4 10*3/uL (ref 0.1–1.0)
Monocytes Relative: 10 % (ref 3–12)
RDW: 15.3 % (ref 11.5–15.5)

## 2013-06-19 LAB — LIPID PANEL
Cholesterol: 214 mg/dL — ABNORMAL HIGH (ref 0–200)
Total CHOL/HDL Ratio: 3.7 Ratio
VLDL: 12 mg/dL (ref 0–40)

## 2013-06-19 LAB — RPR

## 2013-06-20 LAB — HIV-1 RNA QUANT-NO REFLEX-BLD
HIV 1 RNA Quant: 29 copies/mL — ABNORMAL HIGH (ref <20)
HIV-1 RNA Quant, Log: 1.46 {Log} — ABNORMAL HIGH (ref <1.30)

## 2013-06-20 LAB — T-HELPER CELL (CD4) - (RCID CLINIC ONLY)
CD4 % Helper T Cell: 31 % — ABNORMAL LOW (ref 33–55)
CD4 T Cell Abs: 450 /uL (ref 400–2700)

## 2013-06-21 ENCOUNTER — Encounter: Payer: Self-pay | Admitting: Internal Medicine

## 2013-06-21 ENCOUNTER — Ambulatory Visit (INDEPENDENT_AMBULATORY_CARE_PROVIDER_SITE_OTHER): Payer: Medicare PPO | Admitting: Internal Medicine

## 2013-06-21 ENCOUNTER — Telehealth: Payer: Self-pay | Admitting: Internal Medicine

## 2013-06-21 VITALS — BP 170/103 | HR 80 | Temp 97.3°F | Ht 62.0 in | Wt 241.8 lb

## 2013-06-21 DIAGNOSIS — M1710 Unilateral primary osteoarthritis, unspecified knee: Secondary | ICD-10-CM

## 2013-06-21 DIAGNOSIS — E785 Hyperlipidemia, unspecified: Secondary | ICD-10-CM

## 2013-06-21 DIAGNOSIS — Z79899 Other long term (current) drug therapy: Secondary | ICD-10-CM

## 2013-06-21 DIAGNOSIS — Z23 Encounter for immunization: Secondary | ICD-10-CM

## 2013-06-21 DIAGNOSIS — G5790 Unspecified mononeuropathy of unspecified lower limb: Secondary | ICD-10-CM

## 2013-06-21 DIAGNOSIS — I10 Essential (primary) hypertension: Secondary | ICD-10-CM

## 2013-06-21 DIAGNOSIS — M792 Neuralgia and neuritis, unspecified: Secondary | ICD-10-CM

## 2013-06-21 DIAGNOSIS — Z87898 Personal history of other specified conditions: Secondary | ICD-10-CM

## 2013-06-21 DIAGNOSIS — G4700 Insomnia, unspecified: Secondary | ICD-10-CM | POA: Insufficient documentation

## 2013-06-21 DIAGNOSIS — M1712 Unilateral primary osteoarthritis, left knee: Secondary | ICD-10-CM

## 2013-06-21 DIAGNOSIS — I5022 Chronic systolic (congestive) heart failure: Secondary | ICD-10-CM

## 2013-06-21 DIAGNOSIS — I509 Heart failure, unspecified: Secondary | ICD-10-CM

## 2013-06-21 MED ORDER — LUBIPROSTONE 24 MCG PO CAPS: 24.0000 ug | ORAL_CAPSULE | Freq: Every day | ORAL | Status: DC

## 2013-06-21 MED ORDER — GABAPENTIN 400 MG PO CAPS: 400.0000 mg | ORAL_CAPSULE | Freq: Three times a day (TID) | ORAL | Status: DC

## 2013-06-21 MED ORDER — PHENTERMINE HCL 37.5 MG PO CAPS: 37.5000 mg | ORAL_CAPSULE | ORAL | Status: DC

## 2013-06-21 MED ORDER — OXYCODONE-ACETAMINOPHEN 10-325 MG PO TABS: 1.0000 | ORAL_TABLET | Freq: Four times a day (QID) | ORAL | Status: DC | PRN

## 2013-06-21 MED ORDER — TOPIRAMATE 100 MG PO TABS: 100.0000 mg | ORAL_TABLET | Freq: Every day | ORAL | Status: DC

## 2013-06-21 MED ORDER — CARVEDILOL 3.125 MG PO TABS: 3.1250 mg | ORAL_TABLET | Freq: Two times a day (BID) | ORAL | Status: DC

## 2013-06-21 MED ORDER — ISOSORBIDE MONONITRATE ER 60 MG PO TB24: 60.0000 mg | ORAL_TABLET | Freq: Every evening | ORAL | Status: DC

## 2013-06-21 MED ORDER — ZOLPIDEM TARTRATE 5 MG PO TABS: 5.0000 mg | ORAL_TABLET | Freq: Every evening | ORAL | Status: DC | PRN

## 2013-06-21 MED ORDER — VALACYCLOVIR HCL 500 MG PO TABS: 500.0000 mg | ORAL_TABLET | Freq: Two times a day (BID) | ORAL | Status: DC

## 2013-06-21 MED ORDER — HYDROCHLOROTHIAZIDE 25 MG PO TABS: 25.0000 mg | ORAL_TABLET | Freq: Every day | ORAL | Status: DC

## 2013-06-21 NOTE — Progress Notes (Signed)
Patient ID: [name], female   DOB: [birth year], 52 y.o.   MRN: [id number]   Subjective:   Patient ID: [name] female   DOB: 08-Apr-1961 52 y.o.   MRN: [id number]  HPI: [name] is a 52 y.o. female with an extensive PMH documented below.  She presents for follow up for her HTN, CHF, Chronic pain, HLD, and insomnia.  She reports she has been taking her medications as prescribed and does not report any significant side effects. Her chronic pain is due to left knee OA, she has a pain contract with her previous PCP for Percocet 10/325 (#90), she has been evaluated by orthopaedics for a knee replacement and has been told she needs to loose 80 lbs first.  She has been taking Phentermine/topiramate for weight loss and has lost 40 lbs over the past few months. 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  She reports that her pain has been present for over 1 year and has been worse over the past month.  She does not complain of generalized myalgias and reports that she also ran out of crestor about 3 weeks ago.  She denies any focal weakness but does need a walker to get around.  She dose report that she has lost her bowel about 2-3 times over the past year.      Past Medical History  Diagnosis Date  . Cholecystitis     Gall bladder removed   . Hypercholesterolemia   . Fibroids   . HIV (human immunodeficiency virus infection) 05/27/11    CD4 460, VL undetectable 10/12/12  . Pelvic pain in female 10/14/11  . PMB (postmenopausal bleeding)     Since 2010  . Increased BMI 05/27/11  . Anxiety   . Depression   . Hypertension   . H/O varicella   . History of measles, mumps, or rubella   . Blood transfusion   . Hypothyroidism      Reports h/o hypothyroidism; not on any meds, TSH wnl over several years  . 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 Myocardial infarction 07/2004  . COPD (chronic obstructive pulmonary disease)     well controlled, only using albuterol inhaler occasionally   . Pneumonia 1980's    "once"  . Iron deficiency anemia   . Lower GI bleed 04/24/2012    from hemorrhoids. No recent colonoscopy   . 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 Arthritis     "knees"  . CAD in native artery     s/p stent and restenting of LAD. On plavix   Current Outpatient Prescriptions  Medication Sig Dispense Refill  . acetaminophen (TYLENOL) 500 MG tablet Take 500 mg by mouth every 6 (six) hours as needed for pain or fever.      Marland Kitchen albuterol (PROVENTIL HFA;VENTOLIN HFA) 108 (90 BASE) MCG/ACT inhaler Inhale 2 puffs into the lungs every 6 (six) hours as needed. For wheezing and shortness of breath      . albuterol (PROVENTIL) (5  MG/ML) 0.5% nebulizer solution Take 2.5 mg by nebulization every 2 (two) hours as needed. For shortness of breath      . aspirin 81 MG EC tablet Take 1 tablet (81 mg total) by mouth daily. Swallow whole.  30 tablet  12  . carvedilol (COREG) 3.125 MG tablet Take 1 tablet (3.125 mg total) by mouth 2 (two) times daily with a meal. 9am and 6pm  60 tablet  5  . clopidogrel (PLAVIX) 75 MG tablet Take 75 mg by mouth daily.      . diphenoxylate-atropine (LOMOTIL) 2.5-0.025 MG per tablet Take 1 tablet by mouth 4 (four) times daily as needed. For loose stool      . ferrous sulfate 325 (65 FE) MG tablet Take 1 tablet (325 mg total) by mouth 3 (three) times daily with meals.  90 tablet  3  . gabapentin (NEURONTIN) 300 MG capsule Take 300 mg by mouth 3 (three) times daily.      . hydrochlorothiazide (HYDRODIURIL) 25 MG tablet Take 1 tablet (25 mg total) by  mouth daily.  30 tablet  11  . isosorbide mononitrate (IMDUR) 60 MG 24 hr tablet Take 1 tablet (60 mg total) by mouth every evening. 5pm  30 tablet  5  . lisinopril (PRINIVIL,ZESTRIL) 5 MG tablet Take 1 tablet (5 mg total) by mouth daily.  30 tablet  11  . lubiprostone (AMITIZA) 24 MCG capsule Take 1 capsule (24 mcg total) by mouth daily with breakfast.  30 capsule  3  . Multiple Vitamin (MULTIVITAMIN WITH MINERALS) TABS Take 1 tablet by mouth daily.      . nitroGLYCERIN (NITROSTAT) 0.3 MG SL tablet Place 0.3 mg under the tongue every 5 (five) minutes as needed. For chest pain      . oxyCODONE-acetaminophen (PERCOCET) 10-325 MG per tablet Take 1 tablet by mouth every 6 (six) hours as needed for pain.  120 tablet  0  . pantoprazole (PROTONIX) 20 MG tablet TAKE 2 TABLETS BY MOUTH   EVERY DAY  60 tablet  3  . phentermine 37.5 MG capsule Take 1 capsule (37.5 mg total) by mouth every morning.  30 capsule  0  . polyethylene glycol-electrolytes (NULYTELY/GOLYTELY) 420 G solution       . rosuvastatin (CRESTOR) 5 MG tablet Take 1 tablet (5 mg total) by mouth every evening. 5pm      . STRIBILD 150-150-200-300 MG TABS tablet TAKE 1 TABLET BY MOUTH    DAILY WITH BREAKFAST  30 tablet  5  . topiramate (TOPAMAX) 100 MG tablet Take 1 tablet (100 mg total) by mouth daily.  30 tablet  1  . traZODone (DESYREL) 50 MG tablet Take 50 mg by mouth at bedtime.      . valACYclovir (VALTREX) 500 MG tablet Take 1 tablet (500 mg total) by mouth 2 (two) times daily. For flare ups  60 tablet  1  . zolpidem (AMBIEN) 5 MG tablet Take 1 tablet (5 mg total) by mouth at bedtime as needed for sleep.  30 tablet  0   No current facility-administered medications for this visit.   Family History  Problem Relation Age of Onset  . Hypertension Mother   . Hyperlipidemia Mother   . Obesity Mother   . Heart disease Mother   . Hypertension Maternal Aunt   . Hyperlipidemia Maternal Aunt   . Obesity Maternal Aunt   . Stroke Maternal  Aunt    History   Social History  . Marital Status: Single  Spouse Name: N/A    Number of Children: N/A  . Years of Education: N/A   Social History Main Topics  . Smoking status: Former Smoker -- 0.50 packs/day for 29 years    Types: Cigarettes    Quit date: 05/07/2006  . Smokeless tobacco: Never Used  . Alcohol Use: 2.0 oz/week    2-3 Glasses of wine, 4 Drinks containing 0.5 oz of alcohol, 4-6 Cans of beer per week     Comment: 04/24/12 "wine once or twice q couple months"  . Drug Use: No     Comment: 04/24/2012 pt. states has not done any drugs  since 11/2011  . Sexual Activity: Yes    Partners: Male    Birth Control/ Protection:      Comment: gave her condoms   Other Topics Concern  . None   Social History Narrative  . None   Review of Systems: Review of Systems  Constitutional: Positive for weight loss (intentional). Negative for fever, chills and malaise/fatigue.  HENT: Negative for congestion.   Eyes: Negative for blurred vision.  Respiratory: Negative for cough, sputum production and shortness of breath.   Cardiovascular: Negative for chest pain, palpitations and leg swelling.  Gastrointestinal: Negative for heartburn, nausea, vomiting, abdominal pain, diarrhea and constipation.  Genitourinary: Negative for dysuria.  Musculoskeletal: Positive for falls and joint pain. Negative for back pain.  Skin: Negative for rash.  Neurological: Negative for dizziness, tingling, sensory change, focal weakness, weakness and headaches.  Psychiatric/Behavioral: Negative for depression and suicidal ideas. The patient has insomnia.      Objective:  Physical Exam: Filed Vitals:   06/21/13 1512  BP: 170/103  Pulse: 80  Temp: 97.3 F (36.3 C)  TempSrc: Oral  Height: 5\' 2"  (1.575 m)  Weight: 241 lb 12.8 oz (109.68 kg)  SpO2: 100%  Physical Exam  Nursing note and vitals reviewed. Constitutional: She is oriented to person, place, and time. She appears distressed (mild).   HENT:  Head: Normocephalic and atraumatic.  Eyes: Conjunctivae are normal.  Cardiovascular: Normal rate, regular rhythm, normal heart sounds and intact distal pulses.   No murmur heard. Pulmonary/Chest: Effort normal and breath sounds normal. No respiratory distress. She has no wheezes. She has no rales.  Abdominal: Soft. Bowel sounds are normal. She exhibits no distension. There is no tenderness.  Genitourinary: Uterus normal.  Musculoskeletal: She exhibits no edema.       Right knee: She exhibits normal range of motion.       Left knee: She exhibits normal range of motion. Tenderness found.       Right ankle: She exhibits normal range of motion and no swelling.       Left ankle: She exhibits normal range of motion and no swelling.  5/5 muscle strength of lower extremity B/L.  Neurological: She is alert and oriented to person, place, and time. She has normal sensation and normal strength. She displays abnormal stance. Gait abnormal.  Walks with walker.  Skin: Skin is warm and dry. She is not diaphoretic.  Psychiatric: Affect and judgment normal.    Assessment & Plan:   See Problem Based Assessment and Plan Meds ordered this encounter  Medications  . DISCONTD: oxyCODONE-acetaminophen (PERCOCET) 10-325 MG per tablet    Sig: Take 1 tablet by mouth every 6 (six) hours as needed for pain.    Dispense:  120 tablet    Refill:  0  . DISCONTD: oxyCODONE-acetaminophen (PERCOCET) 10-325 MG per tablet    Sig: Take  1 tablet by mouth every 6 (six) hours as needed for pain.    Dispense:  120 tablet    Refill:  0    Rx 2 of 3. Pls fill 30 days after printed Rx date  . oxyCODONE-acetaminophen (PERCOCET) 10-325 MG per tablet    Sig: Take 1 tablet by mouth every 6 (six) hours as needed for pain.    Dispense:  120 tablet    Refill:  0    Rx 3 of 3. Pls fill 60 days after printed Rx date  . DISCONTD: phentermine 37.5 MG capsule    Sig: Take 1 capsule (37.5 mg total) by mouth every morning.     Dispense:  30 capsule    Refill:  0    Rx 1 of 2.  . phentermine 37.5 MG capsule    Sig: Take 1 capsule (37.5 mg total) by mouth every morning.    Dispense:  30 capsule    Refill:  0    Rx 2 of 2. Pls fill 30 days after printed Rx date  . topiramate (TOPAMAX) 100 MG tablet    Sig: Take 1 tablet (100 mg total) by mouth daily.    Dispense:  30 tablet    Refill:  1  . zolpidem (AMBIEN) 5 MG tablet    Sig: Take 1 tablet (5 mg total) by mouth at bedtime as needed for sleep.    Dispense:  30 tablet    Refill:  0  . valACYclovir (VALTREX) 500 MG tablet    Sig: Take 1 tablet (500 mg total) by mouth 2 (two) times daily. For flare ups    Dispense:  60 tablet    Refill:  1  . lubiprostone (AMITIZA) 24 MCG capsule    Sig: Take 1 capsule (24 mcg total) by mouth daily with breakfast.    Dispense:  30 capsule    Refill:  3  . isosorbide mononitrate (IMDUR) 60 MG 24 hr tablet    Sig: Take 1 tablet (60 mg total) by mouth every evening. 5pm    Dispense:  30 tablet    Refill:  5  . hydrochlorothiazide (HYDRODIURIL) 25 MG tablet    Sig: Take 1 tablet (25 mg total) by mouth daily.    Dispense:  30 tablet    Refill:  11  . carvedilol (COREG) 3.125 MG tablet    Sig: Take 1 tablet (3.125 mg total) by mouth 2 (two) times daily with a meal. 9am and 6pm    Dispense:  60 tablet    Refill:  5

## 2013-06-21 NOTE — Assessment & Plan Note (Signed)
Patient denies DOE, lower extremity edema. Continue current medications.

## 2013-06-21 NOTE — Assessment & Plan Note (Signed)
Patient has been told if she loses ~80 lbs she will be a candidate for knee replacement. We are currently treating her left knee OA with narcotic pain medication.  If she can get this surgery we may be able to decrease/ stop narcotic pain medications.  She has been treated with phentermine/topiramate by her orthopedic physicians with good success. However she reports she cannot afford to keep going to see them just for the weight loss prescription. She has already lost over 40 lbs in the past few months.  I explained the risks associated with these medications and told her that I would only prescribe it as long as she continues to loose weight.

## 2013-06-21 NOTE — Assessment & Plan Note (Signed)
BP Readings from Last 3 Encounters:  06/21/13 170/103  04/04/13 131/76  02/14/13 140/86    Lab Results  Component Value Date   NA 144 06/19/2013   K 4.5 06/19/2013   CREATININE 0.66 06/19/2013    Assessment: Blood pressure control: moderately elevated Progress toward BP goal:  deteriorated Comments: Patient in pain today.  Was previously well controlled  Plan: Medications:  continue current medications Lisinopril 5mg  QD, Coreg 3.125 BID, HCTZ 25mg  QD Educational resources provided:   Self management tools provided: home blood pressure logbook Other plans: Will reassess BP at next visit, hopefully with pain better controlled.

## 2013-06-21 NOTE — Assessment & Plan Note (Addendum)
[name] is treated for L knee OA with Percocet 10/325 mg. She is prescribed 90 per month. She takes 4-6 on a typical day, 6-8 on a bad day, and 0 on a good day. She has about 5 good days per month and 20 bad days per month. Her reported usage is not consistent with the prescribed amount. She has not had red flags regarding her opioid usage.  I will update her pain contract to number #120.   I have printed off or called in 3 months of scripts today and gave one to the patient and held the other two in triage. [name] will return in 3 months .  After patient left she reported that she was unable to get pain medication filled at pharmacy due to recently filling previous prescription on 06/15/13.  If patient had used 4 pills every 6 hours she would have already used 24 of her 30 pills.

## 2013-06-21 NOTE — Assessment & Plan Note (Signed)
Patient has a history of insomnia, has been treated with Ambien 10mg  in the past, reports she typically uses Ambien only 3-5 times a month.  I warned her that a dose above 5mg  a night has a black box warning for women.  I told her I was not comfortable prescribing a dose above 5mg . She is agreeable to this and reports she will only take Ambien sparingly.

## 2013-06-21 NOTE — Assessment & Plan Note (Signed)
Patient's Lipid panel was checked a few days ago.  Patient's LDL is elevated to 144 and likely needs increase in crestor dosage.  However patient currently has lower extremity leg pains and I will hold off on making any changes to her medication at this time until her pain is better controlled.

## 2013-06-21 NOTE — Assessment & Plan Note (Signed)
Patient's B/L lower extremity pain sounds neuropathic in nature.  Unclear etiology at this time as the patient does not have diabetes, she does have HIV but is well controlled.  Will increase gabapentin to 400mg  TID.

## 2013-06-21 NOTE — Telephone Encounter (Signed)
Patient states that she was given a rx for percocet 120 instead of 90 today in the clinic due to her pain which she took to the pharmacy [facility] [address]. She states that they wanted to talk to the doctor before filling the new rx amount. Verified in epic that Dr. Mikey Bussing did prescribe her the different amount and told her that I would call the pharmacy and verify that this amount was correct. She is thankful and states on her way to pharmacy.   Called pharmacy and they stated the issue is that she filled an rx for 90 percocet on 06/15/13 and that they were concerned that since the direction on the rx had not changed (still 1 q 6 hours prn) that she should not be out (as this is a 22 day supply with sig) and she states that she was entirely out of medication. Given the class of narcotic they are unable to fill a partial script (i.e. The 30 pill difference between the two scripts). Advised the pharmacist that we are unable to give rxs overnight and she will have to wait until the morning and call the clinic for clarification.   Held on the line as patient had arrived at the pharmacy and she was fine with the response from the pharmacist and will contact the clinic in the morning.   Will forward this to the MD who saw the patient in clinic today for his judgement on whether any new rxes should be issued to the patient and if new rx the sig may need to be adjusted to reflect the change in usage.   Genella Mech 7:32 PM 06/21/2013

## 2013-06-21 NOTE — Assessment & Plan Note (Signed)
Patient reports periodic outbreaks.  Requests refill of Valacyclovir to take when she has an outbreak.

## 2013-06-22 ENCOUNTER — Encounter: Payer: Self-pay | Admitting: Internal Medicine

## 2013-06-22 ENCOUNTER — Encounter: Payer: Self-pay | Admitting: *Deleted

## 2013-06-22 ENCOUNTER — Other Ambulatory Visit: Payer: Commercial Managed Care - HMO

## 2013-06-22 MED ORDER — OXYCODONE-ACETAMINOPHEN 10-325 MG PO TABS: 1.0000 | ORAL_TABLET | Freq: Four times a day (QID) | ORAL | Status: DC | PRN

## 2013-06-22 MED ORDER — ZOLPIDEM TARTRATE 5 MG PO TABS: 5.0000 mg | ORAL_TABLET | Freq: Every evening | ORAL | Status: DC | PRN

## 2013-06-22 NOTE — Assessment & Plan Note (Signed)
06/22/13 update Patient was unable to have script filled at pharmacy as she had recently filled her Percocet prescription for #90 pills.  She did admit during our visit to heavy use over the past number of days.  I called her this morning and asked her how many pills she had left from that prescription she reported maybe 30-40 but was away from home and could not count. I told her I would adjust her prescription so she could get it filled but she would have to come in and [facility]. I also instructed her to bring her pill bottle for a count.  She was agreeable.   She did confirm to me that she is taking the medication and said she had already taken 2 that morning.  I ordered a random UDS for her to be collected when she comes. But did not tell her about this.  I was called later in the day by the triage nurse that the patient did not wish to give a urine sample because she did not need to go but eventually agreed after being told that it was part of the contract.  Her pill count only revealed 10 pills and she reported that her granddaughter spilled many of them into the toilet this morning.  THIS IS A CLEAR RED FLAG. However the patient does have a ligetament source of pain and has been very compliant in the past.  As she is leaving tomorrow morning on a cruise I allowed her to have the script to be filled but will need to call her in for another UDS in the next 1-2 weeks. I will also place a flag on her chart to alert for the potential for abuse of the Medication contract.

## 2013-06-22 NOTE — Patient Instructions (Signed)
You have been prescribed Phentermine and Topiramate  for weight loss, as these drugs have clear benefits they also have risks.  I will only continue to fill these prescriptions as you continue to have healthy weight loss (congratulations again on the 40 lbs loss). I have refilled your pain medication but I do want to remind you that you may be called in for pill counts and random drug screens as part of the contract.

## 2013-06-22 NOTE — Progress Notes (Unsigned)
Pt presented to switch out script, have pill count and submit a uds. She states she cannot urinate because she just went to restroom, she is given a cup of coffee and a can of ginger ale and cup of ice. She has 10 pills in the bottle and states she usually keeps her pills up in a safe at home but she messed up this am and her grdaughter got in them and spilled some in the floor and some in the commode, the ones that went in the floor landed in dog poop so they had to be thrown away. Called dr Mikey Bussing, will still give script to pt after uds. When pt returns from week cruise she will be randomly called for a new uds

## 2013-06-23 LAB — PRESCRIPTION ABUSE MONITORING 15P, URINE
Barbiturate Screen, Urine: NEGATIVE ng/mL
Benzodiazepine Screen, Urine: NEGATIVE ng/mL
Buprenorphine, Urine: NEGATIVE ng/mL
Carisoprodol, Urine: NEGATIVE ng/mL
Fentanyl, Ur: NEGATIVE ng/mL
Meperidine, Ur: NEGATIVE ng/mL
Oxycodone Screen, Ur: NEGATIVE ng/mL
Propoxyphene: NEGATIVE ng/mL
Tramadol Scrn, Ur: NEGATIVE ng/mL

## 2013-06-25 ENCOUNTER — Telehealth: Payer: Self-pay | Admitting: *Deleted

## 2013-06-25 ENCOUNTER — Other Ambulatory Visit: Payer: Self-pay | Admitting: *Deleted

## 2013-06-25 MED ORDER — ROSUVASTATIN CALCIUM 5 MG PO TABS: 5.0000 mg | ORAL_TABLET | Freq: Every evening | ORAL | Status: DC

## 2013-06-25 NOTE — Telephone Encounter (Signed)
Pt had called about med rxs, which were sent to [facility] pharmacy instead of [facility]. Stated she only get her HIV  And Protonix meds from [facility]. So, HCTZ, Gabapentin, Coreg, Amitiza, Topamax, Valtrex, and Imdur were called to [facility] and [facility] pharmacy informed to disregard refills sent to them - talked to [place].

## 2013-06-26 ENCOUNTER — Telehealth: Payer: Self-pay | Admitting: *Deleted

## 2013-06-26 NOTE — Telephone Encounter (Signed)
She should refill on 31st.

## 2013-06-26 NOTE — Telephone Encounter (Signed)
Pt called stating [facility] will not fill her Percocet Rx until 10/31 because the directions states every 6 hours and pt thinks they need to state take every 4 hours. Rx was increased from # 90 to #  120 at last visit. I called pharmacy and they state pt has called them several times trying to get percocet early. First pt stated she was going on a cruise, next some pills fell in toilet.  Red Flag  Rx last filled 10/10 for # 90. This should last until the 31st if she takes 4 a day.  Your Rx reads 1 - 2  Every 6 hours.   Pt states she is almost out of the 90 she got on the 10th.   Also UDS was not appropriate   When do you want her to refill?

## 2013-06-27 NOTE — Progress Notes (Signed)
I saw and evaluated the patient.  I personally confirmed the key portions of Dr. Neita Garnet history and exam and reviewed pertinent patient test results.  The assessment, diagnosis, and plan were formulated together and I agree with the documentation in the resident's note.  We also discussed the red flag behavior concerning the narcotics and I support the plan that is in place as outlined in Dr. Neita Garnet note.

## 2013-06-28 ENCOUNTER — Encounter: Payer: Self-pay | Admitting: Internal Medicine

## 2013-06-28 ENCOUNTER — Ambulatory Visit (INDEPENDENT_AMBULATORY_CARE_PROVIDER_SITE_OTHER): Payer: Medicare HMO | Admitting: Internal Medicine

## 2013-06-28 VITALS — BP 143/93 | HR 103 | Temp 98.6°F | Ht 62.0 in | Wt 241.0 lb

## 2013-06-28 DIAGNOSIS — E785 Hyperlipidemia, unspecified: Secondary | ICD-10-CM

## 2013-06-28 DIAGNOSIS — G8929 Other chronic pain: Secondary | ICD-10-CM

## 2013-06-28 DIAGNOSIS — M545 Low back pain, unspecified: Secondary | ICD-10-CM

## 2013-06-28 DIAGNOSIS — Z23 Encounter for immunization: Secondary | ICD-10-CM

## 2013-06-28 DIAGNOSIS — B20 Human immunodeficiency virus [HIV] disease: Secondary | ICD-10-CM

## 2013-06-28 NOTE — Progress Notes (Signed)
  Subjective:    Patient ID: [name], female    DOB: 06-19-1961, 52 y.o.   MRN: [id number]  HPI  She comes in for followup of her HIV.she is on Stribild and reports excellent compliance with no missed doses. Her CD4 count is over 500 and her viral load remains undetectable.She has lost 40 pounds so far at the recommendation of her orthopedist who will consider knee replacements and was recommended she lose 60 pounds. She is motivated to lose this swishing in her knees replaced. She is on a chronic narcotic medication management her primary physician.  No fever, no chills.     Review of Systems  Constitutional: Negative for fatigue and unexpected weight change.  HENT: Negative for sore throat and trouble swallowing.   Gastrointestinal: Negative for diarrhea.  Musculoskeletal: Positive for arthralgias and joint swelling. Negative for myalgias.  Skin: Negative for rash.  Neurological: Negative for dizziness and headaches.  Hematological: Negative for adenopathy.  Psychiatric/Behavioral: Negative for dysphoric mood.       Objective:   Physical Exam  Constitutional: She appears well-developed and well-nourished. No distress.  obese  HENT:  Mouth/Throat: No oropharyngeal exudate.  Eyes: No scleral icterus.  Cardiovascular: Normal rate, regular rhythm and normal heart sounds.   No murmur heard. Pulmonary/Chest: Effort normal. No respiratory distress. She has no wheezes.  Lymphadenopathy:    She has no cervical adenopathy.  Neurological: She is alert.  Skin: No rash noted.  Psychiatric:  tearful          Assessment & Plan:

## 2013-06-29 NOTE — Assessment & Plan Note (Signed)
Pain being addressed by PCP with chronic medications

## 2013-06-29 NOTE — Assessment & Plan Note (Signed)
LDL elevated compared to previous, Statin allergy.  With weight loss,will continue to monitor and encouraged continued lifestyle modifications

## 2013-06-29 NOTE — Assessment & Plan Note (Signed)
Doing well with a suppressed virus, good CD4   RTC in 4 months

## 2013-07-04 ENCOUNTER — Telehealth: Payer: Self-pay | Admitting: *Deleted

## 2013-07-04 NOTE — Telephone Encounter (Signed)
Pt here at the clinic - requesting Percocet rx to be refilled today instead of on the 30th; states she has a flight to catch today.

## 2013-07-04 NOTE — Telephone Encounter (Signed)
Patient received Percocet #120 pills on 06/22/2013 by Dr. Mikey Bussing. I am not going to refill this at this time. She will be due next month on the 17th Novemeber.

## 2013-07-04 NOTE — Telephone Encounter (Signed)
Sending this to Dr. Mikey Bussing. I would request him to reassess and decide if the patient might need a UDS next visit.

## 2013-07-04 NOTE — Telephone Encounter (Signed)
Talked w/ Dr Dalphine Handing and after reading encounter 10/21. Last rx given to pt by Dr Lou Miner; per note can be filled on 10/31. Dr Dalphine Handing agreed to let pt get this rx filled today; [facility] pharmacist and pt informed. Also pt made awared this will be a one time event; she voiced understanding.

## 2013-07-13 ENCOUNTER — Encounter (HOSPITAL_COMMUNITY): Payer: Self-pay | Admitting: Emergency Medicine

## 2013-07-13 ENCOUNTER — Emergency Department (HOSPITAL_COMMUNITY)
Admission: EM | Admit: 2013-07-13 | Discharge: 2013-07-14 | Disposition: A | Discharge status: Home / Self Care | Payer: Medicare PPO | Attending: Emergency Medicine | Admitting: Emergency Medicine

## 2013-07-13 DIAGNOSIS — F3289 Other specified depressive episodes: Secondary | ICD-10-CM | POA: Insufficient documentation

## 2013-07-13 DIAGNOSIS — I252 Old myocardial infarction: Secondary | ICD-10-CM | POA: Insufficient documentation

## 2013-07-13 DIAGNOSIS — J4489 Other specified chronic obstructive pulmonary disease: Secondary | ICD-10-CM | POA: Insufficient documentation

## 2013-07-13 DIAGNOSIS — Z79899 Other long term (current) drug therapy: Secondary | ICD-10-CM | POA: Insufficient documentation

## 2013-07-13 DIAGNOSIS — J40 Bronchitis, not specified as acute or chronic: Secondary | ICD-10-CM

## 2013-07-13 DIAGNOSIS — J449 Chronic obstructive pulmonary disease, unspecified: Secondary | ICD-10-CM | POA: Insufficient documentation

## 2013-07-13 DIAGNOSIS — Z8742 Personal history of other diseases of the female genital tract: Secondary | ICD-10-CM | POA: Insufficient documentation

## 2013-07-13 DIAGNOSIS — F329 Major depressive disorder, single episode, unspecified: Secondary | ICD-10-CM | POA: Insufficient documentation

## 2013-07-13 DIAGNOSIS — I10 Essential (primary) hypertension: Secondary | ICD-10-CM | POA: Insufficient documentation

## 2013-07-13 DIAGNOSIS — I2510 Atherosclerotic heart disease of native coronary artery without angina pectoris: Secondary | ICD-10-CM | POA: Insufficient documentation

## 2013-07-13 DIAGNOSIS — Z8673 Personal history of transient ischemic attack (TIA), and cerebral infarction without residual deficits: Secondary | ICD-10-CM | POA: Insufficient documentation

## 2013-07-13 DIAGNOSIS — E7800 Pure hypercholesterolemia, unspecified: Secondary | ICD-10-CM | POA: Insufficient documentation

## 2013-07-13 DIAGNOSIS — Z7902 Long term (current) use of antithrombotics/antiplatelets: Secondary | ICD-10-CM | POA: Insufficient documentation

## 2013-07-13 DIAGNOSIS — Z8719 Personal history of other diseases of the digestive system: Secondary | ICD-10-CM | POA: Insufficient documentation

## 2013-07-13 DIAGNOSIS — F411 Generalized anxiety disorder: Secondary | ICD-10-CM | POA: Insufficient documentation

## 2013-07-13 DIAGNOSIS — Z8619 Personal history of other infectious and parasitic diseases: Secondary | ICD-10-CM | POA: Insufficient documentation

## 2013-07-13 DIAGNOSIS — D509 Iron deficiency anemia, unspecified: Secondary | ICD-10-CM | POA: Insufficient documentation

## 2013-07-13 DIAGNOSIS — Z8701 Personal history of pneumonia (recurrent): Secondary | ICD-10-CM | POA: Insufficient documentation

## 2013-07-13 DIAGNOSIS — I509 Heart failure, unspecified: Secondary | ICD-10-CM | POA: Insufficient documentation

## 2013-07-13 DIAGNOSIS — Z21 Asymptomatic human immunodeficiency virus [HIV] infection status: Secondary | ICD-10-CM | POA: Insufficient documentation

## 2013-07-13 DIAGNOSIS — M1710 Unilateral primary osteoarthritis, unspecified knee: Secondary | ICD-10-CM | POA: Insufficient documentation

## 2013-07-13 DIAGNOSIS — Z87891 Personal history of nicotine dependence: Secondary | ICD-10-CM | POA: Insufficient documentation

## 2013-07-13 DIAGNOSIS — Z9861 Coronary angioplasty status: Secondary | ICD-10-CM | POA: Insufficient documentation

## 2013-07-13 NOTE — ED Notes (Signed)
The pt has had a cold for one week with chest congestion and cough. Aching and chills.  No temp yet

## 2013-07-14 ENCOUNTER — Emergency Department (HOSPITAL_COMMUNITY): Payer: Medicare PPO

## 2013-07-14 LAB — BASIC METABOLIC PANEL
BUN: 15 mg/dL (ref 6–23)
CO2: 21 mEq/L (ref 19–32)
Calcium: 9.6 mg/dL (ref 8.4–10.5)
Creatinine, Ser: 0.8 mg/dL (ref 0.50–1.10)
GFR calc Af Amer: >90 mL/min (ref >90)

## 2013-07-14 LAB — CBC WITH DIFFERENTIAL/PLATELET
Basophils Absolute: 0 10*3/uL (ref 0.0–0.1)
Basophils Relative: 1 % (ref 0–1)
Eosinophils Absolute: 0.1 10*3/uL (ref 0.0–0.7)
Eosinophils Relative: 2 % (ref 0–5)
HCT: 35.2 % — ABNORMAL LOW (ref 36.0–46.0)
Hemoglobin: 11.4 g/dL — ABNORMAL LOW (ref 12.0–15.0)
MCHC: 32.4 g/dL (ref 30.0–36.0)
MCV: 79.8 fL (ref 78.0–100.0)
Monocytes Absolute: 0.6 10*3/uL (ref 0.1–1.0)
Monocytes Relative: 9 % (ref 3–12)
RDW: 14.9 % (ref 11.5–15.5)

## 2013-07-14 MED ORDER — BENZONATATE 100 MG PO CAPS: 100.0000 mg | ORAL_CAPSULE | Freq: Three times a day (TID) | ORAL | Status: DC

## 2013-07-14 MED ORDER — IOHEXOL 350 MG/ML SOLN
100.0000 mL | Freq: Once | INTRAVENOUS | Status: AC | PRN
  Administered 2013-07-14: 100 mL via INTRAVENOUS

## 2013-07-14 MED ORDER — IOHEXOL 300 MG/ML  SOLN: 100.0000 mL | Freq: Once | INTRAMUSCULAR | Status: DC | PRN

## 2013-07-14 MED ORDER — OXYCODONE-ACETAMINOPHEN 5-325 MG PO TABS
2.0000 | ORAL_TABLET | Freq: Once | ORAL | Status: AC
  Administered 2013-07-14: 2 via ORAL
  Filled 2013-07-14: qty 2

## 2013-07-14 MED ORDER — ALBUTEROL SULFATE HFA 108 (90 BASE) MCG/ACT IN AERS
2.0000 | INHALATION_SPRAY | RESPIRATORY_TRACT | Status: AC
  Administered 2013-07-14: 2 via RESPIRATORY_TRACT
  Filled 2013-07-14: qty 6.7

## 2013-07-14 MED ORDER — BENZONATATE 100 MG PO CAPS
100.0000 mg | ORAL_CAPSULE | Freq: Once | ORAL | Status: AC
  Administered 2013-07-14: 100 mg via ORAL
  Filled 2013-07-14: qty 1

## 2013-07-14 NOTE — ED Notes (Signed)
Patient transported to ER from CT

## 2013-07-14 NOTE — ED Notes (Signed)
Patient with noted chronic right leg pain, appears to flare up causing intense pain, (pt takes percocet at home). Pt gone for ct will medicated with percocet when pt returns

## 2013-07-14 NOTE — ED Notes (Signed)
Patient transported to to er from x-ray

## 2013-07-14 NOTE — ED Notes (Signed)
Patient transported to CT 
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
## 2013-07-30 ENCOUNTER — Other Ambulatory Visit: Payer: Self-pay | Admitting: *Deleted

## 2013-07-30 DIAGNOSIS — M1712 Unilateral primary osteoarthritis, left knee: Secondary | ICD-10-CM

## 2013-07-30 NOTE — Telephone Encounter (Signed)
Please print 3 months worth of pain med if you would like

## 2013-07-30 NOTE — Telephone Encounter (Signed)
There should already be printed scripts for both of these medications.

## 2013-08-01 NOTE — Telephone Encounter (Signed)
Pt was given Rx for percocet, I do not find any Rx for Phentermine. Pt would like to pick up Monday.

## 2013-08-02 MED ORDER — PHENTERMINE HCL 37.5 MG PO CAPS: 37.5000 mg | ORAL_CAPSULE | ORAL | Status: DC

## 2013-08-02 NOTE — Telephone Encounter (Signed)
Printed, Signed and left in Med Sample Room.

## 2013-08-08 ENCOUNTER — Encounter (HOSPITAL_COMMUNITY): Payer: Self-pay | Admitting: Emergency Medicine

## 2013-08-08 ENCOUNTER — Emergency Department (HOSPITAL_COMMUNITY)
Admission: EM | Admit: 2013-08-08 | Discharge: 2013-08-08 | Disposition: A | Discharge status: Home / Self Care | Payer: Medicare HMO | Attending: Emergency Medicine | Admitting: Emergency Medicine

## 2013-08-08 ENCOUNTER — Emergency Department (HOSPITAL_COMMUNITY): Payer: Medicare HMO

## 2013-08-08 DIAGNOSIS — I10 Essential (primary) hypertension: Secondary | ICD-10-CM | POA: Insufficient documentation

## 2013-08-08 DIAGNOSIS — I252 Old myocardial infarction: Secondary | ICD-10-CM | POA: Insufficient documentation

## 2013-08-08 DIAGNOSIS — Z9861 Coronary angioplasty status: Secondary | ICD-10-CM | POA: Insufficient documentation

## 2013-08-08 DIAGNOSIS — Z8619 Personal history of other infectious and parasitic diseases: Secondary | ICD-10-CM | POA: Insufficient documentation

## 2013-08-08 DIAGNOSIS — R269 Unspecified abnormalities of gait and mobility: Secondary | ICD-10-CM | POA: Insufficient documentation

## 2013-08-08 DIAGNOSIS — Z8701 Personal history of pneumonia (recurrent): Secondary | ICD-10-CM | POA: Insufficient documentation

## 2013-08-08 DIAGNOSIS — F411 Generalized anxiety disorder: Secondary | ICD-10-CM | POA: Insufficient documentation

## 2013-08-08 DIAGNOSIS — S46909A Unspecified injury of unspecified muscle, fascia and tendon at shoulder and upper arm level, unspecified arm, initial encounter: Secondary | ICD-10-CM | POA: Insufficient documentation

## 2013-08-08 DIAGNOSIS — M25562 Pain in left knee: Secondary | ICD-10-CM

## 2013-08-08 DIAGNOSIS — I509 Heart failure, unspecified: Secondary | ICD-10-CM | POA: Insufficient documentation

## 2013-08-08 DIAGNOSIS — Z8673 Personal history of transient ischemic attack (TIA), and cerebral infarction without residual deficits: Secondary | ICD-10-CM | POA: Insufficient documentation

## 2013-08-08 DIAGNOSIS — W010XXA Fall on same level from slipping, tripping and stumbling without subsequent striking against object, initial encounter: Secondary | ICD-10-CM | POA: Insufficient documentation

## 2013-08-08 DIAGNOSIS — D509 Iron deficiency anemia, unspecified: Secondary | ICD-10-CM | POA: Insufficient documentation

## 2013-08-08 DIAGNOSIS — Y939 Activity, unspecified: Secondary | ICD-10-CM | POA: Insufficient documentation

## 2013-08-08 DIAGNOSIS — M1710 Unilateral primary osteoarthritis, unspecified knee: Secondary | ICD-10-CM | POA: Insufficient documentation

## 2013-08-08 DIAGNOSIS — Z79899 Other long term (current) drug therapy: Secondary | ICD-10-CM | POA: Insufficient documentation

## 2013-08-08 DIAGNOSIS — S4980XA Other specified injuries of shoulder and upper arm, unspecified arm, initial encounter: Secondary | ICD-10-CM | POA: Insufficient documentation

## 2013-08-08 DIAGNOSIS — J4489 Other specified chronic obstructive pulmonary disease: Secondary | ICD-10-CM | POA: Insufficient documentation

## 2013-08-08 DIAGNOSIS — Y929 Unspecified place or not applicable: Secondary | ICD-10-CM | POA: Insufficient documentation

## 2013-08-08 DIAGNOSIS — F329 Major depressive disorder, single episode, unspecified: Secondary | ICD-10-CM | POA: Insufficient documentation

## 2013-08-08 DIAGNOSIS — W19XXXA Unspecified fall, initial encounter: Secondary | ICD-10-CM

## 2013-08-08 DIAGNOSIS — Z87891 Personal history of nicotine dependence: Secondary | ICD-10-CM | POA: Insufficient documentation

## 2013-08-08 DIAGNOSIS — Z7902 Long term (current) use of antithrombotics/antiplatelets: Secondary | ICD-10-CM | POA: Insufficient documentation

## 2013-08-08 DIAGNOSIS — S8990XA Unspecified injury of unspecified lower leg, initial encounter: Secondary | ICD-10-CM | POA: Insufficient documentation

## 2013-08-08 DIAGNOSIS — I2510 Atherosclerotic heart disease of native coronary artery without angina pectoris: Secondary | ICD-10-CM | POA: Insufficient documentation

## 2013-08-08 DIAGNOSIS — M25512 Pain in left shoulder: Secondary | ICD-10-CM

## 2013-08-08 DIAGNOSIS — Z8742 Personal history of other diseases of the female genital tract: Secondary | ICD-10-CM | POA: Insufficient documentation

## 2013-08-08 DIAGNOSIS — Z7982 Long term (current) use of aspirin: Secondary | ICD-10-CM | POA: Insufficient documentation

## 2013-08-08 DIAGNOSIS — Z21 Asymptomatic human immunodeficiency virus [HIV] infection status: Secondary | ICD-10-CM | POA: Insufficient documentation

## 2013-08-08 DIAGNOSIS — J449 Chronic obstructive pulmonary disease, unspecified: Secondary | ICD-10-CM | POA: Insufficient documentation

## 2013-08-08 DIAGNOSIS — F3289 Other specified depressive episodes: Secondary | ICD-10-CM | POA: Insufficient documentation

## 2013-08-08 DIAGNOSIS — E7800 Pure hypercholesterolemia, unspecified: Secondary | ICD-10-CM | POA: Insufficient documentation

## 2013-08-08 MED ORDER — NAPROXEN 500 MG PO TABS: 500.0000 mg | ORAL_TABLET | Freq: Two times a day (BID) | ORAL | Status: DC
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
## 2013-08-08 NOTE — ED Notes (Addendum)
Larey Seat a week ago, pain to left leg. Also c/o left shoulder pain.

## 2013-08-09 NOTE — ED Provider Notes (Signed)
Medical screening examination/treatment/procedure(s) were performed by non-physician practitioner and as supervising physician I was immediately available for consultation/collaboration.  EKG Interpretation   None        Juliet Rude. Rubin Payor, MD 08/09/13 2320

## 2013-08-26 ENCOUNTER — Encounter (HOSPITAL_COMMUNITY): Payer: Self-pay | Admitting: Emergency Medicine

## 2013-08-26 ENCOUNTER — Emergency Department (INDEPENDENT_AMBULATORY_CARE_PROVIDER_SITE_OTHER): Payer: Commercial Managed Care - HMO

## 2013-08-26 ENCOUNTER — Emergency Department (INDEPENDENT_AMBULATORY_CARE_PROVIDER_SITE_OTHER)
Admission: EM | Admit: 2013-08-26 | Discharge: 2013-08-26 | Disposition: A | Discharge status: Home / Self Care | Payer: Commercial Managed Care - HMO | Source: Home / Self Care | Attending: Family Medicine | Admitting: Family Medicine

## 2013-08-26 DIAGNOSIS — S93409A Sprain of unspecified ligament of unspecified ankle, initial encounter: Secondary | ICD-10-CM

## 2013-08-26 DIAGNOSIS — S40019A Contusion of unspecified shoulder, initial encounter: Secondary | ICD-10-CM

## 2013-08-26 DIAGNOSIS — L0100 Impetigo, unspecified: Secondary | ICD-10-CM

## 2013-08-26 DIAGNOSIS — IMO0002 Reserved for concepts with insufficient information to code with codable children: Secondary | ICD-10-CM

## 2013-08-26 MED ORDER — BACITRACIN ZINC 500 UNIT/GM EX OINT: 1.0000 "application " | TOPICAL_OINTMENT | Freq: Two times a day (BID) | CUTANEOUS | Status: DC

## 2013-08-26 NOTE — ED Notes (Addendum)
C/o right ankle pain and right shoulder pain due to falling at [employer] 08/24/13 States she stepped on the rails that was laying on the floor. States she fell against the shelves    C/o of an ulcer in right nostril  States she would normal get some medicad Vaseline for her nostrils
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
## 2013-08-27 NOTE — ED Provider Notes (Signed)
Medical screening examination/treatment/procedure(s) were performed by a resident physician or non-physician practitioner and as the supervising physician I was immediately available for consultation/collaboration.  Thomson Herbers, MD    Fatime Biswell S Errika Narvaiz, MD 08/27/13 0743 

## 2013-08-28 ENCOUNTER — Other Ambulatory Visit: Payer: Self-pay | Admitting: Internal Medicine

## 2013-08-28 DIAGNOSIS — E669 Obesity, unspecified: Secondary | ICD-10-CM

## 2013-09-14 ENCOUNTER — Telehealth: Payer: Self-pay | Admitting: *Deleted

## 2013-09-14 ENCOUNTER — Inpatient Hospital Stay (HOSPITAL_COMMUNITY): Payer: Medicare HMO

## 2013-09-14 ENCOUNTER — Encounter (HOSPITAL_COMMUNITY): Payer: Self-pay | Admitting: Emergency Medicine

## 2013-09-14 ENCOUNTER — Emergency Department (HOSPITAL_COMMUNITY): Payer: Medicare HMO

## 2013-09-14 ENCOUNTER — Inpatient Hospital Stay (HOSPITAL_COMMUNITY)
Admission: EM | Admit: 2013-09-14 | Discharge: 2013-09-15 | DRG: 065 | Disposition: A | Discharge status: Home Health | Payer: Medicare HMO | Attending: Internal Medicine | Admitting: Internal Medicine

## 2013-09-14 DIAGNOSIS — B20 Human immunodeficiency virus [HIV] disease: Secondary | ICD-10-CM | POA: Diagnosis present

## 2013-09-14 DIAGNOSIS — Q2111 Secundum atrial septal defect: Secondary | ICD-10-CM

## 2013-09-14 DIAGNOSIS — Z7982 Long term (current) use of aspirin: Secondary | ICD-10-CM

## 2013-09-14 DIAGNOSIS — I959 Hypotension, unspecified: Secondary | ICD-10-CM | POA: Diagnosis present

## 2013-09-14 DIAGNOSIS — F411 Generalized anxiety disorder: Secondary | ICD-10-CM | POA: Diagnosis present

## 2013-09-14 DIAGNOSIS — I6350 Cerebral infarction due to unspecified occlusion or stenosis of unspecified cerebral artery: Principal | ICD-10-CM | POA: Diagnosis present

## 2013-09-14 DIAGNOSIS — Z21 Asymptomatic human immunodeficiency virus [HIV] infection status: Secondary | ICD-10-CM | POA: Diagnosis present

## 2013-09-14 DIAGNOSIS — E039 Hypothyroidism, unspecified: Secondary | ICD-10-CM | POA: Diagnosis present

## 2013-09-14 DIAGNOSIS — G4733 Obstructive sleep apnea (adult) (pediatric): Secondary | ICD-10-CM | POA: Diagnosis present

## 2013-09-14 DIAGNOSIS — I509 Heart failure, unspecified: Secondary | ICD-10-CM | POA: Diagnosis present

## 2013-09-14 DIAGNOSIS — Z6841 Body Mass Index (BMI) 40.0 and over, adult: Secondary | ICD-10-CM

## 2013-09-14 DIAGNOSIS — Z7902 Long term (current) use of antithrombotics/antiplatelets: Secondary | ICD-10-CM

## 2013-09-14 DIAGNOSIS — Z9119 Patient's noncompliance with other medical treatment and regimen: Secondary | ICD-10-CM

## 2013-09-14 DIAGNOSIS — Z79899 Other long term (current) drug therapy: Secondary | ICD-10-CM

## 2013-09-14 DIAGNOSIS — R209 Unspecified disturbances of skin sensation: Secondary | ICD-10-CM | POA: Diagnosis present

## 2013-09-14 DIAGNOSIS — Z87891 Personal history of nicotine dependence: Secondary | ICD-10-CM

## 2013-09-14 DIAGNOSIS — I252 Old myocardial infarction: Secondary | ICD-10-CM

## 2013-09-14 DIAGNOSIS — J449 Chronic obstructive pulmonary disease, unspecified: Secondary | ICD-10-CM | POA: Diagnosis present

## 2013-09-14 DIAGNOSIS — I5042 Chronic combined systolic (congestive) and diastolic (congestive) heart failure: Secondary | ICD-10-CM | POA: Diagnosis present

## 2013-09-14 DIAGNOSIS — I517 Cardiomegaly: Secondary | ICD-10-CM

## 2013-09-14 DIAGNOSIS — I10 Essential (primary) hypertension: Secondary | ICD-10-CM | POA: Diagnosis present

## 2013-09-14 DIAGNOSIS — F3289 Other specified depressive episodes: Secondary | ICD-10-CM | POA: Diagnosis present

## 2013-09-14 DIAGNOSIS — Z91199 Patient's noncompliance with other medical treatment and regimen due to unspecified reason: Secondary | ICD-10-CM

## 2013-09-14 DIAGNOSIS — I2510 Atherosclerotic heart disease of native coronary artery without angina pectoris: Secondary | ICD-10-CM | POA: Diagnosis present

## 2013-09-14 DIAGNOSIS — F329 Major depressive disorder, single episode, unspecified: Secondary | ICD-10-CM | POA: Diagnosis present

## 2013-09-14 DIAGNOSIS — E785 Hyperlipidemia, unspecified: Secondary | ICD-10-CM | POA: Diagnosis present

## 2013-09-14 DIAGNOSIS — J4489 Other specified chronic obstructive pulmonary disease: Secondary | ICD-10-CM | POA: Diagnosis present

## 2013-09-14 DIAGNOSIS — I639 Cerebral infarction, unspecified: Secondary | ICD-10-CM

## 2013-09-14 DIAGNOSIS — G459 Transient cerebral ischemic attack, unspecified: Secondary | ICD-10-CM

## 2013-09-14 DIAGNOSIS — Q2112 Patent foramen ovale: Secondary | ICD-10-CM

## 2013-09-14 DIAGNOSIS — Z888 Allergy status to other drugs, medicaments and biological substances status: Secondary | ICD-10-CM

## 2013-09-14 DIAGNOSIS — Q211 Atrial septal defect: Secondary | ICD-10-CM

## 2013-09-14 DIAGNOSIS — R29898 Other symptoms and signs involving the musculoskeletal system: Secondary | ICD-10-CM | POA: Diagnosis present

## 2013-09-14 DIAGNOSIS — Z8673 Personal history of transient ischemic attack (TIA), and cerebral infarction without residual deficits: Secondary | ICD-10-CM

## 2013-09-14 DIAGNOSIS — I5022 Chronic systolic (congestive) heart failure: Secondary | ICD-10-CM

## 2013-09-14 DIAGNOSIS — Z9861 Coronary angioplasty status: Secondary | ICD-10-CM

## 2013-09-14 DIAGNOSIS — E669 Obesity, unspecified: Secondary | ICD-10-CM | POA: Diagnosis present

## 2013-09-14 HISTORY — DX: Angina pectoris, unspecified: I20.9

## 2013-09-14 HISTORY — DX: Low back pain: M54.5

## 2013-09-14 HISTORY — DX: Other chronic pain: G89.29

## 2013-09-14 HISTORY — DX: Bursitis of unspecified shoulder: M75.50

## 2013-09-14 HISTORY — DX: Low back pain, unspecified: M54.50

## 2013-09-14 HISTORY — DX: Unspecified chronic bronchitis: J42

## 2013-09-14 LAB — URINALYSIS, ROUTINE W REFLEX MICROSCOPIC
BILIRUBIN URINE: NEGATIVE
Glucose, UA: NEGATIVE mg/dL
Hgb urine dipstick: NEGATIVE
Ketones, ur: NEGATIVE mg/dL
NITRITE: NEGATIVE
PROTEIN: NEGATIVE mg/dL
Specific Gravity, Urine: 1.016 (ref 1.005–1.030)
UROBILINOGEN UA: 0.2 mg/dL (ref 0.0–1.0)
pH: 7 (ref 5.0–8.0)

## 2013-09-14 LAB — RAPID URINE DRUG SCREEN, HOSP PERFORMED
AMPHETAMINES: NOT DETECTED
BARBITURATES: NOT DETECTED
BENZODIAZEPINES: NOT DETECTED
COCAINE: NOT DETECTED
Opiates: NOT DETECTED
TETRAHYDROCANNABINOL: NOT DETECTED

## 2013-09-14 LAB — COMPREHENSIVE METABOLIC PANEL
ALBUMIN: 3.4 g/dL — AB (ref 3.5–5.2)
ALT: 14 U/L (ref 0–35)
AST: 15 U/L (ref 0–37)
Alkaline Phosphatase: 56 U/L (ref 39–117)
BUN: 15 mg/dL (ref 6–23)
CALCIUM: 8.8 mg/dL (ref 8.4–10.5)
CO2: 25 meq/L (ref 19–32)
Chloride: 104 mEq/L (ref 96–112)
Creatinine, Ser: 0.69 mg/dL (ref 0.50–1.10)
GFR calc Af Amer: >90 mL/min (ref >90)
GFR calc non Af Amer: >90 mL/min (ref >90)
Glucose, Bld: 80 mg/dL (ref 70–99)
Potassium: 4.7 mEq/L (ref 3.7–5.3)
SODIUM: 139 meq/L (ref 137–147)
Total Bilirubin: 0.4 mg/dL (ref 0.3–1.2)
Total Protein: 7.9 g/dL (ref 6.0–8.3)

## 2013-09-14 LAB — CBC
HCT: 35.9 % — ABNORMAL LOW (ref 36.0–46.0)
Hemoglobin: 11.4 g/dL — ABNORMAL LOW (ref 12.0–15.0)
MCH: 25.2 pg — ABNORMAL LOW (ref 26.0–34.0)
MCHC: 31.8 g/dL (ref 30.0–36.0)
MCV: 79.4 fL (ref 78.0–100.0)
Platelets: 232 10*3/uL (ref 150–400)
RBC: 4.52 MIL/uL (ref 3.87–5.11)
RDW: 14.9 % (ref 11.5–15.5)
WBC: 8.9 10*3/uL (ref 4.0–10.5)

## 2013-09-14 LAB — PROTIME-INR
INR: 0.91 (ref 0.00–1.49)
Prothrombin Time: 12.1 seconds (ref 11.6–15.2)

## 2013-09-14 LAB — URINE MICROSCOPIC-ADD ON

## 2013-09-14 LAB — TROPONIN I
Troponin I: <0.3 ng/mL (ref <0.30)
Troponin I: <0.3 ng/mL (ref <0.30)

## 2013-09-14 LAB — DIFFERENTIAL
BASOS ABS: 0 10*3/uL (ref 0.0–0.1)
Basophils Relative: 0 % (ref 0–1)
EOS PCT: 1 % (ref 0–5)
Eosinophils Absolute: 0.1 10*3/uL (ref 0.0–0.7)
LYMPHS PCT: 19 % (ref 12–46)
Lymphs Abs: 1.7 10*3/uL (ref 0.7–4.0)
Monocytes Absolute: 0.8 10*3/uL (ref 0.1–1.0)
Monocytes Relative: 9 % (ref 3–12)
NEUTROS ABS: 6.3 10*3/uL (ref 1.7–7.7)
Neutrophils Relative %: 71 % (ref 43–77)

## 2013-09-14 LAB — ETHANOL

## 2013-09-14 LAB — APTT: APTT: 28 s (ref 24–37)

## 2013-09-14 LAB — GLUCOSE, CAPILLARY: Glucose-Capillary: 82 mg/dL (ref 70–99)

## 2013-09-14 LAB — POCT I-STAT TROPONIN I: TROPONIN I, POC: 0.01 ng/mL (ref 0.00–0.08)

## 2013-09-14 MED ORDER — NAPROXEN 500 MG PO TABS
500.0000 mg | ORAL_TABLET | Freq: Two times a day (BID) | ORAL | Status: DC
  Administered 2013-09-14 – 2013-09-15 (×3): 500 mg via ORAL
  Filled 2013-09-14 (×4): qty 1

## 2013-09-14 MED ORDER — ACETAMINOPHEN 500 MG PO TABS
1000.0000 mg | ORAL_TABLET | Freq: Four times a day (QID) | ORAL | Status: DC | PRN
  Administered 2013-09-15: 1000 mg via ORAL
  Filled 2013-09-14: qty 2

## 2013-09-14 MED ORDER — GABAPENTIN 400 MG PO CAPS
400.0000 mg | ORAL_CAPSULE | Freq: Three times a day (TID) | ORAL | Status: DC
  Administered 2013-09-14 – 2013-09-15 (×4): 400 mg via ORAL
  Filled 2013-09-14 (×5): qty 1

## 2013-09-14 MED ORDER — CARVEDILOL 3.125 MG PO TABS
3.1250 mg | ORAL_TABLET | Freq: Two times a day (BID) | ORAL | Status: DC
  Administered 2013-09-14: 3.125 mg via ORAL
  Filled 2013-09-14 (×4): qty 1

## 2013-09-14 MED ORDER — ASPIRIN EC 81 MG PO TBEC
81.0000 mg | DELAYED_RELEASE_TABLET | Freq: Every day | ORAL | Status: DC
  Administered 2013-09-14 – 2013-09-15 (×2): 81 mg via ORAL
  Filled 2013-09-14 (×2): qty 1

## 2013-09-14 MED ORDER — DIPHENOXYLATE-ATROPINE 2.5-0.025 MG PO TABS: 1.0000 | ORAL_TABLET | Freq: Four times a day (QID) | ORAL | Status: DC | PRN

## 2013-09-14 MED ORDER — CLOPIDOGREL BISULFATE 75 MG PO TABS
75.0000 mg | ORAL_TABLET | Freq: Every day | ORAL | Status: DC
  Administered 2013-09-14 – 2013-09-15 (×2): 75 mg via ORAL
  Filled 2013-09-14 (×2): qty 1

## 2013-09-14 MED ORDER — ROSUVASTATIN CALCIUM 5 MG PO TABS
5.0000 mg | ORAL_TABLET | Freq: Every day | ORAL | Status: DC
  Filled 2013-09-14: qty 1

## 2013-09-14 MED ORDER — ADULT MULTIVITAMIN W/MINERALS CH
1.0000 | ORAL_TABLET | Freq: Every day | ORAL | Status: DC
Start: 2013-09-14 — End: 2013-09-15
  Administered 2013-09-14 – 2013-09-15 (×2): 1 via ORAL
  Filled 2013-09-14 (×2): qty 1

## 2013-09-14 MED ORDER — OXYCODONE-ACETAMINOPHEN 5-325 MG PO TABS
2.0000 | ORAL_TABLET | Freq: Three times a day (TID) | ORAL | Status: DC | PRN
  Administered 2013-09-14 – 2013-09-15 (×3): 2 via ORAL
  Filled 2013-09-14 (×4): qty 2

## 2013-09-14 MED ORDER — ZOLPIDEM TARTRATE 5 MG PO TABS: 5.0000 mg | ORAL_TABLET | Freq: Every evening | ORAL | Status: DC | PRN

## 2013-09-14 MED ORDER — ROSUVASTATIN CALCIUM 10 MG PO TABS
10.0000 mg | ORAL_TABLET | Freq: Every day | ORAL | Status: DC
  Administered 2013-09-14: 10 mg via ORAL
  Filled 2013-09-14 (×3): qty 1

## 2013-09-14 MED ORDER — HEPARIN SODIUM (PORCINE) 5000 UNIT/ML IJ SOLN
5000.0000 [IU] | Freq: Three times a day (TID) | INTRAMUSCULAR | Status: DC
  Administered 2013-09-14 – 2013-09-15 (×4): 5000 [IU] via SUBCUTANEOUS
  Filled 2013-09-14 (×6): qty 1

## 2013-09-14 MED ORDER — LUBIPROSTONE 24 MCG PO CAPS
24.0000 ug | ORAL_CAPSULE | Freq: Every day | ORAL | Status: DC
  Administered 2013-09-15: 24 ug via ORAL
  Filled 2013-09-14 (×2): qty 1

## 2013-09-14 MED ORDER — PHENTERMINE HCL 37.5 MG PO CAPS: 37.5000 mg | ORAL_CAPSULE | ORAL | Status: DC

## 2013-09-14 MED ORDER — ALBUTEROL SULFATE HFA 108 (90 BASE) MCG/ACT IN AERS: 2.0000 | INHALATION_SPRAY | Freq: Four times a day (QID) | RESPIRATORY_TRACT | Status: DC | PRN

## 2013-09-14 MED ORDER — NITROGLYCERIN 0.4 MG SL SUBL: 0.4000 mg | SUBLINGUAL_TABLET | SUBLINGUAL | Status: DC | PRN

## 2013-09-14 MED ORDER — ISOSORBIDE MONONITRATE ER 60 MG PO TB24
60.0000 mg | ORAL_TABLET | Freq: Every evening | ORAL | Status: DC
Start: 2013-09-14 — End: 2013-09-15
  Administered 2013-09-14 – 2013-09-15 (×2): 60 mg via ORAL
  Filled 2013-09-14 (×2): qty 1

## 2013-09-14 MED ORDER — TOPIRAMATE 100 MG PO TABS
100.0000 mg | ORAL_TABLET | Freq: Every day | ORAL | Status: DC
  Administered 2013-09-14 – 2013-09-15 (×2): 100 mg via ORAL
  Filled 2013-09-14 (×2): qty 1

## 2013-09-14 MED ORDER — FERROUS SULFATE 325 (65 FE) MG PO TABS
325.0000 mg | ORAL_TABLET | Freq: Three times a day (TID) | ORAL | Status: DC
  Administered 2013-09-14 – 2013-09-15 (×4): 325 mg via ORAL
  Filled 2013-09-14 (×5): qty 1

## 2013-09-14 MED ORDER — ALBUTEROL SULFATE (2.5 MG/3ML) 0.083% IN NEBU: 2.5000 mg | INHALATION_SOLUTION | Freq: Four times a day (QID) | RESPIRATORY_TRACT | Status: DC | PRN

## 2013-09-14 MED ORDER — ELVITEG-COBIC-EMTRICIT-TENOFDF 150-150-200-300 MG PO TABS
1.0000 | ORAL_TABLET | Freq: Every day | ORAL | Status: DC
  Administered 2013-09-15: 1 via ORAL
  Filled 2013-09-14 (×2): qty 1

## 2013-09-14 MED ORDER — PANTOPRAZOLE SODIUM 20 MG PO TBEC
20.0000 mg | DELAYED_RELEASE_TABLET | Freq: Every day | ORAL | Status: DC
  Administered 2013-09-14 – 2013-09-15 (×2): 20 mg via ORAL
  Filled 2013-09-14 (×2): qty 1

## 2013-09-14 MED ORDER — TRAZODONE HCL 50 MG PO TABS
50.0000 mg | ORAL_TABLET | Freq: Every day | ORAL | Status: DC
Start: 2013-09-14 — End: 2013-09-15
  Administered 2013-09-14: 50 mg via ORAL
  Filled 2013-09-14 (×2): qty 1

## 2013-09-14 NOTE — ED Notes (Signed)
Patient states that she had sudden onset left arm numbness, that began around 0745 today, reports previous stroke on 12/18/11.  Reports complete inability to move arm and felt like pins and needles. She states that symptoms have subsided somewhat and she is now able to move her fingers,   Also reports back spasms and stomach cramps yesterday.
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
## 2013-09-14 NOTE — Progress Notes (Signed)
*  PRELIMINARY RESULTS* Vascular Ultrasound Carotid Duplex (Doppler) has been completed.  Preliminary findings: Bilateral:  1-39% ICA stenosis.  Vertebral artery flow is antegrade.      Landry Mellow, RDMS, RVT  09/14/2013, 4:18 PM

## 2013-09-14 NOTE — ED Notes (Addendum)
C/o sudden onset left arm numbness & weakness since 0745 while sitting in her car. No other deficits noted per EMS. Also reported negative stroke screen

## 2013-09-14 NOTE — Progress Notes (Signed)
Pt 53 years old female admitted from Ed with dx TIA  alert,  awake and oriented x4 . Initial assessesment done ,call bell within  [place]. Pt sinus rythym on cardiac monitor .  No c/o at this time  Rn will continue to monitor

## 2013-09-14 NOTE — ED Notes (Signed)
Main lab called to determine status of lab specimen results, notified that tube system was down.  Notified physician, specimens redrawn, specimens sent to minilab and main lab.

## 2013-09-14 NOTE — Code Documentation (Signed)
53yo female arriving to [facility] via [place] at 2.  Patient originally reporting left arm pain that began at 0745 as she was getting into her car.  On arrival, patient complains of left arm weakness and numbness.  Code stroke called in ED.  Patient taken to CT.  NIHSS 4 on arrival, see documentation for details.  Patient continues to complain of decreased sensation on the left side.  Patient's exam is very inconsistent.  At times she will not lift her left arm and leg, other times she is able.  She is also able to complete the ataxia portion of the exam with both her left arm and leg.  See code stroke log for times and documentation.  Dr. Nicole Kindred at bedside.  No acute stroke treatment at this time.  Patient continues to be in the window for treatment with tPA until 1215 should symptoms worsen.

## 2013-09-14 NOTE — ED Notes (Signed)
Internal medicine at bedside

## 2013-09-14 NOTE — ED Notes (Signed)
Patient talking on the phone, laughing and carrying on pleasant conversation

## 2013-09-14 NOTE — Telephone Encounter (Signed)
Called patient no answer lvm lettin her know we cancelled and  need to reschedule her appointment. Waiting on a return call from the patient.
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
## 2013-09-14 NOTE — Progress Notes (Signed)
  Echocardiogram 2D Echocardiogram has been performed.  [place], [place] 09/14/2013, 3:09 PM

## 2013-09-14 NOTE — Consult Note (Signed)
Referring Physician: Dr. Alvino Chapel    Chief Complaint: Weakness and numbness of left arm.  HPI: [name] is an 53 y.o. female history of previous stroke in April 2013, hypertension and hyperlipidemia, presenting with history of acute onset of weakness and numbness involving left upper extremity. She also initially described tightness and tonic flexion of her right upper extremity with shaking. She was last known well at 7:45 AM today. She had no changes in speech and no symptoms involving her face. She also did not notice any changes involving left lower extremity. CT scan of her head showed chronic ischemic changes involving deep white matter on the right and right cerebellum. NIH stroke score was 4. Patient's strength as well as sensory abnormalities. improved in the emergency room. As well, effort was poor for the most part with voluntary movement of left extremities. Patient was not deemed a candidate for TPA. She's been taking aspirin and Plavix and ran out of Plavix about 3 days ago.   LSN:  7:45 AM on 09/14/2013  tPA Given: No: Deficits improving, and variable findings on examination.   MRankin: 1   Past Medical History  Diagnosis Date  . Cholecystitis     Gall bladder removed   . Hypercholesterolemia   . Fibroids   . HIV (human immunodeficiency virus infection) 05/27/11    CD4 460, VL undetectable 10/12/12  . Pelvic pain in female 10/14/11  . PMB (postmenopausal bleeding)     Since 2010  . Increased BMI 05/27/11  . Anxiety   . Depression   . Hypertension   . H/O varicella   . History of measles, mumps, or rubella   . Blood transfusion   . Hypothyroidism     Reports h/o hypothyroidism; not on any meds, TSH wnl over several years  . 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 Myocardial infarction 07/2004  . COPD (chronic obstructive pulmonary disease)     well controlled, only using albuterol inhaler  occasionally   . Pneumonia 1980's    "once"  . Iron deficiency anemia   . Lower GI bleed 04/24/2012    from hemorrhoids. No recent colonoscopy   . 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 Arthritis     "knees"  . CAD in native artery     s/p stent and restenting of LAD. On plavix    Family History  Problem Relation Age of Onset  . Hypertension Mother   . Hyperlipidemia Mother   . Obesity Mother   . Heart disease Mother   . Hypertension Maternal Aunt   . Hyperlipidemia Maternal Aunt   . Obesity Maternal Aunt   . Stroke Maternal Aunt      Medications: I have reviewed the patient's current medications.  ROS: History obtained from the patient  General ROS: negative for - chills, fatigue, fever, night sweats, weight gain or weight loss Psychological ROS: negative for - behavioral disorder, hallucinations, memory difficulties, mood swings or suicidal ideation Ophthalmic ROS: negative for - blurry vision, double vision, eye pain or loss of vision ENT ROS: negative for - epistaxis, nasal discharge, oral lesions, sore throat, tinnitus or vertigo Allergy and Immunology ROS: negative for - hives or itchy/watery eyes Hematological and Lymphatic ROS: negative for - bleeding problems, bruising or swollen lymph nodes Endocrine ROS: negative  for - galactorrhea, hair pattern changes, polydipsia/polyuria or temperature intolerance Respiratory ROS: negative for - cough, hemoptysis, shortness of breath or wheezing Cardiovascular ROS: negative for - chest pain, dyspnea on exertion, edema or irregular heartbeat Gastrointestinal ROS: negative for - abdominal pain, diarrhea, hematemesis, nausea/vomiting or stool incontinence Genito-Urinary ROS: negative for - dysuria, hematuria, incontinence or urinary frequency/urgency Musculoskeletal ROS: negative for - joint swelling or muscular weakness Neurological ROS: as  noted in HPI Dermatological ROS: negative for rash and skin lesion changes  Physical Examination: Blood pressure 128/76, pulse 81, temperature 97.7 F (36.5 C), temperature source Oral, resp. rate 14, SpO2 100.00%.  Neurologic Examination: Mental Status: Alert, oriented, tearful and anxious.  Speech fluent without evidence of aphasia. Able to follow commands without difficulty. Cranial Nerves: II-Visual fields were normal. III/IV/VI-Pupils were equal and reacted. Extraocular movements were full and conjugate.    V/VII-no facial numbness and no facial weakness. VIII-normal. X-normal speech. Motor: Drift of upper and lower extremities on the left with poor effort with taking extremities against gravity, as well as poor effort with manual testing of left extremities strength; normal strength of right extremities; normal muscle tone throughout. Sensory: Reduced perception of tactile sensation over left extremities compared to right extremities. Deep Tendon Reflexes: 1+ and symmetric. Plantars: Flexor bilaterally Cerebellar: Normal finger-to-nose testing.  Ct Head Wo Contrast  09/14/2013   CLINICAL DATA:  Code stroke.  Numbness and weakness left arm  EXAM: CT HEAD WITHOUT CONTRAST  TECHNIQUE: Contiguous axial images were obtained from the base of the skull through the vertex without intravenous contrast.  COMPARISON:  CT 09/14/2011  FINDINGS: Chronic infarct right cerebellum. Hypodensity in the deep white matter on the right is unchanged from the MRI of 06/13/2012 and most consistent with chronic microvascular ischemia.  Negative for acute infarct.  Negative for hemorrhage or mass.  IMPRESSION: Chronic ischemic changes are stable.  No acute abnormality.  Critical Value/emergent results were called by telephone at the time of interpretation on 09/14/2013 at 9:07 AM to Dr. Wallie Char, who verbally acknowledged these results.   Electronically Signed   By: Franchot Gallo M.D.   On: 09/14/2013 09:08     Assessment: 53 y.o. female  with multiple risk factors for stroke as well as previous history of cerebral infarction, presenting with numbness and weakness of left upper extremity. TIA or possible recurrent subcortical right cerebral infarction cannot be ruled out. New-onset right focal seizures cannot be ruled out as well.  Stroke Risk Factors - family history, hyperlipidemia and hypertension  Plan: 1. HgbA1c, fasting lipid panel 2. MRI, MRA  of the brain without contrast 3. PT consult, OT consult, Speech consult 4. Echocardiogram 5. Carotid dopplers 6. Prophylactic therapy-aspirin and Plavix 7.  EEG, routine adult study 8. Telemetry monitoring   C.R. Nicole Kindred, MD [facility]  09/14/2013, 9:21 AM

## 2013-09-15 ENCOUNTER — Inpatient Hospital Stay (HOSPITAL_COMMUNITY): Payer: Medicare HMO

## 2013-09-15 DIAGNOSIS — E785 Hyperlipidemia, unspecified: Secondary | ICD-10-CM

## 2013-09-15 DIAGNOSIS — I10 Essential (primary) hypertension: Secondary | ICD-10-CM

## 2013-09-15 DIAGNOSIS — G459 Transient cerebral ischemic attack, unspecified: Secondary | ICD-10-CM

## 2013-09-15 DIAGNOSIS — I6350 Cerebral infarction due to unspecified occlusion or stenosis of unspecified cerebral artery: Principal | ICD-10-CM

## 2013-09-15 DIAGNOSIS — I2510 Atherosclerotic heart disease of native coronary artery without angina pectoris: Secondary | ICD-10-CM

## 2013-09-15 DIAGNOSIS — Z21 Asymptomatic human immunodeficiency virus [HIV] infection status: Secondary | ICD-10-CM

## 2013-09-15 DIAGNOSIS — I5022 Chronic systolic (congestive) heart failure: Secondary | ICD-10-CM

## 2013-09-15 DIAGNOSIS — I252 Old myocardial infarction: Secondary | ICD-10-CM

## 2013-09-15 DIAGNOSIS — I509 Heart failure, unspecified: Secondary | ICD-10-CM

## 2013-09-15 LAB — LIPID PANEL
Cholesterol: 199 mg/dL (ref 0–200)
HDL: 63 mg/dL (ref >39)
LDL CALC: 121 mg/dL — AB (ref 0–99)
Total CHOL/HDL Ratio: 3.2 RATIO
Triglycerides: 75 mg/dL (ref <150)
VLDL: 15 mg/dL (ref 0–40)

## 2013-09-15 LAB — HEMOGLOBIN A1C
Hgb A1c MFr Bld: 5.9 % — ABNORMAL HIGH (ref <5.7)
MEAN PLASMA GLUCOSE: 123 mg/dL — AB (ref <117)

## 2013-09-15 MED ORDER — ROSUVASTATIN CALCIUM 40 MG PO TABS
40.0000 mg | ORAL_TABLET | Freq: Every day | ORAL | Status: DC
  Administered 2013-09-15: 40 mg via ORAL
  Filled 2013-09-15: qty 1

## 2013-09-15 MED ORDER — WHITE PETROLATUM GEL
Status: AC
  Filled 2013-09-15: qty 5

## 2013-09-15 MED ORDER — ROSUVASTATIN CALCIUM 40 MG PO TABS: 40.0000 mg | ORAL_TABLET | Freq: Every day | ORAL | Status: DC

## 2013-09-15 NOTE — Progress Notes (Signed)
Stroke Team Progress Note  HISTORY [name] is a 53 y.o. female history of previous stroke in April 2013, hypertension and hyperlipidemia, presenting with history of acute onset of weakness and numbness involving left upper extremity. She also initially described tightness and tonic flexion of her right upper extremity with shaking. She was last known well at 7:45 AM on 09/14/2048. She had no changes in speech and no symptoms involving her face. She also did not notice any changes involving left lower extremity. CT scan of her head showed chronic ischemic changes involving deep white matter on the right and right cerebellum. NIH stroke score was 4. Patient's strength as well as sensory abnormalities improved in the emergency room. As well, effort was poor for the most part with voluntary movement of left extremities. Patient was not deemed a candidate for TPA. She's been taking aspirin and Plavix and ran out of Plavix about 3 days prior to admission.   LSN: 7:45 AM on 09/14/2013  tPA Given: No: Deficits improving, and variable findings on examination.  MRankin: 1    SUBJECTIVE [name] husband is at the bedside. Dr. Leonie Man discussed the need for a loop recorder. He spoke with cardiology. They will evaluate the patient prior to discharge and plan the loop placement on an outpatient basis. The patient was to have eye surgery in the near future and her ophthalmologist recommended holding Plavix for one week prior to surgery. Dr. Leonie Man felt that the patient should not have surgery for least 3 months since he does not want her coming off the Plavix until then. The patient had a TEE in April of 2013. It was not felt that this needed to be repeated at this time. The patient feels improved but does not feel she is at baseline.  OBJECTIVE Most recent Vital Signs: Filed Vitals:   09/14/13 1820 09/14/13 2300 09/15/13 0550 09/15/13 0942  BP: 130/87 102/46 99/54 102/57  Pulse: 100 88 76 106   Temp: 97.8 F (36.6 C) 98 F (36.7 C) 97.7 F (36.5 C) 97.9 F (36.6 C)  TempSrc: Oral Oral Oral Oral  Resp: 20 20 22 20   Height:      Weight:      SpO2: 92% 99% 100% 99%   CBG (last 3)   Recent Labs  09/14/13 0932  GLUCAP 82    IV Fluid Intake:     MEDICATIONS  . aspirin EC  81 mg Oral Daily  . carvedilol  3.125 mg Oral BID WC  . clopidogrel  75 mg Oral Daily  . elvitegravir-cobicistat-emtricitabine-tenofovir  1 tablet Oral Q breakfast  . ferrous sulfate  325 mg Oral TID WC  . gabapentin  400 mg Oral TID  . heparin  5,000 Units Subcutaneous Q8H  . isosorbide mononitrate  60 mg Oral QPM  . lubiprostone  24 mcg Oral Q breakfast  . multivitamin with minerals  1 tablet Oral Daily  . naproxen  500 mg Oral BID  . pantoprazole  20 mg Oral Daily  . rosuvastatin  40 mg Oral q1800  . topiramate  100 mg Oral Daily  . traZODone  50 mg Oral QHS   PRN:  acetaminophen, albuterol, diphenoxylate-atropine, nitroGLYCERIN, oxyCODONE-acetaminophen, zolpidem  Diet:  Cardiac thin liquids Activity:  Up with assistance DVT Prophylaxis:  Subcutaneous heparin  CLINICALLY SIGNIFICANT STUDIES Basic Metabolic Panel:  Recent Labs Lab 09/14/13 1040  NA 139  K 4.7  CL 104  CO2 25  GLUCOSE 80  BUN 15  CREATININE  0.69  CALCIUM 8.8   Liver Function Tests:  Recent Labs Lab 09/14/13 1040  AST 15  ALT 14  ALKPHOS 56  BILITOT 0.4  PROT 7.9  ALBUMIN 3.4*   CBC:  Recent Labs Lab 09/14/13 1040  WBC 8.9  NEUTROABS 6.3  HGB 11.4*  HCT 35.9*  MCV 79.4  PLT 232   Coagulation:  Recent Labs Lab 09/14/13 1040  LABPROT 12.1  INR 0.91   Cardiac Enzymes:  Recent Labs Lab 09/14/13 1040 09/14/13 1415  TROPONINI <0.30 <0.30   Urinalysis:  Recent Labs Lab 09/14/13 1002  COLORURINE YELLOW  LABSPEC 1.016  PHURINE 7.0  GLUCOSEU NEGATIVE  HGBUR NEGATIVE  BILIRUBINUR NEGATIVE  KETONESUR NEGATIVE  PROTEINUR NEGATIVE  UROBILINOGEN 0.2  NITRITE NEGATIVE  LEUKOCYTESUR  TRACE*   Lipid Panel    Component Value Date/Time   CHOL 199 09/15/2013 0537   TRIG 75 09/15/2013 0537   HDL 63 09/15/2013 0537   CHOLHDL 3.2 09/15/2013 0537   VLDL 15 09/15/2013 0537   LDLCALC 121* 09/15/2013 0537   HgbA1C  Lab Results  Component Value Date   HGBA1C 6.2* 06/13/2012    Urine Drug Screen:     Component Value Date/Time   LABOPIA NONE DETECTED 09/14/2013 1002   LABOPIA NEG 06/22/2013 1615   COCAINSCRNUR NONE DETECTED 09/14/2013 1002   COCAINSCRNUR NEG 06/22/2013 1615   LABBENZ NONE DETECTED 09/14/2013 1002   LABBENZ NEG 06/22/2013 1615   LABBENZ NEGATIVE 05/23/2009 0542   AMPHETMU NONE DETECTED 09/14/2013 1002   AMPHETMU NEGATIVE 05/23/2009 0542   THCU NONE DETECTED 09/14/2013 1002   LABBARB NONE DETECTED 09/14/2013 1002   LABBARB NEG 06/22/2013 1615    Alcohol Level:  Recent Labs Lab 09/14/13 1040  ETH <11    Ct Head Wo Contrast 09/14/2013     Chronic ischemic changes are stable.  No acute abnormality.    Mri / MRA Brain Wo Contrast 09/14/2013    1. Two punctate foci of acute infarct in the right frontal and right parietal lobes. 2. No evidence of major intracranial arterial occlusion or flow-limiting stenosis.    2D Echocardiogram  ejection fraction 25%. No definite cardiac source of emboli identified.  Carotid Doppler  Pending  CXR    EKG sinus rhythm rate 97 beats per minute.  Therapy Recommendations home health physical therapy with intermittent supervision recommended.  Physical Exam   Pleasant middle aged lady not in distress.Awake alert. Afebrile. Head is nontraumatic. Neck is supple without bruit. Hearing is normal. Cardiac exam no murmur or gallop. Lungs are clear to auscultation. Distal pulses are well felt. Neurologic Examination:   Mental Status:  Alert, oriented, tearful and anxious. Speech fluent without evidence of aphasia. Able to follow commands without difficulty.  Cranial Nerves:  II-Visual fields were normal.  III/IV/VI-Pupils were equal and  reacted. Extraocular movements were full and conjugate.  V/VII-no facial numbness and no facial weakness.  VIII-normal.  X-normal speech.  Motor: Drift of upper and lower extremities on the left with poor effort with taking extremities against gravity, as well as poor effort with manual testing of left extremities strength; normal strength of right extremities; normal muscle tone throughout.  Sensory: Reduced perception of tactile sensation over left extremities compared to right extremities.  Deep Tendon Reflexes: 1+ and symmetric.  Plantars: Flexor bilaterally  Cerebellar: Normal finger-to-nose testing.   ASSESSMENT Ms. Tanika Obradovich is a 53 y.o. female presenting with left upper extremity weakness and numbness and flexion of the right arm.  TPA was not given as the patient's symptoms appeared to be improving. An MRI revealed two punctate foci of acute infarct in the right frontal and right parietal lobes. Infarct felt to be embolic of uncertain etiology. On aspirin 81 mg orally every day and clopidogrel 75 mg orally every day prior to admission. Now on aspirin 81 mg orally every day and clopidogrel 75 mg orally every day for secondary stroke prevention. Patient with resultant left hemiparesis. Work up underway.   Ejection fraction 25% by 2-D echo  Hypertension history  Previous stroke in April of 2013. EF 25 %. TEE normal and cardiac event monitor no afib  Hyperlipidemia - cholesterol 199 LDL 121 - on Crestor.  HIV  Coronary artery disease  Aspirin and Plavix prior to admission although the patient had run out of her Plavix for a week prior to admission.  Hospital day # 1  TREATMENT/PLAN  Continue aspirin 81 mg orally every day and clopidogrel 75 mg orally every day for secondary stroke prevention.  Carotid Doppler and EEG pending  Therapist recommended home health physical therapy with intermittent supervision.  Cardiology to evaluate the patient for loop recorder  placement which could be done on an outpatient basis.  Could probably discharge patient later today.  Followup Dr. Leonie Man in 2 months.  Brooke Bussing PA-C [facility] Pager [phone] 09/15/2013, 11:02 AM  I have personally obtained a history, examined the patient, evaluated imaging results, and formulated the assessment and plan of care. I agree with the above. Antony Contras, MD

## 2013-09-15 NOTE — Progress Notes (Signed)
EEG completed; results pending.    

## 2013-09-15 NOTE — H&P (Signed)
  Date: 09/15/2013  Patient name: [name]  Medical record number: [id number]  Date of birth: 1961/02/22   I have seen and evaluated [name] and discussed their care with the Residency Team.  Briefly, [name] is a 53yo woman with PMH of stroke in April of 2013, HTN, HLD, HIV (well controlled), CAD, CHF (EF of 25%) who presented with left arm weakness.  The symptoms started suddenly on the day of admission while she was in her car and consisted of her left arm feeling weakn and numb.  She had no other weakness, denies dysarthria or dysphasia.  She further had tonic flexion of the right arm which resolved spontaneously and has not recurred.  CT head in the ED revealed no acute changes but chronic ischemic changes. Neurology was consulted.  Of note, she had been out of her Plavix for about a week (supposed to be on asa and plavix) due to issues with her mail in pharmacy. She is a former smoker.    Assessment and Plan: I have seen and evaluated the patient as outlined above. I agree with the formulated Assessment and Plan as detailed in the residents' admission note, with the following changes:   1. CVA vs. TIA: Neurology consulted, MRI/MRA brain, TTE, CD and risk stratification.  Given flexion of arm, EEG will be planned.  PT/OT/SLP as needed.  ASA and restart plavix.  She has previously been on crestor only at 5mg , would increase to full dose of 40mg  if she could tolerate.  Hold BP meds for permissive HTN.    Other issues are discussed in resident note.   Sid Falcon, MD 1/10/201511:11 AM

## 2013-09-15 NOTE — Evaluation (Signed)
Physical Therapy Evaluation Patient Details Name: [name] MRN: [id number] DOB: Jan 29, 1961 Today's Date: 09/15/2013 Time: 0814-4818 PT Time Calculation (min): 27 min  PT Assessment / Plan / Recommendation History of Present Illness  Patient is 53 yo female s/p Two punctate foci of acute infarct in the right frontal and right.  Clinical Impression  Patient demonstrates deficits in functional mobility as indicated below. Will benefit from continued skilled PT to address deficits and maximize function. Will see as indicated recommend HHPT upon discharge.    PT Assessment  Patient needs continued PT services    Follow Up Recommendations  Home health PT;Supervision - Intermittent          Equipment Recommendations  None recommended by PT       Frequency Min 3X/week    Precautions / Restrictions Precautions Precautions: Fall Restrictions Weight Bearing Restrictions: No   Pertinent Vitals/Pain 5/10      Mobility  Bed Mobility Overal bed mobility: Modified Independent Transfers Overall transfer level: Modified independent Equipment used: Rolling walker (2 wheeled) General transfer comment: Increased time to perform, 2/2 pain Ambulation/Gait Ambulation/Gait assistance: Supervision;Min guard Ambulation Distance (Feet): 190 Feet Assistive device: Rolling walker (2 wheeled) Gait Pattern/deviations: Step-to pattern;Decreased weight shift to right;Antalgic;Trunk flexed Gait velocity: decreased Gait velocity interpretation: Below normal speed for age/gender General Gait Details: some noted deficits with gait, VCs for positioning within RW and upright posture Modified Rankin (Stroke Patients Only) Pre-Morbid Rankin Score: Moderate disability Modified Rankin: Moderately severe disability    Exercises     PT Diagnosis: Difficulty walking;Abnormality of gait;Generalized weakness;Acute pain  PT Problem List: Decreased strength;Decreased activity tolerance;Decreased  balance;Decreased mobility;Decreased knowledge of use of DME;Pain PT Treatment Interventions: DME instruction;Gait training;Functional mobility training;Therapeutic activities;Therapeutic exercise;Patient/family education     PT Goals(Current goals can be found in the care plan section) Acute Rehab PT Goals Patient Stated Goal: to go home PT Goal Formulation: With patient Time For Goal Achievement: 09/29/13 Potential to Achieve Goals: Fair  Visit Information  Last PT Received On: 09/15/13 Assistance Needed: +1 History of Present Illness: Patient is 54 yo female s/p Two punctate foci of acute infarct in the right frontal and right.       Prior [place] expects to be discharged to:: Private residence Living Arrangements: Spouse/significant other;Children Available Help at Discharge: Family Type of Home: House Home Access: Stairs to enter Technical brewer of Steps: 3 Entrance Stairs-Rails: [place]: One [place]: Environmental consultant - 2 wheels;Walker - 4 wheels;Bedside commode Prior Function Level of Independence: Independent;Independent with assistive device(s) Comments: uses rollator at times Dominant Hand: Right    Cognition  Cognition Arousal/Alertness: Awake/alert Behavior During Therapy: WFL for tasks assessed/performed Overall Cognitive Status: Within Functional Limits for tasks assessed    Extremity/Trunk Assessment Upper Extremity Assessment Upper Extremity Assessment: Defer to OT evaluation Lower Extremity Assessment Lower Extremity Assessment: Generalized weakness;RLE deficits/detail;LLE deficits/detail RLE Deficits / Details: arthiritic changes in ankle limiting mobility at baseline (has been wearing cam boot pt reports) LLE Deficits / Details: noted functional weakness and LE lag with ambulation LLE Sensation: decreased light touch   Balance Balance Overall balance assessment: Needs assistance;History of  Falls Standing balance support: Bilateral upper extremity supported;During functional activity Standing balance comment: min guard  End of Session PT - End of Session Equipment Utilized During Treatment: Gait belt;Other (comment) (post op shoe) Activity Tolerance: Patient limited by pain Patient left: in bed;with call bell/phone within reach (bed alarm unable to be set (nsg  aware)) Nurse Communication: Mobility status  GP     Duncan Dull 09/15/2013, 9:43 AM Alben Deeds, PT DPT  [phone]

## 2013-09-15 NOTE — Progress Notes (Signed)
Subjective: [name] is doing well this morning, no complaints.  She still feels that her arm is somewhat weak and numb but improved since yesterday.  She is now able to fully lift her arm.  No lower extremity issues.   Objective: Vital signs in last 24 hours: Filed Vitals:   09/14/13 1511 09/14/13 1820 09/14/13 2300 09/15/13 0550  BP: 121/82 130/87 102/46 99/54  Pulse: 93 100 88 76  Temp: 98.5 F (36.9 C) 97.8 F (36.6 C) 98 F (36.7 C) 97.7 F (36.5 C)  TempSrc: Oral Oral Oral Oral  Resp: 18 20 20 22   Height:      Weight:      SpO2: 99% 92% 99% 100%   Weight change:   Intake/Output Summary (Last 24 hours) at 09/15/13 0930 Last data filed at 09/14/13 1858  Gross per 24 hour  Intake    480 ml  Output    250 ml  Net    230 ml   PEX General: alert, cooperative, NAD HEENT: NCAT, sclerae anicteric  Neck: supple, no lymphadenopathy Lungs: clear to ascultation bilaterally, normal work of respiration, no wheezes, rales, ronchi Heart: regular rate and rhythm, no murmurs, gallops, or rubs Abdomen: soft, non-tender, non-distended, normal bowel sounds Extremities: 2+ DP/PT pulses bilaterally, no cyanosis, clubbing, or edema Neurologic: alert & oriented X3, cranial nerves II-XII intact (no facial droop), strength grossly intact- 4/5 in left arm, sensation slightly decreased in left arm otherwise strength and sensation intact throughout  Lab Results: Basic Metabolic Panel:  Recent Labs Lab 09/14/13 1040  NA 139  K 4.7  CL 104  CO2 25  GLUCOSE 80  BUN 15  CREATININE 0.69  CALCIUM 8.8   Liver Function Tests:  Recent Labs Lab 09/14/13 1040  AST 15  ALT 14  ALKPHOS 56  BILITOT 0.4  PROT 7.9  ALBUMIN 3.4*   CBC:  Recent Labs Lab 09/14/13 1040  WBC 8.9  NEUTROABS 6.3  HGB 11.4*  HCT 35.9*  MCV 79.4  PLT 232   Cardiac Enzymes:  Recent Labs Lab 09/14/13 1040 09/14/13 1415  TROPONINI <0.30 <0.30   CBG:  Recent Labs Lab 09/14/13 0932  GLUCAP  82   Fasting Lipid Panel:  Recent Labs Lab 09/15/13 0537  CHOL 199  HDL 63  LDLCALC 121*  TRIG 75  CHOLHDL 3.2   Coagulation:  Recent Labs Lab 09/14/13 1040  LABPROT 12.1  INR 0.91   Urine Drug Screen: Drugs of Abuse     Component Value Date/Time   LABOPIA NONE DETECTED 09/14/2013 1002   LABOPIA NEG 06/22/2013 1615   COCAINSCRNUR NONE DETECTED 09/14/2013 1002   COCAINSCRNUR NEG 06/22/2013 1615   LABBENZ NONE DETECTED 09/14/2013 1002   LABBENZ NEG 06/22/2013 1615   LABBENZ NEGATIVE 05/23/2009 0542   AMPHETMU NONE DETECTED 09/14/2013 1002   AMPHETMU NEGATIVE 05/23/2009 0542   THCU NONE DETECTED 09/14/2013 1002   LABBARB NONE DETECTED 09/14/2013 1002   LABBARB NEG 06/22/2013 1615    Alcohol Level:  Recent Labs Lab 09/14/13 1040  ETH <11   Urinalysis:  Recent Labs Lab 09/14/13 1002  COLORURINE YELLOW  LABSPEC 1.016  PHURINE 7.0  GLUCOSEU NEGATIVE  HGBUR NEGATIVE  BILIRUBINUR NEGATIVE  KETONESUR NEGATIVE  PROTEINUR NEGATIVE  UROBILINOGEN 0.2  NITRITE NEGATIVE  LEUKOCYTESUR TRACE*    Studies/Results: Ct Head Wo Contrast  09/14/2013   CLINICAL DATA:  Code stroke.  Numbness and weakness left arm  EXAM: CT HEAD WITHOUT CONTRAST  TECHNIQUE: Contiguous axial  images were obtained from the base of the skull through the vertex without intravenous contrast.  COMPARISON:  CT 09/14/2011  FINDINGS: Chronic infarct right cerebellum. Hypodensity in the deep white matter on the right is unchanged from the MRI of 06/13/2012 and most consistent with chronic microvascular ischemia.  Negative for acute infarct.  Negative for hemorrhage or mass.  IMPRESSION: Chronic ischemic changes are stable.  No acute abnormality.  Critical Value/emergent results were called by telephone at the time of interpretation on 09/14/2013 at 9:07 AM to Dr. Wallie Char, who verbally acknowledged these results.   Electronically Signed   By: Franchot Gallo M.D.   On: 09/14/2013 09:08   Mr Brain Wo  Contrast  09/14/2013   CLINICAL DATA:  Stroke. History of stroke in 2013. This morning began feeling weak and numb in the left arm. Mild word-finding difficulty.  EXAM: MRI HEAD WITHOUT CONTRAST  MRA HEAD WITHOUT CONTRAST  TECHNIQUE: Multiplanar, multiecho pulse sequences of the brain and surrounding structures were obtained without intravenous contrast. Angiographic images of the head were obtained using MRA technique without contrast.  COMPARISON:  Head CT 09/14/2013.  Brain MRI and MRA 06/13/2012.  FINDINGS: MRI HEAD FINDINGS  Images are mildly degraded by motion artifact. There are 2 punctate foci of cortical restricted diffusion in the right frontal and right parietal lobes. There is no intracranial hemorrhage, mass, midline shift, or extra-axial fluid collection. Old infarcts are noted in the right centrum semiovale/ corona radiata and right cerebellum, unchanged. Ventricles and sulci are within normal limits. Major intracranial vascular flow voids are unremarkable. Prior right scleral buckle is noted. Paranasal sinuses and mastoid air cells are clear.  MRA HEAD FINDINGS  Visualized distal vertebral arteries are patent and codominant. PICA origins are patent bilaterally. Basilar artery is patent without stenosis. Right SCA origin is patent. Left SCA is not identified. PCA origins and visualized branches are patent and unremarkable. Bilateral posterior communicating arteries are present. Internal carotid arteries are patent from skullbase to carotid terminus. ACA and MCA origins and visualized branches are unremarkable. No intracranial aneurysm is identified.  IMPRESSION: 1. Two punctate foci of acute infarct in the right frontal and right parietal lobes. 2. No evidence of major intracranial arterial occlusion or flow-limiting stenosis.   Electronically Signed   By: Logan Bores   On: 09/14/2013 17:58   Mr [name] Head/brain Wo Cm  09/14/2013   CLINICAL DATA:  Stroke. History of stroke in 2013. This morning began  feeling weak and numb in the left arm. Mild word-finding difficulty.  EXAM: MRI HEAD WITHOUT CONTRAST  MRA HEAD WITHOUT CONTRAST  TECHNIQUE: Multiplanar, multiecho pulse sequences of the brain and surrounding structures were obtained without intravenous contrast. Angiographic images of the head were obtained using MRA technique without contrast.  COMPARISON:  Head CT 09/14/2013.  Brain MRI and MRA 06/13/2012.  FINDINGS: MRI HEAD FINDINGS  Images are mildly degraded by motion artifact. There are 2 punctate foci of cortical restricted diffusion in the right frontal and right parietal lobes. There is no intracranial hemorrhage, mass, midline shift, or extra-axial fluid collection. Old infarcts are noted in the right centrum semiovale/ corona radiata and right cerebellum, unchanged. Ventricles and sulci are within normal limits. Major intracranial vascular flow voids are unremarkable. Prior right scleral buckle is noted. Paranasal sinuses and mastoid air cells are clear.  MRA HEAD FINDINGS  Visualized distal vertebral arteries are patent and codominant. PICA origins are patent bilaterally. Basilar artery is patent without stenosis. Right SCA origin  is patent. Left SCA is not identified. PCA origins and visualized branches are patent and unremarkable. Bilateral posterior communicating arteries are present. Internal carotid arteries are patent from skullbase to carotid terminus. ACA and MCA origins and visualized branches are unremarkable. No intracranial aneurysm is identified.  IMPRESSION: 1. Two punctate foci of acute infarct in the right frontal and right parietal lobes. 2. No evidence of major intracranial arterial occlusion or flow-limiting stenosis.   Electronically Signed   By: Logan Bores   On: 09/14/2013 17:58   Medications: I have reviewed the patient's current medications. Scheduled Meds: . aspirin EC  81 mg Oral Daily  . carvedilol  3.125 mg Oral BID WC  . clopidogrel  75 mg Oral Daily  .  elvitegravir-cobicistat-emtricitabine-tenofovir  1 tablet Oral Q breakfast  . ferrous sulfate  325 mg Oral TID WC  . gabapentin  400 mg Oral TID  . heparin  5,000 Units Subcutaneous Q8H  . isosorbide mononitrate  60 mg Oral QPM  . lubiprostone  24 mcg Oral Q breakfast  . multivitamin with minerals  1 tablet Oral Daily  . naproxen  500 mg Oral BID  . pantoprazole  20 mg Oral Daily  . rosuvastatin  40 mg Oral q1800  . topiramate  100 mg Oral Daily  . traZODone  50 mg Oral QHS   Continuous Infusions:  PRN Meds:.acetaminophen, albuterol, diphenoxylate-atropine, nitroGLYCERIN, oxyCODONE-acetaminophen, zolpidem Assessment/Plan: #Stroke, acute punctate infarcts in right frontal and right parietal lobes on MRI- Patient presented with left arm weakness and numbness, improved now but not resolved.  EEG negative. Troponin x 3 negative.  BUE dopplers negative.  Echo with contrast showed EF 25% with anteroseptal, inferoseptal, and anterior wall akinesis, grade 1 diastolic dysfunction. Risk stratification: A1C 5.9%, LDL 121, TSH pending. -neurology consult, appreciate recs -continue ASA, Plavix per neuro recs; patient did not fail this regimen since she had not been taking Plavix for past week -cardiology to see patient for possible loop recorder placement -PT and OT recommending home health PT  #HTN- BP low-normotensive, permissive HTN through tomorrow -holding home HCTZ and lisinopril until 1/11; continue Coreg given cardiac history   #HL- LDL 144 in 06/2013 --> 121.  On Crestor 5 mg daily at home.  Allergy listed to Lipitor -increase Crestor to 40 mg daily  #Chronic systolic heart failure- Echo results per above.  Patient does not appear volume overloaded on exam.  Weight decreased from prior. -continue Coreg 3.125 mg BID, ASA -holding lisinopril   #CAD- MI s/p stent placement x 2.  No chest pain, EKG unchanged, troponin negative. -continue ASA, Imdur  #HIV- well controlled -continue home  meds  Dispo: Disposition is deferred at this time, awaiting improvement of current medical problems.  Anticipated discharge today or tomorrow.    The patient does have a current PCP Joni Reining, DO) and does need an [facility] hospital follow-up appointment after discharge.   .Services Needed at time of discharge: Y = Yes, Blank = No PT:   OT:   RN:   Equipment:   Other:     LOS: 1 day   Ivin Poot, MD 09/15/2013, 9:30 AM
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
## 2013-09-15 NOTE — Progress Notes (Signed)
Pt refused to have foley d/c'd at this time; pt sts "I know I can't get up, not yet". Pt is requesting to speak to doctor prior to d/c-ing foley catheter and ambulating.
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
## 2013-09-15 NOTE — Discharge Summary (Signed)
Name: [name] MRN: [id number] DOB: 10-26-1960 53 y.o. PCP: Carlynn Purl, DO  Date of Admission: 09/14/2013  8:31 AM Date of Discharge: 09/15/2013 Attending Physician: Dr. 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     albuterol 108 (90 BASE) MCG/ACT inhaler  Commonly known as:  PROVENTIL HFA;VENTOLIN HFA  Inhale 2 puffs into the lungs every 6 (six) hours as needed. For wheezing and shortness of breath     aspirin 81 MG EC tablet  Take 1 tablet (81 mg total) by mouth daily. Swallow whole.     bacitracin ointment  Apply 1 application topically 2 (two) times daily. To affected area x 7 days     carvedilol 3.125 MG tablet  Commonly known as:  COREG  Take 1 tablet (3.125 mg total) by mouth 2 (two) times daily with a meal. 9am and 6pm     clopidogrel 75 MG tablet  Commonly known as:  PLAVIX  Take 75 mg by mouth daily.     diphenoxylate-atropine 2.5-0.025 MG per tablet  Commonly known as:  LOMOTIL  Take 1 tablet by mouth 4 (four) times daily as needed. For loose stool     ferrous sulfate 325 (65 FE) MG tablet  Take 1 tablet (325 mg total) by mouth 3 (three) times daily with meals.     gabapentin 400 MG capsule  Commonly known as:  NEURONTIN  Take 1 capsule (400 mg total) by mouth 3 (three) times daily.     hydrochlorothiazide 25 MG tablet  Commonly known as:  HYDRODIURIL  Take 1 tablet (25 mg total) by mouth daily.     isosorbide mononitrate 60 MG 24 hr tablet  Commonly known as:  IMDUR  Take 1 tablet (60 mg total)  by mouth every evening. 5pm     lisinopril 5 MG tablet  Commonly known as:  PRINIVIL,ZESTRIL  Take 1 tablet (5 mg total) by mouth daily.     lubiprostone 24 MCG capsule  Commonly known as:  AMITIZA  Take 1 capsule (24 mcg total) by mouth daily with breakfast.     multivitamin with minerals Tabs tablet  Take 1 tablet by mouth daily.     naproxen 500 MG tablet  Commonly known as:  NAPROSYN  Take 1 tablet (500 mg total) by mouth 2 (two) times daily.     nitroGLYCERIN 0.3 MG SL tablet  Commonly known as:  NITROSTAT  Place 0.3 mg under the tongue every 5 (five) minutes as needed. For chest pain     pantoprazole 20 MG tablet  Commonly known as:  PROTONIX  Take 20 mg by mouth daily.     phentermine 37.5 MG capsule  Take 1 capsule (37.5 mg total) by mouth every morning.     rosuvastatin 40 MG tablet  Commonly known as:  CRESTOR  Take 1 tablet (40 mg total) by mouth daily at 6 PM.     STRIBILD 150-150-200-300 MG Tabs tablet  Generic drug:  elvitegravir-cobicistat-emtricitabine-tenofovir  Take 1 tablet by mouth daily with breakfast.     topiramate 100 MG tablet  Commonly known as:  TOPAMAX  Take 1 tablet (100 mg total) by mouth daily.     traZODone 50 MG tablet  Commonly known as:  DESYREL  Take 50 mg by mouth at bedtime.     valACYclovir 500 MG tablet  Commonly known as:  VALTREX  Take 500 mg by mouth 2 (two) times daily as needed (flare ups).     zolpidem 5 MG tablet  Commonly known as:  AMBIEN  Take 1 tablet (5 mg total) by mouth at bedtime as needed for sleep.        Disposition and follow-up:   [name] was discharged from [facility] in Stable condition.  At the hospital follow up visit please address:  1.  Left arm strength and sensation   2.  Medication compliance with ASA, Plavix; tolerance of higher statin dose  3.  Labs / imaging needed at time of follow-up: none  4.  Pending labs/ test needing follow-up:  none  Follow-up Appointments: Follow-up Information   Follow up with Fransisca Kaufmann, MD On 09/18/2013. (3:45p)    Specialty:  Internal Medicine   Contact information:   [place] [address] [phone]       Follow up with [facility]. (home health physical and occupational therapy)    Contact information:   [address] [place] [address] [phone]       Discharge Instructions: Discharge Orders   Future Appointments Provider Department Dept Phone   09/18/2013 3:45 PM Othella Boyer, MD [place] [phone]   10/08/2013 2:00 PM Philmore Pali, NP [facility] [phone]   10/18/2013 2:00 PM [facility] [phone]   11/01/2013 2:30 PM Thayer Headings, MD [facility] [phone]   11/08/2013 3:30 PM Joni Reining, DO [facility] Internal [place] [phone]   Future Orders Complete By Expires   Call MD for:  persistant dizziness or light-headedness  As directed    Diet - low sodium heart healthy  As directed    Increase activity slowly  As directed       Consultations: neurology (stroke team)  Procedures Performed:  Dg Shoulder Right  08/26/2013   CLINICAL DATA:  Fall.  Right shoulder injury.  EXAM: RIGHT SHOULDER - 2+ VIEW  COMPARISON:  None.  FINDINGS: Subacromial spurring is present. Mild to moderate AC joint osteoarthritis. The right shoulder is located. There is no fracture.  IMPRESSION: No acute osseous abnormality.  AC joint osteoarthritis.   Electronically Signed   By: Dereck Ligas M.D.   On: 08/26/2013 17:48   Dg Ankle Complete Right  08/26/2013   CLINICAL DATA:  Status post fall.  Medial right ankle pain.  EXAM: RIGHT ANKLE - COMPLETE 3+ VIEW  COMPARISON:  Plain films right ankle 12/19/2012.  FINDINGS: No fracture is identified. There is no dislocation. There is severe and  worsened tibiotalar joint space narrowing. There is a tibiotalar joint effusion as on the prior study. Prominent plantar calcaneal spur is noted.  IMPRESSION: No acute finding.  Marked and worsened tibiotalar joint space narrowing with it joint effusion. Question gout or inflammatory arthropathy.   Electronically Signed   By: Inge Rise M.D.   On: 08/26/2013 17:50   Ct Head Wo Contrast  09/14/2013   CLINICAL DATA:  Code stroke.  Numbness and weakness left arm  EXAM: CT HEAD WITHOUT CONTRAST  TECHNIQUE: Contiguous axial images were obtained from the base of the skull through the vertex without intravenous contrast.  COMPARISON:  CT 09/14/2011  FINDINGS: Chronic infarct right cerebellum. Hypodensity in the deep white matter on the right is unchanged from the MRI of 06/13/2012 and most consistent with chronic microvascular ischemia.  Negative for acute infarct.  Negative for hemorrhage or mass.  IMPRESSION: Chronic ischemic changes are stable.  No acute abnormality.  Critical Value/emergent results were called by telephone at the time of interpretation on 09/14/2013 at 9:07 AM to Dr. Wallie Char, who verbally acknowledged these results.   Electronically Signed   By: Franchot Gallo M.D.   On: 09/14/2013 09:08   Mr Brain Wo Contrast  09/14/2013   CLINICAL DATA:  Stroke. History of stroke in 2013. This morning began feeling weak and numb in the left arm. Mild word-finding difficulty.  EXAM: MRI HEAD WITHOUT CONTRAST  MRA HEAD WITHOUT CONTRAST  TECHNIQUE: Multiplanar, multiecho pulse sequences of the brain and surrounding structures were obtained without intravenous contrast. Angiographic images of the head were obtained using MRA technique without contrast.  COMPARISON:  Head CT 09/14/2013.  Brain MRI and MRA 06/13/2012.  FINDINGS: MRI HEAD FINDINGS  Images are mildly degraded by motion artifact. There are 2 punctate foci of cortical restricted diffusion in the right frontal and right parietal lobes. There is  no intracranial hemorrhage, mass, midline shift, or extra-axial fluid collection. Old infarcts are noted in the right centrum semiovale/ corona radiata and right cerebellum, unchanged. Ventricles and sulci are within normal limits. Major intracranial vascular flow voids are unremarkable. Prior right scleral buckle is noted. Paranasal sinuses and mastoid air cells are clear.  MRA HEAD FINDINGS  Visualized distal vertebral arteries are patent and codominant. PICA origins are patent bilaterally. Basilar artery is patent without stenosis. Right SCA origin is patent. Left SCA is not identified. PCA origins and visualized branches are patent and unremarkable. Bilateral posterior communicating arteries are present. Internal carotid arteries are patent from skullbase to carotid terminus. ACA and MCA origins and visualized branches are unremarkable. No intracranial aneurysm is identified.  IMPRESSION: 1. Two punctate foci of acute infarct in the right frontal and right parietal lobes. 2. No evidence of major intracranial arterial occlusion or flow-limiting stenosis.   Electronically Signed   By: Logan Bores   On: 09/14/2013 17:58   Mr [name] Head/brain Wo Cm  09/14/2013   CLINICAL DATA:  Stroke. History of stroke in 2013. This morning began feeling weak and numb in the left arm. Mild word-finding difficulty.  EXAM: MRI HEAD WITHOUT CONTRAST  MRA HEAD WITHOUT CONTRAST  TECHNIQUE: Multiplanar, multiecho pulse sequences of the brain and surrounding structures were obtained without intravenous contrast. Angiographic images of the head were obtained using MRA technique without contrast.  COMPARISON:  Head CT 09/14/2013.  Brain MRI and MRA 06/13/2012.  FINDINGS: MRI HEAD FINDINGS  Images are mildly degraded by motion artifact. There are 2 punctate foci of cortical restricted diffusion in the right frontal and right parietal lobes. There is no intracranial hemorrhage, mass, midline shift, or extra-axial fluid collection. Old  infarcts are noted in the right centrum semiovale/ corona radiata and right cerebellum, unchanged. Ventricles and sulci are within normal limits. Major intracranial vascular flow voids are unremarkable. Prior right scleral buckle is noted. Paranasal sinuses and mastoid air cells are clear.  MRA HEAD  FINDINGS  Visualized distal vertebral arteries are patent and codominant. PICA origins are patent bilaterally. Basilar artery is patent without stenosis. Right SCA origin is patent. Left SCA is not identified. PCA origins and visualized branches are patent and unremarkable. Bilateral posterior communicating arteries are present. Internal carotid arteries are patent from skullbase to carotid terminus. ACA and MCA origins and visualized branches are unremarkable. No intracranial aneurysm is identified.  IMPRESSION: 1. Two punctate foci of acute infarct in the right frontal and right parietal lobes. 2. No evidence of major intracranial arterial occlusion or flow-limiting stenosis.   Electronically Signed   By: Logan Bores   On: 09/14/2013 17:58    2D Echo:  EF 25%,  anteroseptal, inferoseptal, and anterior wall akinesis, grade 1 diastolic dysfunction.   Admission HPI:  Jerrilyn Messinger is 53 y.o. female with PMH of stroke in April 2013, hypertension, hyperlipidemia, HIV (CD4 450 and viral load 29 on 06/19/13), coronary artery disease (s/p of MI and stent placement), CHF (EF 25% on 2-D echo 02/20/12), who presents with left arm weakness and numbness.  Patient reports that she had stroke in 2013, she was supposed to take both aspirin and Plavix for secondary stroke prophylaxis. She has been taking aspirin regularly, but ran out of her Plavix for at least a week. She reports that she was about go to her school [employer]) at about 7:45 AM. She started her car and was waiting for warming up, she suddenly started feeling weak and numb in her left arm. There is no weakness in the lower extremities. Her right arm is normal.  She denies dysphasia and dysarthria, but when I interviewed the patient, she had very mild difficulty in finding the right words. Patient also reports tightness and tonic flexion of her right upper extremity with shaking, which has resolved spontaneously. CT-head done in ED showed chronic ischemic changes involving deep white matter on the right and right cerebellum. The symptoms gradually improved, but she still feels little weak in the left arm, and also in the left leg. Neurology was consulted by ED physician. Dr. Nicole Kindred evaluated pt and thought that patient did not need tPA therapy and recommended stroke workup for possible TIA or recurrent subcortical right cerebral infarction. Since a new-onset right focal seizures cannot be ruled out, EEG was also recommended by Dr. Nicole Kindred.    Hospital Course by problem list: 1. Stroke- Patient presented with left arm weakness and numbness, improved now but not resolved.  Neurology was consulted and recommended stroke workup with MRI/MRA brain and EEG.  MRI/MRA results above but showed acute punctate infarcts in right frontal and right parietal lobes.  EEG negative. Troponin x 3 negative. BUE dopplers negative.  Echo with contrast showed EF 25% with anteroseptal, inferoseptal, and anterior wall akinesis, grade 1 diastolic dysfunction.  Of note, patient has PFO listed on her problem list but no PFO seen on TEE in 12/2011.  Risk stratification: A1C 5.9%, LDL 121.  Increased dose of Crestor from 5 mg daily to 40 mg daily.  Continued ASA and Plavix for secondary stroke prevention per neuro recs (not considered failure on this regimen as patient admitted to noncompliance with Plavix for 1 week prior to stroke).  Due to concern for embolic source, cardiology was consulted and agree to place loop recorder on 1/13 as outpatient; patient amenable.  PT/OT recommended home health which was arranged.  Patient can also get home SLP if she desires for ?voice changes.  Neurology  follow-up.  2. HTN- BP low-normotensive while inpatient.  Held home lisinopril and HCTZ while inpatient for permissive HTN x 48 hours (through 1/11); patient was instructed to restart these medications on day after discharge.  Continued Coreg given cardiac history.    3. HL- LDL 121. On Crestor 5 mg daily at home. Allergy to Lipitor.  Increased Crestor to 40 mg daily while inpatient and at discharge.   4. Chronic systolic heart failure- Echo results per above. Patient does not appear volume overloaded on exam.  Weight decreased from prior.  Continued Coreg 3.125 mg BID while inpatient and at discharge.  Held lisinopril while inpatient, instructed patient to restart on 1/11 (held for permissive hypertension x 48 hours given stroke).   5. CAD- MI s/p stent placement x 2. No chest pain, EKG unchanged, troponin negative. Continued ASA, Imdur while inpatient and at discharge.   6. HIV- well controlled, continued home Stribild while inpatient and at discharge.    Discharge Vitals:   BP 100/50  Pulse 96  Temp(Src) 97.9 F (36.6 C) (Oral)  Resp 20  Ht 5\' 2"  (1.575 m)  Wt 229 lb 6.4 oz (104.055 kg)  BMI 41.95 kg/m2  SpO2 98%  Discharge Labs:  No results found for this or any previous visit (from the past 24 hour(s)).  Signed: Ivin Poot, MD 09/17/2013, 7:26 AM   Time Spent on Discharge: 40 minutes Services Ordered on Discharge: HH-PT and OT Equipment Ordered on Discharge: none

## 2013-09-15 NOTE — Discharge Instructions (Signed)
Please take your medications exactly as prescribed.    It is very important that you do not miss any doses of aspirin or Plavix.  You should not stop taking Plavix for eye surgery any time in the next 3 months.   You should resume your blood pressure medicines (lisinopril and hydrochlorothiazide) TOMORROW!  We sent a new prescription for a higher dose of your cholesterol medicine in to [facility].  Please pick this up ASAP and start taking the new dose.   Do not forget your appointment to have loop recorder placed with cardiology on Tuesday morning and follow-up appointment in [facility] on Tuesday afternoon.   We are arranging home health physical and occupational therapy.   Stroke Prevention Some medical conditions and behaviors are associated with an increased chance of having a stroke. You may prevent a stroke by making healthy choices and managing medical conditions. HOW CAN I REDUCE MY RISK OF HAVING A STROKE?   Stay physically active. Get at least 30 minutes of activity on most or all days.  Do not smoke. It may also be helpful to avoid exposure to secondhand smoke.  Limit alcohol use. Moderate alcohol use is considered to be:  No more than 2 drinks per day for men.  No more than 1 drink per day for nonpregnant women.  Eat healthy foods. This involves  Eating 5 or more servings of fruits and vegetables a day.  Following a diet that addresses high blood pressure (hypertension), high cholesterol, diabetes, or obesity.  Manage your cholesterol levels.  A diet low in saturated fat, trans fat, and cholesterol and high in fiber may control cholesterol levels.  Take any prescribed medicines to control cholesterol as directed by your health care provider.  Manage your diabetes.  A controlled-carbohydrate, controlled-sugar diet is recommended to manage diabetes.  Take any prescribed medicines to control diabetes as directed by your health care provider.  Control your  hypertension.  A low-salt (sodium), low-saturated fat, low-trans fat, and low-cholesterol diet is recommended to manage hypertension.  Take any prescribed medicines to control hypertension as directed by your health care provider.  Maintain a healthy weight.  A reduced-calorie, low-sodium, low-saturated fat, low-trans fat, low-cholesterol diet is recommended to manage weight.  Stop drug abuse.  Avoid taking birth control pills.  Talk to your health care provider about the risks of taking birth control pills if you are over 104 years old, smoke, get migraines, or have ever had a blood clot.  Get evaluated for sleep disorders (sleep apnea).  Talk to your health care provider about getting a sleep evaluation if you snore a lot or have excessive sleepiness.  Take medicines as directed by your health care provider.  For some people, aspirin or blood thinners (anticoagulants) are helpful in reducing the risk of forming abnormal blood clots that can lead to stroke. If you have the irregular heart rhythm of atrial fibrillation, you should be on a blood thinner unless there is a good reason you cannot take them.  Understand all your medicine instructions.  Make sure that other other conditions (such as anemia or atherosclerosis) are addressed. SEEK IMMEDIATE MEDICAL CARE IF:   You have sudden weakness or numbness of the face, arm, or leg, especially on one side of the body.  Your face or eyelid droops to one side.  You have sudden confusion.  You have trouble speaking (aphasia) or understanding.  You have sudden trouble seeing in one or both eyes.  You have sudden trouble  walking.  You have dizziness.  You have a loss of balance or coordination.  You have a sudden, severe headache with no known cause.  You have new chest pain or an irregular heartbeat. Any of these symptoms may represent a serious problem that is an emergency. Do not wait to see if the symptoms will go away. Get  medical help at once. Call your local emergency services  (911 in [place] Do not drive yourself to the hospital. Document Released: 09/30/2004 Document Revised: 06/13/2013 Document Reviewed: 02/23/2013 [facility] Patient Information 2014 [place].

## 2013-09-15 NOTE — Procedures (Signed)
ELECTROENCEPHALOGRAM REPORT   Patient: [name]       Room #: 4Y81  EEG No. ID: [id number] Age: 53 y.o.        Sex: female Referring Physician: Daryll Drown Report Date:  09/15/2013        Interpreting Physician: Anthony Sar  History: [name] is an 53 y.o. female history of hypertension, hyperlipidemia and previous cerebral infarctions, who presented with new-onset left-sided weakness as well as transient involuntary tightening of her left upper extremity.  Indications for study:  Rule out new onset focal seizure disorder.  Technique: This is an 18 channel routine scalp EEG performed at the bedside with bipolar and monopolar montages arranged in accordance to the international 10/20 system of electrode placement.   Description: This EEG recording was performed during wakefulness and during brief light sleep. Background activity during wakefulness consists of 10 Hz symmetrical alpha rhythm which attenuated well with eye opening. During light sleep to slowing of background activity diffusely and symmetrically with symmetrical sleep spindles and vertex waves recorded. Photic stimulation was not performed. Hyperventilation was not performed. No epileptiform discharges recorded. There was no abnormal slowing of cerebral activity.  Interpretation: This is a normal EEG recording during wakefulness and during light sleep. No evidence of an epileptic disorder was demonstrated.   Rush Farmer M.D. [facility] [phone]

## 2013-09-15 NOTE — Progress Notes (Signed)
Patient d/ced home this evening. Instructions given and dully signed.

## 2013-09-15 NOTE — Progress Notes (Signed)
    CARE MANAGEMENT NOTE 09/15/2013  Patient:  KALI, DEADWYLER   Account Number:  [id number]  Date Initiated:  09/15/2013  Documentation initiated by:  [facility]  Subjective/Objective Assessment:   adm:  left arm weakness     Action/Plan:   discharge planing   Anticipated DC Date:  09/15/2013   Anticipated DC Plan:  [place]  CM consult      [facility] Choice  HOME HEALTH   Choice offered to / List presented to:          [facility] arranged  [place].   Status of service:  Completed, signed off Medicare Important Message given?   (If response is "NO", the following Medicare IM given date fields will be blank) Date Medicare IM given:   Date Additional Medicare IM given:    Discharge Disposition:  [place]  Per UR Regulation:    If discussed at Long Length of Stay Meetings, dates discussed:    Comments:  09/15/13 15:40 CM spoke with pt to offer choice for HHPT/OT. Pt chooses [facility] to render these services.  Address and contact numbers were verified.  Referral faxed to [facility].  No other CM needs were communicated.  Mariane Masters, BSN, CM [phone].

## 2013-09-17 ENCOUNTER — Encounter (HOSPITAL_COMMUNITY): Payer: Self-pay | Admitting: Respiratory Therapy

## 2013-09-18 ENCOUNTER — Encounter: Payer: Self-pay | Admitting: Internal Medicine

## 2013-09-18 ENCOUNTER — Ambulatory Visit (HOSPITAL_COMMUNITY)
Admission: RE | Admit: 2013-09-18 | Discharge: 2013-09-18 | Disposition: A | Discharge status: Home / Self Care | Payer: Medicare HMO | Source: Ambulatory Visit | Attending: Internal Medicine | Admitting: Internal Medicine

## 2013-09-18 ENCOUNTER — Encounter (HOSPITAL_COMMUNITY)
Admission: RE | Disposition: A | Discharge status: Home / Self Care | Payer: Self-pay | Source: Ambulatory Visit | Attending: Internal Medicine

## 2013-09-18 ENCOUNTER — Other Ambulatory Visit: Payer: Self-pay | Admitting: *Deleted

## 2013-09-18 ENCOUNTER — Ambulatory Visit (INDEPENDENT_AMBULATORY_CARE_PROVIDER_SITE_OTHER): Payer: Medicare HMO | Admitting: Internal Medicine

## 2013-09-18 VITALS — BP 135/95 | HR 63 | Temp 97.7°F | Ht 62.0 in | Wt 230.1 lb

## 2013-09-18 DIAGNOSIS — I509 Heart failure, unspecified: Secondary | ICD-10-CM | POA: Insufficient documentation

## 2013-09-18 DIAGNOSIS — I639 Cerebral infarction, unspecified: Secondary | ICD-10-CM | POA: Diagnosis present

## 2013-09-18 DIAGNOSIS — Z7982 Long term (current) use of aspirin: Secondary | ICD-10-CM | POA: Insufficient documentation

## 2013-09-18 DIAGNOSIS — E039 Hypothyroidism, unspecified: Secondary | ICD-10-CM | POA: Insufficient documentation

## 2013-09-18 DIAGNOSIS — M129 Arthropathy, unspecified: Secondary | ICD-10-CM | POA: Insufficient documentation

## 2013-09-18 DIAGNOSIS — I10 Essential (primary) hypertension: Secondary | ICD-10-CM | POA: Insufficient documentation

## 2013-09-18 DIAGNOSIS — J4489 Other specified chronic obstructive pulmonary disease: Secondary | ICD-10-CM | POA: Insufficient documentation

## 2013-09-18 DIAGNOSIS — Z21 Asymptomatic human immunodeficiency virus [HIV] infection status: Secondary | ICD-10-CM | POA: Insufficient documentation

## 2013-09-18 DIAGNOSIS — I252 Old myocardial infarction: Secondary | ICD-10-CM | POA: Insufficient documentation

## 2013-09-18 DIAGNOSIS — E7800 Pure hypercholesterolemia, unspecified: Secondary | ICD-10-CM | POA: Insufficient documentation

## 2013-09-18 DIAGNOSIS — I39 Endocarditis and heart valve disorders in diseases classified elsewhere: Secondary | ICD-10-CM

## 2013-09-18 DIAGNOSIS — Z7902 Long term (current) use of antithrombotics/antiplatelets: Secondary | ICD-10-CM | POA: Insufficient documentation

## 2013-09-18 DIAGNOSIS — M1710 Unilateral primary osteoarthritis, unspecified knee: Secondary | ICD-10-CM

## 2013-09-18 DIAGNOSIS — M1712 Unilateral primary osteoarthritis, left knee: Secondary | ICD-10-CM

## 2013-09-18 DIAGNOSIS — J449 Chronic obstructive pulmonary disease, unspecified: Secondary | ICD-10-CM | POA: Insufficient documentation

## 2013-09-18 DIAGNOSIS — IMO0002 Reserved for concepts with insufficient information to code with codable children: Secondary | ICD-10-CM

## 2013-09-18 DIAGNOSIS — I2510 Atherosclerotic heart disease of native coronary artery without angina pectoris: Secondary | ICD-10-CM | POA: Insufficient documentation

## 2013-09-18 DIAGNOSIS — Z8673 Personal history of transient ischemic attack (TIA), and cerebral infarction without residual deficits: Secondary | ICD-10-CM

## 2013-09-18 DIAGNOSIS — Z9861 Coronary angioplasty status: Secondary | ICD-10-CM | POA: Insufficient documentation

## 2013-09-18 DIAGNOSIS — I6350 Cerebral infarction due to unspecified occlusion or stenosis of unspecified cerebral artery: Secondary | ICD-10-CM | POA: Insufficient documentation

## 2013-09-18 HISTORY — PX: LOOP RECORDER IMPLANT: SHX5477

## 2013-09-18 HISTORY — PX: LOOP RECORDER IMPLANT: SHX5954

## 2013-09-18 SURGERY — LOOP RECORDER IMPLANT
Anesthesia: LOCAL

## 2013-09-18 MED ORDER — NAPROXEN 500 MG PO TABS: 500.0000 mg | ORAL_TABLET | Freq: Two times a day (BID) | ORAL | Status: DC

## 2013-09-18 MED ORDER — CLOPIDOGREL BISULFATE 75 MG PO TABS: 75.0000 mg | ORAL_TABLET | Freq: Every day | ORAL | Status: DC

## 2013-09-18 MED ORDER — LIDOCAINE-EPINEPHRINE 1 %-1:100000 IJ SOLN
INTRAMUSCULAR | Status: AC
  Filled 2013-09-18: qty 1

## 2013-09-18 NOTE — Discharge Summary (Signed)
I assisted in the discharge planning and discussed the patient case with the housestaff.

## 2013-09-18 NOTE — Progress Notes (Signed)
Subjective:   Patient ID: [name] female   DOB: 08/08/1961 53 y.o.   MRN: [id number]  Chief Complaint  Patient presents with  . Follow-up    HERE FOR ORTHO REFERRAL  . Ankle Pain    RIGHT FOOT / ANKLE  PAIN #10     HPI: [name] is a 53 y.o. woman with h/o HIV, HLD, obesity, anxiety, HTN, CAD, OA, COPD, OSA, CVA who presents for above complaints. She was discharged from the hospital on 09/15/13 after being admitted for stroke.  Since before hospitalization, she complains of right leg (ankle) pain after twisting it after stepping on something. Went to urgent care on 08/26/13 and had XR done (08/2013 XR: severe and worsened tibiotalar joint space narrowing).  Went to [facility] (Dr. Tamera Punt), told not candidate for ankle replacement, but rec ankle fusion - that office does not do this, so wanted patient to be referred to [facility] ortho, which has to happen through PCP.  Has been treating with naproxen + percocet; worse with movement.  Hasn't had plavix - wasn't at pharmacy, only crestor was there. Reports arm weakness has recovered.   S/p loop recorder today.   Review of Systems: Constitutional: Denies fever, chills, diaphoresis, appetite change and fatigue.  HEENT: Denies photophobia, eye pain, redness, hearing loss, ear pain, congestion, sore throat, sneezing, mouth sores, trouble swallowing, neck pain, neck stiffness and tinnitus.  Respiratory: Denies SOB, DOE, cough, chest tightness, and wheezing.  Cardiovascular: Denies chest pain, palpitations and leg swelling.  Gastrointestinal: Denies nausea, vomiting, abdominal pain, diarrhea, constipation,blood in stool and abdominal distention.  Genitourinary: Denies dysuria, urgency, frequency, hematuria, flank pain and difficulty urinating.  Musculoskeletal: per HPI Skin: denies pallor, rash and wound.  Neurological: Denies dizziness, seizures, syncope, weakness, lightheadedness, numbness and headaches.    Past  Medical History  Diagnosis Date  . Cholecystitis     Gall bladder removed   . Hypercholesterolemia   . Fibroids   . HIV (human immunodeficiency virus infection) 05/27/11    CD4 460, VL undetectable 10/12/12  . Pelvic pain in female 10/14/11  . PMB (postmenopausal bleeding)     Since 2010  . Increased BMI 05/27/11  . Anxiety   . Depression   . Hypertension   . H/O varicella   . History of measles, mumps, or rubella   . Blood transfusion 1995    "related to losing blood in urine during pregnancy"  . Hypothyroidism     Reports h/o hypothyroidism; not on any meds, TSH wnl over several years  . 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 Myocardial infarction 07/2004  . COPD (chronic obstructive pulmonary disease)     well controlled, only using albuterol inhaler occasionally   . Iron deficiency anemia   . Lower GI bleed 04/24/2012    from hemorrhoids. No recent colonoscopy   . CAD in native artery     s/p stent and restenting of LAD. On plavix  . Anginal pain   . Pneumonia 1980's    "once"  . Chronic bronchitis     "get it q yr" (09/14/2013)  . Stroke 12/2011    R insular ischemic CVA ; residual "speech messes up when I get sick or real tired" (09/14/2013)  . Arthritis     "knees; left shoulder; right anklef/foot" (09/14/2013)  . Bursitis, shoulder     "right"  . Chronic lower back  pain    Current Outpatient Prescriptions  Medication Sig Dispense Refill  . acetaminophen (TYLENOL) 500 MG tablet Take 1,000 mg by mouth every 6 (six) hours as needed for mild pain.       Marland Kitchen albuterol (PROVENTIL HFA;VENTOLIN HFA) 108 (90 BASE) MCG/ACT inhaler Inhale 2 puffs into the lungs every 6 (six) hours as needed. For wheezing and shortness of breath      . aspirin 81 MG EC tablet Take 1 tablet (81 mg total) by mouth daily. Swallow whole.  30 tablet  12  . bacitracin ointment Apply 1 application topically 2 (two) times daily. To affected  area x 7 days  120 g  0  . carvedilol (COREG) 3.125 MG tablet Take 1 tablet (3.125 mg total) by mouth 2 (two) times daily with a meal. 9am and 6pm  60 tablet  5  . clopidogrel (PLAVIX) 75 MG tablet Take 75 mg by mouth daily.      . diphenoxylate-atropine (LOMOTIL) 2.5-0.025 MG per tablet Take 1 tablet by mouth 4 (four) times daily as needed. For loose stool      . elvitegravir-cobicistat-emtricitabine-tenofovir (STRIBILD) 150-150-200-300 MG TABS tablet Take 1 tablet by mouth daily with breakfast.      . ferrous sulfate 325 (65 FE) MG tablet Take 1 tablet (325 mg total) by mouth 3 (three) times daily with meals.  90 tablet  3  . gabapentin (NEURONTIN) 400 MG capsule Take 1 capsule (400 mg total) by mouth 3 (three) times daily.  90 capsule  5  . hydrochlorothiazide (HYDRODIURIL) 25 MG tablet Take 1 tablet (25 mg total) by mouth daily.  30 tablet  11  . isosorbide mononitrate (IMDUR) 60 MG 24 hr tablet Take 1 tablet (60 mg total) by mouth every evening. 5pm  30 tablet  5  . lisinopril (PRINIVIL,ZESTRIL) 5 MG tablet Take 1 tablet (5 mg total) by mouth daily.  30 tablet  11  . lubiprostone (AMITIZA) 24 MCG capsule Take 1 capsule (24 mcg total) by mouth daily with breakfast.  30 capsule  3  . Multiple Vitamin (MULTIVITAMIN WITH MINERALS) TABS Take 1 tablet by mouth daily.      . naproxen (NAPROSYN) 500 MG tablet Take 1 tablet (500 mg total) by mouth 2 (two) times daily.  30 tablet  0  . nitroGLYCERIN (NITROSTAT) 0.3 MG SL tablet Place 0.3 mg under the tongue every 5 (five) minutes as needed. For chest pain      . pantoprazole (PROTONIX) 20 MG tablet Take 20 mg by mouth daily.      . phentermine 37.5 MG capsule Take 1 capsule (37.5 mg total) by mouth every morning.  30 capsule  0  . rosuvastatin (CRESTOR) 40 MG tablet Take 1 tablet (40 mg total) by mouth daily at 6 PM.  30 tablet  0  . topiramate (TOPAMAX) 100 MG tablet Take 1 tablet (100 mg total) by mouth daily.  30 tablet  2  . traZODone (DESYREL) 50  MG tablet Take 50 mg by mouth at bedtime.      . valACYclovir (VALTREX) 500 MG tablet Take 500 mg by mouth 2 (two) times daily as needed (flare ups).      . zolpidem (AMBIEN) 5 MG tablet Take 1 tablet (5 mg total) by mouth at bedtime as needed for sleep.  30 tablet  0   No current facility-administered medications for this visit.   Family History  Problem Relation Age of Onset  . Hypertension Mother   .  Hyperlipidemia Mother   . Obesity Mother   . Heart disease Mother   . Hypertension Maternal Aunt   . Hyperlipidemia Maternal Aunt   . Obesity Maternal Aunt   . Stroke Maternal Aunt    History   Social History  . Marital Status: Single    Spouse Name: N/A    Number of Children: N/A  . Years of Education: N/A   Social History Main Topics  . Smoking status: Former Smoker -- 0.50 packs/day for 29 years    Types: Cigarettes    Quit date: 05/07/2006  . Smokeless tobacco: Never Used  . Alcohol Use: 0.0 oz/week     Comment: 09/14/2013 "wine once or twice q couple months"  . Drug Use: Yes    Special: Cocaine, Marijuana     Comment: 09/14/2013  pt. states has not done any drugs  since 3/201"  . Sexual Activity: No     Comment: gave her condoms   Other Topics Concern  . None   Social History Narrative  . None    Objective:  Physical Exam: Filed Vitals:   09/18/13 1600  BP: 135/95  Pulse: 63  Temp: 97.7 F (36.5 C)  TempSrc: Oral  Height: 5\' 2"  (1.575 m)  Weight: 230 lb 1.6 oz (104.373 kg)  SpO2: 100%   HEENT: PERRL, EOMI, no scleral icterus, irritated nasal mucosa (R) Cardiac: RRR, no rubs, murmurs or gallops Pulm: clear to auscultation bilaterally, moving normal volumes of air Abd: soft, nontender, nondistended, BS present Ext: warm and well perfused, right foot boot - limited ROM of right ankle with mild lateral maleolus swelling, most tender over on medial and lateral aspects of ankle; 5/5 UE strength b/l Neuro: alert and oriented X3, cranial nerves II-XII grossly  intact  Assessment & Plan:  Case and care discussed with Dr. Eppie Gibson.  Please see problem oriented charting for further details. Patient to return in 3 months for routine f/u with pcp  Handicap placard filled out - to expire 11/03/13 per what was already printed on sheet - d/t OA

## 2013-09-18 NOTE — CV Procedure (Signed)
SURGEON:  Thompson Grayer, MD     PREPROCEDURE DIAGNOSIS:  Cryptogenic Stroke    POSTPROCEDURE DIAGNOSIS:  Cryptogenic Stroke     PROCEDURES:   1. Implantable loop recorder implantation    INTRODUCTION:  [name] is a 52 y.o. female with a history of unexplained stroke who presents today for implantable loop implantation.  The patient has had recurrent cryptogenic embolic strokes.  Despite an extensive workup by neurology, no reversible causes have been identified.  she has worn telemetry during which she did not have arrhythmias.  There is significant concern for possible atrial fibrillation as the cause for the patients stroke.  The patient therefore presents today for implantable loop implantation.     DESCRIPTION OF PROCEDURE:  Informed written consent was obtained, and the patient was brought to the electrophysiology lab in a fasting state.  The patient required no sedation for the procedure today.  Mapping over the patient's chest was performed by the EP lab staff to identify the area where electrograms were most prominent for ILR recording.  This area was found to be the left parasternal region over the 3rd-4th intercostal space. The patients left chest was therefore prepped and draped in the usual sterile fashion by the EP lab staff. The skin overlying the left parasternal region was infiltrated with lidocaine for local analgesia.  A 0.5-cm incision was made over the left parasternal region over the 3rd intercostal space.  A subcutaneous ILR pocket was fashioned using a combination of sharp and blunt dissection.  A Medtronic Reveal Kuna model [id number] implantable loop recorder was then placed into the pocket  R waves were very prominent and measured 0.32mV.  Steri- Strips and a sterile dressing were then applied.  There were no early apparent complications.     CONCLUSIONS:   1. Successful implantation of a Medtronic Reveal LINQ implantable loop recorder for cryptogenic  stroke  2. No early apparent complications.

## 2013-09-18 NOTE — Assessment & Plan Note (Signed)
Referal to [facility] ortho per patient (seen at [facility], ankle fusion was recommended, not candidate for replacement, but fusion would be done by [place] orth[facility]). Referral placed.

## 2013-09-18 NOTE — Discharge Instructions (Signed)
Wound Care Wound care helps prevent pain and infection.   HOME CARE   Only take medicine as told by your doctor.  Remove bandages (dressings) tomorrow or as told by your doctor.  Change the bandage if it gets wet, dirty, or starts to smell.  Take showers. Do not take baths, swim, or do anything that puts your wound under water for a week.  Keep all doctor visits as told. GET HELP RIGHT AWAY IF:   Yellowish-white fluid (pus) comes from the wound.  Medicine does not lessen your pain.  There is a red streak going away from the wound.  You have a fever. MAKE SURE YOU:   Understand these instructions.  Will watch your condition.  Will get help right away if you are not doing well or get worse. Document Released: 06/01/2008 Document Revised: 11/15/2011 Document Reviewed: 12/27/2010 [facility] Patient Information 2014 [place], [place].

## 2013-09-18 NOTE — Patient Instructions (Signed)
-   I refilled your plavix -We will work on your referral tomorrow  Please be sure to bring all of your medications with you to every visit.  Should you have any new or worsening symptoms, please be sure to call the clinic at [phone].

## 2013-09-18 NOTE — Assessment & Plan Note (Addendum)
Compliant with statin, though not the escalated dose (she wanted to finish the pills she had - told her to be sure to be taking 40mg ); refilled plavix; patient is s/p loop recorder placement today.
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
## 2013-09-18 NOTE — Interval H&P Note (Signed)
History and Physical Interval Note:  09/18/2013 11:59 AM  [name]  has presented today for surgery, with the diagnosis of syncope  The various methods of treatment have been discussed with the patient and family. After consideration of risks, benefits and other options for treatment, the patient has consented to  Procedure(s): LOOP RECORDER IMPLANT (N/A) as a surgical intervention .  [name] history has been reviewed, patient examined, no change in status, stable for surgery.  I have reviewed the patient's chart and labs.  Questions were answered to the patient's satisfaction.     [name]

## 2013-09-18 NOTE — Addendum Note (Signed)
Addended by: Othella Boyer on: 09/18/2013 05:24 PM   Modules accepted: Orders

## 2013-09-19 MED ORDER — OXYCODONE-ACETAMINOPHEN 10-325 MG PO TABS: 1.0000 | ORAL_TABLET | Freq: Four times a day (QID) | ORAL | Status: DC | PRN

## 2013-09-19 NOTE — Telephone Encounter (Signed)
Called and spoke with patient. Let her know she can pick up a refill for percocet at the clinic.  I told her she has completed the recommended course of Phentermine and that I would not prescribe it further, I also let her know this could be dangerous given her recent stroke. She reported she may need a letter for school for extra time.  I told her to find out what she needs and she can drop off a letter for me, and I let her know we may need to do an earlier appointment to complete this documentation.

## 2013-09-21 ENCOUNTER — Encounter (HOSPITAL_COMMUNITY): Payer: Self-pay | Admitting: *Deleted

## 2013-09-27 ENCOUNTER — Encounter: Payer: Self-pay | Admitting: Internal Medicine

## 2013-09-27 ENCOUNTER — Ambulatory Visit: Payer: Medicare HMO

## 2013-09-29 NOTE — Progress Notes (Signed)
Case discussed with Dr. Sharda soon after the resident saw the patient.  We reviewed the resident's history and exam and pertinent patient test results.  I agree with the assessment, diagnosis, and plan of care documented in the resident's note. 

## 2013-10-02 ENCOUNTER — Emergency Department (HOSPITAL_COMMUNITY): Payer: Medicare HMO

## 2013-10-02 ENCOUNTER — Observation Stay (HOSPITAL_COMMUNITY)
Admission: EM | Admit: 2013-10-02 | Discharge: 2013-10-03 | Disposition: A | Discharge status: Home / Self Care | Payer: Medicare HMO | Attending: Internal Medicine | Admitting: Internal Medicine

## 2013-10-02 ENCOUNTER — Encounter (HOSPITAL_COMMUNITY): Payer: Self-pay | Admitting: Emergency Medicine

## 2013-10-02 DIAGNOSIS — K5730 Diverticulosis of large intestine without perforation or abscess without bleeding: Secondary | ICD-10-CM

## 2013-10-02 DIAGNOSIS — D259 Leiomyoma of uterus, unspecified: Secondary | ICD-10-CM | POA: Insufficient documentation

## 2013-10-02 DIAGNOSIS — I252 Old myocardial infarction: Secondary | ICD-10-CM | POA: Insufficient documentation

## 2013-10-02 DIAGNOSIS — M719 Bursopathy, unspecified: Secondary | ICD-10-CM | POA: Insufficient documentation

## 2013-10-02 DIAGNOSIS — M1712 Unilateral primary osteoarthritis, left knee: Secondary | ICD-10-CM

## 2013-10-02 DIAGNOSIS — I509 Heart failure, unspecified: Secondary | ICD-10-CM | POA: Insufficient documentation

## 2013-10-02 DIAGNOSIS — R011 Cardiac murmur, unspecified: Secondary | ICD-10-CM | POA: Insufficient documentation

## 2013-10-02 DIAGNOSIS — E7800 Pure hypercholesterolemia, unspecified: Secondary | ICD-10-CM | POA: Insufficient documentation

## 2013-10-02 DIAGNOSIS — I639 Cerebral infarction, unspecified: Secondary | ICD-10-CM

## 2013-10-02 DIAGNOSIS — M67919 Unspecified disorder of synovium and tendon, unspecified shoulder: Secondary | ICD-10-CM | POA: Insufficient documentation

## 2013-10-02 DIAGNOSIS — Z7982 Long term (current) use of aspirin: Secondary | ICD-10-CM | POA: Insufficient documentation

## 2013-10-02 DIAGNOSIS — Z7902 Long term (current) use of antithrombotics/antiplatelets: Secondary | ICD-10-CM | POA: Insufficient documentation

## 2013-10-02 DIAGNOSIS — Z8701 Personal history of pneumonia (recurrent): Secondary | ICD-10-CM | POA: Insufficient documentation

## 2013-10-02 DIAGNOSIS — K922 Gastrointestinal hemorrhage, unspecified: Principal | ICD-10-CM | POA: Insufficient documentation

## 2013-10-02 DIAGNOSIS — H53149 Visual discomfort, unspecified: Secondary | ICD-10-CM | POA: Insufficient documentation

## 2013-10-02 DIAGNOSIS — M129 Arthropathy, unspecified: Secondary | ICD-10-CM | POA: Insufficient documentation

## 2013-10-02 DIAGNOSIS — B20 Human immunodeficiency virus [HIV] disease: Secondary | ICD-10-CM | POA: Diagnosis present

## 2013-10-02 DIAGNOSIS — Z79899 Other long term (current) drug therapy: Secondary | ICD-10-CM | POA: Insufficient documentation

## 2013-10-02 DIAGNOSIS — I2510 Atherosclerotic heart disease of native coronary artery without angina pectoris: Secondary | ICD-10-CM | POA: Insufficient documentation

## 2013-10-02 DIAGNOSIS — M542 Cervicalgia: Secondary | ICD-10-CM | POA: Insufficient documentation

## 2013-10-02 DIAGNOSIS — J4489 Other specified chronic obstructive pulmonary disease: Secondary | ICD-10-CM | POA: Insufficient documentation

## 2013-10-02 DIAGNOSIS — K921 Melena: Secondary | ICD-10-CM | POA: Insufficient documentation

## 2013-10-02 DIAGNOSIS — K819 Cholecystitis, unspecified: Secondary | ICD-10-CM | POA: Insufficient documentation

## 2013-10-02 DIAGNOSIS — I10 Essential (primary) hypertension: Secondary | ICD-10-CM

## 2013-10-02 DIAGNOSIS — G459 Transient cerebral ischemic attack, unspecified: Secondary | ICD-10-CM

## 2013-10-02 DIAGNOSIS — I5042 Chronic combined systolic (congestive) and diastolic (congestive) heart failure: Secondary | ICD-10-CM | POA: Diagnosis present

## 2013-10-02 DIAGNOSIS — E039 Hypothyroidism, unspecified: Secondary | ICD-10-CM | POA: Insufficient documentation

## 2013-10-02 DIAGNOSIS — Z87891 Personal history of nicotine dependence: Secondary | ICD-10-CM | POA: Insufficient documentation

## 2013-10-02 DIAGNOSIS — Z9861 Coronary angioplasty status: Secondary | ICD-10-CM | POA: Insufficient documentation

## 2013-10-02 DIAGNOSIS — R4789 Other speech disturbances: Secondary | ICD-10-CM | POA: Insufficient documentation

## 2013-10-02 DIAGNOSIS — J449 Chronic obstructive pulmonary disease, unspecified: Secondary | ICD-10-CM | POA: Insufficient documentation

## 2013-10-02 DIAGNOSIS — G4733 Obstructive sleep apnea (adult) (pediatric): Secondary | ICD-10-CM | POA: Diagnosis present

## 2013-10-02 DIAGNOSIS — Z8673 Personal history of transient ischemic attack (TIA), and cerebral infarction without residual deficits: Secondary | ICD-10-CM | POA: Insufficient documentation

## 2013-10-02 DIAGNOSIS — I209 Angina pectoris, unspecified: Secondary | ICD-10-CM | POA: Insufficient documentation

## 2013-10-02 DIAGNOSIS — Z8619 Personal history of other infectious and parasitic diseases: Secondary | ICD-10-CM | POA: Insufficient documentation

## 2013-10-02 DIAGNOSIS — D509 Iron deficiency anemia, unspecified: Secondary | ICD-10-CM | POA: Insufficient documentation

## 2013-10-02 DIAGNOSIS — Z21 Asymptomatic human immunodeficiency virus [HIV] infection status: Secondary | ICD-10-CM | POA: Insufficient documentation

## 2013-10-02 DIAGNOSIS — G8929 Other chronic pain: Secondary | ICD-10-CM | POA: Insufficient documentation

## 2013-10-02 DIAGNOSIS — Z888 Allergy status to other drugs, medicaments and biological substances status: Secondary | ICD-10-CM | POA: Insufficient documentation

## 2013-10-02 DIAGNOSIS — E785 Hyperlipidemia, unspecified: Secondary | ICD-10-CM | POA: Insufficient documentation

## 2013-10-02 LAB — RAPID URINE DRUG SCREEN, HOSP PERFORMED
Amphetamines: NOT DETECTED
Barbiturates: NOT DETECTED
Benzodiazepines: NOT DETECTED
Cocaine: NOT DETECTED
Opiates: NOT DETECTED
Tetrahydrocannabinol: NOT DETECTED

## 2013-10-02 LAB — URINE MICROSCOPIC-ADD ON

## 2013-10-02 LAB — URINALYSIS, ROUTINE W REFLEX MICROSCOPIC
Bilirubin Urine: NEGATIVE
GLUCOSE, UA: NEGATIVE mg/dL
HGB URINE DIPSTICK: NEGATIVE
Ketones, ur: NEGATIVE mg/dL
Nitrite: NEGATIVE
Protein, ur: NEGATIVE mg/dL
SPECIFIC GRAVITY, URINE: 1.016 (ref 1.005–1.030)
Urobilinogen, UA: 1 mg/dL (ref 0.0–1.0)
pH: 7.5 (ref 5.0–8.0)

## 2013-10-02 LAB — CBC WITH DIFFERENTIAL/PLATELET
BASOS PCT: 1 % (ref 0–1)
Basophils Absolute: 0 10*3/uL (ref 0.0–0.1)
EOS PCT: 5 % (ref 0–5)
Eosinophils Absolute: 0.2 10*3/uL (ref 0.0–0.7)
HEMATOCRIT: 34.1 % — AB (ref 36.0–46.0)
HEMOGLOBIN: 10.9 g/dL — AB (ref 12.0–15.0)
LYMPHS PCT: 38 % (ref 12–46)
Lymphs Abs: 1.8 10*3/uL (ref 0.7–4.0)
MCH: 25.4 pg — ABNORMAL LOW (ref 26.0–34.0)
MCHC: 32 g/dL (ref 30.0–36.0)
MCV: 79.5 fL (ref 78.0–100.0)
MONO ABS: 0.5 10*3/uL (ref 0.1–1.0)
Monocytes Relative: 10 % (ref 3–12)
NEUTROS ABS: 2.3 10*3/uL (ref 1.7–7.7)
Neutrophils Relative %: 47 % (ref 43–77)
Platelets: 257 10*3/uL (ref 150–400)
RBC: 4.29 MIL/uL (ref 3.87–5.11)
RDW: 14.8 % (ref 11.5–15.5)
WBC: 4.8 10*3/uL (ref 4.0–10.5)

## 2013-10-02 LAB — COMPREHENSIVE METABOLIC PANEL
ALT: 12 U/L (ref 0–35)
AST: 20 U/L (ref 0–37)
Albumin: 3.2 g/dL — ABNORMAL LOW (ref 3.5–5.2)
Alkaline Phosphatase: 55 U/L (ref 39–117)
BILIRUBIN TOTAL: 0.3 mg/dL (ref 0.3–1.2)
BUN: 11 mg/dL (ref 6–23)
CALCIUM: 9 mg/dL (ref 8.4–10.5)
CHLORIDE: 109 meq/L (ref 96–112)
CO2: 27 mEq/L (ref 19–32)
Creatinine, Ser: 0.92 mg/dL (ref 0.50–1.10)
GFR calc Af Amer: 82 mL/min — ABNORMAL LOW (ref >90)
GFR, EST NON AFRICAN AMERICAN: 70 mL/min — AB (ref >90)
Glucose, Bld: 81 mg/dL (ref 70–99)
Potassium: 3.8 mEq/L (ref 3.7–5.3)
Sodium: 146 mEq/L (ref 137–147)
Total Protein: 7.8 g/dL (ref 6.0–8.3)

## 2013-10-02 LAB — TROPONIN I

## 2013-10-02 LAB — OCCULT BLOOD, POC DEVICE: FECAL OCCULT BLD: POSITIVE — AB
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
## 2013-10-02 NOTE — ED Notes (Signed)
Attempted report x 2 

## 2013-10-02 NOTE — ED Provider Notes (Signed)
MRI is unchanged.  On my examination of the patient.  She reports, that she's been having crampy, abdominal pain, and bloody, bowel movements since Saturday.  Review of her labs shows that she is dropped one gram of hemoglobin since last evaluation on January 9.  She does take Plavix for the new diagnosis of stroke.  This is very concerning.  I will assess patient with her primary care physician's internal medicine.  Ephraim  Garald Balding, NP 10/02/13 2225
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
## 2013-10-02 NOTE — ED Notes (Signed)
Per EMS - pt coming from home. Hx of stroke, 2013 major, then 2 mini strokes this month had left sided deficits from that. Since Sunday having HA that worsened in past 6 hours. No new deficits. Having some photophobia. Family c/o speech abnormalities. EMS hasn't heard any. BP 130/96 HR 70. Rating pain at 4/10.

## 2013-10-02 NOTE — ED Notes (Signed)
Attempted report x1. 

## 2013-10-03 ENCOUNTER — Encounter (HOSPITAL_COMMUNITY): Payer: Self-pay | Admitting: *Deleted

## 2013-10-03 DIAGNOSIS — I10 Essential (primary) hypertension: Secondary | ICD-10-CM

## 2013-10-03 DIAGNOSIS — J449 Chronic obstructive pulmonary disease, unspecified: Secondary | ICD-10-CM

## 2013-10-03 DIAGNOSIS — D509 Iron deficiency anemia, unspecified: Secondary | ICD-10-CM

## 2013-10-03 DIAGNOSIS — I509 Heart failure, unspecified: Secondary | ICD-10-CM

## 2013-10-03 DIAGNOSIS — J4489 Other specified chronic obstructive pulmonary disease: Secondary | ICD-10-CM

## 2013-10-03 DIAGNOSIS — I5022 Chronic systolic (congestive) heart failure: Secondary | ICD-10-CM

## 2013-10-03 DIAGNOSIS — Z21 Asymptomatic human immunodeficiency virus [HIV] infection status: Secondary | ICD-10-CM

## 2013-10-03 DIAGNOSIS — I6350 Cerebral infarction due to unspecified occlusion or stenosis of unspecified cerebral artery: Secondary | ICD-10-CM

## 2013-10-03 DIAGNOSIS — G459 Transient cerebral ischemic attack, unspecified: Secondary | ICD-10-CM

## 2013-10-03 DIAGNOSIS — E785 Hyperlipidemia, unspecified: Secondary | ICD-10-CM

## 2013-10-03 DIAGNOSIS — I2510 Atherosclerotic heart disease of native coronary artery without angina pectoris: Secondary | ICD-10-CM

## 2013-10-03 DIAGNOSIS — G4733 Obstructive sleep apnea (adult) (pediatric): Secondary | ICD-10-CM

## 2013-10-03 DIAGNOSIS — K5730 Diverticulosis of large intestine without perforation or abscess without bleeding: Secondary | ICD-10-CM

## 2013-10-03 DIAGNOSIS — Z8673 Personal history of transient ischemic attack (TIA), and cerebral infarction without residual deficits: Secondary | ICD-10-CM

## 2013-10-03 DIAGNOSIS — K922 Gastrointestinal hemorrhage, unspecified: Principal | ICD-10-CM

## 2013-10-03 DIAGNOSIS — K644 Residual hemorrhoidal skin tags: Secondary | ICD-10-CM

## 2013-10-03 LAB — CBC
HCT: 31.7 % — ABNORMAL LOW (ref 36.0–46.0)
HEMATOCRIT: 33.7 % — AB (ref 36.0–46.0)
HEMOGLOBIN: 10.7 g/dL — AB (ref 12.0–15.0)
Hemoglobin: 9.9 g/dL — ABNORMAL LOW (ref 12.0–15.0)
MCH: 24.8 pg — ABNORMAL LOW (ref 26.0–34.0)
MCH: 25.3 pg — ABNORMAL LOW (ref 26.0–34.0)
MCHC: 31.2 g/dL (ref 30.0–36.0)
MCHC: 31.8 g/dL (ref 30.0–36.0)
MCV: 79.3 fL (ref 78.0–100.0)
MCV: 79.7 fL (ref 78.0–100.0)
PLATELETS: 243 10*3/uL (ref 150–400)
Platelets: 261 10*3/uL (ref 150–400)
RBC: 4 MIL/uL (ref 3.87–5.11)
RBC: 4.23 MIL/uL (ref 3.87–5.11)
RDW: 14.6 % (ref 11.5–15.5)
RDW: 14.7 % (ref 11.5–15.5)
WBC: 4.2 10*3/uL (ref 4.0–10.5)
WBC: 4 10*3/uL (ref 4.0–10.5)

## 2013-10-03 LAB — VITAMIN B12: Vitamin B-12: 458 pg/mL (ref 211–911)

## 2013-10-03 LAB — BASIC METABOLIC PANEL
BUN: 12 mg/dL (ref 6–23)
CALCIUM: 8.6 mg/dL (ref 8.4–10.5)
CO2: 25 mEq/L (ref 19–32)
CREATININE: 0.71 mg/dL (ref 0.50–1.10)
Chloride: 108 mEq/L (ref 96–112)
Glucose, Bld: 102 mg/dL — ABNORMAL HIGH (ref 70–99)
Potassium: 3.8 mEq/L (ref 3.7–5.3)
Sodium: 143 mEq/L (ref 137–147)

## 2013-10-03 LAB — FERRITIN: Ferritin: 37 ng/mL (ref 10–291)

## 2013-10-03 LAB — RETICULOCYTES
RBC.: 3.95 MIL/uL (ref 3.87–5.11)
RETIC COUNT ABSOLUTE: 47.4 10*3/uL (ref 19.0–186.0)
Retic Ct Pct: 1.2 % (ref 0.4–3.1)

## 2013-10-03 LAB — IRON AND TIBC
IRON: 19 ug/dL — AB (ref 42–135)
Saturation Ratios: 7 % — ABNORMAL LOW (ref 20–55)
TIBC: 286 ug/dL (ref 250–470)
UIBC: 267 ug/dL (ref 125–400)

## 2013-10-03 LAB — FOLATE: FOLATE: 8.5 ng/mL

## 2013-10-03 MED ORDER — CARVEDILOL 3.125 MG PO TABS
3.1250 mg | ORAL_TABLET | Freq: Two times a day (BID) | ORAL | Status: DC
  Administered 2013-10-03: 3.125 mg via ORAL
  Filled 2013-10-03 (×3): qty 1

## 2013-10-03 MED ORDER — NON FORMULARY: 40.0000 mg | Freq: Every day | Status: DC

## 2013-10-03 MED ORDER — ZOLPIDEM TARTRATE 5 MG PO TABS: 5.0000 mg | ORAL_TABLET | Freq: Every evening | ORAL | Status: DC | PRN

## 2013-10-03 MED ORDER — FERROUS SULFATE 325 (65 FE) MG PO TABS: 325.0000 mg | ORAL_TABLET | Freq: Three times a day (TID) | ORAL | Status: DC

## 2013-10-03 MED ORDER — SENNOSIDES-DOCUSATE SODIUM 8.6-50 MG PO TABS: 1.0000 | ORAL_TABLET | Freq: Every evening | ORAL | Status: DC | PRN

## 2013-10-03 MED ORDER — HYDROCORTISONE 2.5 % RE CREA
TOPICAL_CREAM | Freq: Two times a day (BID) | RECTAL | Status: DC
  Filled 2013-10-03: qty 28.35

## 2013-10-03 MED ORDER — SODIUM CHLORIDE 0.9 % IJ SOLN
3.0000 mL | Freq: Two times a day (BID) | INTRAMUSCULAR | Status: DC
  Administered 2013-10-03: 3 mL via INTRAVENOUS

## 2013-10-03 MED ORDER — FERROUS SULFATE 325 (65 FE) MG PO TABS
325.0000 mg | ORAL_TABLET | Freq: Three times a day (TID) | ORAL | Status: DC
  Administered 2013-10-03: 325 mg via ORAL
  Filled 2013-10-03 (×4): qty 1

## 2013-10-03 MED ORDER — OXYCODONE HCL 5 MG PO TABS
5.0000 mg | ORAL_TABLET | Freq: Four times a day (QID) | ORAL | Status: DC | PRN
  Administered 2013-10-03: 5 mg via ORAL
  Filled 2013-10-03: qty 1

## 2013-10-03 MED ORDER — SODIUM CHLORIDE 0.9 % IV SOLN: 250.0000 mL | INTRAVENOUS | Status: DC | PRN

## 2013-10-03 MED ORDER — ACETAMINOPHEN 500 MG PO TABS: 1000.0000 mg | ORAL_TABLET | Freq: Four times a day (QID) | ORAL | Status: DC | PRN

## 2013-10-03 MED ORDER — ELVITEG-COBIC-EMTRICIT-TENOFDF 150-150-200-300 MG PO TABS
1.0000 | ORAL_TABLET | Freq: Every day | ORAL | Status: DC
  Administered 2013-10-03: 1 via ORAL
  Filled 2013-10-03 (×2): qty 1

## 2013-10-03 MED ORDER — LEVALBUTEROL HCL 0.63 MG/3ML IN NEBU: 0.6300 mg | INHALATION_SOLUTION | Freq: Four times a day (QID) | RESPIRATORY_TRACT | Status: DC | PRN

## 2013-10-03 MED ORDER — OXYCODONE-ACETAMINOPHEN 5-325 MG PO TABS: 1.0000 | ORAL_TABLET | Freq: Four times a day (QID) | ORAL | Status: DC | PRN

## 2013-10-03 MED ORDER — SODIUM CHLORIDE 0.9 % IJ SOLN: 3.0000 mL | Freq: Two times a day (BID) | INTRAMUSCULAR | Status: DC

## 2013-10-03 MED ORDER — ROSUVASTATIN CALCIUM 40 MG PO TABS
40.0000 mg | ORAL_TABLET | Freq: Every day | ORAL | Status: DC
  Filled 2013-10-03: qty 1

## 2013-10-03 MED ORDER — SODIUM CHLORIDE 0.9 % IJ SOLN: 3.0000 mL | INTRAMUSCULAR | Status: DC | PRN

## 2013-10-03 MED ORDER — NAPROXEN 500 MG PO TABS: 500.0000 mg | ORAL_TABLET | Freq: Two times a day (BID) | ORAL | Status: DC | PRN

## 2013-10-03 MED ORDER — GABAPENTIN 400 MG PO CAPS
400.0000 mg | ORAL_CAPSULE | Freq: Three times a day (TID) | ORAL | Status: DC
  Administered 2013-10-03 (×2): 400 mg via ORAL
  Filled 2013-10-03 (×4): qty 1

## 2013-10-03 MED ORDER — OXYCODONE-ACETAMINOPHEN 10-325 MG PO TABS: 1.0000 | ORAL_TABLET | Freq: Four times a day (QID) | ORAL | Status: DC | PRN

## 2013-10-03 MED ORDER — PANTOPRAZOLE SODIUM 20 MG PO TBEC
20.0000 mg | DELAYED_RELEASE_TABLET | Freq: Every day | ORAL | Status: DC
  Administered 2013-10-03: 20 mg via ORAL
  Filled 2013-10-03: qty 1

## 2013-10-03 NOTE — Progress Notes (Signed)
Pt admitted from the ed to 3w10, pt is alert x4, vital signs are stable, admission hx and assessment done. Will continue to monitor pt------Stefania Goulart, rn

## 2013-10-03 NOTE — Consult Note (Signed)
Referring Physician: Dr. Murlean Caller    Chief Complaint: Transient numbness involving left distal forearm and hand.  HPI: [name] is an 53 y.o. female with a history of right frontal strokes 2 weeks ago, hypertension, congestive heart failure, HIV infection, COPD and coronary artery, who was admitted for acute gastrointestinal, also complained of transient numbness involving her distal left forearm and left hand. Symptoms occurred around 4:30 on 10/02/2013. She was last known well at 12 noon and she went to bed to take a nap. Symptoms resolved after about 5 minutes. She had no facial numbness or left lower extremity symptoms. MRI of the brain tonight showed no acute changes. She has been taking aspirin and Plavix for antiplatelet therapy. Hemoglobin tonight was 10.9 and hematocrit was 34.1. Previous hemoglobin 09/14/2013 11.4.  LSN: 12:00 noon on 10/02/2013 tPA Given: No: Rapidly resolved deficit MRankin: 0  Past Medical History  Diagnosis Date  . Cholecystitis     Gall bladder removed   . Hypercholesterolemia   . Fibroids   . HIV (human immunodeficiency virus infection) 05/27/11    CD4 460, VL undetectable 10/12/12  . Pelvic pain in female 10/14/11  . PMB (postmenopausal bleeding)     Since 2010  . Increased BMI 05/27/11  . Anxiety   . Depression   . Hypertension   . H/O varicella   . History of measles, mumps, or rubella   . Blood transfusion 1995    "related to losing blood in urine during pregnancy"  . Hypothyroidism     Reports h/o hypothyroidism; not on any meds, TSH wnl over several years  . 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 Myocardial infarction 07/2004  . COPD (chronic obstructive pulmonary disease)     well controlled, only using albuterol inhaler occasionally   . Iron deficiency anemia   . Lower GI bleed 04/24/2012    from hemorrhoids. No recent colonoscopy   . CAD in native artery     s/p  stent and restenting of LAD. On plavix  . Anginal pain   . Pneumonia 1980's    "once"  . Chronic bronchitis     "get it q yr" (09/14/2013)  . Stroke 12/2011    R insular ischemic CVA ; residual "speech messes up when I get sick or real tired" (09/14/2013)  . Arthritis     "knees; left shoulder; right anklef/foot" (09/14/2013)  . Bursitis, shoulder     "right"  . Chronic lower back pain     Family History  Problem Relation Age of Onset  . Hypertension Mother   . Hyperlipidemia Mother   . Obesity Mother   . Heart disease Mother   . Hypertension Maternal Aunt   . Hyperlipidemia Maternal Aunt   . Obesity Maternal Aunt   . Stroke Maternal Aunt      Medications: I have reviewed the patient's current medications.  ROS: History obtained from the patient  General ROS: Has been feeling generally fatigued Psychological ROS: negative for - behavioral disorder, hallucinations, memory difficulties, mood swings or suicidal ideation Ophthalmic ROS: negative for - blurry vision, double vision, eye pain or loss of vision ENT ROS: negative for - epistaxis, nasal discharge, oral lesions, sore throat, tinnitus or vertigo Allergy and Immunology ROS: negative for - hives or itchy/watery eyes Hematological and Lymphatic ROS: negative for - bleeding problems, bruising or swollen lymph nodes Endocrine ROS: negative for - galactorrhea,  hair pattern changes, polydipsia/polyuria or temperature intolerance Respiratory ROS: negative for - cough, hemoptysis, shortness of breath or wheezing Cardiovascular ROS: negative for - chest pain, dyspnea on exertion, edema or irregular heartbeat Gastrointestinal ROS: Admitted for acute GI bleed Genito-Urinary ROS: negative for - dysuria, hematuria, incontinence or urinary frequency/urgency Musculoskeletal ROS: negative for - joint swelling or muscular weakness Neurological ROS: as noted in HPI Dermatological ROS: negative for rash and skin lesion changes  Physical  Examination: Blood pressure 124/82, pulse 75, temperature 97.9 F (36.6 C), temperature source Oral, resp. rate 20, height 5\' 2"  (1.575 m), weight 105.053 kg (231 lb 9.6 oz), SpO2 100.00%.  Neurologic Examination: Mental Status: Alert, oriented, thought content appropriate.  Speech fluent without evidence of aphasia. Able to follow commands without difficulty. Cranial Nerves: II-Visual fields were normal. III/IV/VI-Pupils were equal and reacted. Extraocular movements were full and conjugate.    V/VII-no facial numbness and no facial weakness. VIII-normal. X-normal speech and symmetrical palatal movement. Motor: 5/5 bilaterally with normal tone and bulk Sensory: Normal throughout. Deep Tendon Reflexes: Plus and symmetric. Plantars: Mute bilaterally Cerebellar: Normal finger-to-nose testing.   Dg Chest 2 View  10/02/2013   CLINICAL DATA:  Chest pain and cough  EXAM: CHEST  2 VIEW  COMPARISON:  07/14/2013  FINDINGS: Cardiac loop recorder noted over the heart. Mild cardiomegaly. Clear lungs. No pneumonia or edema. Negative for effusion pneumothorax. Trachea midline. Prior cholecystectomy evident. Coronary stents noted on the lateral view.  IMPRESSION: Cardiomegaly without acute process   Electronically Signed   By: Daryll Brod M.D.   On: 10/02/2013 19:31   Ct Head Wo Contrast  10/02/2013   CLINICAL DATA:  Headache  EXAM: CT HEAD WITHOUT CONTRAST  TECHNIQUE: Contiguous axial images were obtained from the base of the skull through the vertex without contrast.  COMPARISON:  09/14/2013  FINDINGS: Remote infarct in the right centrum semiovale and corona radiata deep white matter as before. No acute intracranial hemorrhage, definite acute infarct, mass lesion, midline shift, herniation, hydrocephalus, or extra-axial fluid collection. Normal gray-white matter differentiation. Remote right cerebellar infarct. Postop changes of the right globe. Mastoids and sinuses clear. No skull abnormality.   IMPRESSION: Stable areas of remote infarct as above. Stable exam. No acute finding by noncontrast CT   Electronically Signed   By: Daryll Brod M.D.   On: 10/02/2013 19:28   Mr Brain Wo Contrast  10/02/2013   CLINICAL DATA:  History of stroke 2013. Mini strokes past month. Left-sided deficits. Headaches past 2 days worsening over the past 6 hr. No new deficits. Family describes speech abnormalities.  EXAM: MRI HEAD WITHOUT CONTRAST  TECHNIQUE: Multiplanar, multiecho pulse sequences of the brain and surrounding structures were obtained without intravenous contrast.  COMPARISON:  10/02/2013 CT. Multiple prior MR is most recent 09/14/2013  FINDINGS: No acute infarct.  No intracranial hemorrhage.  Remote infarct posterior right frontal - peri operculum region. Remote right cerebellar infarct. The tiny posterior right frontal -parietal lobe infarcts noted on the 09/14/2013 exam are not appreciated on the current exam.  No intracranial mass lesion noted on this unenhanced exam.  No hydrocephalus.  Major intracranial vascular structures are patent.  Cervical medullary junction, pineal region, pituitary region and orbital structures unremarkable.  Minimal to mild paranasal sinus mucosal thickening.  IMPRESSION: No acute infarct.  Remote infarcts as noted above.   Electronically Signed   By: Chauncey Cruel M.D.   On: 10/02/2013 21:15    Assessment: 53 y.o. female with multiple risk factors  for stroke, as well a recent right frontal ischemic infarction, presenting with transient numbness involving his left upper extremity, in the setting of acute gastrointestinal hemorrhage. Symptoms are indicative of recurrent right subcortical ischemic event with TIA.  Stroke Risk Factors - family history, hyperlipidemia and hypertension  Plan: 1. Prophylactic therapy-None until she is cleared by gastrointestinal medicine specialist, unless okay otherwise by stroke team. 2. Risk factor modification 3. Telemetry  monitoring   C.R. Nicole Kindred, MD [facility] [phone]  10/03/2013, 12:21 AM

## 2013-10-03 NOTE — Progress Notes (Signed)
Placed patient on CPAP at [facility] at this time is 98%

## 2013-10-03 NOTE — ED Provider Notes (Signed)
Please see the initial evaluation of for the patient's original presentation.  Carmin Muskrat, MD 10/03/13 [phone]

## 2013-10-03 NOTE — ED Provider Notes (Signed)
This was a supervised encounter, though I was aware of the patient's course, history, evaluation throughout. Patient was at MRI during my attempt to evaluate her personally.   Carmin Muskrat, MD 10/03/13 [phone]

## 2013-10-03 NOTE — Discharge Instructions (Signed)
Please take all of your medications as prescribed.  CONTINUE taking PLAVIX daily but STOP taking ASPIRIN.  You should also STOP taking AMITIZA as this medication can cause headaches.  We are prescribing you a different stool softener/laxative called SENNA to use as needed instead.  We also sent in a new prescription for iron supplements since you can't get to the bottle you have at home.   Remember to drink plenty of fluids because dehydration can cause headaches too!  Don't forget to plug in your loop recorder device when you get home.  Also don't forget your follow-up appointment in clinic next week.  We will check in with you and recheck your blood counts at that time.     Bloody Stools Bloody stools means there is blood in your poop (stool). It is a sign that there is a problem somewhere in the digestive system. It is important for your doctor to find the cause of your bleeding, so the problem can be treated.  HOME CARE  Only take medicine as told by your doctor.  Eat foods with fiber (prunes, bran cereals).  Drink enough fluids to keep your pee (urine) clear or pale yellow.  Sit in warm water (sitz bath) for 10 to 15 minutes as told by your doctor.  Know how to take your medicines (enemas, suppositories) if advised by your doctor.  Watch for signs that you are getting better or getting worse. GET HELP RIGHT AWAY IF:   You are not getting better.  You start to get better but then get worse again.  You have new problems.  You have severe bleeding from the place where poop comes out (rectum) that does not stop.  You throw up (vomit) blood.  You feel weak or pass out (faint).  You have a fever. MAKE SURE YOU:   Understand these instructions.  Will watch your condition.  Will get help right away if you are not doing well or get worse. Document Released: 08/11/2009 Document Revised: 11/15/2011 Document Reviewed: 01/08/2011 [facility] Patient Information 2014 [place].  Tension Headache A tension headache is pain, pressure, or aching felt over the front and sides of the head. Tension headaches often come after stress, feeling worried (anxiety), or feeling sad or down for a while (depressed). HOME CARE  Only take medicine as told by your doctor.  Lie down in a dark, quiet room when you have a headache.  Keep a journal to find out if certain things bring on headaches. For example, write down:  What you eat and drink.  How much sleep you get.  Any change to your diet or medicines.  Relax by getting a massage or doing other relaxing activities.  Put ice or heat packs on the head and neck area as told by your doctor.  Lessen stress.  Sit up straight. Do not tighten (tense) your muscles.  Quit smoking if you smoke.  Lessen how much alcohol you drink.  Lessen how much caffeine you drink, or stop drinking caffeine.  Eat and exercise regularly.  Get enough sleep.  Avoid using too much pain medicine. GET HELP RIGHT AWAY IF:   Your headache becomes really bad.  You have a fever.  You have a stiff neck.  You have trouble seeing.  Your muscles are weak, or you lose muscle control.  You lose your balance or have trouble walking.  You feel like you will pass out (faint), or you pass out.  You have really bad  symptoms that are different than your first symptoms.  You have problems with the medicines given to you by your doctor.  Your medicines do not work.  Your headache feels different than the other headaches.  You feel sick to your stomach (nauseous) or throw up (vomit). MAKE SURE YOU:   Understand these instructions.  Will watch your condition.  Will get help right away if you are not doing well or get worse. Document Released: 11/17/2009 Document Revised: 11/15/2011 Document Reviewed: 08/13/2011 [facility] Patient Information 2014 [place], [place].

## 2013-10-03 NOTE — Discharge Summary (Signed)
  Date: 10/03/2013  Patient name: [name]  Medical record number: [id number]  Date of birth: 1961-05-26   This patient has been seen and the plan of care was discussed with the house staff. Please see their note for complete details. I concur with their findings and plan.  Dominic Pea, DO, [facility] 10/03/2013, 1:37 PM

## 2013-10-03 NOTE — Progress Notes (Signed)
Stroke Team Progress Note  HISTORY [name] is an 53 y.o. female with a history of right frontal strokes 2 weeks ago, hypertension, congestive heart failure, HIV infection, COPD and coronary artery, who was admitted for acute gastrointestinal, also complained of transient numbness involving her distal left forearm and left hand. Symptoms occurred around 4:30 on 10/02/2013. She was last known well at 12 noon and she went to bed to take a nap. Symptoms resolved after about 5 minutes. She had no facial numbness or left lower extremity symptoms. MRI of the brain tonight showed no acute changes. She has been taking aspirin and Plavix for antiplatelet therapy. Hemoglobin tonight was 10.9 and hematocrit was 34.1. Previous hemoglobin 09/14/2013 11.4. Patient was not administerd TPA secondary to rapidly resolved deficit. She was admitted for further evaluation and treatment.  SUBJECTIVE No family is at the bedside.  Overall she feels her condition is completely resolved.   OBJECTIVE Most recent Vital Signs: Filed Vitals:   10/03/13 0008 10/03/13 0010 10/03/13 0208 10/03/13 0600  BP: 124/82   115/56  Pulse: 75  79 72  Temp:    97.9 F (36.6 C)  TempSrc:    Oral  Resp:   20 20  Height:      Weight:  105.053 kg (231 lb 9.6 oz)    SpO2:   98% 96%   CBG (last 3)  No results found for this basename: GLUCAP,  in the last 72 hours  IV Fluid Intake:     MEDICATIONS  . carvedilol  3.125 mg Oral BID WC  . elvitegravir-cobicistat-emtricitabine-tenofovir  1 tablet Oral Q breakfast  . ferrous sulfate  325 mg Oral TID WC  . gabapentin  400 mg Oral TID  . pantoprazole  20 mg Oral Daily  . rosuvastatin  40 mg Oral q1800  . sodium chloride  3 mL Intravenous Q12H  . sodium chloride  3 mL Intravenous Q12H   PRN:  sodium chloride, acetaminophen, levalbuterol, oxyCODONE, oxyCODONE-acetaminophen, sodium chloride, zolpidem  Diet:  NPO  Activity:  Bedrest, Up with assistance DVT Prophylaxis:  SCDs    CLINICALLY SIGNIFICANT STUDIES Basic Metabolic Panel:  Recent Labs Lab 10/02/13 1846 10/03/13 0138  NA 146 143  K 3.8 3.8  CL 109 108  CO2 27 25  GLUCOSE 81 102*  BUN 11 12  CREATININE 0.92 0.71  CALCIUM 9.0 8.6   Liver Function Tests:  Recent Labs Lab 10/02/13 1846  AST 20  ALT 12  ALKPHOS 55  BILITOT 0.3  PROT 7.8  ALBUMIN 3.2*   CBC:  Recent Labs Lab 10/02/13 1846 10/03/13 0138  WBC 4.8 4.2  NEUTROABS 2.3  --   HGB 10.9* 9.9*  HCT 34.1* 31.7*  MCV 79.5 79.3  PLT 257 243   Coagulation: No results found for this basename: LABPROT, INR,  in the last 168 hours Cardiac Enzymes:  Recent Labs Lab 10/02/13 1846  TROPONINI <0.30   Urinalysis:  Recent Labs Lab 10/02/13 1845  COLORURINE YELLOW  LABSPEC 1.016  PHURINE 7.5  GLUCOSEU NEGATIVE  HGBUR NEGATIVE  BILIRUBINUR NEGATIVE  KETONESUR NEGATIVE  PROTEINUR NEGATIVE  UROBILINOGEN 1.0  NITRITE NEGATIVE  LEUKOCYTESUR SMALL*   Lipid Panel    Component Value Date/Time   CHOL 199 09/15/2013 0537   TRIG 75 09/15/2013 0537   HDL 63 09/15/2013 0537   CHOLHDL 3.2 09/15/2013 0537   VLDL 15 09/15/2013 0537   LDLCALC 121* 09/15/2013 0537   HgbA1C  Lab Results  Component Value Date  HGBA1C 5.9* 09/15/2013    Urine Drug Screen:     Component Value Date/Time   LABOPIA NONE DETECTED 10/02/2013 1845   LABOPIA NEG 06/22/2013 1615   COCAINSCRNUR NONE DETECTED 10/02/2013 1845   COCAINSCRNUR NEG 06/22/2013 1615   LABBENZ NONE DETECTED 10/02/2013 1845   LABBENZ NEG 06/22/2013 1615   LABBENZ NEGATIVE 05/23/2009 0542   AMPHETMU NONE DETECTED 10/02/2013 1845   AMPHETMU NEGATIVE 05/23/2009 0542   THCU NONE DETECTED 10/02/2013 1845   LABBARB NONE DETECTED 10/02/2013 1845   LABBARB NEG 06/22/2013 1615    Alcohol Level: No results found for this basename: ETH,  in the last 168 hours   CT of the brain  10/02/2013    Stable areas of remote infarct as above. Stable exam. No acute finding by noncontrast CT   MRI of the  brain  10/02/2013   No acute infarct.  Remote infarcts  CXR  10/02/2013   Cardiomegaly without acute process   Physical Exam   Obese middle aged african american lady not in distress.Awake alert. Afebrile. Head is nontraumatic. Neck is supple without bruit. Hearing is normal. Cardiac exam no murmur or gallop. Lungs are clear to auscultation. Distal pulses are well felt. Neurological Exam : Awake alert oriented x 3 normal speech and language. Mild left lower face asymmetry. Tongue midline. No drift. Mild diminished fine finger movements on left. Orbits right over left upper extremity. Mild left grip weak.. Normal sensation . Normal coordination. ASSESSMENT [name] is a 53 y.o. female presenting with transient numbness involving left distal forearm and hand. Imaging negative for acute infarct. Dx: transient worsening of neuro sx, no acute stroke diagnosis. On aspirin 81 mg orally every day and clopidogrel 75 mg orally every day prior to admission. Now on aspirin 81 mg orally every day and clopidogrel 75 mg orally every day for secondary stroke prevention. Patient with no resultant neuro symptoms present this am. No further stroke/TIA work up indicated.  Has loop recorder. Per EDP note - "9:30 PM = [employer] tech called and states that there were not abnormalities. "  Hospital day # 1  TREATMENT/PLAN  Continue clopidogrel 75 mg orally every day alone for secondary stroke prevention. Stop aspirin  Follow Hgb, if it continues to drop, will stop plavix as well.  Stay hydrated  No further stroke workup indicated. Cancel rest of scheduled workup. Ongoing risk factor control by Primary Care Physician Stroke Service will sign off. Please call should any needs arise.  Burnetta Sabin, MSN, RN, ANVP-BC, ANP-BC, [facility] Pager: [phone] 10/03/2013 8:27 AM  I have personally obtained a history, examined the patient, evaluated imaging results, and formulated the  assessment and plan of care. I agree with the above. Antony Contras, MD

## 2013-10-03 NOTE — Progress Notes (Signed)
Subjective: [name] is doing well this morning.  States that her headache is much improved and she has not had an recurrent weakness or numbness in her left arm or slurred speech.   No further BMs (since yesterday morning) but no blood on toilet paper when wiping.  States that the rectal bleeding she has been having in past week similar to previous episodes of hemorrhoidal bleeding.  Denies melena.    Objective: Vital signs in last 24 hours: Filed Vitals:   10/03/13 0008 10/03/13 0010 10/03/13 0208 10/03/13 0600  BP: 124/82   115/56  Pulse: 75  79 72  Temp:    97.9 F (36.6 C)  TempSrc:    Oral  Resp:   20 20  Height:      Weight:  231 lb 9.6 oz (105.053 kg)    SpO2:   98% 96%   PEX General: alert, cooperative, NAD, very talkative HEENT: NCAT, vision grossly intact, oropharynx clear and non-erythematous  Neck: supple, no lymphadenopathy Lungs: clear to ascultation bilaterally, normal work of respiration, no wheezes, rales, ronchi Heart: regular rate and rhythm, no murmurs, gallops, or rubs Abdomen: soft, non-tender, non-distended, normal bowel sounds Extremities: 2+ DP/PT pulses bilaterally, no cyanosis, clubbing, or edema Neurologic: alert & oriented X3, cranial nerves II-XII intact, strength grossly intact, sensation intact to light touch Rectal: per ED note, external hemorrhoids present but no blood  Lab Results: Basic Metabolic Panel:  Recent Labs Lab 10/02/13 1846 10/03/13 0138  NA 146 143  K 3.8 3.8  CL 109 108  CO2 27 25  GLUCOSE 81 102*  BUN 11 12  CREATININE 0.92 0.71  CALCIUM 9.0 8.6   Liver Function Tests:  Recent Labs Lab 10/02/13 1846  AST 20  ALT 12  ALKPHOS 55  BILITOT 0.3  PROT 7.8  ALBUMIN 3.2*   CBC:  Recent Labs Lab 10/02/13 1846 10/03/13 0138  WBC 4.8 4.2  NEUTROABS 2.3  --   HGB 10.9* 9.9*  HCT 34.1* 31.7*  MCV 79.5 79.3  PLT 257 243   Cardiac Enzymes:  Recent Labs Lab 10/02/13 1846  TROPONINI <0.30   Anemia  Panel:  Recent Labs Lab 10/03/13 0138  RETICCTPCT 1.2   Urine Drug Screen: Drugs of Abuse     Component Value Date/Time   LABOPIA NONE DETECTED 10/02/2013 1845   LABOPIA NEG 06/22/2013 1615   COCAINSCRNUR NONE DETECTED 10/02/2013 1845   COCAINSCRNUR NEG 06/22/2013 1615   LABBENZ NONE DETECTED 10/02/2013 1845   LABBENZ NEG 06/22/2013 1615   LABBENZ NEGATIVE 05/23/2009 0542   AMPHETMU NONE DETECTED 10/02/2013 1845   AMPHETMU NEGATIVE 05/23/2009 0542   THCU NONE DETECTED 10/02/2013 1845   LABBARB NONE DETECTED 10/02/2013 1845   LABBARB NEG 06/22/2013 1615    Urinalysis:  Recent Labs Lab 10/02/13 1845  COLORURINE YELLOW  LABSPEC 1.016  PHURINE 7.5  GLUCOSEU NEGATIVE  HGBUR NEGATIVE  BILIRUBINUR NEGATIVE  KETONESUR NEGATIVE  PROTEINUR NEGATIVE  UROBILINOGEN 1.0  NITRITE NEGATIVE  LEUKOCYTESUR SMALL*   Studies/Results: Dg Chest 2 View  10/02/2013   CLINICAL DATA:  Chest pain and cough  EXAM: CHEST  2 VIEW  COMPARISON:  07/14/2013  FINDINGS: Cardiac loop recorder noted over the heart. Mild cardiomegaly. Clear lungs. No pneumonia or edema. Negative for effusion pneumothorax. Trachea midline. Prior cholecystectomy evident. Coronary stents noted on the lateral view.  IMPRESSION: Cardiomegaly without acute process   Electronically Signed   By: Daryll Brod M.D.   On: 10/02/2013 19:31  Ct Head Wo Contrast  10/02/2013   CLINICAL DATA:  Headache  EXAM: CT HEAD WITHOUT CONTRAST  TECHNIQUE: Contiguous axial images were obtained from the base of the skull through the vertex without contrast.  COMPARISON:  09/14/2013  FINDINGS: Remote infarct in the right centrum semiovale and corona radiata deep white matter as before. No acute intracranial hemorrhage, definite acute infarct, mass lesion, midline shift, herniation, hydrocephalus, or extra-axial fluid collection. Normal gray-white matter differentiation. Remote right cerebellar infarct. Postop changes of the right globe. Mastoids and sinuses  clear. No skull abnormality.  IMPRESSION: Stable areas of remote infarct as above. Stable exam. No acute finding by noncontrast CT   Electronically Signed   By: Daryll Brod M.D.   On: 10/02/2013 19:28   Mr Brain Wo Contrast  10/02/2013   CLINICAL DATA:  History of stroke 2013. Mini strokes past month. Left-sided deficits. Headaches past 2 days worsening over the past 6 hr. No new deficits. Family describes speech abnormalities.  EXAM: MRI HEAD WITHOUT CONTRAST  TECHNIQUE: Multiplanar, multiecho pulse sequences of the brain and surrounding structures were obtained without intravenous contrast.  COMPARISON:  10/02/2013 CT. Multiple prior MR is most recent 09/14/2013  FINDINGS: No acute infarct.  No intracranial hemorrhage.  Remote infarct posterior right frontal - peri operculum region. Remote right cerebellar infarct. The tiny posterior right frontal -parietal lobe infarcts noted on the 09/14/2013 exam are not appreciated on the current exam.  No intracranial mass lesion noted on this unenhanced exam.  No hydrocephalus.  Major intracranial vascular structures are patent.  Cervical medullary junction, pineal region, pituitary region and orbital structures unremarkable.  Minimal to mild paranasal sinus mucosal thickening.  IMPRESSION: No acute infarct.  Remote infarcts as noted above.   Electronically Signed   By: Chauncey Cruel M.D.   On: 10/02/2013 21:15   Medications: I have reviewed the patient's current medications. Scheduled Meds: . carvedilol  3.125 mg Oral BID WC  . elvitegravir-cobicistat-emtricitabine-tenofovir  1 tablet Oral Q breakfast  . ferrous sulfate  325 mg Oral TID WC  . gabapentin  400 mg Oral TID  . pantoprazole  20 mg Oral Daily  . rosuvastatin  40 mg Oral q1800  . sodium chloride  3 mL Intravenous Q12H  . sodium chloride  3 mL Intravenous Q12H   Continuous Infusions:  PRN Meds:.sodium chloride, acetaminophen, levalbuterol, oxyCODONE, oxyCODONE-acetaminophen, sodium chloride,  zolpidem Assessment/Plan: #Mild GI Bleed- Patient complaining of bloody bowel movements x 1 week (sometimes "blood floating in toilet bowl," sometimes blood on toilet paper after wiping) similar to previous episodes of hemorrhoidal bleeding.  She has history of PUD and hemorrhoids.  Last colonoscopy in 01/2013 showed few medium-mouthed diverticula in sigmoid and descending colon, rest of exam normal.  No EGD on record.  She has been taking ASA and Plavix as secondary prophylaxis for stroke.  Further, she has had increased NSAID use due to recent headaches.  Her Hgb is near baseline though decreasing slightly: 10.9 on admission (11.4 at discharge on 09/15/13), 9.9 today.  Thus patient may have a slow GI bleed 2/2 diverticuli vs. hemorrhoids.  She is hemodynamically stable.  This situation is a risk vs. benefit analysis as anticoagulation will make her more likely to bleed but help prevent further strokes.  -repeat CBC at 10a (last at 1a), discharge pending stable Hgb  -ASA, Plavix being held per neuro recs overnight -Anusol to apply to rectal area -follow-up anemia panel; will give patient new prescription for iron supplementation at discharge  as she can not get to her bottle at home  #Left hand numbness and slurred speech (now resolved), headache, possbily due toTIA- CT head and MRI brain yesterday stable and negative for acute infarct.  Patient has loop recorder in place due to concern for embolic strokes in past.   Again, patient has documented history of PFO but most recent TEE (12/2011) showed no evidence of PFO.   -neurology consult, appreciate recs; will discuss case (including anticogulation regimen at discharge) with stroke team this AM, patient on ASA and Plavix prior to admission -[employer] rep interrogated loop recorder yesterday in ED, no events recorded; per Dr. Rayann Heman, ok to discontinue ASA at discharge given stent placed > 1 year ago (in 2008)  -discontinue Amitiza at discharge as this can  cause headache (which is quite frightening to patient given headache with previous strokes); rx for Senna prn at discharge, patient feels that constipation is much improved since changing her diet in past few months -close outpatient follow-up arranged  ADDENDUM: Neurology feels patient's symptoms unlikely to be due to TIA.  Recommend continuing Plavix, discontinuing ASA at discharge with PCP follow-up in one week and repeat CBC at that time.  If Hgb dropping, Plavix may have to be discontinued.   #HTN- BP stable.  Continue home Coreg 3.125 mg BID.  Will restart other home antihypertensives at discharge.   #CAD- Continue home statin.  Holding ASA per neuro recs and with Dr. Jackalyn Lombard (cardiology) permission.    #COPD- Appears stable, continue home Albuterol.   #Systolic CHF (EF [id number])- Appears stable. CXR showed cardiomegaly with no acute process.  No shortness of breath or crackles on exam.  Continue home Coreg, held lisinopril at admission due to concern for TIA but will restart at discharge.   #OSA- stable, continue CPAP.  #GERD- stable, continue PPI.   #HIV- stable, continue home meds.   Dispo: Disposition is deferred at this time, awaiting improvement of current medical problems.  Anticipated discharge today.   The patient does have a current PCP Joni Reining, DO) and does need an [facility] hospital follow-up appointment after discharge.   .Services Needed at time of discharge: Y = Yes, Blank = No PT:   OT:   RN:   Equipment:   Other:     LOS: 1 day   Ivin Poot, MD 10/03/2013, 7:25 AM

## 2013-10-03 NOTE — H&P (Signed)
INTERNAL MEDICINE TEACHING SERVICE Attending Admission Note  Date: 10/03/2013  Patient name: [name]  Medical record number: [id number]  Date of birth: Oct 16, 1960    I have seen and evaluated Bing Quarry and discussed their care with the Residency Team.  53 yr old female with pmhx significant for HTN, CVA, PUD, HIV, CAD with stent placement, CHF, presented with HA maroon colored stools. The patient was discharged recently after a CVA with plavix and ASA. She had a loop recorder place don 1/13 due to concern for P-Afib. She admits to recent NSAID use for her HA's. There was a concern for left hand numbness and slurred speech, but this has resolved. This morning, she denies any further episodes of maroon colored stools. She denies dizziness, CP, SOB, palpitations. She also denies melena. On exam, CV: S1, S2, no m/r/g, RRR PULM: CTA bilat. UE/LE: 2/4 pulses, no c/c/e. Neuro: CN II-XII intact, no focal deficits.  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 This is not an UGI bleed. There is no evidence of continued bleeding, in fact, her H/H has increased from last night 9.9/31--> 10.7/33.At this time, neurology agrees to stop ASA. There is no evidence of repeat CVA or a TIA. Cardiology also agrees with stopping ASA for now. Plavix will be continued. Loop recorder has been interrogated, no evidence of events. She will need outpatient followup. Dr. Rayann Heman has been informed and he will follow up with her. At this time, would hold HCTZ given her marginal low BPs.  She is hemodynamically stable. Medically stable to D/C.  Dominic Pea, DO, [facility] 10/03/2013, 1:36 PM

## 2013-10-03 NOTE — Discharge Summary (Signed)
Name: [name] MRN: [id number] DOB: 10-06-1960 53 y.o. PCP: Joni Reining, DO  Date of Admission: 10/02/2013  5:50 PM Date of Discharge: 10/03/2013 Attending Physician: Dominic Pea, DO  Discharge Diagnosis: Principal Problem:   GI bleed Active Problems:   HUMAN IMMUNODEFICIENCY VIRUS [HIV]   HYPERLIPIDEMIA   Anemia, iron deficiency   HYPERTENSION   CAD   EXTERNAL HEMORRHOIDS WITHOUT MENTION COMP   COPD, severity to be determined   History of ischemic stroke   Chronic systolic CHF (congestive heart failure)   OSA (obstructive sleep apnea)  Discharge Medications:   Medication List    STOP taking these medications       aspirin 81 MG EC tablet     lubiprostone 24 MCG capsule  Commonly known as:  AMITIZA      TAKE these medications       acetaminophen 500 MG tablet  Commonly known as:  TYLENOL  Take 1,000 mg by mouth every 6 (six) hours as needed for mild pain.     albuterol 108 (90 BASE) MCG/ACT inhaler  Commonly known as:  PROVENTIL HFA;VENTOLIN HFA  Inhale 2 puffs into the lungs every 6 (six) hours as needed. For wheezing and shortness of breath     carvedilol 3.125 MG tablet  Commonly known as:  COREG  Take 1 tablet (3.125 mg total) by mouth 2 (two) times daily with a meal. 9am and 6pm     clopidogrel 75 MG tablet  Commonly known as:  PLAVIX  Take 1 tablet (75 mg total) by mouth daily.     diphenoxylate-atropine 2.5-0.025 MG per tablet  Commonly known as:  LOMOTIL  Take 1 tablet by mouth 4 (four) times daily as needed. For loose stool     ferrous sulfate 325 (65 FE) MG tablet  Take 1 tablet (325 mg total) by mouth 3 (three) times daily with meals.     gabapentin 400 MG capsule  Commonly known as:  NEURONTIN  Take 1 capsule (400 mg total) by mouth 3 (three) times daily.     hydrochlorothiazide 25 MG tablet  Commonly known as:  HYDRODIURIL  Take 1 tablet (25 mg total) by mouth daily.     isosorbide mononitrate 60 MG 24 hr tablet    Commonly known as:  IMDUR  Take 1 tablet (60 mg total) by mouth every evening. 5pm     lisinopril 5 MG tablet  Commonly known as:  PRINIVIL,ZESTRIL  Take 1 tablet (5 mg total) by mouth daily.     multivitamin with minerals Tabs tablet  Take 1 tablet by mouth daily.     naproxen 500 MG tablet  Commonly known as:  NAPROSYN  Take 1 tablet (500 mg total) by mouth 2 (two) times daily as needed.     nitroGLYCERIN 0.3 MG SL tablet  Commonly known as:  NITROSTAT  Place 0.3 mg under the tongue every 5 (five) minutes as needed. For chest pain     oxyCODONE-acetaminophen 10-325 MG per tablet  Commonly known as:  PERCOCET  Take 1 tablet by mouth every 6 (six) hours as needed for pain.     pantoprazole 20 MG tablet  Commonly known as:  PROTONIX  Take 20 mg by mouth daily.     rosuvastatin 40 MG tablet  Commonly known as:  CRESTOR  Take 1 tablet (40 mg total) by mouth daily at 6 PM.     senna-docusate 8.6-50 MG per tablet  Commonly known as:  Senokot-S  Take 1 tablet by mouth at bedtime as needed for mild constipation.     STRIBILD 150-150-200-300 MG Tabs tablet  Generic drug:  elvitegravir-cobicistat-emtricitabine-tenofovir  Take 1 tablet by mouth daily with breakfast.     traZODone 50 MG tablet  Commonly known as:  DESYREL  Take 50 mg by mouth at bedtime.     valACYclovir 500 MG tablet  Commonly known as:  VALTREX  Take 500 mg by mouth 2 (two) times daily as needed (flare ups).     zolpidem 5 MG tablet  Commonly known as:  AMBIEN  Take 1 tablet (5 mg total) by mouth at bedtime as needed for sleep.        Disposition and follow-up:   [name] was discharged from [facility] in Stable condition.  At the hospital follow up visit please address:  1.  Recurrent headaches?  2.  Recurrent rectal bleeding?  3.  Compliance with Plavix (ASA discontinued at discharge)  4.  BP control, need to restart HCTZ?  5.  Labs / imaging needed at time of  follow-up: CBC  6.  Pending labs/ test needing follow-up: anemia panel    Follow-up Appointments: Follow-up Information   Follow up with Ivin Poot, MD On 10/09/2013. (2:15p)    Specialty:  Internal Medicine   Contact information:   [place] [address] [phone]       Discharge Instructions:  Future Appointments Provider Department Dept Phone   10/09/2013 2:15 PM Ivin Poot, MD [place] [phone]   10/18/2013 2:00 PM [facility] for Infectious Disease [phone]   10/18/2013 4:00 PM Philmore Pali, NP [facility] [phone]   11/01/2013 2:30 PM Thayer Headings, MD [facility] for Infectious Disease [phone]   11/08/2013 3:30 PM Joni Reining, DO [facility] [place] [phone]      Consultations:  neurology  Procedures Performed:  Dg Chest 2 View  10/02/2013   CLINICAL DATA:  Chest pain and cough  EXAM: CHEST  2 VIEW  COMPARISON:  07/14/2013  FINDINGS: Cardiac loop recorder noted over the heart. Mild cardiomegaly. Clear lungs. No pneumonia or edema. Negative for effusion pneumothorax. Trachea midline. Prior cholecystectomy evident. Coronary stents noted on the lateral view.  IMPRESSION: Cardiomegaly without acute process   Electronically Signed   By: Daryll Brod M.D.   On: 10/02/2013 19:31   Ct Head Wo Contrast  10/02/2013   CLINICAL DATA:  Headache  EXAM: CT HEAD WITHOUT CONTRAST  TECHNIQUE: Contiguous axial images were obtained from the base of the skull through the vertex without contrast.  COMPARISON:  09/14/2013  FINDINGS: Remote infarct in the right centrum semiovale and corona radiata deep white matter as before. No acute intracranial hemorrhage, definite acute infarct, mass lesion, midline shift, herniation, hydrocephalus, or extra-axial fluid collection. Normal gray-white matter differentiation. Remote right cerebellar infarct. Postop  changes of the right globe. Mastoids and sinuses clear. No skull abnormality.  IMPRESSION: Stable areas of remote infarct as above. Stable exam. No acute finding by noncontrast CT   Electronically Signed   By: Daryll Brod M.D.   On: 10/02/2013 19:28   Ct Head Wo Contrast  09/14/2013   CLINICAL DATA:  Code stroke.  Numbness and weakness left arm  EXAM: CT HEAD WITHOUT CONTRAST  TECHNIQUE: Contiguous axial images were obtained from the base of the skull through the vertex without intravenous contrast.  COMPARISON:  CT 09/14/2011  FINDINGS: Chronic infarct right cerebellum. Hypodensity in the deep white matter on the right is unchanged from the MRI of 06/13/2012 and most consistent with chronic microvascular ischemia.  Negative for acute infarct.  Negative for hemorrhage or mass.  IMPRESSION: Chronic ischemic changes are stable.  No acute abnormality.  Critical Value/emergent results were called by telephone at the time of interpretation on 09/14/2013 at 9:07 AM to Dr. Wallie Char, who verbally acknowledged these results.   Electronically Signed   By: Franchot Gallo M.D.   On: 09/14/2013 09:08   Mr Brain Wo Contrast  10/02/2013   CLINICAL DATA:  History of stroke 2013. Mini strokes past month. Left-sided deficits. Headaches past 2 days worsening over the past 6 hr. No new deficits. Family describes speech abnormalities.  EXAM: MRI HEAD WITHOUT CONTRAST  TECHNIQUE: Multiplanar, multiecho pulse sequences of the brain and surrounding structures were obtained without intravenous contrast.  COMPARISON:  10/02/2013 CT. Multiple prior MR is most recent 09/14/2013  FINDINGS: No acute infarct.  No intracranial hemorrhage.  Remote infarct posterior right frontal - peri operculum region. Remote right cerebellar infarct. The tiny posterior right frontal -parietal lobe infarcts noted on the 09/14/2013 exam are not appreciated on the current exam.  No intracranial mass lesion noted on this unenhanced exam.  No  hydrocephalus.  Major intracranial vascular structures are patent.  Cervical medullary junction, pineal region, pituitary region and orbital structures unremarkable.  Minimal to mild paranasal sinus mucosal thickening.  IMPRESSION: No acute infarct.  Remote infarcts as noted above.   Electronically Signed   By: Chauncey Cruel M.D.   On: 10/02/2013 21:15   Mr Brain Wo Contrast  09/14/2013   CLINICAL DATA:  Stroke. History of stroke in 2013. This morning began feeling weak and numb in the left arm. Mild word-finding difficulty.  EXAM: MRI HEAD WITHOUT CONTRAST  MRA HEAD WITHOUT CONTRAST  TECHNIQUE: Multiplanar, multiecho pulse sequences of the brain and surrounding structures were obtained without intravenous contrast. Angiographic images of the head were obtained using MRA technique without contrast.  COMPARISON:  Head CT 09/14/2013.  Brain MRI and MRA 06/13/2012.  FINDINGS: MRI HEAD FINDINGS  Images are mildly degraded by motion artifact. There are 2 punctate foci of cortical restricted diffusion in the right frontal and right parietal lobes. There is no intracranial hemorrhage, mass, midline shift, or extra-axial fluid collection. Old infarcts are noted in the right centrum semiovale/ corona radiata and right cerebellum, unchanged. Ventricles and sulci are within normal limits. Major intracranial vascular flow voids are unremarkable. Prior right scleral buckle is noted. Paranasal sinuses and mastoid air cells are clear.  MRA HEAD FINDINGS  Visualized distal vertebral arteries are patent and codominant. PICA origins are patent bilaterally. Basilar artery is patent without stenosis. Right SCA origin is patent. Left SCA is not identified. PCA origins and visualized branches are patent and unremarkable. Bilateral posterior communicating arteries are present. Internal carotid arteries are patent from skullbase to carotid terminus. ACA and MCA origins and visualized branches are unremarkable. No intracranial aneurysm is  identified.  IMPRESSION: 1. Two punctate foci of acute infarct in the right frontal and right parietal lobes. 2. No evidence of major intracranial arterial occlusion or flow-limiting stenosis.   Electronically Signed   By: Logan Bores   On: 09/14/2013 17:58   Mr [name] Head/brain Wo Cm  09/14/2013   CLINICAL DATA:  Stroke. History of stroke in 2013. This morning began feeling weak and numb in the left arm.  Mild word-finding difficulty.  EXAM: MRI HEAD WITHOUT CONTRAST  MRA HEAD WITHOUT CONTRAST  TECHNIQUE: Multiplanar, multiecho pulse sequences of the brain and surrounding structures were obtained without intravenous contrast. Angiographic images of the head were obtained using MRA technique without contrast.  COMPARISON:  Head CT 09/14/2013.  Brain MRI and MRA 06/13/2012.  FINDINGS: MRI HEAD FINDINGS  Images are mildly degraded by motion artifact. There are 2 punctate foci of cortical restricted diffusion in the right frontal and right parietal lobes. There is no intracranial hemorrhage, mass, midline shift, or extra-axial fluid collection. Old infarcts are noted in the right centrum semiovale/ corona radiata and right cerebellum, unchanged. Ventricles and sulci are within normal limits. Major intracranial vascular flow voids are unremarkable. Prior right scleral buckle is noted. Paranasal sinuses and mastoid air cells are clear.  MRA HEAD FINDINGS  Visualized distal vertebral arteries are patent and codominant. PICA origins are patent bilaterally. Basilar artery is patent without stenosis. Right SCA origin is patent. Left SCA is not identified. PCA origins and visualized branches are patent and unremarkable. Bilateral posterior communicating arteries are present. Internal carotid arteries are patent from skullbase to carotid terminus. ACA and MCA origins and visualized branches are unremarkable. No intracranial aneurysm is identified.  IMPRESSION: 1. Two punctate foci of acute infarct in the right frontal and  right parietal lobes. 2. No evidence of major intracranial arterial occlusion or flow-limiting stenosis.   Electronically Signed   By: Logan Bores   On: 09/14/2013 17:58    Admission HPI:  [name] is a 52 y.o. woman w/ pmhx of CVA, HIV, HTN, HLD, CAD s/p stent, CHF who presents to ED with a CC of headache. The patient was recently discharged on 09/15/12 after being diagnosed with a CVA. Of note, the patient was sent home on plavix and ASA. The patient had been non-compliant with plavix for 1 week prior to CVA. Furthermore, a loop recorder was placed by EP on 1/13. The patient was in her normal state of health one week ago when she began having daily bloody bowel movements. The patient has had previous BBM thought to be 2/2 to NSAID use ans PUD. Patient admits to mild epigastric and lower abdominal discomfort. Over the last week the patient reports increasing headache,weakness, and fatigue. The headache is on top to the head and radiates down the left side of her neck. It has been present for the last 4 days. The HA has been improved with multiple doses of naproxen and advil. She also reports an associated symptom of poor sleep during this time. The patient developed acute left hand numbness and slurred speech earlier in the day of this admission which led her to coming to the ED. The left hand numbness and slurred speech have resolved at this time.    Hospital Course by problem list: 1. Mild GI Bleed- Patient complaining of bloody bowel movements x 1 week (sometimes "blood floating in toilet bowl," sometimes blood on toilet paper after wiping) similar to previous episodes of hemorrhoidal bleeding, though this was not why she came to ED.  She has history of PUD and hemorrhoids.  Last colonoscopy in 01/2013 showed few medium-mouthed diverticula in sigmoid and descending colon, rest of exam normal.  No EGD on record.  She has been taking ASA and Plavix as secondary prophylaxis for stroke. Further,  she has had increased NSAID use due to recent headaches.  Baseline Hgb in 10-11 (11.4 at discharge on 09/15/13), Hgb 10.9 on admission -- >  9.9 --> 10.7.  Source of bleeding likely due to hemorrhoids vs. Slow diverticular bleed; she is hemodynamically stable. This situation is a risk vs. benefit analysis as anticoagulation will make her more likely to bleed but help prevent further strokes.  ASA, Plavix were held on night prior to discharge per neuro recs, continued Plavix but held ASA at discharge per neuro recs and with permission from cardiology.  Ordered Anusol for patient to apply to rectal area.  [facility] follow-up arranged next week, repeat CBC at that time.  Note: gave patient new rx for iron supplementation at discharge since she can not get to her bottle at home.   2. Left hand numbness and slurred speech (now resolved), headache, possbily due toTIA- Patient presented to ED with complaint of headache and transient left arm numbness, slurred speech.  CT head and MRI brain yesterday stable and negative for acute infarct.  Patient has loop recorder in place since earlier this month due to concern for embolic strokes in past.  [employer] rep interrogated loop recorder yesterday in ED, no events recorded.  Again, patient has documented history of PFO but most recent TEE (12/2011) showed no evidence of PFO.  Stroke team was consulted but do not believe patient's symptoms even represent TIA; recommended continuation of Plavix but discontinuation of ASA at discharge given above.  Per Dr. Rayann Heman, ok to discontinue ASA at discharge given stent placed > 1 year ago (in 2008).  Also discontinued Amitiza at discharge as this can cause headache (which is quite frightening to patient given headache with previous strokes); gave rx for Senna-docusate prn constipation at discharge.  If Hgb dropping at follow-up appointment next week, may have to discontinue Plavix per neuro.   3. HTN- BP stable but low normotensive. Continued  home Coreg 3.125 mg BID while inpatient.  Restarted lisinopril at discharge, held HCTZ.  Follow-up next week.   4. CAD- Continued home statin. Discontinued ASA per neuro recs and with Dr. Jackalyn Lombard (cardiology) permission.   5. COPD- Appears stable, continued home Albuterol.   6. Systolic CHF (EF 123456)- Appears stable.  CXR showed cardiomegaly with no acute process.  No shortness of breath or crackles on exam.  Continued home Coreg, held lisinopril at admission due to concern for TIA but restarted at discharge.   7. HIV- stable, continued home meds.  8. GERD- stable, continued PPI.    9. OSA- stable, continued CPAP.    Discharge Vitals:   BP 115/56  Pulse 72  Temp(Src) 97.9 F (36.6 C) (Oral)  Resp 20  Ht 5\' 2"  (1.575 m)  Wt 231 lb 9.6 oz (105.053 kg)  BMI 42.35 kg/m2  SpO2 96%  Discharge Labs:  Results for orders placed during the hospital encounter of 10/02/13 (from the past 24 hour(s))  URINE RAPID DRUG SCREEN ([place])     Status: None   Collection Time    10/02/13  6:45 PM      Result Value Range   Opiates NONE DETECTED  NONE DETECTED   Cocaine NONE DETECTED  NONE DETECTED   Benzodiazepines NONE DETECTED  NONE DETECTED   Amphetamines NONE DETECTED  NONE DETECTED   Tetrahydrocannabinol NONE DETECTED  NONE DETECTED   Barbiturates NONE DETECTED  NONE DETECTED  URINALYSIS, ROUTINE W REFLEX MICROSCOPIC     Status: Abnormal   Collection Time    10/02/13  6:45 PM      Result Value Range   Color, Urine YELLOW  YELLOW   APPearance CLEAR  CLEAR   Specific Gravity, Urine 1.016  1.005 - 1.030   pH 7.5  5.0 - 8.0   Glucose, UA NEGATIVE  NEGATIVE mg/dL   Hgb urine dipstick NEGATIVE  NEGATIVE   Bilirubin Urine NEGATIVE  NEGATIVE   Ketones, ur NEGATIVE  NEGATIVE mg/dL   Protein, ur NEGATIVE  NEGATIVE mg/dL   Urobilinogen, UA 1.0  0.0 - 1.0 mg/dL   Nitrite NEGATIVE  NEGATIVE   Leukocytes, UA SMALL (*) NEGATIVE  URINE MICROSCOPIC-ADD ON     Status: Abnormal   Collection  Time    10/02/13  6:45 PM      Result Value Range   Squamous Epithelial / LPF FEW (*) RARE   WBC, UA 3-6  <3 WBC/hpf   RBC / HPF 0-2  <3 RBC/hpf  TROPONIN I     Status: None   Collection Time    10/02/13  6:46 PM      Result Value Range   Troponin I <0.30  <0.30 ng/mL  CBC WITH DIFFERENTIAL     Status: Abnormal   Collection Time    10/02/13  6:46 PM      Result Value Range   WBC 4.8  4.0 - 10.5 K/uL   RBC 4.29  3.87 - 5.11 MIL/uL   Hemoglobin 10.9 (*) 12.0 - 15.0 g/dL   HCT 34.1 (*) 36.0 - 46.0 %   MCV 79.5  78.0 - 100.0 fL   MCH 25.4 (*) 26.0 - 34.0 pg   MCHC 32.0  30.0 - 36.0 g/dL   RDW 14.8  11.5 - 15.5 %   Platelets 257  150 - 400 K/uL   Neutrophils Relative % 47  43 - 77 %   Neutro Abs 2.3  1.7 - 7.7 K/uL   Lymphocytes Relative 38  12 - 46 %   Lymphs Abs 1.8  0.7 - 4.0 K/uL   Monocytes Relative 10  3 - 12 %   Monocytes Absolute 0.5  0.1 - 1.0 K/uL   Eosinophils Relative 5  0 - 5 %   Eosinophils Absolute 0.2  0.0 - 0.7 K/uL   Basophils Relative 1  0 - 1 %   Basophils Absolute 0.0  0.0 - 0.1 K/uL  COMPREHENSIVE METABOLIC PANEL     Status: Abnormal   Collection Time    10/02/13  6:46 PM      Result Value Range   Sodium 146  137 - 147 mEq/L   Potassium 3.8  3.7 - 5.3 mEq/L   Chloride 109  96 - 112 mEq/L   CO2 27  19 - 32 mEq/L   Glucose, Bld 81  70 - 99 mg/dL   BUN 11  6 - 23 mg/dL   Creatinine, Ser 0.92  0.50 - 1.10 mg/dL   Calcium 9.0  8.4 - 10.5 mg/dL   Total Protein 7.8  6.0 - 8.3 g/dL   Albumin 3.2 (*) 3.5 - 5.2 g/dL   AST 20  0 - 37 U/L   ALT 12  0 - 35 U/L   Alkaline Phosphatase 55  39 - 117 U/L   Total Bilirubin 0.3  0.3 - 1.2 mg/dL   GFR calc non Af Amer 70 (*) >90 mL/min   GFR calc Af Amer 82 (*) >90 mL/min  OCCULT BLOOD, POC DEVICE     Status: Abnormal   Collection Time    10/02/13  9:13 PM      Result Value Range   Fecal Occult Bld  POSITIVE (*) NEGATIVE  BASIC METABOLIC PANEL     Status: Abnormal   Collection Time    10/03/13  1:38 AM       Result Value Range   Sodium 143  137 - 147 mEq/L   Potassium 3.8  3.7 - 5.3 mEq/L   Chloride 108  96 - 112 mEq/L   CO2 25  19 - 32 mEq/L   Glucose, Bld 102 (*) 70 - 99 mg/dL   BUN 12  6 - 23 mg/dL   Creatinine, Ser 0.71  0.50 - 1.10 mg/dL   Calcium 8.6  8.4 - 10.5 mg/dL   GFR calc non Af Amer >90  >90 mL/min   GFR calc Af Amer >90  >90 mL/min  CBC     Status: Abnormal   Collection Time    10/03/13  1:38 AM      Result Value Range   WBC 4.2  4.0 - 10.5 K/uL   RBC 4.00  3.87 - 5.11 MIL/uL   Hemoglobin 9.9 (*) 12.0 - 15.0 g/dL   HCT 31.7 (*) 36.0 - 46.0 %   MCV 79.3  78.0 - 100.0 fL   MCH 24.8 (*) 26.0 - 34.0 pg   MCHC 31.2  30.0 - 36.0 g/dL   RDW 14.6  11.5 - 15.5 %   Platelets 243  150 - 400 K/uL  RETICULOCYTES     Status: None   Collection Time    10/03/13  1:38 AM      Result Value Range   Retic Ct Pct 1.2  0.4 - 3.1 %   RBC. 3.95  3.87 - 5.11 MIL/uL   Retic Count, Manual 47.4  19.0 - 186.0 K/uL    Signed: Ivin Poot, MD 10/03/2013, 10:19 AM Time Spent on Discharge: 40 minutes Services Ordered on Discharge: none Equipment Ordered on Discharge: none

## 2013-10-08 ENCOUNTER — Ambulatory Visit: Payer: Medicare HMO | Admitting: Nurse Practitioner

## 2013-10-09 ENCOUNTER — Encounter: Payer: Self-pay | Admitting: Internal Medicine

## 2013-10-09 ENCOUNTER — Ambulatory Visit (INDEPENDENT_AMBULATORY_CARE_PROVIDER_SITE_OTHER): Payer: Medicare HMO | Admitting: Internal Medicine

## 2013-10-09 ENCOUNTER — Other Ambulatory Visit: Payer: Self-pay | Admitting: Internal Medicine

## 2013-10-09 VITALS — BP 134/90 | HR 77 | Temp 97.5°F | Wt 236.7 lb

## 2013-10-09 DIAGNOSIS — D509 Iron deficiency anemia, unspecified: Secondary | ICD-10-CM

## 2013-10-09 DIAGNOSIS — I10 Essential (primary) hypertension: Secondary | ICD-10-CM

## 2013-10-09 DIAGNOSIS — K644 Residual hemorrhoidal skin tags: Secondary | ICD-10-CM

## 2013-10-09 DIAGNOSIS — K625 Hemorrhage of anus and rectum: Secondary | ICD-10-CM

## 2013-10-09 DIAGNOSIS — K922 Gastrointestinal hemorrhage, unspecified: Secondary | ICD-10-CM

## 2013-10-09 LAB — CBC
HCT: 35 % — ABNORMAL LOW (ref 36.0–46.0)
Hemoglobin: 11.1 g/dL — ABNORMAL LOW (ref 12.0–15.0)
MCH: 24.8 pg — ABNORMAL LOW (ref 26.0–34.0)
MCHC: 31.7 g/dL (ref 30.0–36.0)
MCV: 78.1 fL (ref 78.0–100.0)
PLATELETS: 305 10*3/uL (ref 150–400)
RBC: 4.48 MIL/uL (ref 3.87–5.11)
RDW: 15 % (ref 11.5–15.5)
WBC: 9.8 10*3/uL (ref 4.0–10.5)

## 2013-10-09 MED ORDER — LISINOPRIL 20 MG PO TABS: 20.0000 mg | ORAL_TABLET | Freq: Every day | ORAL | Status: DC

## 2013-10-09 NOTE — Assessment & Plan Note (Signed)
See above for anemia panel results.  Patient on iron supplementation at home.  She stopped taking on 1/31 due to constipation but will restart today.

## 2013-10-09 NOTE — Assessment & Plan Note (Signed)
BP borderline controlled today at 134/90.  Patient currently taking Coreg, Imdur, and lisinopril (HCTZ held at hospital discharge).  Refilled rx for lisinopril 20 mg daily today as this is the dose patient has been taking.  Provided BP log, patient will check BP daily at home and record values in log.  May need to restart HCTZ 12.5 mg daily if BP elevated on log/at follow-up.

## 2013-10-09 NOTE — Assessment & Plan Note (Addendum)
Baseline Hgb 10-11.  Hgb at discharge 10.7.  Anemia panel showed iron 19 though TIBC and ferritin within normal limits; B12, folate, and retic count also within normal limits. Patient report BRBPR yesterday with bowel movement.   CBC today, will call patient tomorrow with results.  If Hgb dropping, will call neurology to discuss continuation of Plavix for anticoagulation.  Patient to restart iron supplementation today with Amitiza for constipation.

## 2013-10-09 NOTE — Progress Notes (Signed)
Patient ID: [name], female   DOB: 05-17-1961, 53 y.o.   MRN: [id number]   Subjective:   Patient ID: [name] female   DOB: 06-20-1961 53 y.o.   MRN: [id number]  HPI: [name] is a 53 y.o. woman with history of HIV, stroke, HTN, CAD s/p stent in 2008, CHF, OSA, rectal bleeding who presents for hospital follow-up (mild GI bleed thought due to hemorrhoids vs. diverticular bleed).   Patient states that she was doing well through Saturday 1/31, was taking iron supplementation and not having rectal bleeding (though had not had BM).  On Sunday 2/1 she stopped iron supplementation as she felt this was the cause of her constipation; she took Senekot (prescribed at discharge) at that time.  She had a bowel movement yesterday 2/2; despite not straining, there was "a lot" of blood in the toilet bowl and on the toilet paper.  Patient states this scared her, and she wants to have her hemoglobin checked today because if it is low, she knows she will be tired by tomorrow and has to go to school then.   Amitiza was held while inpatient and at discharge due to possible side effect of headache which we want to avoid as patient associates this with stroke.  However, patient states she had taken this medication for a long time without headaches and really feels it helps her constipation (especially while on iron supplementation) better than anything else so wishes to resume it.    Patient reports good medication compliance with Plavix.  No further headaches since hospital discharge.  She also states that she has not been taking HCTZ since discharge (as instructed), but she could only find lisinopril 20 mg tabs at home which she has been taking despite her discharge paperwork listing 5 mg tabs (she has been prescribed lisinopril 5 mg, 10 mg, and 20 mg tabs in past).     Past Medical History  Diagnosis Date  . Cholecystitis     Gall bladder removed   . Hypercholesterolemia   .  Fibroids   . HIV (human immunodeficiency virus infection) 05/27/11    CD4 460, VL undetectable 10/12/12  . Pelvic pain in female 10/14/11  . PMB (postmenopausal bleeding)     Since 2010  . Increased BMI 05/27/11  . Anxiety   . Depression   . Hypertension   . H/O varicella   . History of measles, mumps, or rubella   . Blood transfusion 1995    "related to losing blood in urine during pregnancy"  . Hypothyroidism     Reports h/o hypothyroidism; not on any meds, TSH wnl over several years  . 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 Myocardial infarction 07/2004  . COPD (chronic obstructive pulmonary disease)     well controlled, only using albuterol inhaler occasionally   . Iron deficiency anemia   . Lower GI bleed 04/24/2012    from hemorrhoids. No recent colonoscopy   . CAD in native artery     s/p stent and restenting of LAD. On plavix  . Anginal pain   . Pneumonia 1980's    "once"  . Chronic bronchitis     "get it q yr" (09/14/2013)  . Stroke 12/2011    R insular ischemic CVA ; residual "speech messes up when I get sick or real tired" (09/14/2013)  . Arthritis     "knees; left  shoulder; right anklef/foot" (09/14/2013)  . Bursitis, shoulder     "right"  . Chronic lower back pain    Current Outpatient Prescriptions  Medication Sig Dispense Refill  . acetaminophen (TYLENOL) 500 MG tablet Take 1,000 mg by mouth every 6 (six) hours as needed for mild pain.       Marland Kitchen albuterol (PROVENTIL HFA;VENTOLIN HFA) 108 (90 BASE) MCG/ACT inhaler Inhale 2 puffs into the lungs every 6 (six) hours as needed. For wheezing and shortness of breath      . carvedilol (COREG) 3.125 MG tablet Take 1 tablet (3.125 mg total) by mouth 2 (two) times daily with a meal. 9am and 6pm  60 tablet  5  . clopidogrel (PLAVIX) 75 MG tablet Take 1 tablet (75 mg total) by mouth daily.  30 tablet  5  . diphenoxylate-atropine (LOMOTIL) 2.5-0.025 MG per tablet  Take 1 tablet by mouth 4 (four) times daily as needed. For loose stool      . elvitegravir-cobicistat-emtricitabine-tenofovir (STRIBILD) 150-150-200-300 MG TABS tablet Take 1 tablet by mouth daily with breakfast.      . gabapentin (NEURONTIN) 400 MG capsule Take 1 capsule (400 mg total) by mouth 3 (three) times daily.  90 capsule  5  . isosorbide mononitrate (IMDUR) 60 MG 24 hr tablet Take 1 tablet (60 mg total) by mouth every evening. 5pm  30 tablet  5  . lubiprostone (AMITIZA) 24 MCG capsule Take 24 mcg by mouth 2 (two) times daily with a meal.      . Multiple Vitamin (MULTIVITAMIN WITH MINERALS) TABS Take 1 tablet by mouth daily.      . naproxen (NAPROSYN) 500 MG tablet Take 1 tablet (500 mg total) by mouth 2 (two) times daily as needed.  30 tablet  1  . oxyCODONE-acetaminophen (PERCOCET) 10-325 MG per tablet Take 1 tablet by mouth every 6 (six) hours as needed for pain.  120 tablet  0  . pantoprazole (PROTONIX) 20 MG tablet TAKE 2 TABLETS (40MG ) BY  MOUTH EVERY DAY  60 tablet  0  . rosuvastatin (CRESTOR) 40 MG tablet Take 1 tablet (40 mg total) by mouth daily at 6 PM.  30 tablet  0  . traZODone (DESYREL) 50 MG tablet Take 50 mg by mouth at bedtime.      . ferrous sulfate 325 (65 FE) MG tablet Take 1 tablet (325 mg total) by mouth 3 (three) times daily with meals.  90 tablet  1  . lisinopril (PRINIVIL,ZESTRIL) 20 MG tablet Take 1 tablet (20 mg total) by mouth daily.  30 tablet  1  . nitroGLYCERIN (NITROSTAT) 0.3 MG SL tablet Place 0.3 mg under the tongue every 5 (five) minutes as needed. For chest pain      . valACYclovir (VALTREX) 500 MG tablet TAKE 1 TABLET (500MG ) BY  MOUTH TWICE DAILY FOR     FLARE [employer]  60 tablet  1  . zolpidem (AMBIEN) 5 MG tablet Take 1 tablet (5 mg total) by mouth at bedtime as needed for sleep.  30 tablet  0   No current facility-administered medications for this visit.   Family History  Problem Relation Age of Onset  . Hypertension Mother   . Hyperlipidemia Mother     . Obesity Mother   . Heart disease Mother   . Hypertension Maternal Aunt   . Hyperlipidemia Maternal Aunt   . Obesity Maternal Aunt   . Stroke Maternal Aunt    History   Social History  . Marital Status:  Single    Spouse Name: N/A    Number of Children: N/A  . Years of Education: N/A   Social History Main Topics  . Smoking status: Former Smoker -- 0.50 packs/day for 29 years    Types: Cigarettes    Quit date: 05/07/2006  . Smokeless tobacco: Never Used  . Alcohol Use: 0.0 oz/week     Comment: per pt she is an occasional drinker, last time was 09/06/13  . Drug Use: No     Comment:   pt. states has quit drug use and not done any drugs  since 3/201"  . Sexual Activity: No     Comment: gave her condoms   Other Topics Concern  . None   Social History Narrative  . None   Review of Systems: Review of Systems  Constitutional: Negative for fever and chills.  Eyes: Negative for blurred vision.  Respiratory: Negative for cough and shortness of breath.   Cardiovascular: Negative for chest pain, palpitations and leg swelling.  Gastrointestinal: Positive for blood in stool. Negative for nausea, vomiting, abdominal pain, diarrhea, constipation and melena.  Genitourinary: Negative for dysuria.  Musculoskeletal: Negative for falls.  Neurological: Negative for dizziness, tingling, focal weakness, loss of consciousness, weakness and headaches.    Objective:  Physical Exam: Filed Vitals:   10/09/13 1429  BP: 134/90  Pulse: 77  Temp: 97.5 F (36.4 C)  TempSrc: Oral  Weight: 236 lb 11.2 oz (107.366 kg)  SpO2: 100%   General: alert, cooperative, and in no apparent distress HEENT: NCAT, vision grossly intact, oropharynx clear and non-erythematous  Neck: supple, no lymphadenopathy Lungs: clear to ascultation bilaterally, normal work of respiration, no wheezes, rales, ronchi Heart: regular rate and rhythm, no murmurs, gallops, or rubs Abdomen: soft, non-tender, non-distended,  normal bowel sounds Extremities: 2+ DP/PT pulses bilaterally, no cyanosis, clubbing, or edema Neurologic: alert & oriented X3, cranial nerves II-XII intact, strength grossly intact, sensation intact to light touch  Assessment & Plan:  Patient discussed with Dr. Murlean Caller.  Please see problem-based assessment and plan.

## 2013-10-09 NOTE — Assessment & Plan Note (Signed)
Instructed patient to continue using Anusol/preparation H in case hemorrhoids are source of current rectal bleeding.  Also restarted Amitiza today which has helped with constipation and straining in past.

## 2013-10-09 NOTE — Patient Instructions (Signed)
Please follow-up with Dr. Heber [place] in 1 month.   Keep taking all of your medicines as prescribed.  You should RESTART iron supplementation and Amitiza daily to prevent constipation from the iron.  Keep taking PLAVIX.  We are also refilling your prescription for LISINOPRIL 20 mg daily.  Please check your blood pressure at home daily if possible and record the numbers in the BP log we gave you today.  You should bring this in at your next appointment so we can adjust your medicines if necessary.   We are checking your hemoglobin today, I will call you tomorrow with the result.  Keep applying the Anusol/preparation H cream to rectal area in case bleeding you are having is from hemorrhoids.    Calorie Counting Diet A calorie counting diet requires you to eat the number of calories that are right for you in a day. Calories are the measurement of how much energy you get from the food you eat. Eating the right amount of calories is important for staying at a healthy weight. If you eat too many calories, your body will store them as fat and you may gain weight. If you eat too few calories, you may lose weight. Counting the number of calories you eat during a day will help you know if you are eating the right amount. A Registered Dietitian can determine how many calories you need in a day. The amount of calories needed varies from person to person. If your goal is to lose weight, you will need to eat fewer calories. Losing weight can benefit you if you are overweight or have health problems such as heart disease, high blood pressure, or diabetes. If your goal is to gain weight, you will need to eat more calories. Gaining weight may be necessary if you have a certain health problem that causes your body to need more energy. TIPS Whether you are increasing or decreasing the number of calories you eat during a day, it may be hard to get used to changes in what you eat and drink. The following are tips to help you keep  track of the number of calories you eat.  Measure foods at home with measuring cups. This helps you know the amount of food and number of calories you are eating.  Restaurants often serve food in amounts that are larger than 1 serving. While eating out, estimate how many servings of a food you are given. For example, a serving of cooked rice is  cup or about the size of half of a fist. Knowing serving sizes will help you be aware of how much food you are eating at restaurants.  Ask for smaller portion sizes or child-size portions at restaurants.  Plan to eat half of a meal at a restaurant. Take the rest home or share the other half with a friend.  Read the Nutrition Facts panel on food labels for calorie content and serving size. You can find out how many servings are in a package, the size of a serving, and the number of calories each serving has.  For example, a package might contain 3 cookies. The Nutrition Facts panel on that package says that 1 serving is 1 cookie. Below that, it will say there are 3 servings in the container. The calories section of the Nutrition Facts label says there are 90 calories. This means there are 90 calories in 1 cookie (1 serving). If you eat 1 cookie you have eaten 90 calories. If you eat  all 3 cookies, you have eaten 270 calories (3 servings x 90 calories = 270 calories). The list below tells you how big or small some common portion sizes are.  1 oz.........4 stacked dice.  3 oz........Marland KitchenDeck of cards.  1 tsp.......Marland KitchenTip of little finger.  1 tbs......Marland KitchenMarland KitchenThumb.  2 tbs.......Marland KitchenGolf ball.   cup......Marland KitchenHalf of a fist.  1 cup.......Marland KitchenA fist. KEEP A FOOD LOG Write down every food item you eat, the amount you eat, and the number of calories in each food you eat during the day. At the end of the day, you can add up the total number of calories you have eaten. It may help to keep a list like the one below. Find out the calorie information by reading the Nutrition  Facts panel on food labels. Breakfast  Bran cereal (1 cup, 110 calories).  Fat-free milk ( cup, 45 calories). Snack  Apple (1 medium, 80 calories). Lunch  Spinach (1 cup, 20 calories).  Tomato ( medium, 20 calories).  Chicken breast strips (3 oz, 165 calories).  Shredded cheddar cheese ( cup, 110 calories).  Light [place] dressing (2 tbs, 60 calories).  Whole-wheat bread (1 slice, 80 calories).  Tub margarine (1 tsp, 35 calories).  Vegetable soup (1 cup, 160 calories). Dinner  Pork chop (3 oz, 190 calories).  Brown rice (1 cup, 215 calories).  Steamed broccoli ( cup, 20 calories).  Strawberries (1  cup, 65 calories).  Whipped cream (1 tbs, 50 calories). Daily Calorie Total: 9244 Document Released: 08/23/2005 Document Revised: 11/15/2011 Document Reviewed: 02/17/2007 [facility] Patient Information 2014 [place].

## 2013-10-10 ENCOUNTER — Telehealth: Payer: Self-pay | Admitting: Internal Medicine

## 2013-10-10 NOTE — Progress Notes (Signed)
INTERNAL MEDICINE TEACHING ATTENDING ADDENDUM - Demond Shallenberger, DO: I reviewed and discussed at the time of visit with the resident Dr. Rogers, the patient's medical history, physical examination, diagnosis and results of tests and treatment and I agree with the patient's care as documented.  

## 2013-10-10 NOTE — Telephone Encounter (Signed)
Called [name] to inform her about her Hgb of 11.1 on yesterday's CBC.  She was reassured and understands that she is to continue taking Plavix.

## 2013-10-18 ENCOUNTER — Other Ambulatory Visit: Payer: Medicare PPO

## 2013-10-18 ENCOUNTER — Ambulatory Visit: Payer: Medicare HMO | Admitting: Nurse Practitioner

## 2013-10-18 ENCOUNTER — Ambulatory Visit (INDEPENDENT_AMBULATORY_CARE_PROVIDER_SITE_OTHER): Payer: Medicare HMO | Admitting: *Deleted

## 2013-10-18 DIAGNOSIS — I639 Cerebral infarction, unspecified: Secondary | ICD-10-CM

## 2013-10-18 DIAGNOSIS — I6350 Cerebral infarction due to unspecified occlusion or stenosis of unspecified cerebral artery: Secondary | ICD-10-CM

## 2013-10-22 ENCOUNTER — Other Ambulatory Visit: Payer: Medicare PPO

## 2013-10-24 ENCOUNTER — Telehealth: Payer: Self-pay | Admitting: Cardiovascular Disease

## 2013-10-24 NOTE — Telephone Encounter (Signed)
New message    Pt needs a biopsy on her ankle.  Need clearance from Dr Johnsie Cancel to stop plavix.  Pt want to talk to his nurse

## 2013-10-25 ENCOUNTER — Other Ambulatory Visit: Payer: Commercial Managed Care - HMO

## 2013-10-25 ENCOUNTER — Ambulatory Visit: Payer: Self-pay | Admitting: Nurse Practitioner

## 2013-10-25 DIAGNOSIS — B20 Human immunodeficiency virus [HIV] disease: Secondary | ICD-10-CM

## 2013-10-26 ENCOUNTER — Telehealth: Payer: Self-pay | Admitting: Neurology

## 2013-10-26 LAB — T-HELPER CELL (CD4) - (RCID CLINIC ONLY)
CD4 T CELL ABS: 440 /uL (ref 400–2700)
CD4 T CELL HELPER: 33 % (ref 33–55)

## 2013-10-26 NOTE — Telephone Encounter (Signed)
Follow up    Need to stop plavix due to upcoming biopsy on her ankle.

## 2013-10-26 NOTE — Telephone Encounter (Signed)
Abigail Butts from [facility] is calling requesting a note stating that the patient be taken off of plavix before patient has surgery. Please advise.

## 2013-10-26 NOTE — Telephone Encounter (Signed)
i spoke with pt & have scheduled her an appt with Dr. Johnsie Cancel for 11/08/13. She has a pcp appointment just prior which is across the street.  I did tell her she would need to call her neurologist about the Plavix as he wanted this continued due to her recent stroke. Horton Chin RN

## 2013-10-26 NOTE — Telephone Encounter (Signed)
Pt called is needing a note from Dr. Leonie Man saying if it is ok for pt to be taken off clopidogrel (PLAVIX) 75 MG tablet per [facility] [place] before pt has surgery. Pt would like for Dr. Leonie Man or nurse to return her call.

## 2013-10-27 LAB — HIV-1 RNA QUANT-NO REFLEX-BLD
HIV 1 RNA QUANT: 1332 {copies}/mL — AB (ref <20)
HIV-1 RNA Quant, Log: 3.12 {Log} — ABNORMAL HIGH (ref <1.30)

## 2013-10-29 ENCOUNTER — Telehealth: Payer: Self-pay | Admitting: *Deleted

## 2013-10-29 ENCOUNTER — Other Ambulatory Visit: Payer: Self-pay | Admitting: Internal Medicine

## 2013-10-29 DIAGNOSIS — B20 Human immunodeficiency virus [HIV] disease: Secondary | ICD-10-CM

## 2013-10-29 NOTE — Telephone Encounter (Signed)
Ok to hold plavix for elective procedure for 5 days for a small but acceptable risk of stroke

## 2013-10-29 NOTE — Telephone Encounter (Signed)
Message copied by Georgena Spurling on Mon Oct 29, 2013  9:02 AM ------      Message from: Thayer Headings      Created: Sat Oct 27, 2013  9:59 AM       She is failing Stribild.  Please add genotype and have her stop Stribild unless she has just been off of it.  thanks ------

## 2013-10-29 NOTE — Telephone Encounter (Signed)
Spoke with patient and she said she has not been taking the Stribild. She did not take it for 2 weeks prior to her labs. Her follow up appt is 11/01/13. Myrtis Hopping

## 2013-10-30 NOTE — Telephone Encounter (Signed)
Called Abigail Butts at [facility] concerning the patient stopping Plavix for 5 days to have surgery, per Dr. Leonie Man. I faxed over the message from Dr. Leonie Man stating this information. I got confirmation concerning this fax.

## 2013-11-01 ENCOUNTER — Ambulatory Visit: Payer: Medicare PPO | Admitting: Internal Medicine

## 2013-11-01 LAB — MDC_IDC_ENUM_SESS_TYPE_REMOTE

## 2013-11-02 NOTE — Progress Notes (Signed)
Reviewed pts with dr Rosalie Doctor 25%-multiple medical problems-needs to be done main OR Dr Doran Durand office called

## 2013-11-05 ENCOUNTER — Other Ambulatory Visit: Payer: Self-pay | Admitting: Internal Medicine

## 2013-11-05 ENCOUNTER — Encounter (HOSPITAL_COMMUNITY): Payer: Self-pay | Admitting: Pharmacy Technician

## 2013-11-08 ENCOUNTER — Ambulatory Visit: Payer: Commercial Managed Care - HMO | Admitting: Dietician

## 2013-11-08 ENCOUNTER — Other Ambulatory Visit: Payer: Self-pay | Admitting: Dietician

## 2013-11-08 ENCOUNTER — Ambulatory Visit (HOSPITAL_BASED_OUTPATIENT_CLINIC_OR_DEPARTMENT_OTHER): Admission: RE | Admit: 2013-11-08 | Payer: Medicare HMO | Source: Ambulatory Visit | Admitting: Orthopedic Surgery

## 2013-11-08 ENCOUNTER — Encounter: Payer: Self-pay | Admitting: Internal Medicine

## 2013-11-08 ENCOUNTER — Ambulatory Visit: Payer: Medicare HMO | Admitting: Cardiovascular Disease

## 2013-11-08 ENCOUNTER — Ambulatory Visit (INDEPENDENT_AMBULATORY_CARE_PROVIDER_SITE_OTHER): Payer: Medicare HMO | Admitting: Internal Medicine

## 2013-11-08 ENCOUNTER — Encounter (HOSPITAL_BASED_OUTPATIENT_CLINIC_OR_DEPARTMENT_OTHER): Admission: RE | Payer: Self-pay | Source: Ambulatory Visit

## 2013-11-08 VITALS — BP 137/89 | HR 72 | Temp 97.8°F | Wt 238.8 lb

## 2013-11-08 DIAGNOSIS — I5042 Chronic combined systolic (congestive) and diastolic (congestive) heart failure: Secondary | ICD-10-CM

## 2013-11-08 DIAGNOSIS — E8881 Metabolic syndrome: Secondary | ICD-10-CM

## 2013-11-08 DIAGNOSIS — I509 Heart failure, unspecified: Secondary | ICD-10-CM

## 2013-11-08 DIAGNOSIS — Z79899 Other long term (current) drug therapy: Secondary | ICD-10-CM

## 2013-11-08 DIAGNOSIS — M25571 Pain in right ankle and joints of right foot: Secondary | ICD-10-CM

## 2013-11-08 DIAGNOSIS — D509 Iron deficiency anemia, unspecified: Secondary | ICD-10-CM

## 2013-11-08 DIAGNOSIS — I10 Essential (primary) hypertension: Secondary | ICD-10-CM

## 2013-11-08 DIAGNOSIS — M25579 Pain in unspecified ankle and joints of unspecified foot: Secondary | ICD-10-CM

## 2013-11-08 SURGERY — BIOPSY, SYNOVIUM
Anesthesia: General | Laterality: Right

## 2013-11-08 MED ORDER — MORPHINE SULFATE ER 15 MG PO TBCR: 15.0000 mg | EXTENDED_RELEASE_TABLET | Freq: Two times a day (BID) | ORAL | Status: DC

## 2013-11-08 MED ORDER — OXYCODONE-ACETAMINOPHEN 10-325 MG PO TABS: 1.0000 | ORAL_TABLET | Freq: Four times a day (QID) | ORAL | Status: DC | PRN

## 2013-11-08 MED ORDER — MORPHINE SULFATE ER 15 MG PO TBCR
15.0000 mg | EXTENDED_RELEASE_TABLET | Freq: Two times a day (BID) | ORAL | Status: DC
Start: 2013-11-08 — End: 2014-02-08

## 2013-11-08 NOTE — Assessment & Plan Note (Signed)
-  Continue Supplemental Iron.

## 2013-11-08 NOTE — Assessment & Plan Note (Signed)
Patient reports severe pain in right ankle lately.  Reports she has taken 4-5 of percocet this AM.  Has history inappropriately negative UDS. -Repeat UDS today.

## 2013-11-08 NOTE — Assessment & Plan Note (Signed)
Referral placed to [facility].

## 2013-11-08 NOTE — Assessment & Plan Note (Signed)
BP Readings from Last 3 Encounters:  11/08/13 137/89  10/09/13 134/90  10/03/13 115/56    Lab Results  Component Value Date   NA 143 10/03/2013   K 3.8 10/03/2013   CREATININE 0.71 10/03/2013    Assessment: Blood pressure control: controlled Progress toward BP goal:  at goal Comments:   Plan: Medications:  Lisinopril 20mg  QD, Coreg 3.125mg  BID, IMDUR 60mg  QD Educational resources provided:   Self management tools provided:   Other plans: Referral to [facility].

## 2013-11-08 NOTE — Progress Notes (Unsigned)
Need referral for this calendar year. thanks

## 2013-11-08 NOTE — Assessment & Plan Note (Signed)
Patient has had worsening right ankle pain since December 2014, she was recently seen by Dr Doran Durand who suspects osteomyelitis.  Bone biopsy is scheduled for 11/15/13 with Dr. Doran Durand - Add Mophine Sulphate ER 15mg  BID.

## 2013-11-08 NOTE — Progress Notes (Signed)
Completed.

## 2013-11-08 NOTE — Patient Instructions (Signed)
Please start taking MSCotin 15 mg twice a day for pain Take Percocet for breakthrough pain Remember to stop plavix 5 days before surgery and resume after surgery.

## 2013-11-08 NOTE — Progress Notes (Signed)
[facility] Subjective:   Patient ID: [name] female   DOB: 03-15-1961 53 y.o.   MRN: [id number]  HPI: [name] is a 53 y.o. female with a PMH significant for HIV, stroke, HTN, CAD, CHF (EF 25%, grade 1 DD on 09/14/13), OSA, COPD. She is here for regular follow up.    Right Ankle Pain She is currently tearful and explaining that she is in severe right ankle pain.  She has had worsening right ankle pain since December 2014, currently she reports this pain is a 10/10.  She was referred to orthopaedics and reports that she has had a repeat Xray and MRI of her ankle suggests osteomyelitis.  She is scheduled for bone biopsy with Dr. Doran Durand on 11/15/13 Of note back in Oct 2013 she was admitted to the hospital for right leg pain and cellulitis of her right leg.  She was readmitted in November of 2013 with suspicious for osteomyelitis however an MRI of the right ankle on 07/28/12 suggested severe synovitis without evidence of osteomyelitis, IV antibiotics were never started.  Hemorrhoids She reports no further bleeding per rectum.  CHF She denies orthopnea, SOB, or lower extremity swelling.  She reports she has been compliant with her medications.   Past Medical History  Diagnosis Date  . Cholecystitis     Gall bladder removed   . Hypercholesterolemia   . Fibroids   . HIV (human immunodeficiency virus infection) 05/27/11    CD4 460, VL undetectable 10/12/12  . Pelvic pain in female 10/14/11  . PMB (postmenopausal bleeding)     Since 2010  . Increased BMI 05/27/11  . Anxiety   . Depression   . Hypertension   . H/O varicella   . History of measles, mumps, or rubella   . Blood transfusion 1995    "related to losing blood in urine during pregnancy"  . Hypothyroidism     Reports h/o hypothyroidism; not on any meds, TSH wnl over several years  . 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 Myocardial infarction 07/2004  . COPD (chronic obstructive pulmonary disease)     well controlled, only using albuterol inhaler occasionally   . Iron deficiency anemia   . Lower GI bleed 04/24/2012    from hemorrhoids. No recent colonoscopy   . CAD in native artery     s/p stent and restenting of LAD. On plavix  . Anginal pain   . Pneumonia 1980's    "once"  . Chronic bronchitis     "get it q yr" (09/14/2013)  . Stroke 12/2011    R insular ischemic CVA ; residual "speech messes up when I get sick or real tired" (09/14/2013)  . Arthritis     "knees; left shoulder; right anklef/foot" (09/14/2013)  . Bursitis, shoulder     "right"  . Chronic lower back pain    Current Outpatient Prescriptions  Medication Sig Dispense Refill  . albuterol (PROVENTIL HFA;VENTOLIN HFA) 108 (90 BASE) MCG/ACT inhaler Inhale 2 puffs into the lungs every 6 (six) hours as needed. For wheezing and shortness of breath      . carvedilol (COREG) 3.125 MG tablet Take 1 tablet (3.125 mg total) by mouth 2 (two) times daily with a meal. 9am and 6pm  60 tablet  5  . clopidogrel (PLAVIX) 75 MG tablet Take 1 tablet (75 mg total) by mouth daily.  [place]  tablet  5  . diphenoxylate-atropine (LOMOTIL) 2.5-0.025 MG per tablet Take 1 tablet by mouth 4 (four) times daily as needed. For loose stool      . elvitegravir-cobicistat-emtricitabine-tenofovir (STRIBILD) 150-150-200-300 MG TABS tablet Take 1 tablet by mouth daily with breakfast.      . ferrous sulfate 325 (65 FE) MG tablet Take 1 tablet (325 mg total) by mouth 3 (three) times daily with meals.  90 tablet  1  . gabapentin (NEURONTIN) 400 MG capsule Take 1 capsule (400 mg total) by mouth 3 (three) times daily.  90 capsule  5  . isosorbide mononitrate (IMDUR) 60 MG 24 hr tablet Take 1 tablet (60 mg total) by mouth every evening. 5pm  30 tablet  5  . lisinopril (PRINIVIL,ZESTRIL) 20 MG tablet Take 1 tablet (20 mg total) by mouth daily.  30 tablet  1  . lubiprostone (AMITIZA) 24 MCG  capsule Take 24 mcg by mouth 2 (two) times daily with a meal.      . Multiple Vitamin (MULTIVITAMIN WITH MINERALS) TABS Take 1 tablet by mouth daily.      . naproxen (NAPROSYN) 500 MG tablet Take 1 tablet (500 mg total) by mouth 2 (two) times daily as needed.  30 tablet  1  . naproxen sodium (ANAPROX) 220 MG tablet Take 440 mg by mouth 2 (two) times daily as needed (pain).      . nitroGLYCERIN (NITROSTAT) 0.3 MG SL tablet Place 0.3 mg under the tongue every 5 (five) minutes as needed. For chest pain      . oxyCODONE-acetaminophen (PERCOCET) 10-325 MG per tablet Take 1-2 tablets by mouth every 4 (four) hours as needed for pain.      . pantoprazole (PROTONIX) 20 MG tablet Take 40 mg by mouth daily.      . rosuvastatin (CRESTOR) 40 MG tablet Take 1 tablet (40 mg total) by mouth daily at 6 PM.  30 tablet  0  . topiramate (TOPAMAX) 100 MG tablet Take 200 mg by mouth at bedtime.       . traZODone (DESYREL) 50 MG tablet Take 50 mg by mouth at bedtime.      . valACYclovir (VALTREX) 500 MG tablet Take 500 mg by mouth 2 (two) times daily as needed (flare ups).      . zolpidem (AMBIEN) 5 MG tablet Take 1 tablet (5 mg total) by mouth at bedtime as needed for sleep.  30 tablet  0   No current facility-administered medications for this visit.   Family History  Problem Relation Age of Onset  . Hypertension Mother   . Hyperlipidemia Mother   . Obesity Mother   . Heart disease Mother   . Hypertension Maternal Aunt   . Hyperlipidemia Maternal Aunt   . Obesity Maternal Aunt   . Stroke Maternal Aunt    History   Social History  . Marital Status: Single    Spouse Name: N/A    Number of Children: N/A  . Years of Education: N/A   Social History Main Topics  . Smoking status: Former Smoker -- 0.50 packs/day for 29 years    Types: Cigarettes    Quit date: 05/07/2006  . Smokeless tobacco: Never Used  . Alcohol Use: 0.0 oz/week     Comment: per pt she is an occasional drinker, last time was 09/06/13  .  Drug Use: No     Comment:   pt. states has quit drug use and not done any drugs  since 3/201"  .  Sexual Activity: No     Comment: gave her condoms   Other Topics Concern  . Not on file   Social History Narrative  . No narrative on file   Review of Systems: Review of Systems  Constitutional: Negative for fever, chills, weight loss and malaise/fatigue.  HENT: Negative for sore throat.   Eyes: Negative for blurred vision.  Respiratory: Negative for cough and shortness of breath.   Cardiovascular: Negative for chest pain, palpitations, orthopnea and leg swelling.  Gastrointestinal: Negative for heartburn, nausea, vomiting, abdominal pain, diarrhea, constipation, blood in stool and melena.  Genitourinary: Negative for dysuria and frequency.  Musculoskeletal: Positive for back pain and joint pain. Negative for falls and neck pain.  Neurological: Negative for dizziness and headaches.  Psychiatric/Behavioral: Negative for depression and substance abuse.     Objective:  Physical Exam: Filed Vitals:   11/08/13 1537  Pulse: 72  Temp: 97.8 F (36.6 C)  TempSrc: Oral  Weight: 238 lb 12.8 oz (108.319 kg)  SpO2: 99%  Physical Exam  Nursing note and vitals reviewed. Constitutional: She is oriented to person, place, and time. She appears distressed (tearful).  HENT:  Head: Normocephalic and atraumatic.  Eyes: EOM are normal. Pupils are equal, round, and reactive to light.  Cardiovascular: Normal rate, regular rhythm, normal heart sounds and intact distal pulses.   No murmur heard. Pulmonary/Chest: Effort normal and breath sounds normal.  Abdominal: Soft. Bowel sounds are normal. She exhibits no distension. There is no tenderness.  Musculoskeletal: She exhibits edema (trace).       Right ankle: She exhibits decreased range of motion (secondary to pain) and swelling (mild). Tenderness (patient reports severe tenderness with any movement of ankle).  Neurological: She is alert and oriented  to person, place, and time.  Skin: She is not diaphoretic.     Assessment & Plan:  Case discussed with Dr. Eppie Gibson See Problem Based Assessment and Plan Medications Ordered Meds ordered this encounter  Medications  . DISCONTD: morphine (MS CONTIN) 15 MG 12 hr tablet    Sig: Take 1 tablet (15 mg total) by mouth every 12 (twelve) hours.    Dispense:  60 tablet    Refill:  0    Rx 1/3  . DISCONTD: oxyCODONE-acetaminophen (PERCOCET) 10-325 MG per tablet    Sig: Take 1 tablet by mouth every 6 (six) hours as needed for pain.    Dispense:  120 tablet    Refill:  0    Rx 1/3 Please note, new pain medication contract signed. This medication can be filled on 11/08/13.  Marland Kitchen DISCONTD: oxyCODONE-acetaminophen (PERCOCET) 10-325 MG per tablet    Sig: Take 1 tablet by mouth every 6 (six) hours as needed for pain.    Dispense:  120 tablet    Refill:  0    Rx 2/3 Please fill 30 days from 11/08/13  . oxyCODONE-acetaminophen (PERCOCET) 10-325 MG per tablet    Sig: Take 1 tablet by mouth every 6 (six) hours as needed for pain.    Dispense:  120 tablet    Refill:  0    Rx 3/3 Please fill 60 days from 11/08/13  . DISCONTD: morphine (MS CONTIN) 15 MG 12 hr tablet    Sig: Take 1 tablet (15 mg total) by mouth every 12 (twelve) hours.    Dispense:  60 tablet    Refill:  0    Rx 2/3 Please fill 30 days from 11/08/13  . morphine (MS CONTIN) 15 MG 12 hr  tablet    Sig: Take 1 tablet (15 mg total) by mouth every 12 (twelve) hours.    Dispense:  60 tablet    Refill:  0    Rx 3/3 Please fill 60 days from 11/08/13   Other Orders Orders Placed This Encounter  Procedures  . Prescription Abuse Monitoring, 15 Panel  . Amb ref to Medical Nutrition Therapy-MNT    Referral Priority:  Routine    Referral Type:  Consultation    Referral Reason:  Specialty Services Required    Requested Specialty:  Nutrition    Number of Visits Requested:  1

## 2013-11-08 NOTE — Assessment & Plan Note (Signed)
Stable on current medical therapy. Has follow up appointment with Cardiology next week. ICD currently contraindicated due to possible osteomyelitis.

## 2013-11-09 ENCOUNTER — Encounter (HOSPITAL_COMMUNITY): Payer: Self-pay

## 2013-11-09 ENCOUNTER — Encounter: Payer: Medicare HMO | Admitting: Dietician

## 2013-11-09 ENCOUNTER — Encounter (HOSPITAL_COMMUNITY)
Admission: RE | Admit: 2013-11-09 | Discharge: 2013-11-09 | Disposition: A | Discharge status: Home / Self Care | Payer: Medicare HMO | Source: Ambulatory Visit | Attending: Orthopedic Surgery | Admitting: Orthopedic Surgery

## 2013-11-09 DIAGNOSIS — Z01812 Encounter for preprocedural laboratory examination: Secondary | ICD-10-CM | POA: Insufficient documentation

## 2013-11-09 HISTORY — DX: Carpal tunnel syndrome, bilateral upper limbs: G56.03

## 2013-11-09 LAB — COMPREHENSIVE METABOLIC PANEL
ALBUMIN: 3.4 g/dL — AB (ref 3.5–5.2)
ALK PHOS: 56 U/L (ref 39–117)
ALT: 12 U/L (ref 0–35)
AST: 21 U/L (ref 0–37)
BUN: 9 mg/dL (ref 6–23)
CO2: 24 mEq/L (ref 19–32)
Calcium: 9.2 mg/dL (ref 8.4–10.5)
Chloride: 106 mEq/L (ref 96–112)
Creatinine, Ser: 0.61 mg/dL (ref 0.50–1.10)
GFR calc Af Amer: >90 mL/min (ref >90)
GFR calc non Af Amer: >90 mL/min (ref >90)
Glucose, Bld: 85 mg/dL (ref 70–99)
POTASSIUM: 3.8 meq/L (ref 3.7–5.3)
SODIUM: 144 meq/L (ref 137–147)
TOTAL PROTEIN: 7.6 g/dL (ref 6.0–8.3)
Total Bilirubin: 0.6 mg/dL (ref 0.3–1.2)

## 2013-11-09 LAB — CBC
HEMATOCRIT: 34.2 % — AB (ref 36.0–46.0)
Hemoglobin: 10.8 g/dL — ABNORMAL LOW (ref 12.0–15.0)
MCH: 25.1 pg — AB (ref 26.0–34.0)
MCHC: 31.6 g/dL (ref 30.0–36.0)
MCV: 79.5 fL (ref 78.0–100.0)
PLATELETS: 260 10*3/uL (ref 150–400)
RBC: 4.3 MIL/uL (ref 3.87–5.11)
RDW: 15.5 % (ref 11.5–15.5)
WBC: 3.9 10*3/uL — ABNORMAL LOW (ref 4.0–10.5)

## 2013-11-09 LAB — PRESCRIPTION ABUSE MONITORING 15P, URINE
AMPHETAMINE/METH: NEGATIVE ng/mL
Barbiturate Screen, Urine: NEGATIVE ng/mL
Benzodiazepine Screen, Urine: NEGATIVE ng/mL
Buprenorphine, Urine: NEGATIVE ng/mL
CARISOPRODOL, URINE: NEGATIVE ng/mL
CREATININE, URINE: 408.91 mg/dL (ref >20.0)
Cannabinoid Scrn, Ur: NEGATIVE ng/mL
Cocaine Metabolites: NEGATIVE ng/mL
Fentanyl, Ur: NEGATIVE ng/mL
Meperidine, Ur: NEGATIVE ng/mL
Methadone Screen, Urine: NEGATIVE ng/mL
PROPOXYPHENE: NEGATIVE ng/mL
Tramadol Scrn, Ur: NEGATIVE ng/mL
Zolpidem, Urine: NEGATIVE ng/mL

## 2013-11-09 LAB — PROTIME-INR
INR: 0.99 (ref 0.00–1.49)
Prothrombin Time: 12.9 seconds (ref 11.6–15.2)

## 2013-11-09 LAB — APTT: aPTT: 28 seconds (ref 24–37)

## 2013-11-09 NOTE — Progress Notes (Signed)
Dr Hewitt's office made aware that orders were needed for patient's PAT appointment at 1400.

## 2013-11-09 NOTE — Pre-Procedure Instructions (Signed)
[name]  11/09/2013   Your procedure is scheduled on:  Thursday March 12 th at 1500 PM  Report to [facility]" at 1300 PM.  Call this number if you have problems the morning of surgery: [phone]   Remember:   Do not eat food or drink liquids after midnight.   Take these medicines the morning of surgery with A SIP OF WATER: Carvedilol (Coreg), Lomotil if needed, Gabapentin(Neurontin), Isosorbide mononitrate(Imdur), MS Contin, Pantoprazole (Protonix), and use and bring inhaler day of surgery.   Stop Plavix as instructed by Dr Leonie Man.  Stop Aspirin, NSAIDS (Naproxen , Vitamins and Herbal Medication 5 days prior to surgery.  Do not wear jewelry, make-up or nail polish.  Do not wear lotions, powders, or perfumes. You may wear deodorant.  Do not shave 48 hours prior to surgery.   Do not bring valuables to the hospital.  [facility] is not responsible for any belongings or valuables.               Contacts, dentures or bridgework may not be worn into surgery.  Leave suitcase in the car. After surgery it may be brought to your room.  For patients admitted to the hospital, discharge time is determined by your treatment team.               Patients discharged the day of surgery will not be allowed to drive home.    Special Instructions: [place] - Preparing for Surgery  Before surgery, you can play an important role.  Because skin is not sterile, your skin needs to be as free of germs as possible.  You can reduce the number of germs on you skin by washing with CHG (chlorahexidine gluconate) soap before surgery.  CHG is an antiseptic cleaner which kills germs and bonds with the skin to continue killing germs even after washing.  Please DO NOT use if you have an allergy to CHG or antibacterial soaps.  If your skin becomes reddened/irritated stop using the CHG and inform your nurse when you arrive at Short Stay.  Do not shave (including legs and underarms) for at least  48 hours prior to the first CHG shower.  You may shave your face.  Please follow these instructions carefully:   1.  Shower with CHG Soap the night before surgery and the morning of Surgery.  2.  If you choose to wash your hair, wash your hair first as usual with your normal shampoo.  3.  After you shampoo, rinse your hair and body thoroughly to remove the Shampoo.  4.  Use CHG as you would any other liquid soap.  You can apply chg directly  to the skin and wash gently with scrungie or a clean washcloth.  5.  Apply the CHG Soap to your body ONLY FROM THE NECK DOWN. Do not use on open wounds or open sores.  Avoid contact with your eyes,ears, mouth and genitals (private parts).  Wash genitals (private parts) with your normal soap.  6.  Wash thoroughly, paying special attention to the area where your surgery will be performed.  7.  Thoroughly rinse your body with warm water from the neck down.  8.  DO NOT shower/wash with your normal soap after using and rinsing off the CHG Soap.  9.  Pat yourself dry with a clean towel.            10.  Wear clean pajamas.  11.  Place clean sheets on your bed the night of your first shower and do not  sleep with pets.  Day of Surgery  Do not apply any lotions/deoderants the morning of surgery.  Please wear clean clothes to the hospital/surgery center.      Please read over the following fact sheets that you were given: Pain Booklet, Coughing and Deep Breathing and Surgical Site Infection Prevention

## 2013-11-09 NOTE — Progress Notes (Signed)
Case discussed with Dr. Hoffman at the time of the visit.  We reviewed the resident's history and exam and pertinent patient test results.  I agree with the assessment, diagnosis and plan of care documented in the resident's note. 

## 2013-11-12 ENCOUNTER — Ambulatory Visit (INDEPENDENT_AMBULATORY_CARE_PROVIDER_SITE_OTHER): Payer: Commercial Managed Care - HMO | Admitting: *Deleted

## 2013-11-12 DIAGNOSIS — I6350 Cerebral infarction due to unspecified occlusion or stenosis of unspecified cerebral artery: Secondary | ICD-10-CM

## 2013-11-12 DIAGNOSIS — I639 Cerebral infarction, unspecified: Secondary | ICD-10-CM

## 2013-11-12 LAB — OPIATES/OPIOIDS (LC/MS-MS)
Codeine Urine: NEGATIVE ng/mL
HYDROMORPHONE: NEGATIVE ng/mL
Heroin (6-AM), UR: NEGATIVE ng/mL
Hydrocodone: 86 ng/mL
Morphine Urine: NEGATIVE ng/mL
NORHYDROCODONE, UR: NEGATIVE ng/mL
Noroxycodone, Ur: NEGATIVE ng/mL
OXYCODONE, UR: 5434 ng/mL
Oxymorphone: NEGATIVE ng/mL

## 2013-11-12 LAB — OXYCODONE, URINE (LC/MS-MS)
Noroxycodone, Ur: NEGATIVE ng/mL
OXYCODONE, UR: 5434 ng/mL
Oxymorphone: NEGATIVE ng/mL

## 2013-11-12 LAB — MDC_IDC_ENUM_SESS_TYPE_REMOTE

## 2013-11-12 NOTE — Progress Notes (Signed)
Anesthesia Chart Review:  Patient is a 53 year old female scheduled for right ankle synovial and bone biopsy on 11/15/13 by Dr. Doran Durand for right ankle infection.  Procedure was initially scheduled for [facility], but chart reviewed by anesthesiologist Dr. Linna Caprice who recommended it be done at [facility] main OR due to her multiple co-morbidities including chronic systolic CHF with EF 53%, CVA, morbid obesity.  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 Johnsie Cancel due to HIV and osteomyelitis according to 10/13/2012 note), [employer] loop recorder 09/18/13 (d/t cryptogenic CVA; inserted by Dr. Rayann Heman,) hypercholesterolemia, right frontal CVA 09/14/13 and CVA 12/2011, iron deficiency anemia, anxiety, depression, COPD/chronic bronchitis, arthritis, lower GI bleed 09/2013, blood transfusion history, PNA '80's. BMI is 43.52 consistent with morbid obesity. PCP is with [facility], last visit with Dr. Joni Reining on 11/09/13, and he is aware of plans for surgery. Cardiologist is Dr. Johnsie Cancel.  Dr. Augustin Coupe' office contacted Dr. Kyla Balzarine office regarding plans for surgery.  They were referred to Neurologist Dr. Leonie Man who gave permission to hold Plavix for 5 days preoperatively.  She is actually scheduled to see Dr. Johnsie Cancel on 11/16/13. ID is Dr. Linus Salmons.   Her last two EKG from 10/02/13 and 09/14/13 noted in Epic.  They show SR, old anteroseptal infarct, non-specific T wave abnormalities. There is question of minimal ST elevation in inferior leads on 10/02/13, but there is also artifact in these leads making interpretation difficult. She has known anterolateral T wave abnormality (and more  non-specific inferior T wave abnormality) dating back to at least 07/31/12.  Echo on 09/14/13 showed: Mildly dilated LV with EF 25%. Wall motion abnormality pattern consistent with prior MI in LAD territory. Normal RV size and systolic function. No significant valvular abnormalities. (EF 12/21/11 by TEE was 25-30%.  No felt to be a good ICD candidate due to HIV and osteomyelitis.)  Nuclear stress test on 08/02/12 showed: 1. Large infarct involving the apical segments and apex. 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 No reversible defects are present. 2. Global hypokinesia and apical dyskinesia.  3. Left ventricular ejection fraction equals 36%. Results were felt unchanged from previous testing, so continued medical therapy was recommended.  According to 06/23/07 consult note by Dr. Kathrynn Humble, cardiac cath at [facility] in 06/2007 (in the setting of NSTEMI) "showed normal left main, left circumflex, and RCA. Her LAD was totally occluded within the stent. There were good right to left and left to left collaterals. The decision was made to treat her medically as this was thought to be a chronic total occlusion."  Event monitor in 04/2012 showed NSR.  Carotid duplex on 09/14/13 showed: - The vertebral arteries appear patent with antegrade flow. - Findings consistent with1-39 percent stenosis involving the right internal carotid artery and the left internal carotid artery.  CXR on 10/02/13 showed cardiomegaly without acute process.  Preoperative labs noted.    History previously reviewed by Dr. Linna Caprice and case moved to main OR.  Further evaluation by her assigned anesthesiologist on the day of surgery.  If no new CV/CHF or neurological changes then I would anticipate that she could proceed as planned.    Myra Gianotti, PA-C [facility]  Short Stay Center/Anesthesiology Phone [phone] 11/12/2013 1:40 PM

## 2013-11-14 ENCOUNTER — Other Ambulatory Visit: Payer: Self-pay | Admitting: Orthopedic Surgery

## 2013-11-14 NOTE — Progress Notes (Signed)
Left a message for pt that new arrival time is 12:30 PM.Asked that she call and confirm that she got this message
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
## 2013-11-15 ENCOUNTER — Encounter (HOSPITAL_COMMUNITY)
Admission: RE | Disposition: A | Discharge status: Home / Self Care | Payer: Self-pay | Source: Ambulatory Visit | Attending: Orthopedic Surgery

## 2013-11-15 ENCOUNTER — Ambulatory Visit (HOSPITAL_COMMUNITY): Payer: Medicare HMO | Admitting: Anesthesiology

## 2013-11-15 ENCOUNTER — Ambulatory Visit (HOSPITAL_COMMUNITY)
Admission: RE | Admit: 2013-11-15 | Discharge: 2013-11-15 | Disposition: A | Discharge status: Home / Self Care | Payer: Medicare HMO | Source: Ambulatory Visit | Attending: Orthopedic Surgery | Admitting: Orthopedic Surgery

## 2013-11-15 ENCOUNTER — Telehealth: Payer: Self-pay | Admitting: Infectious Disease

## 2013-11-15 ENCOUNTER — Encounter (HOSPITAL_COMMUNITY): Payer: Self-pay | Admitting: *Deleted

## 2013-11-15 ENCOUNTER — Encounter (HOSPITAL_COMMUNITY): Payer: Medicare HMO | Admitting: Vascular Surgery

## 2013-11-15 DIAGNOSIS — Z87891 Personal history of nicotine dependence: Secondary | ICD-10-CM | POA: Insufficient documentation

## 2013-11-15 DIAGNOSIS — F411 Generalized anxiety disorder: Secondary | ICD-10-CM | POA: Insufficient documentation

## 2013-11-15 DIAGNOSIS — I69928 Other speech and language deficits following unspecified cerebrovascular disease: Secondary | ICD-10-CM | POA: Insufficient documentation

## 2013-11-15 DIAGNOSIS — M009 Pyogenic arthritis, unspecified: Secondary | ICD-10-CM | POA: Insufficient documentation

## 2013-11-15 DIAGNOSIS — F3289 Other specified depressive episodes: Secondary | ICD-10-CM | POA: Insufficient documentation

## 2013-11-15 DIAGNOSIS — F329 Major depressive disorder, single episode, unspecified: Secondary | ICD-10-CM | POA: Insufficient documentation

## 2013-11-15 DIAGNOSIS — E039 Hypothyroidism, unspecified: Secondary | ICD-10-CM | POA: Insufficient documentation

## 2013-11-15 DIAGNOSIS — J449 Chronic obstructive pulmonary disease, unspecified: Secondary | ICD-10-CM | POA: Insufficient documentation

## 2013-11-15 DIAGNOSIS — J4489 Other specified chronic obstructive pulmonary disease: Secondary | ICD-10-CM | POA: Insufficient documentation

## 2013-11-15 DIAGNOSIS — E7800 Pure hypercholesterolemia, unspecified: Secondary | ICD-10-CM | POA: Insufficient documentation

## 2013-11-15 DIAGNOSIS — I10 Essential (primary) hypertension: Secondary | ICD-10-CM | POA: Insufficient documentation

## 2013-11-15 DIAGNOSIS — Z9861 Coronary angioplasty status: Secondary | ICD-10-CM | POA: Insufficient documentation

## 2013-11-15 DIAGNOSIS — I2510 Atherosclerotic heart disease of native coronary artery without angina pectoris: Secondary | ICD-10-CM | POA: Insufficient documentation

## 2013-11-15 DIAGNOSIS — Z21 Asymptomatic human immunodeficiency virus [HIV] infection status: Secondary | ICD-10-CM | POA: Insufficient documentation

## 2013-11-15 DIAGNOSIS — Z7902 Long term (current) use of antithrombotics/antiplatelets: Secondary | ICD-10-CM | POA: Insufficient documentation

## 2013-11-15 DIAGNOSIS — I252 Old myocardial infarction: Secondary | ICD-10-CM | POA: Insufficient documentation

## 2013-11-15 HISTORY — PX: SYNOVIAL BIOPSY: SHX5041

## 2013-11-15 SURGERY — BIOPSY, SYNOVIUM
Anesthesia: General | Site: Ankle | Laterality: Right

## 2013-11-15 MED ORDER — 0.9 % SODIUM CHLORIDE (POUR BTL) OPTIME
TOPICAL | Status: DC | PRN
  Administered 2013-11-15: 1000 mL

## 2013-11-15 MED ORDER — LACTATED RINGERS IV SOLN
INTRAVENOUS | Status: DC
  Administered 2013-11-15: 14:00:00 via INTRAVENOUS

## 2013-11-15 MED ORDER — LIDOCAINE HCL (CARDIAC) 20 MG/ML IV SOLN
INTRAVENOUS | Status: AC
  Filled 2013-11-15: qty 5

## 2013-11-15 MED ORDER — MIDAZOLAM HCL 5 MG/5ML IJ SOLN
INTRAMUSCULAR | Status: DC | PRN
  Administered 2013-11-15 (×2): 1 mg via INTRAVENOUS

## 2013-11-15 MED ORDER — MORPHINE SULFATE 10 MG/ML IJ SOLN
INTRAMUSCULAR | Status: DC | PRN
  Administered 2013-11-15: 2 mg via INTRAVENOUS
  Administered 2013-11-15: 4 mg via INTRAVENOUS

## 2013-11-15 MED ORDER — FENTANYL CITRATE 0.05 MG/ML IJ SOLN
INTRAMUSCULAR | Status: AC
  Administered 2013-11-15: 100 ug
  Filled 2013-11-15: qty 2

## 2013-11-15 MED ORDER — ONDANSETRON HCL 4 MG/2ML IJ SOLN
INTRAMUSCULAR | Status: AC
  Filled 2013-11-15: qty 2

## 2013-11-15 MED ORDER — MIDAZOLAM HCL 2 MG/2ML IJ SOLN
INTRAMUSCULAR | Status: AC
  Filled 2013-11-15: qty 2

## 2013-11-15 MED ORDER — PROPOFOL 10 MG/ML IV BOLUS
INTRAVENOUS | Status: AC
  Filled 2013-11-15: qty 20

## 2013-11-15 MED ORDER — CEPHALEXIN 500 MG PO CAPS: 500.0000 mg | ORAL_CAPSULE | Freq: Four times a day (QID) | ORAL | Status: DC

## 2013-11-15 MED ORDER — PHENYLEPHRINE HCL 10 MG/ML IJ SOLN
INTRAMUSCULAR | Status: DC | PRN
  Administered 2013-11-15: 40 ug via INTRAVENOUS

## 2013-11-15 MED ORDER — MORPHINE SULFATE 10 MG/ML IJ SOLN
INTRAMUSCULAR | Status: AC
  Filled 2013-11-15: qty 1

## 2013-11-15 MED ORDER — FENTANYL CITRATE 0.05 MG/ML IJ SOLN
INTRAMUSCULAR | Status: DC | PRN
  Administered 2013-11-15 (×2): 50 ug via INTRAVENOUS
  Administered 2013-11-15 (×2): 25 ug via INTRAVENOUS

## 2013-11-15 MED ORDER — OXYCODONE HCL 5 MG PO TABS: 5.0000 mg | ORAL_TABLET | Freq: Once | ORAL | Status: DC | PRN

## 2013-11-15 MED ORDER — HYDROMORPHONE HCL PF 1 MG/ML IJ SOLN: 0.2500 mg | INTRAMUSCULAR | Status: DC | PRN

## 2013-11-15 MED ORDER — SODIUM CHLORIDE 0.9 % IV SOLN: INTRAVENOUS | Status: DC

## 2013-11-15 MED ORDER — ONDANSETRON HCL 4 MG/2ML IJ SOLN: 4.0000 mg | Freq: Once | INTRAMUSCULAR | Status: DC | PRN

## 2013-11-15 MED ORDER — CHLORHEXIDINE GLUCONATE 4 % EX LIQD: 60.0000 mL | Freq: Once | CUTANEOUS | Status: DC

## 2013-11-15 MED ORDER — PHENYLEPHRINE 40 MCG/ML (10ML) SYRINGE FOR IV PUSH (FOR BLOOD PRESSURE SUPPORT)
PREFILLED_SYRINGE | INTRAVENOUS | Status: AC
  Filled 2013-11-15: qty 10

## 2013-11-15 MED ORDER — DEXAMETHASONE SODIUM PHOSPHATE 4 MG/ML IJ SOLN
INTRAMUSCULAR | Status: DC | PRN
  Administered 2013-11-15: 4 mg via INTRAVENOUS

## 2013-11-15 MED ORDER — SODIUM CHLORIDE 0.9 % IJ SOLN
INTRAMUSCULAR | Status: AC
  Filled 2013-11-15: qty 10

## 2013-11-15 MED ORDER — LIDOCAINE HCL (CARDIAC) 20 MG/ML IV SOLN
INTRAVENOUS | Status: DC | PRN
  Administered 2013-11-15: 60 mg via INTRAVENOUS

## 2013-11-15 MED ORDER — DEXAMETHASONE SODIUM PHOSPHATE 4 MG/ML IJ SOLN
INTRAMUSCULAR | Status: AC
Start: 2013-11-15 — End: 2013-11-15
  Filled 2013-11-15: qty 1

## 2013-11-15 MED ORDER — PROPOFOL 10 MG/ML IV BOLUS
INTRAVENOUS | Status: DC | PRN
  Administered 2013-11-15: 150 mg via INTRAVENOUS

## 2013-11-15 MED ORDER — OXYCODONE HCL 5 MG/5ML PO SOLN: 5.0000 mg | Freq: Once | ORAL | Status: DC | PRN

## 2013-11-15 MED ORDER — CEFAZOLIN SODIUM-DEXTROSE 2-3 GM-% IV SOLR
INTRAVENOUS | Status: AC
  Filled 2013-11-15: qty 50

## 2013-11-15 MED ORDER — MIDAZOLAM HCL 2 MG/2ML IJ SOLN
INTRAMUSCULAR | Status: AC
  Administered 2013-11-15: 2 mg
  Filled 2013-11-15: qty 2

## 2013-11-15 MED ORDER — FENTANYL CITRATE 0.05 MG/ML IJ SOLN
INTRAMUSCULAR | Status: AC
  Filled 2013-11-15: qty 5

## 2013-11-15 MED ORDER — ONDANSETRON HCL 4 MG/2ML IJ SOLN
INTRAMUSCULAR | Status: DC | PRN
  Administered 2013-11-15: 4 mg via INTRAVENOUS

## 2013-11-15 SURGICAL SUPPLY — 52 items
BANDAGE ESMARK 6X9 LF (GAUZE/BANDAGES/DRESSINGS) ×1 IMPLANT
BLADE SURG 10 STRL SS (BLADE) ×3 IMPLANT
BNDG COHESIVE 4X5 TAN STRL (GAUZE/BANDAGES/DRESSINGS) ×3 IMPLANT
BNDG COHESIVE 6X5 TAN STRL LF (GAUZE/BANDAGES/DRESSINGS) ×3 IMPLANT
BNDG ESMARK 6X9 LF (GAUZE/BANDAGES/DRESSINGS) ×3
CANISTER SUCT 3000ML (MISCELLANEOUS) ×3 IMPLANT
CHLORAPREP W/TINT 26ML (MISCELLANEOUS) ×3 IMPLANT
CONT SPECI 4OZ STER CLIK (MISCELLANEOUS) ×3 IMPLANT
COVER SURGICAL LIGHT HANDLE (MISCELLANEOUS) ×3 IMPLANT
CUFF TOURNIQUET SINGLE 34IN LL (TOURNIQUET CUFF) ×3 IMPLANT
CUFF TOURNIQUET SINGLE 44IN (TOURNIQUET CUFF) IMPLANT
DRAPE C-ARM MINI 42X72 WSTRAPS (DRAPES) ×3 IMPLANT
DRAPE U-SHAPE 47X51 STRL (DRAPES) ×3 IMPLANT
DRSG ADAPTIC 3X8 NADH LF (GAUZE/BANDAGES/DRESSINGS) ×3 IMPLANT
DRSG PAD ABDOMINAL 8X10 ST (GAUZE/BANDAGES/DRESSINGS) IMPLANT
ELECT REM PT RETURN 9FT ADLT (ELECTROSURGICAL) ×3
ELECTRODE REM PT RTRN 9FT ADLT (ELECTROSURGICAL) ×1 IMPLANT
GLOVE BIO SURGEON STRL SZ7 (GLOVE) ×3 IMPLANT
GLOVE BIO SURGEON STRL SZ8 (GLOVE) ×3 IMPLANT
GLOVE BIOGEL PI IND STRL 7.5 (GLOVE) ×1 IMPLANT
GLOVE BIOGEL PI IND STRL 8 (GLOVE) ×1 IMPLANT
GLOVE BIOGEL PI INDICATOR 7.5 (GLOVE) ×2
GLOVE BIOGEL PI INDICATOR 8 (GLOVE) ×2
GOWN STRL REUS W/ TWL LRG LVL3 (GOWN DISPOSABLE) ×2 IMPLANT
GOWN STRL REUS W/ TWL XL LVL3 (GOWN DISPOSABLE) ×1 IMPLANT
GOWN STRL REUS W/TWL LRG LVL3 (GOWN DISPOSABLE) ×4
GOWN STRL REUS W/TWL XL LVL3 (GOWN DISPOSABLE) ×2
KIT BASIN OR (CUSTOM PROCEDURE TRAY) ×3 IMPLANT
KIT ROOM TURNOVER OR (KITS) ×3 IMPLANT
NEEDLE 22X1 1/2 (OR ONLY) (NEEDLE) IMPLANT
NS IRRIG 1000ML POUR BTL (IV SOLUTION) ×3 IMPLANT
PACK ORTHO EXTREMITY (CUSTOM PROCEDURE TRAY) ×3 IMPLANT
PAD ABD 8X10 STRL (GAUZE/BANDAGES/DRESSINGS) ×3 IMPLANT
PAD ARMBOARD 7.5X6 YLW CONV (MISCELLANEOUS) ×6 IMPLANT
PAD CAST 4YDX4 CTTN HI CHSV (CAST SUPPLIES) ×1 IMPLANT
PADDING CAST COTTON 4X4 STRL (CAST SUPPLIES) ×2
SOAP 2 % CHG 4 OZ (WOUND CARE) ×3 IMPLANT
SPECIMEN JAR SMALL (MISCELLANEOUS) ×3 IMPLANT
SPONGE GAUZE 4X4 12PLY (GAUZE/BANDAGES/DRESSINGS) ×3 IMPLANT
STAPLER VISISTAT 35W (STAPLE) IMPLANT
SUCTION FRAZIER TIP 10 FR DISP (SUCTIONS) ×3 IMPLANT
SUT PROLENE 3 0 PS 2 (SUTURE) ×3 IMPLANT
SUT VIC AB 2-0 CT1 27 (SUTURE)
SUT VIC AB 2-0 CT1 TAPERPNT 27 (SUTURE) IMPLANT
SUT VIC AB 3-0 PS2 18 (SUTURE)
SUT VIC AB 3-0 PS2 18XBRD (SUTURE) IMPLANT
SYR CONTROL 10ML LL (SYRINGE) IMPLANT
TOWEL OR 17X24 6PK STRL BLUE (TOWEL DISPOSABLE) ×3 IMPLANT
TOWEL OR 17X26 10 PK STRL BLUE (TOWEL DISPOSABLE) ×3 IMPLANT
TUBE CONNECTING 12'X1/4 (SUCTIONS) ×1
TUBE CONNECTING 12X1/4 (SUCTIONS) ×2 IMPLANT
WATER STERILE IRR 1000ML POUR (IV SOLUTION) ×3 IMPLANT

## 2013-11-15 NOTE — Transfer of Care (Signed)
Immediate Anesthesia Transfer of Care Note  Patient: [name]  Procedure(s) Performed: Procedure(s): RIGHT ANKLE SYNOVIAL AND BONE BIOPSY (Right)  Patient Location: PACU  Anesthesia Type:GA combined with regional for post-op pain  Level of Consciousness: awake, alert  and oriented  Airway & Oxygen Therapy: Patient Spontanous Breathing and Patient connected to nasal cannula oxygen  Post-op Assessment: Report given to PACU RN, Post -op Vital signs reviewed and stable and Patient moving all extremities  Post vital signs: Reviewed and stable  Complications: No apparent anesthesia complications

## 2013-11-15 NOTE — Preoperative (Signed)
Beta Blockers   Reason not to administer Beta Blockers:Not Applicable, pt took coreg 11/15/13

## 2013-11-15 NOTE — Anesthesia Postprocedure Evaluation (Signed)
Anesthesia Post Note  Patient: [name]  Procedure(s) Performed: Procedure(s) (LRB): RIGHT ANKLE SYNOVIAL AND BONE BIOPSY (Right)  Anesthesia type: general  Patient location: PACU  Post pain: Pain level controlled  Post assessment: Patient's Cardiovascular Status Stable  Post vital signs: Reviewed and stable  Level of consciousness: sedated  Complications: No apparent anesthesia complications

## 2013-11-15 NOTE — Anesthesia Procedure Notes (Addendum)
Anesthesia Regional Block:  Popliteal block  Pre-Anesthetic Checklist: ,, timeout performed, Correct Patient, Correct Site, Correct Laterality, Correct Procedure, Correct Position, site marked, Risks and benefits discussed,  Surgical consent,  Pre-op evaluation,  At surgeon's request and post-op pain management  Laterality: Right  Prep: chloraprep       Needles:  Injection technique: Single-shot  Needle Type: Echogenic Stimulator Needle     Needle Length: 9cm 9 cm Needle Gauge: 22 and 22 G    Additional Needles:  Procedures: ultrasound guided (picture in chart) and nerve stimulator Popliteal block Narrative:  Start time: 11/15/2013 2:14 PM End time: 11/15/2013 2:20 PM Injection made incrementally with aspirations every 20 mL.  Performed by: Personally   Additional Notes: 20 cc 0.5% marcaine 1:200 Epi injected easily   Anesthesia Regional Block:  Adductor canal block  Pre-Anesthetic Checklist: ,, timeout performed, Correct Patient, Correct Site, Correct Laterality, Correct Procedure, Correct Position, site marked, Risks and benefits discussed,  Surgical consent,  Pre-op evaluation,  At surgeon's request and post-op pain management  Laterality: Right  Prep: chloraprep       Needles:  Injection technique: Single-shot  Needle Type: Echogenic Stimulator Needle     Needle Length: 9cm 9 cm Needle Gauge: 22 and 22 G    Additional Needles:  Procedures: ultrasound guided (picture in chart) Adductor canal block Narrative:  Start time: 11/15/2013 2:20 PM End time: 11/15/2013 2:25 PM Injection made incrementally with aspirations every 5 mL.  Performed by: Personally   Additional Notes: 15 cc 0.5% marcaine 1:200 Epi injected easily   Procedure Name: LMA Insertion Date/Time: 11/15/2013 2:37 PM Performed by: Carola Frost Pre-anesthesia Checklist: Patient identified, Timeout performed, Emergency Drugs available, Suction available and Patient being  monitored Patient Re-evaluated:Patient Re-evaluated prior to inductionOxygen Delivery Method: Circle system utilized Preoxygenation: Pre-oxygenation with 100% oxygen Intubation Type: IV induction LMA: LMA inserted LMA Size: 4.0 Number of attempts: 1 Placement Confirmation: positive ETCO2,  breath sounds checked- equal and bilateral and CO2 detector Tube secured with: Tape Dental Injury: Teeth and Oropharynx as per pre-operative assessment

## 2013-11-15 NOTE — Telephone Encounter (Signed)
I received a call from Wylene Simmer re [name] it appears that she has fairly severe osteomyelitis and will likely require a BKA for surgery.S he had biopsies today to confirm this.  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  She needs to come to clinic next week to be seen have her labs drawn including HIV geno and integrase and possibly have her regimen changed  She also needs to have everything explained from [place] to reinforce what Dr. Doran Durand is going to need to do to cure her if this is indeed osteo in her talus and tibia  Can we get her into clinic next week  She will also be on some oral abx to temporize things while biopsies come back

## 2013-11-15 NOTE — H&P (Signed)
Pt seen and examined.  Agree with note above.  A:  Right distal tibia and talus osteomyelitis - to OR for biopsy of distal tibia and talus.  The risks and benefits of the alternative treatment options have been discussed in detail.  The patient wishes to proceed with surgery and specifically understands risks of bleeding, infection, nerve damage, blood clots, need for additional surgery, amputation and death.

## 2013-11-15 NOTE — Discharge Instructions (Signed)
Wylene Simmer, MD [place]  Please read the following information regarding your care after surgery.  Medications  You only need a prescription for the narcotic pain medicine (ex. oxycodone, Percocet, Norco).  All of the other medicines listed below are available over the counter. X percocet as prescribed for pain  Narcotic pain medicine (ex. oxycodone, Percocet, Vicodin) will cause constipation.  To prevent this problem, take the following medicines while you are taking any pain medicine. X docusate sodium (Colace) 100 mg twice a day X senna (Senokot) 2 tablets twice a day  X To help prevent blood clots, take an aspirin (325 mg) once a day for a month after surgery.  You should also get up every hour while you are awake to move around.    Weight Bearing X Bear weight when you are able on your operated leg or foot in a cam walker boot.  Cast / Splint / Dressing X Keep your dressing clean and dry.  Dont put anything (coat hanger, pencil, etc) down inside of it.  If it gets damp, use a hair dryer on the cool setting to dry it.  If it gets soaked, call the office to schedule an appointment for a cast change.  After your dressing, cast or splint is removed; you may shower, but do not soak or scrub the wound.  Allow the water to run over it, and then gently pat it dry.  Swelling It is normal for you to have swelling where you had surgery.  To reduce swelling and pain, keep your toes above your nose for at least 3 days after surgery.  It may be necessary to keep your foot or leg elevated for several weeks.  If it hurts, it should be elevated.  Follow Up Call my office at [phone] when you are discharged from the hospital or surgery center to schedule an appointment to be seen two weeks after surgery.  Call my office at [phone] if you develop a fever >101.5 F, nausea, vomiting, bleeding from the surgical site or severe pain.

## 2013-11-15 NOTE — Brief Op Note (Signed)
11/15/2013  3:35 PM  PATIENT:  [name]  54 y.o. female  PRE-OPERATIVE DIAGNOSIS:   RIGHT ANKLE INFECTION  POST-OPERATIVE DIAGNOSIS:  same  Procedure(s): 1.  Right ankle arthrotomy and synovial biopsy 2.  Right distal tibia bone biopsy 3.  Right talus bone biopsy  SURGEON:  Wylene Simmer, MD  ASSISTANT: n/a  ANESTHESIA:   General, regional  EBL:  minimal   TOURNIQUET:    COMPLICATIONS:  None apparent  DISPOSITION:  Extubated, awake and stable to recovery.  DICTATION ID:  [id number]

## 2013-11-15 NOTE — Anesthesia Preprocedure Evaluation (Addendum)
Anesthesia Evaluation  Patient identified by MRN, date of birth, ID band Patient awake    Reviewed: Allergy & Precautions, H&P , NPO status , Patient's Chart, lab work & pertinent test results  Airway Mallampati: II TM Distance: >3 FB Neck ROM: Full    Dental  (+) Dental Advisory Given, Teeth Intact   Pulmonary sleep apnea , COPDformer smoker,  breath sounds clear to auscultation        Cardiovascular hypertension, + angina + CAD, + Past MI and +CHF Rhythm:Regular Rate:Normal     Neuro/Psych PSYCHIATRIC DISORDERS Anxiety Depression TIACVA    GI/Hepatic   Endo/Other  Hypothyroidism   Renal/GU      Musculoskeletal   Abdominal (+) + obese,   Peds  Hematology  (+) anemia ,   Anesthesia Other Findings   Reproductive/Obstetrics                        Anesthesia Physical Anesthesia Plan  ASA: III  Anesthesia Plan: General   Post-op Pain Management:    Induction: Intravenous  Airway Management Planned: Oral ETT  Additional Equipment:   Intra-op Plan:   Post-operative Plan: Extubation in OR  Informed Consent: I have reviewed the patients History and Physical, chart, labs and discussed the procedure including the risks, benefits and alternatives for the proposed anesthesia with the patient or authorized representative who has indicated his/her understanding and acceptance.   Dental advisory given  Plan Discussed with: CRNA and Anesthesiologist  Anesthesia Plan Comments: (R. Ankle osteomyelitis CAD, HTN, CAD s/p LAD stent in 2005 and 04/2007  Ischemic cardiomyopathy EF 25% no ischemia on 08/02/12 HIV on meds H/O cocaine abuse H/O CVAs  Plan GA with LMA and combined popliteal and adductor canal blocks  Roberts Gaudy, MD   )     Anesthesia Quick Evaluation

## 2013-11-16 ENCOUNTER — Encounter: Payer: Self-pay | Admitting: Cardiovascular Disease

## 2013-11-16 ENCOUNTER — Ambulatory Visit (INDEPENDENT_AMBULATORY_CARE_PROVIDER_SITE_OTHER): Payer: Medicare HMO | Admitting: Dietician

## 2013-11-16 ENCOUNTER — Ambulatory Visit (INDEPENDENT_AMBULATORY_CARE_PROVIDER_SITE_OTHER): Payer: Commercial Managed Care - HMO | Admitting: Cardiovascular Disease

## 2013-11-16 VITALS — BP 137/89 | HR 73 | Ht 62.0 in

## 2013-11-16 DIAGNOSIS — M869 Osteomyelitis, unspecified: Secondary | ICD-10-CM

## 2013-11-16 DIAGNOSIS — I10 Essential (primary) hypertension: Secondary | ICD-10-CM

## 2013-11-16 DIAGNOSIS — E785 Hyperlipidemia, unspecified: Secondary | ICD-10-CM

## 2013-11-16 DIAGNOSIS — B20 Human immunodeficiency virus [HIV] disease: Secondary | ICD-10-CM

## 2013-11-16 DIAGNOSIS — R9439 Abnormal result of other cardiovascular function study: Secondary | ICD-10-CM

## 2013-11-16 DIAGNOSIS — I2510 Atherosclerotic heart disease of native coronary artery without angina pectoris: Secondary | ICD-10-CM

## 2013-11-16 DIAGNOSIS — S88119A Complete traumatic amputation at level between knee and ankle, unspecified lower leg, initial encounter: Secondary | ICD-10-CM

## 2013-11-16 DIAGNOSIS — R931 Abnormal findings on diagnostic imaging of heart and coronary circulation: Secondary | ICD-10-CM

## 2013-11-16 DIAGNOSIS — Z8673 Personal history of transient ischemic attack (TIA), and cerebral infarction without residual deficits: Secondary | ICD-10-CM

## 2013-11-16 NOTE — Assessment & Plan Note (Signed)
Stable with no angina and good activity level.  Continue medical Rx Normal stress test in 2013

## 2013-11-16 NOTE — Op Note (Signed)
[name], [name] NO.:  [id number]  MEDICAL RECORD NO.:  [id number]  LOCATION:  [facility]                         FACILITY:  [facility]  PHYSICIAN:  Wylene Simmer, MD        DATE OF BIRTH:  [birth year]  DATE OF PROCEDURE:  11/15/2013 DATE OF DISCHARGE:  11/15/2013                              OPERATIVE REPORT   PREOPERATIVE DIAGNOSIS:  Right ankle infection.  POSTOPERATIVE DIAGNOSIS:  Right ankle infection.  PROCEDURE: 1. Right ankle arthrotomy and synovial biopsy. 2. Right distal tibia bone biopsy. 3. Right talus bone biopsy.  SURGEON:  Wylene Simmer, MD  ANESTHESIA:  General, regional.  ESTIMATED BLOOD LOSS:  Minimal.  TOURNIQUET TIME:  Less than 1 hour at 350 mmHg.  COMPLICATIONS:  None apparent.  DISPOSITION:  Extubated, awake, and stable to recovery.  SPECIMENS:  Bone from the talus, tibia, and synovium to microbiology and bone to pathology.  INDICATIONS FOR PROCEDURE:  The patient is a 53 year old woman with past medical history significant for HIV, stroke, and coronary artery disease.  She has a history of ankle infection several years ago.  She presented to my clinic about a month ago, with a MRI showing osteomyelitis of the distal tibia and talus as well as arthrosis of the tibiotalar joint.  We had a long discussion that day about the possible implications of this diagnosis including the potential need for below- knee amputation as a curative procedure.  She presents now for biopsy of the distal tibia and talus as well as the synovium.  She understands the risks and benefits, the alternative treatment options and elects surgical treatment.  She specifically understands risks of bleeding, infection, nerve damage, blood clots, need for additional surgery, amputation, and death.  PROCEDURE IN DETAIL:  After preoperative consent was obtained, the correct operative site was identified.  The patient was brought to the operating room and placed supine  on the operating table.  General anesthesia was induced.  Preoperative antibiotics were held.  A surgical time-out was taken.  The right lower extremity was prepped and draped in standard sterile fashion with a tourniquet around the thigh.  The extremity was exsanguinated.  The tourniquet was inflated to 350 mmHg. A longitudinal incision was made over the anterior ankle joint.  Sharp dissection was carried down through the skin and subcutaneous tissue. Extensor retinaculum was opened over the extensor hallucis longus tendon.  The interval between the EHL and tibialis anterior was developed.  The anterior ankle joint capsule was incised.  It was noted to be quite thickened.  There was extensive synovitis throughout the anterior aspect of the tibiotalar joint.  Several pieces of synovium were then harvested with a rongeur and sent as a specimen to microbiology for aerobic and anaerobic culture.  The joint itself was examined.  There was full-thickness cartilage loss, as well as erosion of the Taylor dome noted anteriorly and centrally.  Wound was irrigated copiously.  The distal tibia was then exposed subperiosteally.  An 8-mm trephine was used to harvest a plug of bone through the distal tibia. This was within the zone of edema noted on the MRI.  This specimen was then cut in half.  Half was sent to pathology and half was sent to Microbiology for aerobic and anaerobic culture.  The trephine was then used to obtain a 2nd biopsy from the talus.  This was done from the talar neck, angling beneath the Taylor dome again into the zone of edema visualized on the MRI.  This specimen was also divided in half and sent to pathology and microbiology.  The wound was again irrigated copiously.  The ankle joint capsule was repaired with simple sutures of 2-0 Vicryl.  The extensor retinaculum was repaired with simple sutures of 2-0 Vicryl.  Subcutaneous tissue was approximated with inverted simple  sutures of 3-0 Monocryl and a running 3-0 nylon was used to close the skin incision.  Sterile dressings were applied followed by compression wrap.  The patient was administered 2 g of Ancef intravenously after obtaining the biopsies.  The patient was then awakened from anesthesia and transported to the recovery room in stable condition.  FOLLOWUP PLAN:  The patient will be weightbearing as tolerated in a CAM walker boot on her right lower extremity.  She will follow up with me in clinic in 2 weeks for suture removal and to go for her biopsy results. I spoke with Dr. Tommy Medal of the [facility] for infectious disease. I reviewed this case with him and he requested that I have the patient follow up with him as soon as possible.     Wylene Simmer, MD     JH/MEDQ  D:  11/15/2013  T:  11/16/2013  Job:  [id number]  cc:   [name], MD

## 2013-11-16 NOTE — Assessment & Plan Note (Signed)
F/u Dr Doran Durand  At risk fo RBKA given  HIV and infection  Currently in boot and on antibiotics

## 2013-11-16 NOTE — Assessment & Plan Note (Signed)
No recurrence Continue antiplatelet Rx  F/U Leonie Man  Will have EP see today to show her how to send data from loop recorder

## 2013-11-16 NOTE — Telephone Encounter (Signed)
I called the patient and left a detailed message asking her to call back and schedule and appointment early next week.

## 2013-11-16 NOTE — Patient Instructions (Addendum)
Your physician recommends that you schedule a follow-up appointment in: 6 MONTHS WITH DR NISHAN  Your physician recommends that you continue on your current medications as directed. Please refer to the Current Medication list given to you today. 

## 2013-11-16 NOTE — Assessment & Plan Note (Signed)
Cholesterol is at goal.  Continue current dose of statin and diet Rx.  No myalgias or side effects.  F/U  LFT's in 6 months. Lab Results  Component Value Date   LDLCALC 121* 09/15/2013

## 2013-11-16 NOTE — Anesthesia Postprocedure Evaluation (Signed)
  Anesthesia Post-op Note  Patient: [name]  Procedure(s) Performed: Procedure(s): RIGHT ANKLE SYNOVIAL AND BONE BIOPSY (Right)  Patient Location: PACU  Anesthesia Type:General  Level of Consciousness: awake  Airway and Oxygen Therapy: Patient Spontanous Breathing  Post-op Pain: mild  Post-op Assessment: Post-op Vital signs reviewed, Patient's Cardiovascular Status Stable, Respiratory Function Stable, Patent Airway, No signs of Nausea or vomiting and Pain level controlled  Post-op Vital Signs: Reviewed and stable  Complications: No apparent anesthesia complications

## 2013-11-16 NOTE — Assessment & Plan Note (Signed)
No clinical CHF  Stable on meds.  Will see back in 6 months  May be a candidate for AICD in future given CAD and low EF but not with diagnosis of HIV and osteomyelitis

## 2013-11-16 NOTE — Assessment & Plan Note (Signed)
Well controlled.  Continue current medications and low sodium Dash type diet.    

## 2013-11-16 NOTE — Assessment & Plan Note (Signed)
Significant issue regarding immune system and ability to heal bone infection  Tissue cultures pending from tibial biopsy done by Dr Doran Durand this week Continue current antibiotics

## 2013-11-16 NOTE — Progress Notes (Signed)
Patient seen today for weight loss/metabolic syndrome. She has had to stop the Toprimax and is now wheelchair bound due to foot wound.  She continues to verbalize motivation to make lifestyle changes.   Last visit June 2014- 8 months ago 1. Ht     62" stated      Wt     238#     BMI: 43.5- class III obesity  Wt Change since last visit: loss of 22#-  2.Structured  Diet: not calorie counting, eating healthier and smaller portions limiting starches and fat, trying to eat less meat.  3. Labs- A1C 5.9%,  LDL 121 4. Exercise plan: type: chair exercises     Frequency:  daily      Duration: 30 minutes- recommended this 3-5 days a week    Intensity:  0   Titration:   Educated on increasing by 5-10 minutes per week    5. Behavior modification interventions: structured meal plan with food diary provided today, Goal weight established: 5-7% bodyweight- 218-220# 6. Assessment of Patient progress : 24 hour diet recall estimated intake ~ 1500 calories per day, estimated needs for weight loss  recommended 1300-1500 calories/day, weight loss has been rapid. Encouraged patient to keep track of weekly weight and activity totals.

## 2013-11-16 NOTE — Telephone Encounter (Signed)
Very good if she misses will need to be aggressive about getting her in

## 2013-11-16 NOTE — Progress Notes (Signed)
Patient ID: [name], female   DOB: [birth year], 53 y.o.   MRN: [id number] 53 yo patient previously seen by Dr Wynonia Lawman. History of CAD with stent at [facility] long time ago ? More recent stent at [facility] in 2008. No recent chest pain. In hospital 4/13  With TIA. TEE by Dr Stanford Breed with small PFO no SOE. Has been on good Rx for BP and cholesterol Dr Odie Sera office follows primary needs. Finished rehab and has mild Dysarthria. No chest pain, dyspnea palpitations. Just had monitor from [facility] taken off Placed by Leonie Man probably to R/O occult PAF. TEE showed   Implantable look by JA did not show PAF to date   Study Conclusions  - Left ventricle: Systolic function was severely reduced. The estimated ejection fraction was in the range of 25% to 30%. Akinesis of the anteroseptal myocardium. Akinesis of the distal inferior myocardium. Akinesis of the apical myocardium. - Aortic valve: Trivial regurgitation. - Mitral valve: Mild regurgitation. - Left atrium: The atrium was mildly dilated. No evidence of thrombus in the atrial cavity or appendage. - Atrial septum: No defect or patent foramen ovale was identified.  Hospitalized in November for osteomyelitis and had atypical pain. Seen by Dr Harrington Challenger who did not feel that anything was needed before discharge. 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 Biopsy of right ankle and is in pain today.  Very tearful about diagnosis of bone infection and needs to See ID Because of her HIV  Normal stress test 11/13    Echo 09/14/13 Study Conclusions  - Left ventricle: The cavity size was mildly dilated. Wall thickness was normal. The estimated ejection fraction was 25%. False chord in LV cavity. The mid to apical anteroseptal, inferoseptal, and anterior walls are akinetic. The apical inferior wall is akinetic. The apex is akinetic. Doppler parameters are consistent with abnormal left ventricular relaxation (grade 1  diastolic dysfunction). - Aortic valve: There was no stenosis. - Mitral valve: No significant regurgitation. - Left atrium: The atrium was mildly dilated. - Right ventricle: The cavity size was normal. Systolic function was normal. - Pulmonary arteries: No complete TR doppler jet so unable to estimate PA systolic pressure. - Inferior vena cava: The vessel was normal in size; the respirophasic diameter changes were in the normal range (= 50%); findings are consistent with normal central venous pressure. Impressions:  - Mildly dilated LV with EF 25%. Wall motion abnormality pattern consistent with prior MI in LAD territory. Normal RV size and systolic function. No significant valvular abnormalities.     ROS: Denies fever, malais, weight loss, blurry vision, decreased visual acuity, cough, sputum, SOB, hemoptysis, pleuritic pain, palpitaitons, heartburn, abdominal pain, melena, lower extremity edema, claudication, or rash.  All other systems reviewed and negative  General: Affect appropriate Tearful obese black female  HEENT: normal Neck supple with no adenopathy JVP normal no bruits no thyromegaly Lungs clear with no wheezing and good diaphragmatic motion Heart:  S1/S2 no murmur, no rub, gallop or click PMI normal Abdomen: benighn, BS positve, no tenderness, no AAA no bruit.  No HSM or HJR Distal pulses intact with no bruits RLE in boot from recent surgery  Neuro non-focal Skin warm and dry No muscular weakness   Current Outpatient Prescriptions  Medication Sig Dispense Refill  . albuterol (PROVENTIL HFA;VENTOLIN HFA) 108 (90 BASE) MCG/ACT inhaler Inhale 2 puffs into the lungs every 6 (six) hours as needed. For wheezing and shortness of  breath      . carvedilol (COREG) 3.125 MG tablet Take 1 tablet (3.125 mg total) by mouth 2 (two) times daily with a meal. 9am and 6pm  60 tablet  5  . cephALEXin (KEFLEX) 500 MG capsule Take 1 capsule (500 mg total) by mouth 4 (four) times  daily.  40 capsule  0  . clopidogrel (PLAVIX) 75 MG tablet Take 1 tablet (75 mg total) by mouth daily.  30 tablet  5  . diphenoxylate-atropine (LOMOTIL) 2.5-0.025 MG per tablet Take 1 tablet by mouth 4 (four) times daily as needed. For loose stool      . elvitegravir-cobicistat-emtricitabine-tenofovir (STRIBILD) 150-150-200-300 MG TABS tablet Take 1 tablet by mouth daily with breakfast.      . ferrous sulfate 325 (65 FE) MG tablet Take 1 tablet (325 mg total) by mouth 3 (three) times daily with meals.  90 tablet  1  . gabapentin (NEURONTIN) 400 MG capsule Take 1 capsule (400 mg total) by mouth 3 (three) times daily.  90 capsule  5  . isosorbide mononitrate (IMDUR) 60 MG 24 hr tablet Take 1 tablet (60 mg total) by mouth every evening. 5pm  30 tablet  5  . lisinopril (PRINIVIL,ZESTRIL) 20 MG tablet Take 1 tablet (20 mg total) by mouth daily.  30 tablet  1  . lubiprostone (AMITIZA) 24 MCG capsule Take 24 mcg by mouth 2 (two) times daily with a meal.      . morphine (MS CONTIN) 15 MG 12 hr tablet Take 1 tablet (15 mg total) by mouth every 12 (twelve) hours.  60 tablet  0  . Multiple Vitamin (MULTIVITAMIN WITH MINERALS) TABS Take 1 tablet by mouth daily.      . naproxen (NAPROSYN) 500 MG tablet Take 1 tablet (500 mg total) by mouth 2 (two) times daily as needed.  30 tablet  1  . naproxen sodium (ANAPROX) 220 MG tablet Take 440 mg by mouth 2 (two) times daily as needed (pain).      . nitroGLYCERIN (NITROSTAT) 0.3 MG SL tablet Place 0.3 mg under the tongue every 5 (five) minutes as needed. For chest pain      . oxyCODONE-acetaminophen (PERCOCET) 10-325 MG per tablet Take 1 tablet by mouth every 6 (six) hours as needed for pain.  120 tablet  0  . pantoprazole (PROTONIX) 20 MG tablet Take 40 mg by mouth daily.      . rosuvastatin (CRESTOR) 40 MG tablet Take 1 tablet (40 mg total) by mouth daily at 6 PM.  30 tablet  0  . topiramate (TOPAMAX) 100 MG tablet Take 200 mg by mouth at bedtime.       . traZODone  (DESYREL) 50 MG tablet Take 50 mg by mouth at bedtime.      . valACYclovir (VALTREX) 500 MG tablet Take 500 mg by mouth 2 (two) times daily as needed (flare ups).      . zolpidem (AMBIEN) 5 MG tablet Take 1 tablet (5 mg total) by mouth at bedtime as needed for sleep.  30 tablet  0   No current facility-administered medications for this visit.    Allergies  Atorvastatin  Electrocardiogram:  SR rate 84 poor R wave progression   Assessment and Plan

## 2013-11-19 LAB — TISSUE CULTURE
CULTURE: NO GROWTH
Culture: NO GROWTH
Culture: NO GROWTH
Gram Stain: NONE SEEN
Gram Stain: NONE SEEN

## 2013-11-20 ENCOUNTER — Ambulatory Visit (INDEPENDENT_AMBULATORY_CARE_PROVIDER_SITE_OTHER): Payer: Commercial Managed Care - HMO | Admitting: Infectious Disease

## 2013-11-20 ENCOUNTER — Encounter (HOSPITAL_COMMUNITY): Payer: Self-pay | Admitting: Orthopedic Surgery

## 2013-11-20 VITALS — BP 116/73 | HR 74 | Temp 98.4°F | Wt 236.0 lb

## 2013-11-20 DIAGNOSIS — E790 Hyperuricemia without signs of inflammatory arthritis and tophaceous disease: Secondary | ICD-10-CM

## 2013-11-20 DIAGNOSIS — M869 Osteomyelitis, unspecified: Secondary | ICD-10-CM

## 2013-11-20 DIAGNOSIS — B20 Human immunodeficiency virus [HIV] disease: Secondary | ICD-10-CM

## 2013-11-20 DIAGNOSIS — M86169 Other acute osteomyelitis, unspecified tibia and fibula: Secondary | ICD-10-CM

## 2013-11-20 DIAGNOSIS — R7989 Other specified abnormal findings of blood chemistry: Secondary | ICD-10-CM

## 2013-11-20 LAB — ANAEROBIC CULTURE
GRAM STAIN: NONE SEEN
Gram Stain: NONE SEEN

## 2013-11-20 NOTE — Progress Notes (Signed)
Subjective:    Patient ID: [name], female    DOB: 13-Nov-1960, 53 y.o.   MRN: [id number]  HPI  53 year old with HIV previously recently controlled on STRIBILD.  She has been suffering from "bone pain deep in side my foot."  She had MRI in 2013 which was read as:  Findings: The patient has diffuse subcutaneous edema of the  forefoot particularly on the dorsum of the foot. There is  hypertrophy of the synovium at the first metatarsal phalangeal  joint with osteoarthritis. After contrast administration there is  abnormal enhancement of the joint spaces at the fourth and fifth  tarsometatarsal joints and at the first metatarsal phalangeal  joint. No appreciable fluid collections. Findings are consistent  with synovitis of those joints. No evidence of osteomyelitis. No  visible soft tissue abscesses.  IMPRESSION: Synovitis at the fourth and fifth tarsometatarsal  joints and the first metatarsal phalangeal joint. Atypical  infection should be considered.  She had aspiration of the ankle by Dr Tamera Punt but the aspirate was a "dry tap" He did inject steroids. The patient felt better the following day  She was dc but apparently has continue to have pain in this foot.  Ultimately she had another MRI performed which Dr. 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   Dr Doran Durand to her to the OR last week for Bone biopsy to pathology and for cultures. I cannot tell if pt received any preoperative abx.  She was dc on keflex after Dr. Doran Durand and I discussed the case. We both agreed pt would need a BKA IF this was indeed osteomyelitis.  Interestingly pathology that is back does not show osteo or osteonecrosis but "benign bone."  Patient has had VL in the 1K + range and this is because until last Friday because she was "in so much pain and I didn't "give a --" about anything else."      Review of Systems  Constitutional: Positive for activity change.  Negative for fever, chills, diaphoresis, appetite change, fatigue and unexpected weight change.  HENT: Negative for congestion, rhinorrhea, sinus pressure, sneezing, sore throat and trouble swallowing.   Eyes: Negative for photophobia and visual disturbance.  Respiratory: Negative for cough, chest tightness, shortness of breath, wheezing and stridor.   Cardiovascular: Negative for chest pain, palpitations and leg swelling.  Gastrointestinal: Negative for nausea, vomiting, abdominal pain, diarrhea, constipation, blood in stool, abdominal distention and anal bleeding.  Genitourinary: Negative for dysuria, hematuria, flank pain and difficulty urinating.  Musculoskeletal: Positive for arthralgias, gait problem, joint swelling and myalgias. Negative for back pain.  Skin: Negative for color change, pallor, rash and wound.  Neurological: Negative for dizziness, tremors, weakness and light-headedness.  Hematological: Negative for adenopathy. Does not bruise/bleed easily.  Psychiatric/Behavioral: Positive for dysphoric mood. Negative for behavioral problems, confusion, sleep disturbance and agitation.       Objective:   Physical Exam  Nursing note and vitals reviewed. Constitutional: She is oriented to person, place, and time. She appears well-developed and well-nourished. No distress.  HENT:  Head: Normocephalic and atraumatic.  Mouth/Throat: Oropharynx is clear and moist. No oropharyngeal exudate.  Eyes: Conjunctivae and EOM are normal.  Neck: Normal range of motion. Neck supple.  Cardiovascular: Normal rate and regular rhythm.   Pulmonary/Chest: Effort normal. No respiratory distress. She has no wheezes.  Abdominal: Soft. She exhibits no distension.  Musculoskeletal: She exhibits no edema and no tenderness.  Feet:  Neurological: She is alert and oriented to person, place, and time. She exhibits normal muscle tone. Coordination normal.  Skin: Skin is warm and dry. She is not diaphoretic.  No erythema. No pallor.  Psychiatric: Her behavior is normal. Judgment and thought content normal. Her mood appears anxious. Her affect is labile. She exhibits a depressed mood.          Assessment & Plan:    #1 ? Osteomyelitis of talus and tibia:   Biopsies are NOT consistent with MRI findings. Cultures are negative.  Will need to review case with Dr Doran Durand. Could this indeed be a non-ID pathology. IN the past her ssx improved with steroids.  ? Need another biopsy  I spent greater than 40 minutes with the patient including greater than 50% of time in face to face counsel of the patient and in coordination of their care.  #2 HIV: resuming STRIBILD and will check VL in one month Pt is expressing wish to see me for her care due to frustration with her MS problem not having been more clearly elucidated.

## 2013-12-05 ENCOUNTER — Encounter: Payer: Self-pay | Admitting: *Deleted

## 2013-12-12 ENCOUNTER — Encounter: Payer: Self-pay | Admitting: Internal Medicine

## 2013-12-17 ENCOUNTER — Encounter: Payer: Medicare HMO | Admitting: Dietician

## 2013-12-19 ENCOUNTER — Encounter: Payer: Commercial Managed Care - HMO | Admitting: *Deleted

## 2013-12-19 ENCOUNTER — Ambulatory Visit (INDEPENDENT_AMBULATORY_CARE_PROVIDER_SITE_OTHER): Payer: Commercial Managed Care - HMO | Admitting: *Deleted

## 2013-12-19 DIAGNOSIS — I6350 Cerebral infarction due to unspecified occlusion or stenosis of unspecified cerebral artery: Secondary | ICD-10-CM

## 2013-12-24 ENCOUNTER — Ambulatory Visit: Payer: Commercial Managed Care - HMO | Admitting: Internal Medicine

## 2013-12-24 ENCOUNTER — Other Ambulatory Visit: Payer: Medicare HMO

## 2013-12-27 ENCOUNTER — Encounter: Payer: Self-pay | Admitting: Internal Medicine

## 2013-12-27 ENCOUNTER — Ambulatory Visit: Payer: Medicare HMO | Admitting: Dietician

## 2014-01-03 ENCOUNTER — Other Ambulatory Visit: Payer: Self-pay | Admitting: Internal Medicine

## 2014-01-08 ENCOUNTER — Encounter: Payer: Self-pay | Admitting: Internal Medicine

## 2014-01-08 ENCOUNTER — Ambulatory Visit: Payer: Medicare HMO | Admitting: Infectious Disease

## 2014-01-08 ENCOUNTER — Telehealth: Payer: Self-pay | Admitting: *Deleted

## 2014-01-08 NOTE — Telephone Encounter (Signed)
Left message for the pt about upcoming lab and MD appts at [place].

## 2014-01-16 ENCOUNTER — Other Ambulatory Visit: Payer: Commercial Managed Care - HMO

## 2014-01-16 DIAGNOSIS — B20 Human immunodeficiency virus [HIV] disease: Secondary | ICD-10-CM

## 2014-01-16 LAB — COMPLETE METABOLIC PANEL WITH GFR
ALBUMIN: 3.6 g/dL (ref 3.5–5.2)
ALK PHOS: 52 U/L (ref 39–117)
AST: 11 U/L (ref 0–37)
BILIRUBIN TOTAL: 0.4 mg/dL (ref 0.2–1.2)
BUN: 10 mg/dL (ref 6–23)
CO2: 29 mEq/L (ref 19–32)
Calcium: 9.1 mg/dL (ref 8.4–10.5)
Chloride: 105 mEq/L (ref 96–112)
Creat: 0.91 mg/dL (ref 0.50–1.10)
GFR, EST NON AFRICAN AMERICAN: 73 mL/min
GFR, Est African American: 84 mL/min
Glucose, Bld: 90 mg/dL (ref 70–99)
POTASSIUM: 3.9 meq/L (ref 3.5–5.3)
SODIUM: 141 meq/L (ref 135–145)
TOTAL PROTEIN: 7.4 g/dL (ref 6.0–8.3)

## 2014-01-16 LAB — CBC WITH DIFFERENTIAL/PLATELET
BASOS PCT: 1 % (ref 0–1)
Basophils Absolute: 0 10*3/uL (ref 0.0–0.1)
Eosinophils Absolute: 0.1 10*3/uL (ref 0.0–0.7)
Eosinophils Relative: 2 % (ref 0–5)
HCT: 36.9 % (ref 36.0–46.0)
HEMOGLOBIN: 11.9 g/dL — AB (ref 12.0–15.0)
LYMPHS ABS: 1.2 10*3/uL (ref 0.7–4.0)
Lymphocytes Relative: 30 % (ref 12–46)
MCH: 25.1 pg — ABNORMAL LOW (ref 26.0–34.0)
MCHC: 32.2 g/dL (ref 30.0–36.0)
MCV: 77.7 fL — ABNORMAL LOW (ref 78.0–100.0)
MONOS PCT: 10 % (ref 3–12)
Monocytes Absolute: 0.4 10*3/uL (ref 0.1–1.0)
NEUTROS PCT: 57 % (ref 43–77)
Neutro Abs: 2.3 10*3/uL (ref 1.7–7.7)
PLATELETS: 267 10*3/uL (ref 150–400)
RBC: 4.75 MIL/uL (ref 3.87–5.11)
RDW: 16.2 % — ABNORMAL HIGH (ref 11.5–15.5)
WBC: 4.1 10*3/uL (ref 4.0–10.5)

## 2014-01-18 LAB — T-HELPER CELL (CD4) - (RCID CLINIC ONLY)
CD4 % Helper T Cell: 36 % (ref 33–55)
CD4 T CELL ABS: 420 /uL (ref 400–2700)

## 2014-01-18 LAB — HIV-1 RNA ULTRAQUANT REFLEX TO GENTYP+
HIV 1 RNA Quant: <20 copies/mL (ref <20)
HIV-1 RNA Quant, Log: <1.3 {Log} (ref <1.30)

## 2014-01-22 LAB — HLA B*5701: HLA-B 5701 W/RFLX HLA-B HIGH: NEGATIVE

## 2014-01-23 LAB — HIV-1 INTEGRASE GENOTYPE

## 2014-01-25 NOTE — Addendum Note (Signed)
Addended by: Hulan Fray on: 01/25/2014 09:58 AM   Modules accepted: Orders

## 2014-01-30 ENCOUNTER — Ambulatory Visit (INDEPENDENT_AMBULATORY_CARE_PROVIDER_SITE_OTHER): Payer: Commercial Managed Care - HMO | Admitting: Infectious Disease

## 2014-01-30 ENCOUNTER — Encounter: Payer: Self-pay | Admitting: Infectious Disease

## 2014-01-30 VITALS — BP 113/84 | HR 92 | Temp 97.9°F | Wt 230.0 lb

## 2014-01-30 DIAGNOSIS — M25579 Pain in unspecified ankle and joints of unspecified foot: Secondary | ICD-10-CM

## 2014-01-30 DIAGNOSIS — M25559 Pain in unspecified hip: Secondary | ICD-10-CM

## 2014-01-30 DIAGNOSIS — M109 Gout, unspecified: Secondary | ICD-10-CM

## 2014-01-30 DIAGNOSIS — I10 Essential (primary) hypertension: Secondary | ICD-10-CM

## 2014-01-30 DIAGNOSIS — B20 Human immunodeficiency virus [HIV] disease: Secondary | ICD-10-CM

## 2014-01-30 LAB — SEDIMENTATION RATE: Sed Rate: 44 mm/hr — ABNORMAL HIGH (ref 0–22)

## 2014-01-30 LAB — URIC ACID: Uric Acid, Serum: 4.7 mg/dL (ref 2.4–7.0)

## 2014-01-30 LAB — C-REACTIVE PROTEIN: CRP: 0.9 mg/dL — ABNORMAL HIGH (ref <0.60)

## 2014-01-30 MED ORDER — ELVITEG-COBIC-EMTRICIT-TENOFDF 150-150-200-300 MG PO TABS: 1.0000 | ORAL_TABLET | Freq: Every day | ORAL | Status: DC

## 2014-01-30 MED ORDER — COLCHICINE 0.6 MG PO TABS: 0.3000 mg | ORAL_TABLET | Freq: Every day | ORAL | Status: DC

## 2014-01-30 MED ORDER — MUPIROCIN 2 % EX OINT: 1.0000 "application " | TOPICAL_OINTMENT | Freq: Two times a day (BID) | CUTANEOUS | Status: DC

## 2014-01-30 NOTE — Progress Notes (Signed)
Subjective:    Patient ID: [name], female    DOB: [birth year], 53 y.o.   MRN: [id number]  HPI   53 year old with HIV previously recently controlled on STRIBILD.  [name] has been suffering from "bone pain deep in side my foot."  [name] had MRI in 2013 which was read as:  Findings: The patient has diffuse subcutaneous edema of the  forefoot particularly on the dorsum of the foot. There is  hypertrophy of the synovium at the first metatarsal phalangeal  joint with osteoarthritis. After contrast administration there is  abnormal enhancement of the joint spaces at the fourth and fifth  tarsometatarsal joints and at the first metatarsal phalangeal  joint. No appreciable fluid collections. Findings are consistent  with synovitis of those joints. No evidence of osteomyelitis. No  visible soft tissue abscesses.  IMPRESSION: Synovitis at the fourth and fifth tarsometatarsal  joints and the first metatarsal phalangeal joint. Atypical  infection should be considered.  [name] had aspiration of the ankle by Dr Tamera Punt but the aspirate was a "dry tap" He did inject steroids. The patient felt better the following day  [name] was dc but apparently has continue to have pain in this foot.  Ultimately [name] had another MRI performed which Dr. 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   Dr Doran Durand to [name] to the OR last week for Bone biopsy to pathology and for cultures. I cannot tell if pt received any preoperative abx.  [name] was dc on keflex after Dr. Doran Durand and I discussed the case. We both agreed pt would need a BKA IF this was indeed osteomyelitis.  Interestingly pathology that is back does not show osteo or osteonecrosis but "benign bone."  Patient has had VL in the 1K + range and this is because until  March [name] was "in so much pain and I didn't "give a --" about anything else."  [name] is now with VL <20 and healthy CD4 count.  Unfortunately [name] right ankle  continues to hurt [name] and continues to swell when [name] bears weight. [name] was scheduled to see Dr. Doran Durand again in July but I have exhorted [name] to do so much sooner.  No fevers, chills malaise.      Review of Systems  Constitutional: Positive for activity change. Negative for fever, chills, diaphoresis, appetite change, fatigue and unexpected weight change.  HENT: Negative for congestion, rhinorrhea, sinus pressure, sneezing, sore throat and trouble swallowing.   Eyes: Negative for photophobia and visual disturbance.  Respiratory: Negative for cough, chest tightness, shortness of breath, wheezing and stridor.   Cardiovascular: Negative for chest pain, palpitations and leg swelling.  Gastrointestinal: Negative for nausea, vomiting, abdominal pain, diarrhea, constipation, blood in stool, abdominal distention and anal bleeding.  Genitourinary: Negative for dysuria, hematuria, flank pain and difficulty urinating.  Musculoskeletal: Positive for arthralgias, gait problem, joint swelling and myalgias. Negative for back pain.  Skin: Negative for color change, pallor, rash and wound.  Neurological: Negative for dizziness, tremors, weakness and light-headedness.  Hematological: Negative for adenopathy. Does not bruise/bleed easily.  Psychiatric/Behavioral: Positive for dysphoric mood. Negative for behavioral problems, confusion, sleep disturbance and agitation.       Objective:   Physical Exam  Nursing note and vitals reviewed. Constitutional: [name] is oriented to person, place, and time. [name] appears well-developed and well-nourished. No distress.  HENT:  Head: Normocephalic and atraumatic.  Mouth/Throat: Oropharynx is clear and moist. No oropharyngeal exudate.  Eyes: Conjunctivae  and EOM are normal.  Neck: Normal range of motion. Neck supple.  Cardiovascular: Normal rate and regular rhythm.   Pulmonary/Chest: Effort normal. No respiratory distress. [name] has no wheezes.  Abdominal: Soft. [name]  exhibits no distension.  Musculoskeletal: [name] exhibits no edema and no tenderness.       Feet:  Neurological: [name] is alert and oriented to person, place, and time. [name] exhibits normal muscle tone. Coordination normal.  Skin: Skin is warm and dry. [name] is not diaphoretic. No erythema. No pallor.  Psychiatric: [name] behavior is normal. Judgment and thought content normal. [name] mood appears anxious.   Surgical site: well healed but exquisitely tender to palpation medially           Assessment & Plan:    #1 ? Osteomyelitis of talus and tibia: with recurrent ankle pain, swelling better after wearing special boot, compression  Biopsies are NOT consistent with MRI findings. Cultures were  negative.. IN the past [name] ssx improved with steroids. [name] did also in the past have a slightly elevated uric acid  --will check ESR, CRP, uric acid today --will give trial of colchicine .20m (dose based on drug interaction with COBI in STRIBILD  --[name] needs to see Dr HDoran Durandagain and Need another biopsy  I spent greater than 25 minutes with the patient including greater than 50% of time in face to face counsel of the patient and in coordination of their care.  #2 HIV: continue STRIBILD and will check VL labs in 6 months time  #3 Higher uric acid in the past: my pharmacist suggested switch from ACE to ARB as losartan apparently can reduce uric acid levels as well

## 2014-02-07 ENCOUNTER — Other Ambulatory Visit: Payer: Self-pay | Admitting: *Deleted

## 2014-02-07 DIAGNOSIS — Z79899 Other long term (current) drug therapy: Secondary | ICD-10-CM

## 2014-02-07 NOTE — Telephone Encounter (Signed)
Appt has been scheduled w/Dr Eyvonne Mechanic at 1315PM.

## 2014-02-07 NOTE — Telephone Encounter (Signed)
Thanks

## 2014-02-07 NOTE — Telephone Encounter (Signed)
I have never seen pt. Last seen 11/2013 at which point MS Contin was added. She was to only use the percocet for breakthrough pain but has cont to use 120 percocet monthly in addition to her MSContin. UDS 11/2013 appropriate. One red flag 20124 for low pill count - daughter dropped them down sink. Pls ask her to make appt tomorrow with [facility] MD. She needs discussion about pain control, proper use of breakthrough meds, if still in that much pain, MSContin needs titrated up and percocet down. Can get Rx tomorrow at appt. Thanks

## 2014-02-08 ENCOUNTER — Ambulatory Visit (INDEPENDENT_AMBULATORY_CARE_PROVIDER_SITE_OTHER): Payer: Commercial Managed Care - HMO | Admitting: Internal Medicine

## 2014-02-08 ENCOUNTER — Ambulatory Visit: Payer: Commercial Managed Care - HMO | Admitting: Internal Medicine

## 2014-02-08 ENCOUNTER — Emergency Department (HOSPITAL_COMMUNITY)
Admission: EM | Admit: 2014-02-08 | Discharge: 2014-02-08 | Disposition: A | Discharge status: Home / Self Care | Payer: Medicare HMO | Attending: Emergency Medicine | Admitting: Emergency Medicine

## 2014-02-08 ENCOUNTER — Encounter: Payer: Self-pay | Admitting: Internal Medicine

## 2014-02-08 ENCOUNTER — Encounter (HOSPITAL_COMMUNITY): Payer: Self-pay | Admitting: Emergency Medicine

## 2014-02-08 ENCOUNTER — Emergency Department (HOSPITAL_COMMUNITY)
Admission: EM | Admit: 2014-02-08 | Discharge: 2014-02-08 | Disposition: A | Discharge status: Home / Self Care | Payer: Medicare HMO | Source: Home / Self Care

## 2014-02-08 ENCOUNTER — Telehealth: Payer: Self-pay | Admitting: Internal Medicine

## 2014-02-08 VITALS — BP 131/70 | HR 104 | Temp 98.7°F | Ht 62.0 in | Wt 228.4 lb

## 2014-02-08 DIAGNOSIS — M25476 Effusion, unspecified foot: Principal | ICD-10-CM | POA: Insufficient documentation

## 2014-02-08 DIAGNOSIS — M25473 Effusion, unspecified ankle: Secondary | ICD-10-CM | POA: Insufficient documentation

## 2014-02-08 DIAGNOSIS — Z79899 Other long term (current) drug therapy: Secondary | ICD-10-CM | POA: Diagnosis not present

## 2014-02-08 DIAGNOSIS — M25571 Pain in right ankle and joints of right foot: Secondary | ICD-10-CM

## 2014-02-08 DIAGNOSIS — M25579 Pain in unspecified ankle and joints of unspecified foot: Secondary | ICD-10-CM | POA: Diagnosis not present

## 2014-02-08 DIAGNOSIS — B20 Human immunodeficiency virus [HIV] disease: Secondary | ICD-10-CM

## 2014-02-08 MED ORDER — MORPHINE SULFATE ER 15 MG PO TBCR: 15.0000 mg | EXTENDED_RELEASE_TABLET | Freq: Two times a day (BID) | ORAL | Status: DC

## 2014-02-08 MED ORDER — OXYCODONE-ACETAMINOPHEN 10-325 MG PO TABS: 1.0000 | ORAL_TABLET | Freq: Four times a day (QID) | ORAL | Status: DC | PRN

## 2014-02-08 NOTE — Telephone Encounter (Signed)
   Reason for call:   I received a call from Ms. Sanam R Jedlicka at 7:16  AM indicating increased pain.   Pertinent Data:   increased pain after hitting her foot of which she currently has a surgical boot in place. Pt has appt at 1:15pm today in [facility], requesting earlier time slot for pain med refill.   Assessment / Plan / Recommendations:   Advised to come to clinic at 9:00 for 9:15am appt today 02/08/2014  As always, pt is advised that if symptoms worsen or new symptoms arise, they should go to an urgent care facility or to to ER for further evaluation.   Jeralene Huff, MD   02/08/2014, 7:27 AM

## 2014-02-08 NOTE — ED Notes (Signed)
Pager [phone]

## 2014-02-08 NOTE — Assessment & Plan Note (Addendum)
Patient was recently seen and evaluated by Dr. 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 Doran Durand of orthopedics that showed just normal bone tissue without acute inflammation or osteonecrosis. Therefore the patient was discontinued off of antibiotics at that time. The patient had initially responded to corticosteroid injections back in 2013 for several right ankle pain and synovitis and a history of elevated uric acid levels up to 8.1 Repeat levels were done at her 01/30/14 visit and uric acid level was 4. The patient presents today with increasing or worsening pain, unable to bear weight on the ankle, unable to walk even in the unna boot, and significant edema around the medial and lateral malleolus when compared to pictures taken during the 01/31/12 visit. A personal phone call was made to Dr. Drucilla Schmidt fo infectious disease in regards to management and he recommended that if the patient did not respond to the colchicine that it was maybe unlikely 2/2 gout and an acute septic arthritis would still be on the differential. That being said he didn't recommend starting antibiotics given the history of negative cultures and previous negative bone biopsy until an aspirate could be obtained. Therefore his ultimate recommendation would be joint aspiration before any administration of antibiotics and recommended communication with Dr. Nona Dell office. A subsequent call was made by this provider to Dr. Nona Dell office to schedule an urgent appointment for joint aspiration given the above progression of the patient's symptoms. This provider was informed then that no available visits could be scheduled today with Dr. Doran Durand and that there was not an urgent care option at the orthopedics clinic. A request to speak to Dr. Doran Durand personally was made and the secretary took down this  provider's information. This provider didn't receive a call back and the patient was still in excruciating pain. Therefore discussion with the patient was had and the decision was made to proceed to the emergency room for joint aspiration and possible MRI imaging given the collected concern of Dr. Drucilla Schmidt and this provider for ruling out septic arthritis. Therefore the patient was transported to the ED for evaluation and management.

## 2014-02-08 NOTE — ED Notes (Signed)
Information on wrong pt. Disregard previous charting.

## 2014-02-08 NOTE — Progress Notes (Signed)
Subjective:    Patient ID: [name], female    DOB: 11-20-1960, 53 y.o.   MRN: [id number]  HPI [name] is a 53 yo woman pmh as listed below present for increasing foot pain.   Pt was recently seen in [place] clinic for ongoing concern of OM of her right foot. There seems to be some inconsistency between MRI readings that show possible OM and benign bone bx. It was recommended at that time that a sooner appt be made to see Dr. Jhonnie Garner of orthopedics for repeat bx. Pt was given a trial of colchicine to see if any component of gout (in hx of increased uric acid before) would provide relief for the pain. The patient reports that this did not improve any of her symptoms. She states that she had pain ever since her visit on 01/30/14 with Dr. Drucilla Schmidt but has become increasingly worse within the past week and now is accompanied by edema and severe tenderness to palpation and very limited range of motion elicits extreme pain. She does continue to keep him in a boot but is now not able to ambulate on the ankle as she did before. She does not report any subjective fevers or chills, nausea/vomiting/diarrhea, rashes, or ulcerations.  Past Medical History  Diagnosis Date  . Cholecystitis     Gall bladder removed   . Hypercholesterolemia   . Fibroids   . HIV (human immunodeficiency virus infection) 05/27/11    CD4 460, VL undetectable 10/12/12  . Pelvic pain in female 10/14/11  . PMB (postmenopausal bleeding)     Since 2010  . Increased BMI 05/27/11  . Anxiety   . Depression   . Hypertension   . H/O varicella   . History of measles, mumps, or rubella   . Blood transfusion 1995    "related to losing blood in urine during pregnancy"  . Hypothyroidism     Reports h/o hypothyroidism; not on any meds, TSH wnl over several years  . 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 Myocardial infarction 07/2004  . COPD (chronic obstructive  pulmonary disease)     well controlled, only using albuterol inhaler occasionally   . Iron deficiency anemia   . Lower GI bleed 04/24/2012    from hemorrhoids. No recent colonoscopy   . CAD in native artery     s/p stent and restenting of LAD. On plavix  . Anginal pain   . Pneumonia 1980's    "once"  . Chronic bronchitis     "get it q yr" (09/14/2013)  . Stroke 12/2011    R insular ischemic CVA ; residual "speech messes up when I get sick or real tired" (09/14/2013)  . Arthritis     "knees; left shoulder; right anklef/foot" (09/14/2013)  . Bursitis, shoulder     "right"  . Chronic lower back pain   . Carpal tunnel syndrome, bilateral     wears slint bilateral   Current Outpatient Prescriptions on File Prior to Visit  Medication Sig Dispense Refill  . albuterol (PROVENTIL HFA;VENTOLIN HFA) 108 (90 BASE) MCG/ACT inhaler Inhale 2 puffs into the lungs every 6 (six) hours as needed. For wheezing and shortness of breath      . carvedilol (COREG) 3.125 MG tablet Take 1 tablet (3.125 mg total) by mouth 2 (two) times daily with a meal. 9am and 6pm  60 tablet  5  . cephALEXin (  KEFLEX) 500 MG capsule Take 1 capsule (500 mg total) by mouth 4 (four) times daily.  40 capsule  0  . clopidogrel (PLAVIX) 75 MG tablet Take 1 tablet (75 mg total) by mouth daily.  30 tablet  5  . colchicine 0.6 MG tablet Take 0.5 tablets (0.3 mg total) by mouth daily.  30 tablet  1  . diphenoxylate-atropine (LOMOTIL) 2.5-0.025 MG per tablet Take 1 tablet by mouth 4 (four) times daily as needed. For loose stool      . elvitegravir-cobicistat-emtricitabine-tenofovir (STRIBILD) 150-150-200-300 MG TABS tablet Take 1 tablet by mouth daily with breakfast.  30 tablet  11  . ferrous sulfate 325 (65 FE) MG tablet Take 1 tablet (325 mg total) by mouth 3 (three) times daily with meals.  90 tablet  1  . gabapentin (NEURONTIN) 400 MG capsule Take 1 capsule (400 mg total) by mouth 3 (three) times daily.  90 capsule  5  . isosorbide  mononitrate (IMDUR) 60 MG 24 hr tablet Take 1 tablet (60 mg total) by mouth every evening. 5pm  30 tablet  5  . lisinopril (PRINIVIL,ZESTRIL) 20 MG tablet Take 1 tablet (20 mg total) by mouth daily.  30 tablet  1  . lubiprostone (AMITIZA) 24 MCG capsule Take 1 capsule (24 mcg total) by mouth daily with breakfast.  30 capsule  2  . morphine (MS CONTIN) 15 MG 12 hr tablet Take 1 tablet (15 mg total) by mouth every 12 (twelve) hours.  60 tablet  0  . Multiple Vitamin (MULTIVITAMIN WITH MINERALS) TABS Take 1 tablet by mouth daily.      . mupirocin ointment (BACTROBAN) 2 % Place 1 application into the nose 2 (two) times daily.  22 g  0  . naproxen (NAPROSYN) 500 MG tablet Take 1 tablet (500 mg total) by mouth 2 (two) times daily as needed.  30 tablet  1  . naproxen sodium (ANAPROX) 220 MG tablet Take 440 mg by mouth 2 (two) times daily as needed (pain).      . nitroGLYCERIN (NITROSTAT) 0.3 MG SL tablet Place 0.3 mg under the tongue every 5 (five) minutes as needed. For chest pain      . oxyCODONE-acetaminophen (PERCOCET) 10-325 MG per tablet Take 1 tablet by mouth every 6 (six) hours as needed for pain.  120 tablet  0  . pantoprazole (PROTONIX) 20 MG tablet Take 40 mg by mouth daily.      . rosuvastatin (CRESTOR) 40 MG tablet Take 1 tablet (40 mg total) by mouth daily at 6 PM.  30 tablet  0  . topiramate (TOPAMAX) 100 MG tablet Take 200 mg by mouth at bedtime.       . traZODone (DESYREL) 50 MG tablet Take 50 mg by mouth at bedtime.      . valACYclovir (VALTREX) 500 MG tablet TAKE 1 TABLET (500MG ) BY  MOUTH TWICE DAILY FOR     FLARE UPS  60 tablet  0  . zolpidem (AMBIEN) 5 MG tablet Take 1 tablet (5 mg total) by mouth at bedtime as needed for sleep.  30 tablet  0   No current facility-administered medications on file prior to visit.   Social, surgical, family history reviewed with patient and updated in appropriate chart locations.   Review of Systems Review of Systems - History obtained from chart  review and the patient General ROS: negative for - chills, fever or night sweats Respiratory ROS: no cough, shortness of breath, or wheezing Cardiovascular ROS: no  chest pain or dyspnea on exertion Gastrointestinal ROS: no abdominal pain, change in bowel habits, or black or bloody stools Musculoskeletal ROS: positive for - joint pain, joint swelling, muscle pain and swelling in ankle - right negative for - muscle pain or muscular weakness Neurological ROS: negative for - bowel and bladder control changes, impaired coordination/balance, numbness/tingling or weakness Dermatological ROS: negative for rash and skin lesion changes     Objective:   Physical Exam Filed Vitals:   02/08/14 0930  BP: 131/70  Pulse: 104  Temp: 98.7 F (37.1 C)   General: sitting in chair, in some distress, very tearful HEENT: PERRL, EOMI, no scleral icterus Cardiac: RRR, no rubs, murmurs or gallops Pulm: clear to auscultation bilaterally, moving normal volumes of air Abd: soft, nontender, nondistended, BS present Ext: warm and well perfused, no pedal edema MSK: limited rom in right ankle 2/2 pain, unable to dorsiflex or evert ankle on the right, palpable edema/effusion around medial malleous and lateral malellous on the right, ttp over dorsum of foot, well healed incision, left ankle w/o erythema or edema and FROM, unable to bear weight on the right leg 2/2 ankle pain Neuro: alert and oriented X3, cranial nerves II-XII grossly intact     Assessment & Plan:  Please see problem oriented charting  Pt discussed with Dr. Stann Mainland

## 2014-02-08 NOTE — ED Notes (Signed)
Pt has previous R ankle injury from 2013. Was admitted for infection. Recent surgery in March 2015 due to pain. Pt seen by [facility] and got MRI that determined infection in bone. Biopsy performed stated that not infected. Pt is on pain management regime. Was sent here for further eval due to swelling.

## 2014-02-08 NOTE — Patient Instructions (Signed)
General Instructions:   Please try to bring all your medicines next time. This will help [place] keep you safe from mistakes.   Progress Toward Treatment Goals:  Treatment Goal 11/08/2013  Blood pressure at goal  Prevent falls -    Self Care Goals & Plans:  Self Care Goal 02/08/2014  Manage my medications take my medicines as prescribed; bring my medications to every visit; refill my medications on time  Monitor my health -  Eat healthy foods drink diet soda or water instead of juice or soda; eat more vegetables; eat foods that are low in salt; eat baked foods instead of fried foods; eat fruit for snacks and desserts  Other -    No flowsheet data found.   Care Management & Community Referrals:  Referral 11/08/2013  Referrals made for care management support nutritionist  Referrals made to community resources -

## 2014-02-08 NOTE — ED Notes (Signed)
Dr. Nona Dell office can see pt now in office; will let pt know.

## 2014-02-08 NOTE — Assessment & Plan Note (Addendum)
Pt was given usual pain medications per contract agreed upon with her PCP.

## 2014-02-08 NOTE — ED Notes (Signed)
Pt currently on phone with Dr [facility] office and states she wants to be seen there. They will see her at 3:00. RN made pt aware that decision is up to her and we are more than happy to see her here. Pt states he has been following her ankle issues and it makes sense to see him. Pt will be wheeled to main entrance to get car.

## 2014-02-11 NOTE — Progress Notes (Signed)
Case discussed with Dr. Sadek soon after the resident saw the patient.  We reviewed the resident's history and exam and pertinent patient test results.  I agree with the assessment, diagnosis, and plan of care documented in the resident's note. 

## 2014-02-14 ENCOUNTER — Ambulatory Visit (INDEPENDENT_AMBULATORY_CARE_PROVIDER_SITE_OTHER): Payer: Commercial Managed Care - HMO | Admitting: *Deleted

## 2014-02-14 DIAGNOSIS — I639 Cerebral infarction, unspecified: Secondary | ICD-10-CM

## 2014-02-14 DIAGNOSIS — I6350 Cerebral infarction due to unspecified occlusion or stenosis of unspecified cerebral artery: Secondary | ICD-10-CM

## 2014-02-28 ENCOUNTER — Ambulatory Visit: Payer: Commercial Managed Care - HMO | Admitting: *Deleted

## 2014-03-01 LAB — MDC_IDC_ENUM_SESS_TYPE_REMOTE

## 2014-03-01 NOTE — Progress Notes (Signed)
Loop recorder 

## 2014-03-05 ENCOUNTER — Other Ambulatory Visit: Payer: Self-pay | Admitting: *Deleted

## 2014-03-05 DIAGNOSIS — Z79899 Other long term (current) drug therapy: Secondary | ICD-10-CM

## 2014-03-05 NOTE — Telephone Encounter (Signed)
Pharmacy called and last refill on percocet was 6/12                                                         Morphine was 6/5  Last office visit was 6/5  Note from that visit states "Pt was given usual pain medications per contract agreed upon with her PCP."

## 2014-03-06 MED ORDER — MORPHINE SULFATE ER 15 MG PO TBCR: 15.0000 mg | EXTENDED_RELEASE_TABLET | Freq: Two times a day (BID) | ORAL | Status: DC

## 2014-03-06 MED ORDER — OXYCODONE-ACETAMINOPHEN 10-325 MG PO TABS: 1.0000 | ORAL_TABLET | Freq: Four times a day (QID) | ORAL | Status: DC | PRN

## 2014-03-06 NOTE — Telephone Encounter (Signed)
I have never seen her. I printed off 2 months worth. Pls sch F/U appt in next 60 days with PCP.

## 2014-03-06 NOTE — Telephone Encounter (Signed)
Message sent to front dest to schedule OV with Dr Software engineer

## 2014-03-06 NOTE — Telephone Encounter (Signed)
Pt called X 2 and her phone is not accepting calls  Rx ready for pick up

## 2014-03-07 ENCOUNTER — Ambulatory Visit: Payer: Commercial Managed Care - HMO | Admitting: *Deleted

## 2014-03-07 ENCOUNTER — Encounter (HOSPITAL_COMMUNITY): Payer: Self-pay | Admitting: *Deleted

## 2014-03-07 ENCOUNTER — Encounter (HOSPITAL_COMMUNITY): Payer: Self-pay

## 2014-03-07 ENCOUNTER — Other Ambulatory Visit (HOSPITAL_COMMUNITY): Payer: Self-pay

## 2014-03-07 ENCOUNTER — Encounter (HOSPITAL_COMMUNITY)
Admission: RE | Admit: 2014-03-07 | Discharge: 2014-03-07 | Disposition: A | Discharge status: Home / Self Care | Payer: Medicare HMO | Source: Ambulatory Visit | Attending: Orthopedic Surgery | Admitting: Orthopedic Surgery

## 2014-03-07 DIAGNOSIS — F14120 Cocaine abuse with intoxication, uncomplicated: Secondary | ICD-10-CM

## 2014-03-07 LAB — CBC
HCT: 38.2 % (ref 36.0–46.0)
Hemoglobin: 12 g/dL (ref 12.0–15.0)
MCH: 25.4 pg — ABNORMAL LOW (ref 26.0–34.0)
MCHC: 31.4 g/dL (ref 30.0–36.0)
MCV: 80.8 fL (ref 78.0–100.0)
PLATELETS: 243 10*3/uL (ref 150–400)
RBC: 4.73 MIL/uL (ref 3.87–5.11)
RDW: 15.7 % — AB (ref 11.5–15.5)
WBC: 4.4 10*3/uL (ref 4.0–10.5)

## 2014-03-07 LAB — COMPREHENSIVE METABOLIC PANEL
ALBUMIN: 3.5 g/dL (ref 3.5–5.2)
ALK PHOS: 56 U/L (ref 39–117)
ALT: 10 U/L (ref 0–35)
AST: 16 U/L (ref 0–37)
Anion gap: 14 (ref 5–15)
BUN: 7 mg/dL (ref 6–23)
CO2: 26 mEq/L (ref 19–32)
Calcium: 9.3 mg/dL (ref 8.4–10.5)
Chloride: 101 mEq/L (ref 96–112)
Creatinine, Ser: 0.83 mg/dL (ref 0.50–1.10)
GFR calc Af Amer: >90 mL/min (ref >90)
GFR calc non Af Amer: 80 mL/min — ABNORMAL LOW (ref >90)
Glucose, Bld: 108 mg/dL — ABNORMAL HIGH (ref 70–99)
POTASSIUM: 3.4 meq/L — AB (ref 3.7–5.3)
Sodium: 141 mEq/L (ref 137–147)
TOTAL PROTEIN: 7.9 g/dL (ref 6.0–8.3)
Total Bilirubin: 0.5 mg/dL (ref 0.3–1.2)

## 2014-03-07 NOTE — Pre-Procedure Instructions (Addendum)
[name]  03/07/2014   Your procedure is scheduled on:  July 9th at 3pm  Report to [place] at100pm  Call this number if you have problems the morning of surgery: [phone]   Remember:   Do not eat food or drink liquids after midnight.   Take these medicines the morning of surgery with A SIP OF WATER: Albuterol inhaler if needed, Coreg, Gabapentin, Ms Contin, Protonix Stop taking Plavix as directed by your Dr.  Stop taking aspirin, Aleve, Ibuprofen, BC's, Goody's, Herbal medications, and Fish Oil   Do not wear jewelry, make-up or nail polish.  Do not wear lotions, powders, or perfumes. You may wear deodorant.  Do not shave 48 hours prior to surgery. Men may shave face and neck.  Do not bring valuables to the hospital.  [facility] is not responsible for any belongings or valuables.               Contacts, dentures or bridgework may not be worn into surgery.  Leave suitcase in the car. After surgery it may be brought to your room.  For patients admitted to the hospital, discharge time is determined by your                treatment team.               Patients discharged the day of surgery will not be allowed to drive  home.    Special Instructions: [name] - Preparing for Surgery  Before surgery, you can play an important role.  Because skin is not sterile, your skin needs to be as free of germs as possible.  You can reduce the number of germs on you skin by washing with CHG (chlorahexidine gluconate) soap before surgery.  CHG is an antiseptic cleaner which kills germs and bonds with the skin to continue killing germs even after washing.  Please DO NOT use if you have an allergy to CHG or antibacterial soaps.  If your skin becomes reddened/irritated stop using the CHG and inform your nurse when you arrive at Short Stay.  Do not shave (including legs and underarms) for at least 48 hours prior to the first CHG shower.  You may shave your  face.  Please follow these instructions carefully:   1.  Shower with CHG Soap the night before surgery and the                                morning of Surgery.  2.  If you choose to wash your hair, wash your hair first as usual with your       normal shampoo.  3.  After you shampoo, rinse your hair and body thoroughly to remove the                      Shampoo.  4.  Use CHG as you would any other liquid soap.  You can apply chg directly       to the skin and wash gently with scrungie or a clean washcloth.  5.  Apply the CHG Soap to your body ONLY FROM THE NECK DOWN.        Do not use on open wounds or open sores.  Avoid contact with your eyes,       ears, mouth and genitals (private parts).  Wash genitals (private parts)  with your normal soap.  6.  Wash thoroughly, paying special attention to the area where your surgery        will be performed.  7.  Thoroughly rinse your body with warm water from the neck down.  8.  DO NOT shower/wash with your normal soap after using and rinsing off       the CHG Soap.  9.  Pat yourself dry with a clean towel.            10.  Wear clean pajamas.            11.  Place clean sheets on your bed the night of your first shower and do not        sleep with pets.  Day of Surgery  Do not apply any lotions/deoderants the morning of surgery.  Please wear clean clothes to the hospital/surgery center.      Please read over the following fact sheets that you were given: Pain Booklet, Coughing and Deep Breathing and Surgical Site Infection Prevention

## 2014-03-07 NOTE — Progress Notes (Signed)
Patient ID: [name], female   DOB: 01/07/1961, 53 y.o.   MRN: [id number]  COUNSELOR MET WITH [name] TODAY.  CLIENT WAS EMOTIONAL AND TEARFUL THROUGHOUT SESSION.  CLIENT COMMUNICATED THAT SHE HAS NUMEROUS HEALTH PROBLEMS FROM HEART ATTACKS TO [place] AND PRESENTLY PROBLEMS WITH HER RIGHT FOOT.  CLIENT COMMUNICATED THAT SHE IS VERY FRUSTRATED OVER NOT BEING ABLE TO GET A HANDLE ON WHAT IS GOING ON WITH HER FOOT.  CLIENT STATED THAT SHE HAS BEEN DEALING WITH HER FOOT ISSUE FOR CLOSE TO TWO YEARS NOW AND SHE IS NO FURTHER AHEAD ON KNOWING EXACTLY WHAT IS WRONG WITH IT AS WELL AS GETTING IT COMPLETELY TAKEN CARE OF.  CLIENT SHARED THAT SHE IS CURRENTLY TAKING PERCOCET AND MORPHINE FOR PAIN FOR THE FOOT.   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  CLIENT STATES THAT SHE USED CRACK COCAINE FOR ABOUT 15-20 YEARS OFF AND ON DEPENDING ON THE MONEY SITUATION. CLIENT STATES THAT SHE RELAPSED BACK IN APRIL 2015 ON CRACK COCAINE AFTER THREE YEARS OF TOTAL ABSTINENCE.  CLIENT HAS COMPLETED BOTH INPATIENT AS WELL AS OUTPATIENT TREATMENT FOR HER SUBSTANCE ABUSE PROBLEM OVER THE YEARS.  CLIENT INDICATED THAT SHE DECIDED TO GET CLEAN AND STAYED CLEAN DUE TO HER OWN DETERMINATION. CLIENT STATED THAT THIS RELAPSE WAS PARTLY DUE TO DEALING WITH THE ONGOING MEDICAL PROBLEMS SHE IS EXPERIENCING IN HER LIFE. CLIENT MADE THE NEXT APPOINTMENT TO SEE COUNSELOR ON REGULAR BASIS.  JODI HERRING, LPCA, MA [facility]

## 2014-03-07 NOTE — Progress Notes (Signed)
PCP is Dr Larey Dresser Cardiologist is Dr Cathie Olden. Pt states that she had a card cath in 2008 Echo noted in epic 09-14-13 CXR and EKG noted in epic from 10-02-13

## 2014-03-11 ENCOUNTER — Ambulatory Visit (HOSPITAL_COMMUNITY): Payer: Medicare HMO | Admitting: Certified Registered"

## 2014-03-11 ENCOUNTER — Encounter (HOSPITAL_COMMUNITY): Payer: Medicare HMO | Admitting: Vascular Surgery

## 2014-03-11 NOTE — Progress Notes (Signed)
Anesthesia Chart Review: Patient is a 53 year old female posted for right ankle open arthrotomy and biopsy on 03/14/14 by Dr. Doran Durand.  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 BMI is 41.28 consistent with morbid obesity. "Sensitive" notes in Epic indicate a relapse of cocaine use in 12/2013 (following 3 years of abstinence), but is determined to stay clean again.   PCP is with [facility].  She is also followed by [facility] ID department. Cardiologist is Dr. Johnsie Cancel, last visit 11/16/13 with continued medical therapy recommended. 11/12/13 Loop recorder encounter summary states, "..Normal device function. No episodes recorded..."  Her last two EKG from 10/02/13 and 09/14/13 noted in Epic. They show SR, old anteroseptal infarct, non-specific T wave abnormalities. There is question of minimal ST elevation in inferior leads on 10/02/13, but there is also artifact in these leads making interpretation difficult. She has known anterolateral T wave abnormality (and more non-specific inferior T wave abnormality) dating back to at least 07/31/12.   Echo on 09/14/13 showed: Mildly dilated LV with EF 25%. Wall motion abnormality pattern consistent with prior MI in LAD territory. Normal RV size and systolic function. No significant valvular  abnormalities. (EF 12/21/11 by TEE was 25-30%. No felt to be a good ICD candidate due to HIV and osteomyelitis.)  Nuclear stress test on 08/02/12 showed:  1. Large infarct involving the apical segments and apex. 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 No reversible defects are present.  2. Global hypokinesia and apical dyskinesia.  3. Left ventricular ejection fraction equals 36%.  Results were felt unchanged from previous testing, so continued medical therapy was recommended.   According to 06/23/07 consult note by Dr. Kathrynn Humble, cardiac cath at [facility] in 06/2007 (in the setting of NSTEMI) "showed normal left main, left circumflex, and RCA. Her LAD was totally occluded within the stent. There were good right to left and left to left collaterals. The decision was made to treat her medically as this was thought to be a chronic total occlusion."   Event monitor in 04/2012 showed NSR.   Carotid duplex on 09/14/13 showed: - The vertebral arteries appear patent with antegrade flow. - Findings consistent with1-39 percent stenosis involving the right internal carotid artery and the left internal carotid artery.  CXR on 10/02/13 showed cardiomegaly without acute process.   Preoperative labs noted.   I reviewed above with anesthesiologist Dr. Albertina Parr.  Patient with on-going ankle issues with recent surgery and cardiology follow-up in 11/2013.  Her CHF and CAD were felt stable at that time.  If no acute changes or new CV/CHF symptoms then it is anticipated that she can proceed as planned from an anesthesia standpoint.  Would also want to confirm that she has had no further drug relapse. I have left a voicemail for patient to contact me, but have not yet received a return call.  Of note, there is no documentation of when she was to stop her  Plavix.  (Cardiology deferred decision to her neurologist Dr. Leonie Man prior to her 11/2013 surgery.  Dr. Leonie Man gave permission to hold 5 days prior to surgery then.)   I spoke with Claiborne Billings at Dr. Nona Dell office.  She will contact patient to ensure that Plavix has been held, and if not she will review with Dr. Doran Durand, to see if he feels surgery would need to be postponed.    George Hugh [facility] Short Stay Center/Anesthesiology Phone [phone] 03/11/2014 3:36 PM

## 2014-03-12 ENCOUNTER — Encounter (HOSPITAL_COMMUNITY): Payer: Self-pay | Admitting: Emergency Medicine

## 2014-03-12 ENCOUNTER — Emergency Department (HOSPITAL_COMMUNITY)
Admission: EM | Admit: 2014-03-12 | Discharge: 2014-03-12 | Disposition: A | Discharge status: Home / Self Care | Payer: Medicare HMO | Attending: Emergency Medicine | Admitting: Emergency Medicine

## 2014-03-12 DIAGNOSIS — Z87891 Personal history of nicotine dependence: Secondary | ICD-10-CM | POA: Insufficient documentation

## 2014-03-12 DIAGNOSIS — M25531 Pain in right wrist: Secondary | ICD-10-CM

## 2014-03-12 DIAGNOSIS — Z8673 Personal history of transient ischemic attack (TIA), and cerebral infarction without residual deficits: Secondary | ICD-10-CM | POA: Insufficient documentation

## 2014-03-12 DIAGNOSIS — Z8701 Personal history of pneumonia (recurrent): Secondary | ICD-10-CM | POA: Insufficient documentation

## 2014-03-12 DIAGNOSIS — Z7902 Long term (current) use of antithrombotics/antiplatelets: Secondary | ICD-10-CM | POA: Insufficient documentation

## 2014-03-12 DIAGNOSIS — F3289 Other specified depressive episodes: Secondary | ICD-10-CM | POA: Insufficient documentation

## 2014-03-12 DIAGNOSIS — I252 Old myocardial infarction: Secondary | ICD-10-CM | POA: Insufficient documentation

## 2014-03-12 DIAGNOSIS — I509 Heart failure, unspecified: Secondary | ICD-10-CM | POA: Insufficient documentation

## 2014-03-12 DIAGNOSIS — Z9889 Other specified postprocedural states: Secondary | ICD-10-CM | POA: Insufficient documentation

## 2014-03-12 DIAGNOSIS — Z8619 Personal history of other infectious and parasitic diseases: Secondary | ICD-10-CM | POA: Insufficient documentation

## 2014-03-12 DIAGNOSIS — D509 Iron deficiency anemia, unspecified: Secondary | ICD-10-CM | POA: Insufficient documentation

## 2014-03-12 DIAGNOSIS — E669 Obesity, unspecified: Secondary | ICD-10-CM | POA: Insufficient documentation

## 2014-03-12 DIAGNOSIS — Z8739 Personal history of other diseases of the musculoskeletal system and connective tissue: Secondary | ICD-10-CM | POA: Insufficient documentation

## 2014-03-12 DIAGNOSIS — G8929 Other chronic pain: Secondary | ICD-10-CM | POA: Insufficient documentation

## 2014-03-12 DIAGNOSIS — I2510 Atherosclerotic heart disease of native coronary artery without angina pectoris: Secondary | ICD-10-CM | POA: Insufficient documentation

## 2014-03-12 DIAGNOSIS — E7800 Pure hypercholesterolemia, unspecified: Secondary | ICD-10-CM | POA: Insufficient documentation

## 2014-03-12 DIAGNOSIS — F411 Generalized anxiety disorder: Secondary | ICD-10-CM | POA: Insufficient documentation

## 2014-03-12 DIAGNOSIS — Z9089 Acquired absence of other organs: Secondary | ICD-10-CM | POA: Insufficient documentation

## 2014-03-12 DIAGNOSIS — Z862 Personal history of diseases of the blood and blood-forming organs and certain disorders involving the immune mechanism: Secondary | ICD-10-CM | POA: Insufficient documentation

## 2014-03-12 DIAGNOSIS — M25539 Pain in unspecified wrist: Secondary | ICD-10-CM | POA: Insufficient documentation

## 2014-03-12 DIAGNOSIS — M25571 Pain in right ankle and joints of right foot: Secondary | ICD-10-CM

## 2014-03-12 DIAGNOSIS — N3000 Acute cystitis without hematuria: Secondary | ICD-10-CM

## 2014-03-12 DIAGNOSIS — F329 Major depressive disorder, single episode, unspecified: Secondary | ICD-10-CM | POA: Insufficient documentation

## 2014-03-12 DIAGNOSIS — J441 Chronic obstructive pulmonary disease with (acute) exacerbation: Secondary | ICD-10-CM | POA: Insufficient documentation

## 2014-03-12 DIAGNOSIS — Z8742 Personal history of other diseases of the female genital tract: Secondary | ICD-10-CM | POA: Insufficient documentation

## 2014-03-12 DIAGNOSIS — Z8639 Personal history of other endocrine, nutritional and metabolic disease: Secondary | ICD-10-CM | POA: Insufficient documentation

## 2014-03-12 DIAGNOSIS — Z21 Asymptomatic human immunodeficiency virus [HIV] infection status: Secondary | ICD-10-CM | POA: Insufficient documentation

## 2014-03-12 LAB — URINALYSIS, ROUTINE W REFLEX MICROSCOPIC
BILIRUBIN URINE: NEGATIVE
Glucose, UA: NEGATIVE mg/dL
Ketones, ur: NEGATIVE mg/dL
NITRITE: NEGATIVE
PH: 5.5 (ref 5.0–8.0)
Protein, ur: NEGATIVE mg/dL
SPECIFIC GRAVITY, URINE: 1.017 (ref 1.005–1.030)
Urobilinogen, UA: 0.2 mg/dL (ref 0.0–1.0)

## 2014-03-12 LAB — URINE MICROSCOPIC-ADD ON

## 2014-03-12 MED ORDER — HYDROMORPHONE HCL PF 1 MG/ML IJ SOLN
2.0000 mg | Freq: Once | INTRAMUSCULAR | Status: AC
  Administered 2014-03-12: 2 mg via INTRAMUSCULAR
  Filled 2014-03-12: qty 2

## 2014-03-12 MED ORDER — CEPHALEXIN 500 MG PO CAPS: 500.0000 mg | ORAL_CAPSULE | Freq: Four times a day (QID) | ORAL | Status: DC
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
## 2014-03-12 NOTE — ED Notes (Signed)
PT has chronic intermittent R ankle pain. PT said onset was out of the blue on 06/27/12. PT has been to 2 ortho docs and is scheduled for biopsy on Thursday. PT stated she was walking around on it a lot yesterday and is in incredible pain today. She tried to go to [facility] to pickup pain med refills but they were waiting on a delivery. PT takes 15mg  morphine PO and 10mg  percocet at home for pain management

## 2014-03-12 NOTE — ED Notes (Signed)
Pt presents to department for evaluation of R foot pain and swelling. States chronic issues with R foot, states she is scheduled to have biopsy of R foot this week. States pain became worse today. 10/10 pain upon arrival, unable to bear weight on foot.

## 2014-03-12 NOTE — ED Notes (Signed)
PT also complains of R wrist numbness and tingling-asked MD for wrist splint

## 2014-03-12 NOTE — Discharge Instructions (Signed)
Followup with your orthopedist as scheduled.   Ankle Pain Ankle pain is a common symptom. The bones, cartilage, tendons, and muscles of the ankle joint perform a lot of work each day. The ankle joint holds your body weight and allows you to move around. Ankle pain can occur on either side or back of 1 or both ankles. Ankle pain may be sharp and burning or dull and aching. There may be tenderness, stiffness, redness, or warmth around the ankle. The pain occurs more often when a person walks or puts pressure on the ankle. CAUSES  There are many reasons ankle pain can develop. It is important to work with your caregiver to identify the cause since many conditions can impact the bones, cartilage, muscles, and tendons. Causes for ankle pain include:  Injury, including a break (fracture), sprain, or strain often due to a fall, sports, or a high-impact activity.  Swelling (inflammation) of a tendon (tendonitis).  Achilles tendon rupture.  Ankle instability after repeated sprains and strains.  Poor foot alignment.  Pressure on a nerve (tarsal tunnel syndrome).  Arthritis in the ankle or the lining of the ankle.  Crystal formation in the ankle (gout or pseudogout). DIAGNOSIS  A diagnosis is based on your medical history, your symptoms, results of your physical exam, and results of diagnostic tests. Diagnostic tests may include X-ray exams or a computerized magnetic scan (magnetic resonance imaging, MRI). TREATMENT  Treatment will depend on the cause of your ankle pain and may include:  Keeping pressure off the ankle and limiting activities.  Using crutches or other walking support (a cane or brace).  Using rest, ice, compression, and elevation.  Participating in physical therapy or home exercises.  Wearing shoe inserts or special shoes.  Losing weight.  Taking medications to reduce pain or swelling or receiving an injection.  Undergoing surgery. HOME CARE INSTRUCTIONS   Only take  over-the-counter or prescription medicines for pain, discomfort, or fever as directed by your caregiver.  Put ice on the injured area.  Put ice in a plastic bag.  Place a towel between your skin and the bag.  Leave the ice on for 15-20 minutes at a time, 03-04 times a day.  Keep your leg raised (elevated) when possible to lessen swelling.  Avoid activities that cause ankle pain.  Follow specific exercises as directed by your caregiver.  Record how often you have ankle pain, the location of the pain, and what it feels like. This information may be helpful to you and your caregiver.  Ask your caregiver about returning to work or sports and whether you should drive.  Follow up with your caregiver for further examination, therapy, or testing as directed. SEEK MEDICAL CARE IF:   Pain or swelling continues or worsens beyond 1 week.  You have an oral temperature above 102 F (38.9 C).  You are feeling unwell or have chills.  You are having an increasingly difficult time with walking.  You have loss of sensation or other new symptoms.  You have questions or concerns. MAKE SURE YOU:   Understand these instructions.  Will watch your condition.  Will get help right away if you are not doing well or get worse. Document Released: 02/10/2010 Document Revised: 11/15/2011 Document Reviewed: 02/10/2010 [facility] Patient Information 2015 [place], [place]. This information is not intended to replace advice given to you by your health care provider. Make sure you discuss any questions you have with your health care provider.

## 2014-03-12 NOTE — ED Notes (Signed)
Pt placed on monitor upon arrival to room. Pt monitored by blood pressure and pulse ox.  

## 2014-03-12 NOTE — ED Notes (Signed)
MD at bedside. Dr Stark Jock

## 2014-03-13 ENCOUNTER — Other Ambulatory Visit: Payer: Self-pay | Admitting: Orthopedic Surgery

## 2014-03-13 NOTE — Progress Notes (Signed)
Patient called with time change to arrive at 1230.

## 2014-03-14 ENCOUNTER — Encounter (HOSPITAL_COMMUNITY)
Admission: RE | Disposition: A | Discharge status: Home / Self Care | Payer: Self-pay | Source: Ambulatory Visit | Attending: Orthopedic Surgery

## 2014-03-14 ENCOUNTER — Encounter (HOSPITAL_COMMUNITY): Payer: Self-pay | Admitting: *Deleted

## 2014-03-14 ENCOUNTER — Ambulatory Visit (HOSPITAL_COMMUNITY)
Admission: RE | Admit: 2014-03-14 | Discharge: 2014-03-14 | Disposition: A | Discharge status: Home / Self Care | Payer: Medicare HMO | Source: Ambulatory Visit | Attending: Orthopedic Surgery | Admitting: Orthopedic Surgery

## 2014-03-14 DIAGNOSIS — Z5309 Procedure and treatment not carried out because of other contraindication: Secondary | ICD-10-CM | POA: Insufficient documentation

## 2014-03-14 DIAGNOSIS — M25579 Pain in unspecified ankle and joints of unspecified foot: Secondary | ICD-10-CM | POA: Insufficient documentation

## 2014-03-14 DIAGNOSIS — Z21 Asymptomatic human immunodeficiency virus [HIV] infection status: Secondary | ICD-10-CM | POA: Insufficient documentation

## 2014-03-14 DIAGNOSIS — M25471 Effusion, right ankle: Secondary | ICD-10-CM

## 2014-03-14 HISTORY — DX: Other specified postprocedural states: Z98.890

## 2014-03-14 SURGERY — ARTHROTOMY
Anesthesia: General | Site: Ankle | Laterality: Right

## 2014-03-14 MED ORDER — FENTANYL CITRATE 0.05 MG/ML IJ SOLN
INTRAMUSCULAR | Status: AC
  Administered 2014-03-14: 50 ug via INTRAVENOUS
  Filled 2014-03-14: qty 2

## 2014-03-14 MED ORDER — FENTANYL CITRATE 0.05 MG/ML IJ SOLN
INTRAMUSCULAR | Status: AC
  Filled 2014-03-14: qty 5

## 2014-03-14 MED ORDER — MIDAZOLAM HCL 2 MG/2ML IJ SOLN
1.0000 mg | INTRAMUSCULAR | Status: DC | PRN
  Administered 2014-03-14: 1 mg via INTRAVENOUS

## 2014-03-14 MED ORDER — LACTATED RINGERS IV SOLN
INTRAVENOUS | Status: DC
  Administered 2014-03-14: 13:00:00 via INTRAVENOUS

## 2014-03-14 MED ORDER — SODIUM CHLORIDE 0.9 % IV SOLN: INTRAVENOUS | Status: DC

## 2014-03-14 MED ORDER — MIDAZOLAM HCL 2 MG/2ML IJ SOLN
INTRAMUSCULAR | Status: AC
  Administered 2014-03-14: 1 mg via INTRAVENOUS
  Filled 2014-03-14: qty 2

## 2014-03-14 MED ORDER — PROPOFOL 10 MG/ML IV BOLUS
INTRAVENOUS | Status: AC
  Filled 2014-03-14: qty 20

## 2014-03-14 MED ORDER — LIDOCAINE HCL (CARDIAC) 20 MG/ML IV SOLN
INTRAVENOUS | Status: AC
  Filled 2014-03-14: qty 5

## 2014-03-14 MED ORDER — BUPIVACAINE-EPINEPHRINE (PF) 0.5% -1:200000 IJ SOLN
INTRAMUSCULAR | Status: DC | PRN
  Administered 2014-03-14: 30 mL via PERINEURAL

## 2014-03-14 MED ORDER — ACETAMINOPHEN 500 MG PO TABS
ORAL_TABLET | ORAL | Status: AC
  Administered 2014-03-14: 1000 mg via ORAL
  Filled 2014-03-14: qty 2

## 2014-03-14 MED ORDER — MIDAZOLAM HCL 2 MG/2ML IJ SOLN
INTRAMUSCULAR | Status: AC
  Filled 2014-03-14: qty 2

## 2014-03-14 MED ORDER — ACETAMINOPHEN 500 MG PO TABS
1000.0000 mg | ORAL_TABLET | Freq: Once | ORAL | Status: AC
  Administered 2014-03-14: 1000 mg via ORAL

## 2014-03-14 MED ORDER — FENTANYL CITRATE 0.05 MG/ML IJ SOLN
50.0000 ug | INTRAMUSCULAR | Status: DC | PRN
Start: 2014-03-14 — End: 2014-03-14
  Administered 2014-03-14: 50 ug via INTRAVENOUS

## 2014-03-14 MED ORDER — CHLORHEXIDINE GLUCONATE 4 % EX LIQD
60.0000 mL | Freq: Once | CUTANEOUS | Status: AC
  Administered 2014-03-14: 4 via TOPICAL
  Filled 2014-03-14: qty 60

## 2014-03-14 SURGICAL SUPPLY — 49 items
BANDAGE ESMARK 6X9 LF (GAUZE/BANDAGES/DRESSINGS) ×1 IMPLANT
BLADE SURG 10 STRL SS (BLADE) ×3 IMPLANT
BNDG COHESIVE 4X5 TAN STRL (GAUZE/BANDAGES/DRESSINGS) ×3 IMPLANT
BNDG COHESIVE 6X5 TAN STRL LF (GAUZE/BANDAGES/DRESSINGS) ×3 IMPLANT
BNDG ESMARK 6X9 LF (GAUZE/BANDAGES/DRESSINGS) ×3
CANISTER SUCT 3000ML (MISCELLANEOUS) ×3 IMPLANT
CHLORAPREP W/TINT 26ML (MISCELLANEOUS) ×3 IMPLANT
COVER SURGICAL LIGHT HANDLE (MISCELLANEOUS) ×3 IMPLANT
CUFF TOURNIQUET SINGLE 34IN LL (TOURNIQUET CUFF) ×3 IMPLANT
CUFF TOURNIQUET SINGLE 44IN (TOURNIQUET CUFF) IMPLANT
DRAPE C-ARM MINI 42X72 WSTRAPS (DRAPES) ×3 IMPLANT
DRAPE U-SHAPE 47X51 STRL (DRAPES) ×3 IMPLANT
DRSG ADAPTIC 3X8 NADH LF (GAUZE/BANDAGES/DRESSINGS) IMPLANT
DRSG PAD ABDOMINAL 8X10 ST (GAUZE/BANDAGES/DRESSINGS) IMPLANT
ELECT REM PT RETURN 9FT ADLT (ELECTROSURGICAL) ×3
ELECTRODE REM PT RTRN 9FT ADLT (ELECTROSURGICAL) ×1 IMPLANT
GLOVE BIO SURGEON STRL SZ7 (GLOVE) ×3 IMPLANT
GLOVE BIO SURGEON STRL SZ8 (GLOVE) ×3 IMPLANT
GLOVE BIOGEL PI IND STRL 7.5 (GLOVE) ×1 IMPLANT
GLOVE BIOGEL PI IND STRL 8 (GLOVE) ×1 IMPLANT
GLOVE BIOGEL PI INDICATOR 7.5 (GLOVE) ×2
GLOVE BIOGEL PI INDICATOR 8 (GLOVE) ×2
GOWN STRL REUS W/ TWL LRG LVL3 (GOWN DISPOSABLE) ×2 IMPLANT
GOWN STRL REUS W/ TWL XL LVL3 (GOWN DISPOSABLE) ×1 IMPLANT
GOWN STRL REUS W/TWL LRG LVL3 (GOWN DISPOSABLE) ×4
GOWN STRL REUS W/TWL XL LVL3 (GOWN DISPOSABLE) ×2
KIT BASIN OR (CUSTOM PROCEDURE TRAY) ×3 IMPLANT
KIT ROOM TURNOVER OR (KITS) ×3 IMPLANT
NEEDLE 22X1 1/2 (OR ONLY) (NEEDLE) IMPLANT
NS IRRIG 1000ML POUR BTL (IV SOLUTION) ×3 IMPLANT
PACK ORTHO EXTREMITY (CUSTOM PROCEDURE TRAY) ×3 IMPLANT
PAD ARMBOARD 7.5X6 YLW CONV (MISCELLANEOUS) ×6 IMPLANT
PAD CAST 4YDX4 CTTN HI CHSV (CAST SUPPLIES) ×1 IMPLANT
PADDING CAST COTTON 4X4 STRL (CAST SUPPLIES) ×2
SOAP 2 % CHG 4 OZ (WOUND CARE) ×3 IMPLANT
SPONGE GAUZE 4X4 12PLY (GAUZE/BANDAGES/DRESSINGS) IMPLANT
STAPLER VISISTAT 35W (STAPLE) IMPLANT
SUCTION FRAZIER TIP 10 FR DISP (SUCTIONS) ×3 IMPLANT
SUT PROLENE 3 0 PS 2 (SUTURE) ×3 IMPLANT
SUT VIC AB 2-0 CT1 27 (SUTURE) ×4
SUT VIC AB 2-0 CT1 TAPERPNT 27 (SUTURE) ×2 IMPLANT
SUT VIC AB 3-0 PS2 18 (SUTURE) ×2
SUT VIC AB 3-0 PS2 18XBRD (SUTURE) ×1 IMPLANT
SYR CONTROL 10ML LL (SYRINGE) IMPLANT
TOWEL OR 17X24 6PK STRL BLUE (TOWEL DISPOSABLE) ×3 IMPLANT
TOWEL OR 17X26 10 PK STRL BLUE (TOWEL DISPOSABLE) ×3 IMPLANT
TUBE CONNECTING 12'X1/4 (SUCTIONS) ×1
TUBE CONNECTING 12X1/4 (SUCTIONS) ×2 IMPLANT
WATER STERILE IRR 1000ML POUR (IV SOLUTION) ×3 IMPLANT

## 2014-03-14 NOTE — Progress Notes (Signed)
Pt reports that she does not have anyone to help care for her at home and is requesting nursing home placement. Note placed on front of chart for Dr. Doran Durand. Pt reports that daughter and boyfriend live with her but are not "around enough" to care for her.

## 2014-03-14 NOTE — Discharge Instructions (Signed)
My office will be in touch to re-schedule your surgery.  Call [phone] with any questions.
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
## 2014-03-14 NOTE — Progress Notes (Addendum)
Pt's boyfriend at bedside but pt continues to request that we take belongings to PACU and lock up valuables with Security. Pt reports that she is not sure if he will be here when she needs belongings.

## 2014-03-14 NOTE — Progress Notes (Signed)
Patient was discharged via wheelchair alert and oriented. Aldrete score 10 after receiving versed and fentanyl for popliteal block. Surgery was cancelled after block was placed. Patient was instructed to go home and rest and not to drive for rest of day due to medications received. Patient verbalized understanding.

## 2014-03-14 NOTE — Anesthesia Procedure Notes (Signed)
Anesthesia Regional Block:  Popliteal block  Pre-Anesthetic Checklist: ,, timeout performed, Correct Patient, Correct Site, Correct Laterality, Correct Procedure, Correct Position, site marked, Risks and benefits discussed,  Surgical consent,  Pre-op evaluation,  At surgeon's request and post-op pain management  Laterality: Right  Prep: chloraprep       Needles:  Injection technique: Single-shot  Needle Type: Echogenic Stimulator Needle     Needle Length: 9cm 9 cm Needle Gauge: 21 and 21 G    Additional Needles:  Procedures: ultrasound guided (picture in chart) and nerve stimulator Popliteal block  Nerve Stimulator or Paresthesia:  Response: plantar flexion of foot, 0.45 mA,   Additional Responses:   Narrative:  Start time: 03/14/2014 2:52 PM End time: 03/14/2014 3:02 PM Injection made incrementally with aspirations every 5 mL.  Performed by: Personally  Anesthesiologist: Dr Marcie Bal  Additional Notes: Functioning IV was confirmed and monitors were applied.  A 44mm 21ga Arrow echogenic stimulator needle was used. Sterile prep and drape,hand hygiene and sterile gloves were used.  Negative aspiration and negative test dose prior to incremental administration of local anesthetic. The patient tolerated the procedure well.  Ultrasound guidance: relevent anatomy identified, needle position confirmed, local anesthetic spread visualized around nerve(s), vascular puncture avoided.  Image printed for medical record.

## 2014-03-14 NOTE — Anesthesia Preprocedure Evaluation (Signed)
Anesthesia Evaluation  Patient identified by MRN, date of birth, ID band Patient awake    Reviewed: Allergy & Precautions, H&P , NPO status , Patient's Chart, lab work & pertinent test results  Airway Mallampati: II  Neck ROM: full    Dental   Pulmonary sleep apnea , COPDformer smoker,          Cardiovascular hypertension, + angina + CAD, + Past MI and +CHF     Neuro/Psych Anxiety Depression TIA Neuromuscular disease CVA    GI/Hepatic   Endo/Other  Hypothyroidism Morbid obesity  Renal/GU      Musculoskeletal  (+) Arthritis -,   Abdominal   Peds  Hematology  (+) HIV,   Anesthesia Other Findings   Reproductive/Obstetrics                           Anesthesia Physical Anesthesia Plan  ASA: III  Anesthesia Plan: General and Regional   Post-op Pain Management: MAC Combined w/ Regional for Post-op pain   Induction: Intravenous  Airway Management Planned: LMA  Additional Equipment:   Intra-op Plan:   Post-operative Plan:   Informed Consent: I have reviewed the patients History and Physical, chart, labs and discussed the procedure including the risks, benefits and alternatives for the proposed anesthesia with the patient or authorized representative who has indicated his/her understanding and acceptance.     Plan Discussed with: CRNA, Anesthesiologist and Surgeon  Anesthesia Plan Comments:         Anesthesia Quick Evaluation

## 2014-03-18 ENCOUNTER — Encounter: Payer: Self-pay | Admitting: Internal Medicine

## 2014-03-20 ENCOUNTER — Ambulatory Visit (INDEPENDENT_AMBULATORY_CARE_PROVIDER_SITE_OTHER): Payer: Commercial Managed Care - HMO | Admitting: *Deleted

## 2014-03-20 DIAGNOSIS — I6350 Cerebral infarction due to unspecified occlusion or stenosis of unspecified cerebral artery: Secondary | ICD-10-CM

## 2014-03-20 DIAGNOSIS — I639 Cerebral infarction, unspecified: Secondary | ICD-10-CM

## 2014-03-21 ENCOUNTER — Ambulatory Visit: Payer: Commercial Managed Care - HMO | Admitting: *Deleted

## 2014-03-21 LAB — MDC_IDC_ENUM_SESS_TYPE_REMOTE
Date Time Interrogation Session: 20150709030520
MDC IDC SET ZONE DETECTION INTERVAL: 3000 ms
Zone Setting Detection Interval: 2000 ms
Zone Setting Detection Interval: 340 ms

## 2014-03-26 ENCOUNTER — Telehealth: Payer: Self-pay | Admitting: *Deleted

## 2014-03-26 NOTE — Telephone Encounter (Signed)
Pt called stating she is having surgery on her ankle this Thursday.  She is asking for home health services after surgery because she will be home alone all day.  She needs help with ADL. Pt advised that her surgeon would be the correct doctor to order needed services, depending of what is done. Pt will call his office before surgery date.  Dr Lynnae January aware

## 2014-03-27 ENCOUNTER — Other Ambulatory Visit: Payer: Self-pay | Admitting: Orthopedic Surgery

## 2014-03-27 ENCOUNTER — Encounter (HOSPITAL_COMMUNITY): Payer: Self-pay | Admitting: *Deleted

## 2014-03-27 NOTE — Progress Notes (Signed)
03/27/14 1225  OBSTRUCTIVE SLEEP APNEA  Have you ever been diagnosed with sleep apnea through a sleep study? No  Do you snore loudly (loud enough to be heard through closed doors)?  0  Do you often feel tired, fatigued, or sleepy during the daytime? 1  Has anyone observed you stop breathing during your sleep? 0  Do you have, or are you being treated for high blood pressure? 1  BMI more than 35 kg/m2? 1  Age over 53 years old? 1  Gender: 0  Obstructive Sleep Apnea Score 4

## 2014-03-27 NOTE — Progress Notes (Signed)
Loop recorder 

## 2014-03-28 ENCOUNTER — Ambulatory Visit (HOSPITAL_COMMUNITY)
Admission: RE | Admit: 2014-03-28 | Discharge: 2014-03-28 | Disposition: A | Discharge status: Home / Self Care | Payer: Medicare HMO | Source: Ambulatory Visit | Attending: Orthopedic Surgery | Admitting: Orthopedic Surgery

## 2014-03-28 ENCOUNTER — Encounter (HOSPITAL_COMMUNITY): Payer: Medicare HMO | Admitting: Certified Registered"

## 2014-03-28 ENCOUNTER — Ambulatory Visit (HOSPITAL_COMMUNITY): Payer: Medicare HMO | Admitting: Certified Registered"

## 2014-03-28 ENCOUNTER — Encounter (HOSPITAL_COMMUNITY): Payer: Self-pay | Admitting: *Deleted

## 2014-03-28 ENCOUNTER — Encounter (HOSPITAL_COMMUNITY)
Admission: RE | Disposition: A | Discharge status: Home / Self Care | Payer: Self-pay | Source: Ambulatory Visit | Attending: Orthopedic Surgery

## 2014-03-28 DIAGNOSIS — Z9861 Coronary angioplasty status: Secondary | ICD-10-CM | POA: Diagnosis not present

## 2014-03-28 DIAGNOSIS — I69928 Other speech and language deficits following unspecified cerebrovascular disease: Secondary | ICD-10-CM | POA: Insufficient documentation

## 2014-03-28 DIAGNOSIS — M869 Osteomyelitis, unspecified: Secondary | ICD-10-CM

## 2014-03-28 DIAGNOSIS — I252 Old myocardial infarction: Secondary | ICD-10-CM | POA: Insufficient documentation

## 2014-03-28 DIAGNOSIS — Z87891 Personal history of nicotine dependence: Secondary | ICD-10-CM | POA: Diagnosis not present

## 2014-03-28 DIAGNOSIS — Z79899 Other long term (current) drug therapy: Secondary | ICD-10-CM | POA: Diagnosis not present

## 2014-03-28 DIAGNOSIS — I2510 Atherosclerotic heart disease of native coronary artery without angina pectoris: Secondary | ICD-10-CM | POA: Diagnosis not present

## 2014-03-28 DIAGNOSIS — E039 Hypothyroidism, unspecified: Secondary | ICD-10-CM | POA: Diagnosis not present

## 2014-03-28 DIAGNOSIS — M25579 Pain in unspecified ankle and joints of unspecified foot: Secondary | ICD-10-CM | POA: Diagnosis present

## 2014-03-28 DIAGNOSIS — J449 Chronic obstructive pulmonary disease, unspecified: Secondary | ICD-10-CM | POA: Diagnosis not present

## 2014-03-28 DIAGNOSIS — Z21 Asymptomatic human immunodeficiency virus [HIV] infection status: Secondary | ICD-10-CM | POA: Insufficient documentation

## 2014-03-28 DIAGNOSIS — I10 Essential (primary) hypertension: Secondary | ICD-10-CM | POA: Diagnosis not present

## 2014-03-28 DIAGNOSIS — F329 Major depressive disorder, single episode, unspecified: Secondary | ICD-10-CM | POA: Insufficient documentation

## 2014-03-28 DIAGNOSIS — G4730 Sleep apnea, unspecified: Secondary | ICD-10-CM | POA: Insufficient documentation

## 2014-03-28 DIAGNOSIS — F411 Generalized anxiety disorder: Secondary | ICD-10-CM | POA: Diagnosis not present

## 2014-03-28 DIAGNOSIS — I509 Heart failure, unspecified: Secondary | ICD-10-CM | POA: Insufficient documentation

## 2014-03-28 DIAGNOSIS — K5900 Constipation, unspecified: Secondary | ICD-10-CM | POA: Diagnosis not present

## 2014-03-28 DIAGNOSIS — F3289 Other specified depressive episodes: Secondary | ICD-10-CM | POA: Insufficient documentation

## 2014-03-28 DIAGNOSIS — M25473 Effusion, unspecified ankle: Secondary | ICD-10-CM | POA: Diagnosis not present

## 2014-03-28 DIAGNOSIS — M25476 Effusion, unspecified foot: Secondary | ICD-10-CM | POA: Insufficient documentation

## 2014-03-28 DIAGNOSIS — E7800 Pure hypercholesterolemia, unspecified: Secondary | ICD-10-CM | POA: Diagnosis not present

## 2014-03-28 DIAGNOSIS — Z7902 Long term (current) use of antithrombotics/antiplatelets: Secondary | ICD-10-CM | POA: Diagnosis not present

## 2014-03-28 DIAGNOSIS — J4489 Other specified chronic obstructive pulmonary disease: Secondary | ICD-10-CM | POA: Insufficient documentation

## 2014-03-28 HISTORY — PX: I&D EXTREMITY: SHX5045

## 2014-03-28 SURGERY — IRRIGATION AND DEBRIDEMENT EXTREMITY
Anesthesia: General | Site: Ankle | Laterality: Right

## 2014-03-28 MED ORDER — FENTANYL CITRATE 0.05 MG/ML IJ SOLN
50.0000 ug | INTRAMUSCULAR | Status: DC | PRN
  Administered 2014-03-28: 100 ug via INTRAVENOUS

## 2014-03-28 MED ORDER — ARTIFICIAL TEARS OP OINT
TOPICAL_OINTMENT | OPHTHALMIC | Status: DC | PRN
  Administered 2014-03-28: 1 via OPHTHALMIC

## 2014-03-28 MED ORDER — CEFAZOLIN SODIUM-DEXTROSE 2-3 GM-% IV SOLR
INTRAVENOUS | Status: AC
  Filled 2014-03-28: qty 50

## 2014-03-28 MED ORDER — ONDANSETRON HCL 4 MG/2ML IJ SOLN
INTRAMUSCULAR | Status: DC | PRN
  Administered 2014-03-28: 4 mg via INTRAVENOUS

## 2014-03-28 MED ORDER — CARVEDILOL 3.125 MG PO TABS
ORAL_TABLET | ORAL | Status: DC
Start: 2014-03-28 — End: 2014-03-28
  Filled 2014-03-28: qty 1

## 2014-03-28 MED ORDER — LACTATED RINGERS IV SOLN
INTRAVENOUS | Status: DC
  Administered 2014-03-28: 13:00:00 via INTRAVENOUS

## 2014-03-28 MED ORDER — CARVEDILOL 3.125 MG PO TABS
3.1250 mg | ORAL_TABLET | Freq: Two times a day (BID) | ORAL | Status: DC
  Administered 2014-03-28: 3.125 mg via ORAL
  Filled 2014-03-28: qty 1

## 2014-03-28 MED ORDER — CEFAZOLIN SODIUM-DEXTROSE 2-3 GM-% IV SOLR
2.0000 g | Freq: Once | INTRAVENOUS | Status: AC
  Administered 2014-03-28: 2 g via INTRAVENOUS

## 2014-03-28 MED ORDER — BUPIVACAINE-EPINEPHRINE (PF) 0.5% -1:200000 IJ SOLN
INTRAMUSCULAR | Status: DC | PRN
  Administered 2014-03-28: 30 mL via PERINEURAL

## 2014-03-28 MED ORDER — FENTANYL CITRATE 0.05 MG/ML IJ SOLN
INTRAMUSCULAR | Status: AC
  Filled 2014-03-28: qty 5

## 2014-03-28 MED ORDER — MIDAZOLAM HCL 2 MG/2ML IJ SOLN
INTRAMUSCULAR | Status: AC
  Administered 2014-03-28: 2 mg via INTRAVENOUS
  Filled 2014-03-28: qty 2

## 2014-03-28 MED ORDER — LIDOCAINE HCL (CARDIAC) 20 MG/ML IV SOLN
INTRAVENOUS | Status: DC | PRN
  Administered 2014-03-28: 100 mg via INTRAVENOUS

## 2014-03-28 MED ORDER — BUPIVACAINE-EPINEPHRINE (PF) 0.5% -1:200000 IJ SOLN
INTRAMUSCULAR | Status: AC
  Filled 2014-03-28: qty 30

## 2014-03-28 MED ORDER — PROPOFOL 10 MG/ML IV BOLUS
INTRAVENOUS | Status: DC | PRN
  Administered 2014-03-28: 40 mg via INTRAVENOUS
  Administered 2014-03-28: 100 mg via INTRAVENOUS

## 2014-03-28 MED ORDER — PROPOFOL 10 MG/ML IV BOLUS
INTRAVENOUS | Status: AC
  Filled 2014-03-28: qty 20

## 2014-03-28 MED ORDER — MIDAZOLAM HCL 2 MG/2ML IJ SOLN
INTRAMUSCULAR | Status: AC
  Filled 2014-03-28: qty 2

## 2014-03-28 MED ORDER — 0.9 % SODIUM CHLORIDE (POUR BTL) OPTIME
TOPICAL | Status: DC | PRN
  Administered 2014-03-28: 1000 mL

## 2014-03-28 MED ORDER — FENTANYL CITRATE 0.05 MG/ML IJ SOLN
INTRAMUSCULAR | Status: AC
  Administered 2014-03-28: 100 ug via INTRAVENOUS
  Filled 2014-03-28: qty 2

## 2014-03-28 MED ORDER — BUPIVACAINE-EPINEPHRINE 0.5% -1:200000 IJ SOLN
INTRAMUSCULAR | Status: DC | PRN
  Administered 2014-03-28: 30 mL

## 2014-03-28 MED ORDER — MIDAZOLAM HCL 2 MG/2ML IJ SOLN
1.0000 mg | INTRAMUSCULAR | Status: DC | PRN
  Administered 2014-03-28: 2 mg via INTRAVENOUS

## 2014-03-28 MED ORDER — ONDANSETRON HCL 4 MG/2ML IJ SOLN
INTRAMUSCULAR | Status: AC
  Filled 2014-03-28: qty 2

## 2014-03-28 MED ORDER — LIDOCAINE-EPINEPHRINE (PF) 1.5 %-1:200000 IJ SOLN
INTRAMUSCULAR | Status: DC | PRN
  Administered 2014-03-28: 30 mL via PERINEURAL

## 2014-03-28 MED ORDER — ROCURONIUM BROMIDE 50 MG/5ML IV SOLN
INTRAVENOUS | Status: AC
  Filled 2014-03-28: qty 1

## 2014-03-28 SURGICAL SUPPLY — 65 items
BANDAGE ELASTIC 4 VELCRO ST LF (GAUZE/BANDAGES/DRESSINGS) ×3 IMPLANT
BANDAGE ESMARK 6X9 LF (GAUZE/BANDAGES/DRESSINGS) ×1 IMPLANT
BLADE SURG 10 STRL SS (BLADE) ×3 IMPLANT
BNDG COHESIVE 4X5 TAN STRL (GAUZE/BANDAGES/DRESSINGS) ×3 IMPLANT
BNDG COHESIVE 6X5 TAN STRL LF (GAUZE/BANDAGES/DRESSINGS) ×3 IMPLANT
BNDG CONFORM 3 STRL LF (GAUZE/BANDAGES/DRESSINGS) ×3 IMPLANT
BNDG ESMARK 6X9 LF (GAUZE/BANDAGES/DRESSINGS) ×3
CANISTER SUCT 3000ML (MISCELLANEOUS) ×3 IMPLANT
CHLORAPREP W/TINT 26ML (MISCELLANEOUS) ×3 IMPLANT
CONT SPEC 4OZ CLIKSEAL STRL BL (MISCELLANEOUS) ×6 IMPLANT
COVER SURGICAL LIGHT HANDLE (MISCELLANEOUS) ×3 IMPLANT
CUFF TOURNIQUET SINGLE 34IN LL (TOURNIQUET CUFF) ×3 IMPLANT
CUFF TOURNIQUET SINGLE 44IN (TOURNIQUET CUFF) IMPLANT
DRAIN CHANNEL 10M FLAT 3/4 FLT (DRAIN) IMPLANT
DRAIN PENROSE 1/2X12 LTX STRL (WOUND CARE) IMPLANT
DRAPE U-SHAPE 47X51 STRL (DRAPES) ×6 IMPLANT
DRSG ADAPTIC 3X8 NADH LF (GAUZE/BANDAGES/DRESSINGS) ×3 IMPLANT
DRSG PAD ABDOMINAL 8X10 ST (GAUZE/BANDAGES/DRESSINGS) IMPLANT
ELECT REM PT RETURN 9FT ADLT (ELECTROSURGICAL) ×3
ELECTRODE REM PT RTRN 9FT ADLT (ELECTROSURGICAL) ×1 IMPLANT
EVACUATOR SILICONE 100CC (DRAIN) IMPLANT
GLOVE BIO SURGEON STRL SZ7 (GLOVE) ×3 IMPLANT
GLOVE BIO SURGEON STRL SZ8 (GLOVE) ×3 IMPLANT
GLOVE BIOGEL PI IND STRL 7.0 (GLOVE) ×1 IMPLANT
GLOVE BIOGEL PI IND STRL 8 (GLOVE) ×1 IMPLANT
GLOVE BIOGEL PI INDICATOR 7.0 (GLOVE) ×2
GLOVE BIOGEL PI INDICATOR 8 (GLOVE) ×2
GLOVE SURG SS PI 7.0 STRL IVOR (GLOVE) ×3 IMPLANT
GOWN STRL REUS W/ TWL XL LVL3 (GOWN DISPOSABLE) ×1 IMPLANT
GOWN STRL REUS W/TWL XL LVL3 (GOWN DISPOSABLE) ×2
KIT BASIN OR (CUSTOM PROCEDURE TRAY) ×3 IMPLANT
KIT ROOM TURNOVER OR (KITS) ×3 IMPLANT
NEEDLE HYPO 25GX1X1/2 BEV (NEEDLE) ×3 IMPLANT
NS IRRIG 1000ML POUR BTL (IV SOLUTION) ×3 IMPLANT
PACK ORTHO EXTREMITY (CUSTOM PROCEDURE TRAY) ×3 IMPLANT
PAD ABD 8X10 STRL (GAUZE/BANDAGES/DRESSINGS) ×3 IMPLANT
PAD ARMBOARD 7.5X6 YLW CONV (MISCELLANEOUS) ×6 IMPLANT
PAD CAST 4YDX4 CTTN HI CHSV (CAST SUPPLIES) ×1 IMPLANT
PADDING CAST ABS 4INX4YD NS (CAST SUPPLIES) ×2
PADDING CAST ABS COTTON 4X4 ST (CAST SUPPLIES) ×1 IMPLANT
PADDING CAST COTTON 4X4 STRL (CAST SUPPLIES) ×2
SOAP 2 % CHG 4 OZ (WOUND CARE) ×3 IMPLANT
SPONGE GAUZE 4X4 12PLY (GAUZE/BANDAGES/DRESSINGS) ×3 IMPLANT
SPONGE LAP 18X18 X RAY DECT (DISPOSABLE) ×3 IMPLANT
SPONGE LAP 4X18 X RAY DECT (DISPOSABLE) ×3 IMPLANT
STAPLER VISISTAT 35W (STAPLE) IMPLANT
SUCTION FRAZIER TIP 10 FR DISP (SUCTIONS) ×3 IMPLANT
SUT ETHILON 2 0 FS 18 (SUTURE) ×3 IMPLANT
SUT ETHILON 3 0 FSL (SUTURE) IMPLANT
SUT PDS AB 2-0 CT1 27 (SUTURE) ×3 IMPLANT
SUT PROLENE 3 0 PS 2 (SUTURE) ×3 IMPLANT
SUT VIC AB 2-0 CT1 27 (SUTURE) ×4
SUT VIC AB 2-0 CT1 TAPERPNT 27 (SUTURE) ×2 IMPLANT
SUT VIC AB 3-0 PS2 18 (SUTURE) ×2
SUT VIC AB 3-0 PS2 18XBRD (SUTURE) ×1 IMPLANT
SWAB COLLECTION DEVICE MRSA (MISCELLANEOUS) ×3 IMPLANT
SYR CONTROL 10ML LL (SYRINGE) ×3 IMPLANT
TOWEL OR 17X24 6PK STRL BLUE (TOWEL DISPOSABLE) ×3 IMPLANT
TOWEL OR 17X26 10 PK STRL BLUE (TOWEL DISPOSABLE) ×3 IMPLANT
TUBE ANAEROBIC SPECIMEN COL (MISCELLANEOUS) ×3 IMPLANT
TUBE CONNECTING 12'X1/4 (SUCTIONS) ×1
TUBE CONNECTING 12X1/4 (SUCTIONS) ×2 IMPLANT
TUBING CYSTO DISP (UROLOGICAL SUPPLIES) ×3 IMPLANT
WATER STERILE IRR 1000ML POUR (IV SOLUTION) ×3 IMPLANT
YANKAUER SUCT BULB TIP NO VENT (SUCTIONS) ×3 IMPLANT

## 2014-03-28 NOTE — Transfer of Care (Signed)
Immediate Anesthesia Transfer of Care Note  Patient: [name]  Procedure(s) Performed: Procedure(s): RIGHT ANKLE OPEN ARTHROTOMY AND BIOPSY (Right)  Patient Location: PACU  Anesthesia Type:General  Level of Consciousness: awake and oriented  Airway & Oxygen Therapy: Patient Spontanous Breathing and Patient connected to nasal cannula oxygen  Post-op Assessment: Report given to PACU RN  Post vital signs: Reviewed and stable  Complications: No apparent anesthesia complications

## 2014-03-28 NOTE — Anesthesia Procedure Notes (Addendum)
Anesthesia Regional Block:  Popliteal block  Pre-Anesthetic Checklist: ,, timeout performed, Correct Patient, Correct Site, Correct Laterality, Correct Procedure, Correct Position, site marked, Risks and benefits discussed,  Surgical consent,  Pre-op evaluation,  At surgeon's request and post-op pain management  Laterality: Right  Prep: chloraprep       Needles:  Injection technique: Single-shot  Needle Type: Echogenic Stimulator Needle     Needle Length: 10cm 10 cm Needle Gauge: 21 and 21 G    Additional Needles:  Procedures: ultrasound guided (picture in chart) and nerve stimulator Popliteal block  Nerve Stimulator or Paresthesia:  Response: 0.4 mA,   Additional Responses:   Narrative:  Start time: 03/28/2014 2:05 PM End time: 03/28/2014 2:20 PM Injection made incrementally with aspirations every 5 mL.  Performed by: Personally  Anesthesiologist: Lillia Abed MD  Additional Notes: Monitors applied. Patient sedated. Sterile prep and drape,hand hygiene and sterile gloves were used. Relevant anatomy identified.Needle position confirmed.Local anesthetic injected incrementally after negative aspiration. Local anesthetic spread visualized around nerve(s). Vascular puncture avoided. No complications. Image printed for medical record.The patient tolerated the procedure well.  Additional Saphenous nerve block performed. 15cc Local Anesthetic mixture placed under ultrasonic guidance along the medio-inferior border of the Sartorious muscle 6 inches above the knee.  No Problems encountered.  Lillia Abed MD    Procedure Name: LMA Insertion Date/Time: 03/28/2014 3:42 PM Performed by: Barrington Ellison Pre-anesthesia Checklist: Patient identified, Emergency Drugs available, Suction available, Patient being monitored and Timeout performed Patient Re-evaluated:Patient Re-evaluated prior to inductionOxygen Delivery Method: Circle system utilized Preoxygenation: Pre-oxygenation with 100%  oxygen Intubation Type: IV induction LMA: LMA inserted LMA Size: 4.0 Number of attempts: 2 Placement Confirmation: positive ETCO2 and breath sounds checked- equal and bilateral Tube secured with: Tape Dental Injury: Teeth and Oropharynx as per pre-operative assessment

## 2014-03-28 NOTE — Progress Notes (Signed)
Per Dr.Smith labs ok from 03/12/14

## 2014-03-28 NOTE — Anesthesia Preprocedure Evaluation (Addendum)
Anesthesia Evaluation  Patient identified by MRN, date of birth, ID band Patient awake    Reviewed: Allergy & Precautions, H&P , NPO status , Patient's Chart, lab work & pertinent test results  Airway Mallampati: I TM Distance: >3 FB Neck ROM: Full    Dental  (+) Teeth Intact, Dental Advisory Given, Missing   Pulmonary sleep apnea , COPDformer smoker,          Cardiovascular hypertension, Pt. on medications + angina + CAD, + Past MI and +CHF Rhythm:Regular Rate:Normal     Neuro/Psych Patient states that her speech is slurred when she is tired or sedated and that this is from her strokes. TIACVA    GI/Hepatic   Endo/Other  Hypothyroidism   Renal/GU      Musculoskeletal   Abdominal   Peds  Hematology   Anesthesia Other Findings   Reproductive/Obstetrics                          Anesthesia Physical Anesthesia Plan  ASA: III  Anesthesia Plan: General   Post-op Pain Management:    Induction: Intravenous  Airway Management Planned: LMA  Additional Equipment:   Intra-op Plan:   Post-operative Plan: Extubation in OR  Informed Consent: I have reviewed the patients History and Physical, chart, labs and discussed the procedure including the risks, benefits and alternatives for the proposed anesthesia with the patient or authorized representative who has indicated his/her understanding and acceptance.     Plan Discussed with: CRNA and Surgeon  Anesthesia Plan Comments:         Anesthesia Quick Evaluation

## 2014-03-28 NOTE — Discharge Instructions (Signed)
Brooke Simmer, MD [place]  Please read the following information regarding your care after surgery.  Medications  You only need a prescription for the narcotic pain medicine (ex. oxycodone, Percocet, Norco).  All of the other medicines listed below are available over the counter. Percocet as prescribed for pain.  Narcotic pain medicine (ex. oxycodone, Percocet, Vicodin) will cause constipation.  To prevent this problem, take the following medicines while you are taking any pain medicine. X docusate sodium (Colace) 100 mg twice a day X senna (Senokot) 2 tablets twice a day  X To help prevent blood clots, take an aspirin (325 mg) once a day for a month after surgery.  You should also get up every hour while you are awake to move around.    Weight Bearing X Bear weight when you are able on your operated leg or foot.  Cast / Splint / Dressing X Keep your splint or cast clean and dry.  Dont put anything (coat hanger, pencil, etc) down inside of it.  If it gets damp, use a hair dryer on the cool setting to dry it.  If it gets soaked, call the office to schedule an appointment for a cast change. ? Remove your dressing 3 days after surgery and cover the incisions with dry dressings.    After your dressing, cast or splint is removed; you may shower, but do not soak or scrub the wound.  Allow the water to run over it, and then gently pat it dry.  Swelling It is normal for you to have swelling where you had surgery.  To reduce swelling and pain, keep your toes above your nose for at least 3 days after surgery.  It may be necessary to keep your foot or leg elevated for several weeks.  If it hurts, it should be elevated.  Follow Up Call my office at [phone] when you are discharged from the hospital or surgery center to schedule an appointment to be seen two weeks after surgery.  Call my office at [phone] if you develop a fever >101.5 F, nausea, vomiting, bleeding from the  surgical site or severe pain.   What to eat:  For your first meals, you should eat lightly; only small meals initially.  If you do not have nausea, you may eat larger meals.  Avoid spicy, greasy and heavy food.    General Anesthesia, Adult, Care After  Refer to this sheet in the next few weeks. These instructions provide you with information on caring for yourself after your procedure. Your health care provider may also give you more specific instructions. Your treatment has been planned according to current medical practices, but problems sometimes occur. Call your health care provider if you have any problems or questions after your procedure.  WHAT TO EXPECT AFTER THE PROCEDURE  After the procedure, it is typical to experience:  Sleepiness.  Nausea and vomiting. HOME CARE INSTRUCTIONS  For the first 24 hours after general anesthesia:  Have a responsible person with you.  Do not drive a car. If you are alone, do not take public transportation.  Do not drink alcohol.  Do not take medicine that has not been prescribed by your health care provider.  Do not sign important papers or make important decisions.  You may resume a normal diet and activities as directed by your health care provider.  Change bandages (dressings) as directed.  If you have questions or problems that seem related to general anesthesia, call the hospital and  ask for the anesthetist or anesthesiologist on call. SEEK MEDICAL CARE IF:  You have nausea and vomiting that continue the day after anesthesia.  You develop a rash. SEEK IMMEDIATE MEDICAL CARE IF:  You have difficulty breathing.  You have chest pain.  You have any allergic problems. Document Released: 11/29/2000 Document Revised: 04/25/2013 Document Reviewed: 03/08/2013  [facility] Patient Information 2014 [place], [place].

## 2014-03-28 NOTE — Anesthesia Postprocedure Evaluation (Signed)
Anesthesia Post Note  Patient: [name]  Procedure(s) Performed: Procedure(s) (LRB): RIGHT ANKLE OPEN ARTHROTOMY AND BIOPSY (Right)  Anesthesia type: General  Patient location: PACU  Post pain: Pain level controlled and Adequate analgesia  Post assessment: Post-op Vital signs reviewed, Patient's Cardiovascular Status Stable, Respiratory Function Stable, Patent Airway and Pain level controlled  Last Vitals:  Filed Vitals:   03/28/14 1745  BP:   Pulse: 77  Temp: 36.8 C  Resp: 16    Post vital signs: Reviewed and stable  Level of consciousness: awake, alert  and oriented  Complications: No apparent anesthesia complications

## 2014-03-28 NOTE — Brief Op Note (Signed)
03/28/2014  4:26 PM  PATIENT:  Alphonzo Severance  53 y.o. female  PRE-OPERATIVE DIAGNOSIS:  RIGHT ANKLE EFFUSION and pain  POST-OPERATIVE DIAGNOSIS:  RIGHT ANKLE EFFUSION and pain  Procedure(s): RIGHT ANKLE OPEN ARTHROTOMY AND BIOPSies of talus and tibia  SURGEON:  Wylene Simmer, MD  ASSISTANT: n/a  ANESTHESIA:   General  EBL:  minimal   TOURNIQUET:   Total Tourniquet Time Documented: Thigh (Right) - 28 minutes Total: Thigh (Right) - 28 minutes   COMPLICATIONS:  None apparent  DISPOSITION:  Extubated, awake and stable to recovery.  DICTATION ID:  [id number]

## 2014-03-28 NOTE — Progress Notes (Signed)
Orthopedic Tech Progress Note Patient Details:  [name] [birth year] [id number]  Ortho Devices Type of Ortho Device: CAM walker Ortho Device/Splint Location: RLE Ortho Device/Splint Interventions: Ordered;Application   Braulio Bosch 03/28/2014, 5:42 PM
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
## 2014-03-29 ENCOUNTER — Encounter (HOSPITAL_COMMUNITY): Payer: Self-pay | Admitting: Orthopedic Surgery

## 2014-03-29 NOTE — Op Note (Signed)
[name], [name] NO.:  [id number]  MEDICAL RECORD NO.:  [id number]  LOCATION:  [facility]                         FACILITY:  [facility]  PHYSICIAN:  Wylene Simmer, MD        DATE OF BIRTH:  1961-07-17  DATE OF PROCEDURE:  03/28/2014 DATE OF DISCHARGE:  03/28/2014                              OPERATIVE REPORT   PREOPERATIVE DIAGNOSIS:  Right ankle pain and effusion with questionable infection.  POSTOPERATIVE DIAGNOSIS:  Right ankle pain and effusion with questionable infection.  PROCEDURE:  Right ankle arthrotomy with biopsies of the distal tibia and talus.  SURGEON:  Wylene Simmer, MD  ANESTHESIA:  General.  ESTIMATED BLOOD LOSS:  Minimal.  TOURNIQUET TIME:  28 minutes at 250 mmHg.  COMPLICATIONS:  None apparent.  DISPOSITION:  Extubated, awake, and stable to recovery.  SPECIMENS:  Deep tissue from the tibia and talus to Microbiology and Pathology, swabs of the ankle joint fluid to Microbiology for culture.  INDICATIONS FOR PROCEDURE:  The patient is a 53 year old woman with past medical history significant for HIV.  She has a complicated history related to her right ankle.  She has had signs and symptoms suggestive of infection in the past.  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  She has had aspiration of the ankle as well as arthrotomy with tibial and talar biopsies in the past.  All of these cultures showed no growth and pathology showed only normal bone.  She has recurrence of her symptoms of pain and has followed up with Dr. Tommy Medal.  He requests repeat biopsies of the distal tibia and talus given her recurrent symptoms.  She also has elevated sed rate, CRP and normal white count.  She understands the risks and benefits, the alternative treatment options, and elects surgical treatment.  She specifically understands risks of bleeding, infection, nerve  damage, blood clots, need for additional surgery, continued pain, amputation, and death.  PROCEDURE IN DETAIL:  After preoperative consent was obtained and the correct operative site was identified, the patient was brought to the operating room and placed supine on the operating table.  General anesthesia was induced.  Preoperative antibiotics were held.  Surgical time-out was taken.  The right lower extremity was prepped and draped in standard sterile fashion with a tourniquet around the thigh.  The extremity was exsanguinated, the tourniquet was inflated to 250 mmHg. [name] previous incision was opened again sharply over the anterior ankle joint.  Blunt dissection was then carried down through the subcutaneous tissues taking care to protect the neurovascular bundle.  The ankle joint was opened.  The distal tibia was exposed.  A biopsy trocar was used to obtain two specimens of bone.  These were sent to Pathology and Microbiology.  The ankle joint itself had been swabbed from the fluid.  The ankle joint appeared normal with the exception of arthritic changes.  There was no purulence or significant synovitis noted.  The biopsy trocar was then used to obtain specimens from the talus to send to Microbiology and Pathology.  The wound was irrigated copiously.  The deep subcutaneous tissues were closed with simple sutures of 2-0 PDS.  The skin was closed with a running nylon. Subcutaneous tissue was infiltrated with 0.5% Marcaine with epinephrine. Tourniquet was released at 28 minutes after application of the dressings.  The patient was awakened from anesthesia and transported to the recovery room in stable condition.  FOLLOWUP PLAN:  The patient will be weightbearing as tolerated on the right ankle.  She will follow up with me in the office in 2 weeks for suture removal and results of her path.  She will follow up with Dr. Tommy Medal as previously scheduled.     Wylene Simmer,  MD     JH/MEDQ  D:  03/28/2014  T:  03/29/2014  Job:  [id number]  cc:   [name], MD

## 2014-04-01 LAB — TISSUE CULTURE
CULTURE: NO GROWTH
Culture: NO GROWTH
Gram Stain: NONE SEEN
Gram Stain: NONE SEEN

## 2014-04-01 LAB — CULTURE, ROUTINE-ABSCESS: Culture: NO GROWTH

## 2014-04-02 LAB — ANAEROBIC CULTURE
Gram Stain: NONE SEEN
Gram Stain: NONE SEEN

## 2014-04-15 ENCOUNTER — Ambulatory Visit (INDEPENDENT_AMBULATORY_CARE_PROVIDER_SITE_OTHER): Payer: Commercial Managed Care - HMO | Admitting: Infectious Disease

## 2014-04-15 ENCOUNTER — Encounter: Payer: Self-pay | Admitting: Infectious Disease

## 2014-04-15 VITALS — BP 109/71 | HR 92 | Temp 99.3°F

## 2014-04-15 DIAGNOSIS — M19079 Primary osteoarthritis, unspecified ankle and foot: Secondary | ICD-10-CM | POA: Diagnosis not present

## 2014-04-15 DIAGNOSIS — M19071 Primary osteoarthritis, right ankle and foot: Secondary | ICD-10-CM

## 2014-04-15 DIAGNOSIS — B20 Human immunodeficiency virus [HIV] disease: Secondary | ICD-10-CM | POA: Diagnosis not present

## 2014-04-15 DIAGNOSIS — J209 Acute bronchitis, unspecified: Secondary | ICD-10-CM | POA: Diagnosis not present

## 2014-04-15 DIAGNOSIS — J45901 Unspecified asthma with (acute) exacerbation: Secondary | ICD-10-CM

## 2014-04-15 MED ORDER — ALBUTEROL SULFATE HFA 108 (90 BASE) MCG/ACT IN AERS: 2.0000 | INHALATION_SPRAY | Freq: Four times a day (QID) | RESPIRATORY_TRACT | Status: DC | PRN

## 2014-04-15 MED ORDER — BECLOMETHASONE DIPROPIONATE 80 MCG/ACT IN AERS: 2.0000 | INHALATION_SPRAY | Freq: Two times a day (BID) | RESPIRATORY_TRACT | Status: DC

## 2014-04-15 MED ORDER — PREDNISONE 20 MG PO TABS: 40.0000 mg | ORAL_TABLET | Freq: Every day | ORAL | Status: DC

## 2014-04-15 NOTE — Progress Notes (Signed)
Subjective:    Patient ID: [name], female    DOB: Mar 25, 1961, 53 y.o.   MRN: [id number]  Cough Associated symptoms include chest pain and myalgias. Pertinent negatives include no chills, fever, rash, rhinorrhea, sore throat, shortness of breath or wheezing. The symptoms are aggravated by fumes. Risk factors for lung disease include smoking/tobacco exposure. She has tried a beta-agonist inhaler for the symptoms. The treatment provided mild relief. Her past medical history is significant for asthma and COPD.    53 year old with HIV previously recently controlled on STRIBILD.  She has been suffering from "bone pain deep in side my foot."  She had MRI in 2013 which was read as:  Findings: The patient has diffuse subcutaneous edema of the  forefoot particularly on the dorsum of the foot. There is  hypertrophy of the synovium at the first metatarsal phalangeal  joint with osteoarthritis. After contrast administration there is  abnormal enhancement of the joint spaces at the fourth and fifth  tarsometatarsal joints and at the first metatarsal phalangeal  joint. No appreciable fluid collections. Findings are consistent  with synovitis of those joints. No evidence of osteomyelitis. No  visible soft tissue abscesses.  IMPRESSION: Synovitis at the fourth and fifth tarsometatarsal  joints and the first metatarsal phalangeal joint. Atypical  infection should be considered.  She had aspiration of the ankle by Dr Tamera Punt but the aspirate was a "dry tap" He did inject steroids. The patient felt better the following day  She was dc but apparently has continue to have pain in this foot.  Ultimately she had another MRI performed which Dr. 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   Dr Doran Durand to her to the OR last week for Bone biopsy to pathology and for cultures. I cannot tell if pt received any preoperative abx.  She was dc on keflex after Dr. Doran Durand  and I discussed the case. We both agreed pt would need a BKA IF this was indeed osteomyelitis.  Interestingly pathology that is back dId not show osteo or osteonecrosis but "benign bone."  Patient has had VL in the 1K + range and this is because until  March she was "in so much pain and I didn't "give a --" about anything else."  She is now with VL <20 and healthy CD4 count.  Unfortunately her right ankle continues to hurt her and continues to swell when she bears weight.  She has had subsequent MRI and then trip to OR off of antibiotics with Dr. Doran Durand with multiple specimens taken for bacterial, AFB and fungal cultures on 7/23/125 with NGTD on all.   She has had flare of bronchitis with coughing this am upon arrival in the clnic. She thinks made worse by exposure to others who were smoking this weekend.       Review of Systems  Constitutional: Positive for activity change. Negative for fever, chills, diaphoresis, appetite change, fatigue and unexpected weight change.  HENT: Negative for congestion, rhinorrhea, sinus pressure, sneezing, sore throat and trouble swallowing.   Eyes: Negative for photophobia and visual disturbance.  Respiratory: Positive for cough. Negative for chest tightness, shortness of breath, wheezing and stridor.   Cardiovascular: Positive for chest pain. Negative for palpitations and leg swelling.  Gastrointestinal: Negative for nausea, vomiting, abdominal pain, diarrhea, constipation, blood in stool, abdominal distention and anal bleeding.  Genitourinary: Negative for dysuria, hematuria, flank pain and difficulty urinating.  Musculoskeletal: Positive for arthralgias, gait problem,  joint swelling and myalgias. Negative for back pain.  Skin: Negative for color change, pallor, rash and wound.  Neurological: Negative for dizziness, tremors, weakness and light-headedness.  Hematological: Negative for adenopathy. Does not bruise/bleed easily.  Psychiatric/Behavioral:  Negative for behavioral problems, confusion, sleep disturbance and agitation.       Objective:   Physical Exam  Nursing note and vitals reviewed. Constitutional: She is oriented to person, place, and time. She appears well-developed and well-nourished. No distress.  HENT:  Head: Normocephalic and atraumatic.  Mouth/Throat: Oropharynx is clear and moist. No oropharyngeal exudate.  Eyes: Conjunctivae and EOM are normal.  Neck: Normal range of motion. Neck supple.  Cardiovascular: Normal rate, regular rhythm and normal heart sounds.   Pulmonary/Chest: Effort normal. No respiratory distress. She has wheezes.  Abdominal: Soft. She exhibits no distension.  Musculoskeletal: She exhibits no edema and no tenderness.       Feet:  Neurological: She is alert and oriented to person, place, and time. She exhibits normal muscle tone. Coordination normal.  Skin: Skin is warm and dry. She is not diaphoretic. No erythema. No pallor.  Psychiatric: Her behavior is normal. Judgment and thought content normal. Her mood appears anxious.          Assessment & Plan:    Ankle osteoarthritis , steroid responsive arthritis: Dr. Doran Durand I understand is considering ankle fusion and this sounds like the correct curative therapy here.  I would want to make sure that ALL of the AFB and fungal cultures have finalized with NGTD prior to proceeding with surgery   Bronchitis: --refilled her MDI, wrote for QVAR and gave her a 7 day course of prednisone (to call if ssx worsen)HIV: I spent greater than 25 minutes with the patient including greater than 50% of time in face to face counsel of the patient and in coordination of their care.  HIV: continue STRIBILD and will check VL labs in 6 months time

## 2014-04-22 ENCOUNTER — Ambulatory Visit (INDEPENDENT_AMBULATORY_CARE_PROVIDER_SITE_OTHER): Payer: Commercial Managed Care - HMO | Admitting: *Deleted

## 2014-04-22 DIAGNOSIS — I639 Cerebral infarction, unspecified: Secondary | ICD-10-CM

## 2014-04-22 DIAGNOSIS — I6350 Cerebral infarction due to unspecified occlusion or stenosis of unspecified cerebral artery: Secondary | ICD-10-CM

## 2014-04-23 ENCOUNTER — Encounter: Payer: Self-pay | Admitting: Internal Medicine

## 2014-04-26 LAB — FUNGUS CULTURE W SMEAR
FUNGAL SMEAR: NONE SEEN
FUNGAL SMEAR: NONE SEEN
Fungal Smear: NONE SEEN

## 2014-04-26 NOTE — Progress Notes (Signed)
Loop recorder 

## 2014-05-01 ENCOUNTER — Emergency Department (HOSPITAL_COMMUNITY)
Admission: EM | Admit: 2014-05-01 | Discharge: 2014-05-01 | Disposition: A | Discharge status: Home / Self Care | Payer: Medicare HMO | Attending: Family Medicine | Admitting: Family Medicine

## 2014-05-01 ENCOUNTER — Encounter (HOSPITAL_COMMUNITY): Payer: Self-pay | Admitting: Emergency Medicine

## 2014-05-01 DIAGNOSIS — Y92410 Unspecified street and highway as the place of occurrence of the external cause: Secondary | ICD-10-CM | POA: Insufficient documentation

## 2014-05-01 DIAGNOSIS — E7800 Pure hypercholesterolemia, unspecified: Secondary | ICD-10-CM | POA: Insufficient documentation

## 2014-05-01 DIAGNOSIS — Z8719 Personal history of other diseases of the digestive system: Secondary | ICD-10-CM | POA: Diagnosis not present

## 2014-05-01 DIAGNOSIS — Z8742 Personal history of other diseases of the female genital tract: Secondary | ICD-10-CM | POA: Insufficient documentation

## 2014-05-01 DIAGNOSIS — Z8673 Personal history of transient ischemic attack (TIA), and cerebral infarction without residual deficits: Secondary | ICD-10-CM | POA: Diagnosis not present

## 2014-05-01 DIAGNOSIS — Z79899 Other long term (current) drug therapy: Secondary | ICD-10-CM | POA: Insufficient documentation

## 2014-05-01 DIAGNOSIS — Z8619 Personal history of other infectious and parasitic diseases: Secondary | ICD-10-CM | POA: Insufficient documentation

## 2014-05-01 DIAGNOSIS — I252 Old myocardial infarction: Secondary | ICD-10-CM | POA: Diagnosis not present

## 2014-05-01 DIAGNOSIS — F3289 Other specified depressive episodes: Secondary | ICD-10-CM | POA: Diagnosis not present

## 2014-05-01 DIAGNOSIS — Z8669 Personal history of other diseases of the nervous system and sense organs: Secondary | ICD-10-CM | POA: Diagnosis not present

## 2014-05-01 DIAGNOSIS — D509 Iron deficiency anemia, unspecified: Secondary | ICD-10-CM | POA: Diagnosis not present

## 2014-05-01 DIAGNOSIS — I10 Essential (primary) hypertension: Secondary | ICD-10-CM | POA: Insufficient documentation

## 2014-05-01 DIAGNOSIS — Z8701 Personal history of pneumonia (recurrent): Secondary | ICD-10-CM | POA: Diagnosis not present

## 2014-05-01 DIAGNOSIS — J449 Chronic obstructive pulmonary disease, unspecified: Secondary | ICD-10-CM | POA: Diagnosis not present

## 2014-05-01 DIAGNOSIS — Z041 Encounter for examination and observation following transport accident: Secondary | ICD-10-CM

## 2014-05-01 DIAGNOSIS — Z87891 Personal history of nicotine dependence: Secondary | ICD-10-CM | POA: Insufficient documentation

## 2014-05-01 DIAGNOSIS — Y9389 Activity, other specified: Secondary | ICD-10-CM | POA: Insufficient documentation

## 2014-05-01 DIAGNOSIS — Z7902 Long term (current) use of antithrombotics/antiplatelets: Secondary | ICD-10-CM | POA: Insufficient documentation

## 2014-05-01 DIAGNOSIS — S4980XA Other specified injuries of shoulder and upper arm, unspecified arm, initial encounter: Secondary | ICD-10-CM | POA: Diagnosis not present

## 2014-05-01 DIAGNOSIS — IMO0002 Reserved for concepts with insufficient information to code with codable children: Secondary | ICD-10-CM | POA: Insufficient documentation

## 2014-05-01 DIAGNOSIS — M129 Arthropathy, unspecified: Secondary | ICD-10-CM | POA: Diagnosis not present

## 2014-05-01 DIAGNOSIS — F411 Generalized anxiety disorder: Secondary | ICD-10-CM | POA: Diagnosis not present

## 2014-05-01 DIAGNOSIS — Z791 Long term (current) use of non-steroidal anti-inflammatories (NSAID): Secondary | ICD-10-CM | POA: Diagnosis not present

## 2014-05-01 DIAGNOSIS — Z9889 Other specified postprocedural states: Secondary | ICD-10-CM | POA: Diagnosis not present

## 2014-05-01 DIAGNOSIS — Z21 Asymptomatic human immunodeficiency virus [HIV] infection status: Secondary | ICD-10-CM | POA: Diagnosis not present

## 2014-05-01 DIAGNOSIS — I2510 Atherosclerotic heart disease of native coronary artery without angina pectoris: Secondary | ICD-10-CM | POA: Diagnosis not present

## 2014-05-01 DIAGNOSIS — F329 Major depressive disorder, single episode, unspecified: Secondary | ICD-10-CM | POA: Diagnosis not present

## 2014-05-01 DIAGNOSIS — J4489 Other specified chronic obstructive pulmonary disease: Secondary | ICD-10-CM | POA: Insufficient documentation

## 2014-05-01 DIAGNOSIS — S46909A Unspecified injury of unspecified muscle, fascia and tendon at shoulder and upper arm level, unspecified arm, initial encounter: Secondary | ICD-10-CM | POA: Insufficient documentation

## 2014-05-01 DIAGNOSIS — G8929 Other chronic pain: Secondary | ICD-10-CM | POA: Insufficient documentation

## 2014-05-01 DIAGNOSIS — I509 Heart failure, unspecified: Secondary | ICD-10-CM | POA: Diagnosis not present

## 2014-05-01 DIAGNOSIS — Z9861 Coronary angioplasty status: Secondary | ICD-10-CM | POA: Diagnosis not present

## 2014-05-01 MED ORDER — HYDROCODONE-ACETAMINOPHEN 5-325 MG PO TABS: 1.0000 | ORAL_TABLET | Freq: Four times a day (QID) | ORAL | Status: DC | PRN
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
## 2014-05-08 ENCOUNTER — Other Ambulatory Visit: Payer: Self-pay | Admitting: Internal Medicine

## 2014-05-09 ENCOUNTER — Encounter: Payer: Self-pay | Admitting: Internal Medicine

## 2014-05-09 ENCOUNTER — Ambulatory Visit (INDEPENDENT_AMBULATORY_CARE_PROVIDER_SITE_OTHER): Payer: Commercial Managed Care - HMO | Admitting: Internal Medicine

## 2014-05-09 VITALS — BP 130/63 | HR 91 | Temp 98.8°F | Wt 234.1 lb

## 2014-05-09 DIAGNOSIS — J449 Chronic obstructive pulmonary disease, unspecified: Secondary | ICD-10-CM

## 2014-05-09 DIAGNOSIS — D509 Iron deficiency anemia, unspecified: Secondary | ICD-10-CM

## 2014-05-09 DIAGNOSIS — B20 Human immunodeficiency virus [HIV] disease: Secondary | ICD-10-CM

## 2014-05-09 DIAGNOSIS — I509 Heart failure, unspecified: Secondary | ICD-10-CM

## 2014-05-09 DIAGNOSIS — I2510 Atherosclerotic heart disease of native coronary artery without angina pectoris: Secondary | ICD-10-CM

## 2014-05-09 DIAGNOSIS — I10 Essential (primary) hypertension: Secondary | ICD-10-CM

## 2014-05-09 DIAGNOSIS — E785 Hyperlipidemia, unspecified: Secondary | ICD-10-CM

## 2014-05-09 DIAGNOSIS — B009 Herpesviral infection, unspecified: Secondary | ICD-10-CM

## 2014-05-09 DIAGNOSIS — I5042 Chronic combined systolic (congestive) and diastolic (congestive) heart failure: Secondary | ICD-10-CM

## 2014-05-09 DIAGNOSIS — G4700 Insomnia, unspecified: Secondary | ICD-10-CM

## 2014-05-09 DIAGNOSIS — G4733 Obstructive sleep apnea (adult) (pediatric): Secondary | ICD-10-CM

## 2014-05-09 DIAGNOSIS — I639 Cerebral infarction, unspecified: Secondary | ICD-10-CM

## 2014-05-09 DIAGNOSIS — I6350 Cerebral infarction due to unspecified occlusion or stenosis of unspecified cerebral artery: Secondary | ICD-10-CM

## 2014-05-09 DIAGNOSIS — Z79899 Other long term (current) drug therapy: Secondary | ICD-10-CM

## 2014-05-09 MED ORDER — ALBUTEROL SULFATE (2.5 MG/3ML) 0.083% IN NEBU: 2.5000 mg | INHALATION_SOLUTION | Freq: Four times a day (QID) | RESPIRATORY_TRACT | Status: DC | PRN

## 2014-05-09 MED ORDER — FERROUS SULFATE 325 (65 FE) MG PO TABS: 325.0000 mg | ORAL_TABLET | Freq: Every day | ORAL | Status: DC

## 2014-05-09 MED ORDER — OXYCODONE-ACETAMINOPHEN 10-325 MG PO TABS: 1.0000 | ORAL_TABLET | ORAL | Status: DC | PRN

## 2014-05-09 MED ORDER — MORPHINE SULFATE ER 30 MG PO TBCR: 30.0000 mg | EXTENDED_RELEASE_TABLET | Freq: Two times a day (BID) | ORAL | Status: DC

## 2014-05-09 NOTE — Assessment & Plan Note (Signed)
She is on plavix for both H/O CVA and her CAD.

## 2014-05-09 NOTE — Assessment & Plan Note (Addendum)
This took up most of our visit. She was crying at the beginning bus was distracted by Med hx review. Once we started talking about pain later in visit, she started crying again. Her pain is now mostly R ankle. However, even before the ankle began in Oct 2013, she had chronic back pain. Since her gait is now altered 2/2 R ankle, her L knee is also hurting. She was on percocet for her chronic LBP. Now, she is on MSContin 15 BID and percocet 10 #120 per month. She freq takes her MSContin 2 BID which means she runs out early. The 30 mg does take the edge off the pain. She found two left over MSContin and took the two last PM. Otherwise, she had been out for at least a week. She also takes her percocet - 5 on good day and 8 on good day. She "eats them like candy" to try to get pain relief. I neglected to ask her her last percocet dose. I ran her [place] controlled database and she is using [facility] on [address] for all of our Rx. She got 10 hydrocodone from Dr Melanee Spry on 8/26 and used a different pharmacy and I wold her and put in AVS she could not get any meds from any other provider. I elected not to get UDS today. She has real dz, has surgery planned, and I am seeing her in close F/U.   I think she does have uncontrolled pain. There are some red flags but I am willing to work with her. Since she has self increased her MSContin and it does provide some relief, I am Rxing MSContin 30 mg BID. She is coming in one week to see [facility]. My guess is that her pain will still be uncontrolled. At that visit, I would rec 1. UDS 2. Change to either MSContin 30 TID or 45 BID. 3. Leave percocet as it and encourage decreased use 4. Narcan kit (I forgot to offer this and did not ask if she has someone at home - will CC Dr Maudie Mercury)   I am leaving the percocet as is and explained that eventually, she should only need it only occasionally (like once weekly). I think that there is some emotional component to her pain but she has objective source  of pain and overall, has complied.      Prior notes from onother ICD9 dxs which I have deleted : 06/21/2013 Office Visit Edited 06/21/2013 9:52 PM by Lucious Groves, DO   Susie Cassette Figuereo is treated for L knee OA with Percocet 10/325 mg. She is prescribed 90 per month. She takes 4-6 on a typical day, 6-8 on a bad day, and 0 on a good day. She has about 5 good days per month and 20 bad days per month. Her reported usage is not consistent with the prescribed amount. She has not had red flags regarding her opioid usage. I will update her pain contract to number #120. I have printed off or called in 3 months of scripts today and gave one to the patient and held the other two in triage. RAHCEL SHUTES will return in 3 months .  After patient left she reported that she was unable to get pain medication filled at pharmacy due to recently filling previous prescription on 06/15/13. If patient had used 4 pills every 6 hours she would have already used 24 of her 30 pills.      Previous Version  02/14/2013 Office Visit Written 02/14/2013 4:35 PM by Tonia Brooms, MD    Patient has had appointment with orthopedist, reports that they will do a left total knee replacement when she can lose 60 pounds. She 17 pounds closer that goal today.  -In the meantime, pain control with Percocet  -PT referral made today to help with mobility in the setting of severe osteoarthritic pain.       12/05/2012 Office Visit Edited 12/05/2012 4:36 PM by Tonia Brooms, MD    She has severe multi-compartment osteoarthritis of left knee as evidenced by radiograph in June 2013. She sees [facility] (Dr. Berenice Primas), last visit in June 2013. She's told she will require a total knee replacement.  I have contracted her for Percocet for pain management (see assessment following).  In the meantime, have requested records from her orthopedist.    02/08/2014 Office Visit Edited 02/08/2014 11:14 AM by Clinton Gallant, MD    Patient was recently seen and evaluated by Dr. 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 Doran Durand of orthopedics that showed just normal bone tissue without acute inflammation or osteonecrosis. Therefore the patient was discontinued off of antibiotics at that time. The patient had initially responded to corticosteroid injections back in 2013 for several right ankle pain and synovitis and a history of elevated uric acid levels up to 8.1 Repeat levels were done at her 01/30/14 visit and uric acid level was 4. The patient presents today with increasing or worsening pain, unable to bear weight on the ankle, unable to walk even in the unna boot, and significant edema around the medial and lateral malleolus when compared to pictures taken during the 01/31/12 visit. A personal phone call was made to Dr. Drucilla Schmidt fo infectious disease in regards to management and he recommended that if the patient did not respond to the colchicine that it was maybe unlikely 2/2 gout and an acute septic arthritis would still be on the differential. That being said he didn't recommend starting antibiotics given the history of negative cultures and previous negative bone biopsy until an aspirate could be obtained. Therefore his ultimate recommendation would be joint aspiration before any administration of antibiotics and recommended communication with Dr. Nona Dell office. A subsequent call was made by this provider to Dr. Nona Dell office to schedule an urgent appointment for joint aspiration given the above progression of the patient's symptoms. This provider was informed then that no available visits could be scheduled today with Dr. Doran Durand and that there was not an urgent care option at the orthopedics clinic. A request to speak to Dr. Doran Durand personally was made and the secretary took down this  provider's information. This provider didn't receive a call back and the patient was still in excruciating pain. Therefore discussion with the patient was had and the decision was made to proceed to the emergency room for joint aspiration and possible MRI imaging given the collected concern of Dr. Drucilla Schmidt and this provider for ruling out septic arthritis. Therefore the patient was transported to the ED for evaluation and management.       Previous Version       11/08/2013 Office Visit Written 11/08/2013 6:43 PM by Lucious Groves, DO    Patient has had worsening right ankle pain since December 2014, she was recently seen by Dr Doran Durand who suspects osteomyelitis. Bone biopsy  is scheduled for 11/15/13 with Dr. Doran Durand  - Add Mophine Sulphate ER 64m BID.        09/18/2013 Office Visit Written 09/18/2013 5:15 PM by NOthella Boyer MD    [facility] ([facility] was recommended, not candidate for replacement, but fusion would be done by [facility]). Referral placed.

## 2014-05-09 NOTE — Assessment & Plan Note (Signed)
She has Ambien and trazodone to use for sleep. We did not discuss her sleep as visit was focused on pain.

## 2014-05-09 NOTE — Assessment & Plan Note (Signed)
She denies any h/o CHF but her ECHO 2015 showed EF of 25%. She is on an ACEI and BB but no diuretic. She denies any cardiopul sxs today.

## 2014-05-09 NOTE — Assessment & Plan Note (Signed)
Her BP is well controlled today. She takes Coreg 3.125, lisinopril 20, and Imdur 60. Cr is OK.   BP Readings from Last 3 Encounters:  05/09/14 130/63  05/01/14 143/94  04/15/14 109/71

## 2014-05-09 NOTE — Assessment & Plan Note (Signed)
She does have h/o Fe Def but her recent HgB has been nl. She is out of her FESO4 so I refilled it.

## 2014-05-09 NOTE — Assessment & Plan Note (Signed)
She takes Valacyclovir as needed for out breaks.

## 2014-05-09 NOTE — Assessment & Plan Note (Signed)
She uses Alb MDI about twice weekly. She was also D/C'd on Alb Neb once and used it about once weekly when she gets around second hand smoke and gets too much cough to take her MDI. She is not out of her neb and I will refill. She will need PFT's at some point, once her ankle gets worked out.

## 2014-05-09 NOTE — Assessment & Plan Note (Signed)
She does endorse this dx and states she has had stents placed. I need to review chart to get details to add to the overview. She is on a BB, Plavix, and statin.

## 2014-05-09 NOTE — Assessment & Plan Note (Signed)
She is on a statin, crestor 40, and her last LDL was 121 8 months ago. She will need FLP next blood draw.

## 2014-05-09 NOTE — Patient Instructions (Signed)
1. Take MS Contin 30 mg twice a day 2. Take the percocet for breakthrough pain 3. Come back in one week to see how the new regimen is doing 4. You may only get pain meds from the [facility] (outpatient clinic). 5. If you have an emergency and need other pain meds from another doctor when we are not open, you must call use when we open and let us know

## 2014-05-09 NOTE — Assessment & Plan Note (Signed)
She sees Dr Tommy Medal. She is on [place]. Her last CD4 was 420 and VL < 20.

## 2014-05-09 NOTE — Assessment & Plan Note (Signed)
She had gone to a weight loss MD who Rx'd Topamax and she loss from 275 to 250 lbs over about one yr. Now that her R ankle is messed up, she is not able to exercise and she is no longer losing the weight. She is no longer taking the med and I removed it from the med list. Once her ankle is corrected, the benefits of the med might outweigh the risks and I might consider restarting it.

## 2014-05-09 NOTE — Assessment & Plan Note (Signed)
She denies this dx. Did have sleep study and I will need to look for results. Doesn't use CPAP.

## 2014-05-09 NOTE — Progress Notes (Signed)
   Subjective:    Patient ID: [name], female    DOB: [birth year], 53 y.o.   MRN: [id number]  Ankle Pain     Please see the A&P for the status of the pt's chronic medical problems. This is my first appt with [name]. The focus of the appt was to clean up her PL, meds, and address her pain.   Review of Systems  Constitutional: Negative for unexpected weight change.  Genitourinary: Negative for menstrual problem.  Musculoskeletal: Positive for arthralgias, back pain and gait problem.  Skin: Positive for wound.  Neurological: Positive for weakness.  Psychiatric/Behavioral: Positive for sleep disturbance. The patient is nervous/anxious.        Objective:   Physical Exam  Constitutional: She is oriented to person, place, and time. She appears well-developed and well-nourished. No distress.  Tearful but easily distracted. In [place]. Obese. Boot on R LE.   HENT:  Head: Normocephalic and atraumatic.  Right Ear: External ear normal.  Left Ear: External ear normal.  Nose: Nose normal.  Eyes: Conjunctivae and EOM are normal.  Pulmonary/Chest: Effort normal.  Neurological: She is alert and oriented to person, place, and time.  Skin: She is not diaphoretic.  Psychiatric: She has a normal mood and affect. Her speech is normal and behavior is normal. Judgment and thought content normal. Cognition and memory are normal.          Assessment & Plan:

## 2014-05-10 ENCOUNTER — Telehealth: Payer: Self-pay | Admitting: *Deleted

## 2014-05-10 LAB — AFB CULTURE WITH SMEAR (NOT AT ARMC)
ACID FAST SMEAR: NONE SEEN
Acid Fast Smear: NONE SEEN
Acid Fast Smear: NONE SEEN

## 2014-05-10 NOTE — Telephone Encounter (Signed)
Open chart in error

## 2014-05-10 NOTE — Telephone Encounter (Signed)
I am OK if she uses another pharmacy once for MSContin. I cannot help with percocet cost.   Dr Naaman Plummer - pt will not be on desired and planned opioid pain med regimen when she sees you so take that into consideration when you see her.

## 2014-05-10 NOTE — Telephone Encounter (Signed)
Pt calls and states she cannot get this at [facility], they are out of stock, she states she is going to [facility] [address], i told her to wait and i called, the pharmacist states they are out of stock, i then called [facility] on [address] and they have it, i have sent her there for the med

## 2014-05-10 NOTE — Telephone Encounter (Signed)
Pt called about pain med - uses [facility] - had no MS Contin, it would be Tues or Thurs before meds arrive. Unable to get Percocet till 05/18/14 due to state laws. Talked with Dr Lynnae January will ok pt to go to pharmacy x 1 ([facility]) since they are out MS Contin and it is not stated on pain contract. Dr Lynnae January suggest pt can pay out of pocket for Percocet this time. Pt states does not have money for Percocets. Hilda Blades Elpidia Karn RN 05/10/14 12N

## 2014-05-16 ENCOUNTER — Ambulatory Visit (INDEPENDENT_AMBULATORY_CARE_PROVIDER_SITE_OTHER): Payer: Commercial Managed Care - HMO | Admitting: Internal Medicine

## 2014-05-16 ENCOUNTER — Encounter: Payer: Self-pay | Admitting: Internal Medicine

## 2014-05-16 VITALS — BP 118/99 | HR 82 | Temp 98.3°F | Ht 62.0 in | Wt 232.1 lb

## 2014-05-16 DIAGNOSIS — G894 Chronic pain syndrome: Secondary | ICD-10-CM | POA: Diagnosis not present

## 2014-05-16 DIAGNOSIS — Z0000 Encounter for general adult medical examination without abnormal findings: Secondary | ICD-10-CM | POA: Insufficient documentation

## 2014-05-16 DIAGNOSIS — I10 Essential (primary) hypertension: Secondary | ICD-10-CM | POA: Diagnosis not present

## 2014-05-16 MED ORDER — NALOXONE HCL 1 MG/ML IJ SOLN: 1.0000 mg | INTRAMUSCULAR | Status: DC | PRN

## 2014-05-16 NOTE — Assessment & Plan Note (Signed)
Pt declined annual influenza vaccination today on 05/16/14.

## 2014-05-16 NOTE — Progress Notes (Signed)
Patient ID: [name], female   DOB: 1961/01/03, 53 y.o.   MRN: [id number]  Internal Medicine Clinic Attending Date of visit: 05/16/2014   Case discussed with Dr. Naaman Plummer soon after the resident saw the patient.  We reviewed the resident's history and exam and pertinent patient test results.  I agree with the assessment, diagnosis, and plan of care documented in the resident's note.

## 2014-05-16 NOTE — Progress Notes (Signed)
Patient ID: [name], female   DOB: [birth year], 53 y.o.   MRN: [id number]    Subjective:   Patient ID: [name] female   DOB: 07-Mar-1961 53 y.o.   MRN: [id number]  HPI: [name] is a 53 y.o. woman with past medical history of hypertension, hyperlipidemia, ischemic CVA, COPD, HIV (last CD4 25 and <20 VL on 01/16/14), CAD, combined chronic CHF, iron-deficiency anemia, chronic right ankle pain, anxiety/depression, and OSA who presents for follow-up visit of chronic pain.   At last visit with her PCP 1 week ago, her MS contin was increased from 15 to 30 mg BID which she states has not helped control her pain. She continues to have uncontrolled pain present mostly in her right ankle but also in her left knee and both arms. She recently attended a cookout but due to pain had to leave [name]. Her pain continues to interfere with her daily activities. She denies recent fall and uses a walker and wheelchair to ambulate. She has a boot on her right foot and reports her upcoming right ankle surgery is in November. She inquires about roxicet as she heard through relatives that this medication may be more effective than her current percocet which she ran out of approximately 1 week ago but is able to pick up in 2 days. On a bad day she reports she will sometimes take 8 percocet daily. She has been somnolent recently and her daughter had to awake her from sleep. She does not have narcan at home. She denies loss of consciousness or syncope.             Past Medical History  Diagnosis Date  . Cholecystitis     Gall bladder removed   . Hypercholesterolemia   . Fibroids   . HIV (human immunodeficiency virus infection) 05/27/11    CD4 460, VL undetectable 10/12/12  . Pelvic pain in female 10/14/11  . PMB (postmenopausal bleeding)     Since 2010  . Increased BMI 05/27/11  . Anxiety   . Depression   . Hypertension   . H/O varicella   . History of measles, mumps, or rubella   . Blood  transfusion 1995    "related to losing blood in urine during pregnancy"  . Hypothyroidism     Reports h/o hypothyroidism; not on any meds, TSH wnl over several years  . 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 Myocardial infarction 07/2004  . COPD (chronic obstructive pulmonary disease)     well controlled, only using albuterol inhaler occasionally   . Iron deficiency anemia   . Lower GI bleed 04/24/2012    from hemorrhoids. No recent colonoscopy   . CAD in native artery     s/p stent and restenting of LAD. On plavix  . Anginal pain   . Pneumonia 1980's    "once"  . Chronic bronchitis     "get it q yr" (09/14/2013)  . Stroke 12/2011    R insular ischemic CVA ; residual "speech messes up when I get sick or real tired" (09/14/2013)  . Arthritis     "knees; left shoulder; right anklef/foot" (09/14/2013)  . Bursitis, shoulder     "right"  . Chronic lower back pain   . Carpal tunnel syndrome, bilateral     wears slint bilateral  . History of loop recorder    Current Outpatient Prescriptions  Medication  Sig Dispense Refill  . albuterol (PROVENTIL HFA;VENTOLIN HFA) 108 (90 BASE) MCG/ACT inhaler Inhale 2 puffs into the lungs every 6 (six) hours as needed for wheezing or shortness of breath.  6.7 g  11  . albuterol (PROVENTIL) (2.5 MG/3ML) 0.083% nebulizer solution Take 3 mLs (2.5 mg total) by nebulization every 6 (six) hours as needed for wheezing or shortness of breath. Use MDI first. Use neb if unable to use MDI.  75 mL  1  . beclomethasone (QVAR) 80 MCG/ACT inhaler Inhale 2 puffs into the lungs 2 (two) times daily.      . carvedilol (COREG) 3.125 MG tablet Take 1 tablet (3.125 mg total) by mouth 2 (two) times daily with a meal. 9am and 6pm  60 tablet  5  . clopidogrel (PLAVIX) 75 MG tablet Take 75 mg by mouth daily.      . diphenoxylate-atropine (LOMOTIL) 2.5-0.025 MG per tablet Take 1 tablet by mouth 4 (four) times daily  as needed for diarrhea or loose stools.       . elvitegravir-cobicistat-emtricitabine-tenofovir (STRIBILD) 150-150-200-300 MG TABS tablet Take 1 tablet by mouth daily with breakfast.  30 tablet  11  . ferrous sulfate 325 (65 FE) MG tablet Take 1 tablet (325 mg total) by mouth daily with breakfast.  30 tablet  11  . HYDROcodone-acetaminophen (NORCO/VICODIN) 5-325 MG per tablet Take 1 tablet by mouth every 6 (six) hours as needed for moderate pain or severe pain.  10 tablet  0  . isosorbide mononitrate (IMDUR) 60 MG 24 hr tablet Take 1 tablet (60 mg total) by mouth every evening. 5pm  30 tablet  5  . lisinopril (PRINIVIL,ZESTRIL) 20 MG tablet Take 1 tablet (20 mg total) by mouth daily.  30 tablet  1  . lubiprostone (AMITIZA) 24 MCG capsule Take 24 mcg by mouth at bedtime as needed for constipation.      Marland Kitchen morphine (MS CONTIN) 30 MG 12 hr tablet Take 1 tablet (30 mg total) by mouth every 12 (twelve) hours.  60 tablet  0  . Multiple Vitamin (MULTIVITAMIN WITH MINERALS) TABS Take 1 tablet by mouth daily.      . naproxen sodium (ANAPROX) 220 MG tablet Take 440 mg by mouth 2 (two) times daily as needed (pain).      . nitroGLYCERIN (NITROSTAT) 0.4 MG SL tablet Place 0.4 mg under the tongue every 5 (five) minutes as needed for chest pain.      Marland Kitchen oxyCODONE-acetaminophen (PERCOCET) 10-325 MG per tablet Take 1 tablet by mouth every 4 (four) hours as needed for pain.  120 tablet  0  . pantoprazole (PROTONIX) 20 MG tablet Take 20 mg by mouth daily.      . rosuvastatin (CRESTOR) 40 MG tablet Take 1 tablet (40 mg total) by mouth daily at 6 PM.  30 tablet  0  . traZODone (DESYREL) 50 MG tablet Take 50 mg by mouth at bedtime.      . valACYclovir (VALTREX) 500 MG tablet Take 500 mg by mouth daily as needed (for herpes breakouts).       . zolpidem (AMBIEN) 5 MG tablet Take 1 tablet (5 mg total) by mouth at bedtime as needed for sleep.  30 tablet  0   No current facility-administered medications for this visit.    Facility-Administered Medications Ordered in Other Visits  Medication Dose Route Frequency Provider Last Rate Last Dose  . bupivacaine-epinephrine (MARCAINE W/ EPI) 0.5% -1:200000 injection    Anesthesia Intra-op Albertha Ghee, MD  30 mL at 03/14/14 1449   Family History  Problem Relation Age of Onset  . Hypertension Mother   . Hyperlipidemia Mother   . Obesity Mother   . Heart disease Mother   . Hypertension Maternal Aunt   . Hyperlipidemia Maternal Aunt   . Obesity Maternal Aunt   . Stroke Maternal Aunt    History   Social History  . Marital Status: Single    Spouse Name: N/A    Number of Children: N/A  . Years of Education: N/A   Social History Main Topics  . Smoking status: Former Smoker -- 0.50 packs/day for 29 years    Types: Cigarettes    Quit date: 05/07/2006  . Smokeless tobacco: Never Used  . Alcohol Use: 0.0 oz/week     Comment: Occasional drinker. Wine  . Drug Use: No     Comment:   pt. states has quit drug use and not done any drugs  since 3/201"  . Sexual Activity: None     Comment: gave her condoms   Other Topics Concern  . None   Social History Narrative  . None   Review of Systems: Review of Systems  Constitutional: Negative for fever and chills.  Respiratory: Negative for cough, shortness of breath and wheezing.   Cardiovascular: Negative for chest pain and leg swelling.  Gastrointestinal: Positive for abdominal pain (chronic on left side). Negative for nausea, vomiting, diarrhea and constipation.  Genitourinary: Negative for dysuria, urgency and frequency.  Musculoskeletal: Positive for joint pain (right ankle, left knee). Negative for falls.       Bilateral upper extremity pain  Neurological: Negative for dizziness and loss of consciousness.  Psychiatric/Behavioral:       Somnolence    Objective:  Physical Exam: Filed Vitals:   05/16/14 1102  BP: 118/99  Pulse: 82  Temp: 98.3 F (36.8 C)  TempSrc: Oral  Height: 5\' 2"  (1.575 m)   Weight: 232 lb 1.6 oz (105.28 kg)  SpO2: 99%    Physical Exam  Constitutional: She is oriented to person, place, and time. She appears well-developed and well-nourished. She appears distressed (Crying and tearful throughout exam).  HENT:  Head: Normocephalic and atraumatic.  Eyes: EOM are normal.  Neck: Normal range of motion. Neck supple.  Cardiovascular: Normal rate and regular rhythm.   Distant heart sounds  Pulmonary/Chest: Effort normal. No respiratory distress. She has no wheezes. She has no rales.  Decreased breath sounds  Abdominal: Soft. Bowel sounds are normal. She exhibits no distension. There is no tenderness. There is no rebound and no guarding.  Musculoskeletal: Normal range of motion. She exhibits no edema and no tenderness.  Right foot with boot on. Mild pain with dorsiflex/plantarflexion.  Neurological: She is alert and oriented to person, place, and time.  Normal 5/5 LE muscle strength  Skin: Skin is warm and dry. No rash noted. She is not diaphoretic. No erythema. No pallor.  Psychiatric: She has a normal mood and affect. Her behavior is normal. Judgment and thought content normal.    Assessment & Plan:   Please see problem list for problem-based assessment and plan

## 2014-05-16 NOTE — Assessment & Plan Note (Addendum)
Assessment: Pt with chronic right ankle pain and left knee pain who presents with uncontrolled pain on narcotic therapy with no change since recent increased dosage of morphine.   Plan:  -Pt instructed to increase MS Contin from 30 mg BID to 30 mg TID (I did not refill this medication because it was just prescribed 1 week ago. She will need a refill sooner, to be addressed by PCP) -Continue oxycodone-acetaminophen 10-325 mg Q 4 hr PRN pain, pt able to fill this medication in two days on 05/18/14 -Pharmacist Dr. Maudie Mercury gave 2 samples of nasal naloxone to pt that is to be used in setting of overdose. She instructed her on proper usage. -Obtain UDS (presciption abuse monitoring panel 15), pt reports last MS contin use was this AM and last percocet usage was approximately 1 week ago.  -Pt to follow-up with PCP before upcoming surgery to discuss pain management options if current therapy not effective

## 2014-05-16 NOTE — Assessment & Plan Note (Addendum)
Assessment: Pt with well-controlled hypertension compliant with three-class (BB, ACEi, & nitrate) anti-hypertensive therapy who presents with blood pressure of 118/99.   Plan:  -BP 118/99 at goal <140/90 -Continue carvedilol 3.25 mg BID, imdur 60 mg daily, and lisinopril 20 mg daily -Last CMP on 03/07/13

## 2014-05-16 NOTE — Patient Instructions (Addendum)
-  Start taking MS contin 30 mg three times daily for pain -You can fill your percocet prescription on Saturday -Please use naloxone in case of narcotic overdose -Dr Lynnae January will see you back next time, pleasure meeting you!    Naloxone (also called Narcan) is an antidote for opioid overdose. It works by neutralizing the opioids in your system and helping you breathe again. Naloxone only works if a person has opioids in their system; the medication doesn't work on other drugs. You can't get high from it and it is safe for nearly everyone. It's a Therapist, sports and has been used in programs all over the world.   The main sign of overdose is unresponsiveness. Other signs include:  Not breathing  Turning blue  Deep snoring  Vomiting  Gasping, gurgling  If you suspect an overdose, 1. CALL 911 2. Start rescue breathing   3. To give nasal naloxone:   If possible, put on gloves  Remove protective caps from the vial and the injector    Twist the vial into the injector until the needle penetrates the stopper, usually 3 half turns (do not push!)    Remove the cover from the injector   Twist on the intranasal mucosal atomization device    Spray 1 mL (same as 1 mg) of naloxone into each nostril    Continue rescue breathing  Watch the patient for breathing and signs of a response  If no response in 2 to 5 minutes, give the second dose  Store naloxone at room temperature. Protect from light. Do not freeze.   General Instructions:   Please bring your medicines with you each time you come to clinic.  Medicines may include prescription medications, over-the-counter medications, herbal remedies, eye drops, vitamins, or other pills.   Progress Toward Treatment Goals:  Treatment Goal 11/08/2013  Blood pressure at goal  Prevent falls -    Self Care Goals & Plans:  Self Care Goal 05/16/2014  Manage my medications take my medicines as prescribed; bring my medications to every  visit; refill my medications on time; follow the sick day instructions if I am sick  Monitor my health keep track of my weight  Eat healthy foods eat more vegetables; eat fruit for snacks and desserts; eat baked foods instead of fried foods; eat foods that are low in salt; eat smaller portions; drink diet soda or water instead of juice or soda  Be physically active find an activity I enjoy  Other -    No flowsheet data found.   Care Management & Community Referrals:  Referral 11/08/2013  Referrals made for care management support nutritionist  Referrals made to community resources -

## 2014-05-17 LAB — PRESCRIPTION ABUSE MONITORING 15P, URINE
Amphetamine/Meth: NEGATIVE ng/mL
Barbiturate Screen, Urine: NEGATIVE ng/mL
Benzodiazepine Screen, Urine: NEGATIVE ng/mL
Buprenorphine, Urine: NEGATIVE ng/mL
CREATININE, URINE: 234.51 mg/dL (ref >20.0)
Cannabinoid Scrn, Ur: NEGATIVE ng/mL
Carisoprodol, Urine: NEGATIVE ng/mL
Fentanyl, Ur: NEGATIVE ng/mL
METHADONE SCREEN, URINE: NEGATIVE ng/mL
Meperidine, Ur: NEGATIVE ng/mL
Propoxyphene: NEGATIVE ng/mL
Tramadol Scrn, Ur: NEGATIVE ng/mL
ZOLPIDEM, URINE: NEGATIVE ng/mL

## 2014-05-17 LAB — MDC_IDC_ENUM_SESS_TYPE_REMOTE

## 2014-05-19 LAB — OXYCODONE, URINE (LC/MS-MS)
NOROXYCODONE, UR: 62 ng/mL — AB (ref <50)
OXYMORPHONE, URINE: NEGATIVE ng/mL (ref <50)
Oxycodone, ur: 1765 ng/mL — ABNORMAL HIGH (ref <50)

## 2014-05-19 LAB — OPIATES/OPIOIDS (LC/MS-MS)
Codeine Urine: NEGATIVE ng/mL (ref <50)
HYDROCODONE: NEGATIVE ng/mL (ref <50)
Hydromorphone: NEGATIVE ng/mL (ref <50)
Morphine Urine: 41043 ng/mL — ABNORMAL HIGH (ref <50)
Norhydrocodone, Ur: NEGATIVE ng/mL (ref <50)
Noroxycodone, Ur: 62 ng/mL — ABNORMAL HIGH (ref <50)
Oxycodone, ur: 1765 ng/mL — ABNORMAL HIGH (ref <50)
Oxymorphone: NEGATIVE ng/mL (ref <50)

## 2014-05-19 LAB — COCAINE METABOLITE (GC/LC/MS), URINE: BENZOYLECGONINE GC/MS CONF: 746 ng/mL — AB (ref <100)

## 2014-05-20 ENCOUNTER — Encounter: Payer: Self-pay | Admitting: Internal Medicine

## 2014-05-21 ENCOUNTER — Encounter: Payer: Self-pay | Admitting: Internal Medicine

## 2014-05-21 LAB — MDC_IDC_ENUM_SESS_TYPE_REMOTE: Date Time Interrogation Session: 20150806040500

## 2014-05-23 ENCOUNTER — Encounter: Payer: Self-pay | Admitting: Internal Medicine

## 2014-05-27 ENCOUNTER — Ambulatory Visit (INDEPENDENT_AMBULATORY_CARE_PROVIDER_SITE_OTHER): Payer: Commercial Managed Care - HMO | Admitting: *Deleted

## 2014-05-27 DIAGNOSIS — I639 Cerebral infarction, unspecified: Secondary | ICD-10-CM

## 2014-05-27 DIAGNOSIS — I6350 Cerebral infarction due to unspecified occlusion or stenosis of unspecified cerebral artery: Secondary | ICD-10-CM

## 2014-05-28 ENCOUNTER — Other Ambulatory Visit: Payer: Self-pay | Admitting: *Deleted

## 2014-05-28 DIAGNOSIS — Z79899 Other long term (current) drug therapy: Secondary | ICD-10-CM

## 2014-05-28 MED ORDER — MORPHINE SULFATE ER 30 MG PO TBCR: 30.0000 mg | EXTENDED_RELEASE_TABLET | Freq: Three times a day (TID) | ORAL | Status: DC

## 2014-05-28 NOTE — Telephone Encounter (Signed)
Pt informed Rx is ready 

## 2014-05-28 NOTE — Telephone Encounter (Signed)
Too early. Last filled 9/12.

## 2014-05-28 NOTE — Telephone Encounter (Signed)
Changed to 3 times a day - per Dr Naaman Plummer; see AVS sheet. Thanks

## 2014-05-29 ENCOUNTER — Encounter: Payer: Self-pay | Admitting: Internal Medicine

## 2014-05-29 ENCOUNTER — Encounter (HOSPITAL_COMMUNITY): Payer: Self-pay | Admitting: Emergency Medicine

## 2014-05-29 ENCOUNTER — Inpatient Hospital Stay (HOSPITAL_COMMUNITY)
Admission: EM | Admit: 2014-05-29 | Discharge: 2014-06-02 | DRG: 065 | Disposition: A | Discharge status: Skilled Nursing Facility | Payer: Medicare HMO | Attending: Internal Medicine | Admitting: Internal Medicine

## 2014-05-29 DIAGNOSIS — M129 Arthropathy, unspecified: Secondary | ICD-10-CM | POA: Diagnosis present

## 2014-05-29 DIAGNOSIS — B20 Human immunodeficiency virus [HIV] disease: Secondary | ICD-10-CM | POA: Diagnosis present

## 2014-05-29 DIAGNOSIS — G8929 Other chronic pain: Secondary | ICD-10-CM | POA: Diagnosis present

## 2014-05-29 DIAGNOSIS — R4182 Altered mental status, unspecified: Secondary | ICD-10-CM | POA: Diagnosis not present

## 2014-05-29 DIAGNOSIS — F411 Generalized anxiety disorder: Secondary | ICD-10-CM | POA: Diagnosis present

## 2014-05-29 DIAGNOSIS — I6340 Cerebral infarction due to embolism of unspecified cerebral artery: Secondary | ICD-10-CM | POA: Diagnosis not present

## 2014-05-29 DIAGNOSIS — Z791 Long term (current) use of non-steroidal anti-inflammatories (NSAID): Secondary | ICD-10-CM

## 2014-05-29 DIAGNOSIS — I252 Old myocardial infarction: Secondary | ICD-10-CM

## 2014-05-29 DIAGNOSIS — Z21 Asymptomatic human immunodeficiency virus [HIV] infection status: Secondary | ICD-10-CM | POA: Diagnosis present

## 2014-05-29 DIAGNOSIS — R29898 Other symptoms and signs involving the musculoskeletal system: Secondary | ICD-10-CM | POA: Diagnosis present

## 2014-05-29 DIAGNOSIS — E785 Hyperlipidemia, unspecified: Secondary | ICD-10-CM | POA: Diagnosis present

## 2014-05-29 DIAGNOSIS — Z6841 Body Mass Index (BMI) 40.0 and over, adult: Secondary | ICD-10-CM

## 2014-05-29 DIAGNOSIS — J449 Chronic obstructive pulmonary disease, unspecified: Secondary | ICD-10-CM | POA: Diagnosis present

## 2014-05-29 DIAGNOSIS — I639 Cerebral infarction, unspecified: Secondary | ICD-10-CM | POA: Diagnosis present

## 2014-05-29 DIAGNOSIS — F329 Major depressive disorder, single episode, unspecified: Secondary | ICD-10-CM | POA: Diagnosis present

## 2014-05-29 DIAGNOSIS — D649 Anemia, unspecified: Secondary | ICD-10-CM | POA: Diagnosis present

## 2014-05-29 DIAGNOSIS — I10 Essential (primary) hypertension: Secondary | ICD-10-CM | POA: Diagnosis present

## 2014-05-29 DIAGNOSIS — R4701 Aphasia: Secondary | ICD-10-CM

## 2014-05-29 DIAGNOSIS — Z7902 Long term (current) use of antithrombotics/antiplatelets: Secondary | ICD-10-CM

## 2014-05-29 DIAGNOSIS — M545 Low back pain, unspecified: Secondary | ICD-10-CM | POA: Diagnosis present

## 2014-05-29 DIAGNOSIS — Z9861 Coronary angioplasty status: Secondary | ICD-10-CM

## 2014-05-29 DIAGNOSIS — G4733 Obstructive sleep apnea (adult) (pediatric): Secondary | ICD-10-CM | POA: Diagnosis present

## 2014-05-29 DIAGNOSIS — J4489 Other specified chronic obstructive pulmonary disease: Secondary | ICD-10-CM | POA: Diagnosis present

## 2014-05-29 DIAGNOSIS — Z888 Allergy status to other drugs, medicaments and biological substances status: Secondary | ICD-10-CM

## 2014-05-29 DIAGNOSIS — K589 Irritable bowel syndrome without diarrhea: Secondary | ICD-10-CM | POA: Diagnosis present

## 2014-05-29 DIAGNOSIS — Z8249 Family history of ischemic heart disease and other diseases of the circulatory system: Secondary | ICD-10-CM

## 2014-05-29 DIAGNOSIS — F3289 Other specified depressive episodes: Secondary | ICD-10-CM | POA: Diagnosis present

## 2014-05-29 DIAGNOSIS — I509 Heart failure, unspecified: Secondary | ICD-10-CM | POA: Diagnosis present

## 2014-05-29 DIAGNOSIS — I5022 Chronic systolic (congestive) heart failure: Secondary | ICD-10-CM

## 2014-05-29 DIAGNOSIS — I2510 Atherosclerotic heart disease of native coronary artery without angina pectoris: Secondary | ICD-10-CM | POA: Diagnosis present

## 2014-05-29 DIAGNOSIS — E876 Hypokalemia: Secondary | ICD-10-CM | POA: Diagnosis not present

## 2014-05-29 DIAGNOSIS — F1410 Cocaine abuse, uncomplicated: Secondary | ICD-10-CM | POA: Diagnosis present

## 2014-05-29 DIAGNOSIS — I5042 Chronic combined systolic (congestive) and diastolic (congestive) heart failure: Secondary | ICD-10-CM | POA: Diagnosis present

## 2014-05-29 DIAGNOSIS — F801 Expressive language disorder: Secondary | ICD-10-CM | POA: Diagnosis present

## 2014-05-29 LAB — CBC WITH DIFFERENTIAL/PLATELET
BASOS PCT: 0 % (ref 0–1)
Basophils Absolute: 0 10*3/uL (ref 0.0–0.1)
Eosinophils Absolute: 0.1 10*3/uL (ref 0.0–0.7)
Eosinophils Relative: 2 % (ref 0–5)
HCT: 39.9 % (ref 36.0–46.0)
Hemoglobin: 12.6 g/dL (ref 12.0–15.0)
Lymphocytes Relative: 39 % (ref 12–46)
Lymphs Abs: 1.5 10*3/uL (ref 0.7–4.0)
MCH: 24.7 pg — ABNORMAL LOW (ref 26.0–34.0)
MCHC: 31.6 g/dL (ref 30.0–36.0)
MCV: 78.1 fL (ref 78.0–100.0)
Monocytes Absolute: 0.3 10*3/uL (ref 0.1–1.0)
Monocytes Relative: 8 % (ref 3–12)
NEUTROS PCT: 51 % (ref 43–77)
Neutro Abs: 2 10*3/uL (ref 1.7–7.7)
PLATELETS: 215 10*3/uL (ref 150–400)
RBC: 5.11 MIL/uL (ref 3.87–5.11)
RDW: 14.7 % (ref 11.5–15.5)
WBC: 4 10*3/uL (ref 4.0–10.5)

## 2014-05-29 LAB — COMPREHENSIVE METABOLIC PANEL
ALK PHOS: 59 U/L (ref 39–117)
ALT: 9 U/L (ref 0–35)
AST: 19 U/L (ref 0–37)
Albumin: 3.5 g/dL (ref 3.5–5.2)
Anion gap: 12 (ref 5–15)
BILIRUBIN TOTAL: 0.6 mg/dL (ref 0.3–1.2)
BUN: 9 mg/dL (ref 6–23)
CO2: 24 mEq/L (ref 19–32)
Calcium: 9 mg/dL (ref 8.4–10.5)
Chloride: 103 mEq/L (ref 96–112)
Creatinine, Ser: 0.77 mg/dL (ref 0.50–1.10)
GFR calc Af Amer: >90 mL/min (ref >90)
GFR calc non Af Amer: >90 mL/min (ref >90)
Glucose, Bld: 125 mg/dL — ABNORMAL HIGH (ref 70–99)
POTASSIUM: 2.9 meq/L — AB (ref 3.7–5.3)
SODIUM: 139 meq/L (ref 137–147)
TOTAL PROTEIN: 8.1 g/dL (ref 6.0–8.3)

## 2014-05-29 NOTE — ED Notes (Signed)
Pt monitored by pulse ox, bp cuff, and 5-lead. 

## 2014-05-29 NOTE — ED Notes (Signed)
Pt. reports intermittent confusion / disoriented onset last  week , alert and oriented at arrival , speech clear / no facial asymmetry , respirations unlabored/ambulatory.

## 2014-05-30 ENCOUNTER — Inpatient Hospital Stay (HOSPITAL_COMMUNITY): Payer: Medicare HMO

## 2014-05-30 ENCOUNTER — Emergency Department (HOSPITAL_COMMUNITY): Payer: Medicare HMO

## 2014-05-30 ENCOUNTER — Encounter: Payer: Self-pay | Admitting: Internal Medicine

## 2014-05-30 ENCOUNTER — Encounter (HOSPITAL_COMMUNITY): Payer: Self-pay | Admitting: General Practice

## 2014-05-30 DIAGNOSIS — G8929 Other chronic pain: Secondary | ICD-10-CM | POA: Diagnosis present

## 2014-05-30 DIAGNOSIS — M129 Arthropathy, unspecified: Secondary | ICD-10-CM | POA: Diagnosis present

## 2014-05-30 DIAGNOSIS — Z21 Asymptomatic human immunodeficiency virus [HIV] infection status: Secondary | ICD-10-CM | POA: Diagnosis present

## 2014-05-30 DIAGNOSIS — Z791 Long term (current) use of non-steroidal anti-inflammatories (NSAID): Secondary | ICD-10-CM | POA: Diagnosis not present

## 2014-05-30 DIAGNOSIS — E785 Hyperlipidemia, unspecified: Secondary | ICD-10-CM | POA: Diagnosis present

## 2014-05-30 DIAGNOSIS — Z8249 Family history of ischemic heart disease and other diseases of the circulatory system: Secondary | ICD-10-CM | POA: Diagnosis not present

## 2014-05-30 DIAGNOSIS — F801 Expressive language disorder: Secondary | ICD-10-CM | POA: Diagnosis present

## 2014-05-30 DIAGNOSIS — Z7902 Long term (current) use of antithrombotics/antiplatelets: Secondary | ICD-10-CM | POA: Diagnosis not present

## 2014-05-30 DIAGNOSIS — K589 Irritable bowel syndrome without diarrhea: Secondary | ICD-10-CM | POA: Diagnosis present

## 2014-05-30 DIAGNOSIS — I5042 Chronic combined systolic (congestive) and diastolic (congestive) heart failure: Secondary | ICD-10-CM

## 2014-05-30 DIAGNOSIS — F329 Major depressive disorder, single episode, unspecified: Secondary | ICD-10-CM | POA: Diagnosis present

## 2014-05-30 DIAGNOSIS — Z6841 Body Mass Index (BMI) 40.0 and over, adult: Secondary | ICD-10-CM | POA: Diagnosis not present

## 2014-05-30 DIAGNOSIS — I639 Cerebral infarction, unspecified: Secondary | ICD-10-CM | POA: Diagnosis present

## 2014-05-30 DIAGNOSIS — I509 Heart failure, unspecified: Secondary | ICD-10-CM | POA: Diagnosis present

## 2014-05-30 DIAGNOSIS — Z9861 Coronary angioplasty status: Secondary | ICD-10-CM | POA: Diagnosis not present

## 2014-05-30 DIAGNOSIS — D649 Anemia, unspecified: Secondary | ICD-10-CM | POA: Diagnosis present

## 2014-05-30 DIAGNOSIS — M545 Low back pain, unspecified: Secondary | ICD-10-CM | POA: Diagnosis present

## 2014-05-30 DIAGNOSIS — F411 Generalized anxiety disorder: Secondary | ICD-10-CM | POA: Diagnosis present

## 2014-05-30 DIAGNOSIS — I6340 Cerebral infarction due to embolism of unspecified cerebral artery: Secondary | ICD-10-CM | POA: Diagnosis present

## 2014-05-30 DIAGNOSIS — F1410 Cocaine abuse, uncomplicated: Secondary | ICD-10-CM | POA: Diagnosis present

## 2014-05-30 DIAGNOSIS — E876 Hypokalemia: Secondary | ICD-10-CM | POA: Diagnosis not present

## 2014-05-30 DIAGNOSIS — R29898 Other symptoms and signs involving the musculoskeletal system: Secondary | ICD-10-CM | POA: Diagnosis present

## 2014-05-30 DIAGNOSIS — J449 Chronic obstructive pulmonary disease, unspecified: Secondary | ICD-10-CM | POA: Diagnosis present

## 2014-05-30 DIAGNOSIS — I10 Essential (primary) hypertension: Secondary | ICD-10-CM | POA: Diagnosis present

## 2014-05-30 DIAGNOSIS — Z888 Allergy status to other drugs, medicaments and biological substances status: Secondary | ICD-10-CM | POA: Diagnosis not present

## 2014-05-30 DIAGNOSIS — G4733 Obstructive sleep apnea (adult) (pediatric): Secondary | ICD-10-CM | POA: Diagnosis present

## 2014-05-30 DIAGNOSIS — R4182 Altered mental status, unspecified: Secondary | ICD-10-CM | POA: Diagnosis present

## 2014-05-30 DIAGNOSIS — I6350 Cerebral infarction due to unspecified occlusion or stenosis of unspecified cerebral artery: Secondary | ICD-10-CM

## 2014-05-30 DIAGNOSIS — I2510 Atherosclerotic heart disease of native coronary artery without angina pectoris: Secondary | ICD-10-CM | POA: Diagnosis present

## 2014-05-30 DIAGNOSIS — F3289 Other specified depressive episodes: Secondary | ICD-10-CM | POA: Diagnosis present

## 2014-05-30 DIAGNOSIS — I252 Old myocardial infarction: Secondary | ICD-10-CM | POA: Diagnosis not present

## 2014-05-30 LAB — LIPID PANEL
CHOLESTEROL: 186 mg/dL (ref 0–200)
HDL: 43 mg/dL (ref >39)
LDL Cholesterol: 126 mg/dL — ABNORMAL HIGH (ref 0–99)
Total CHOL/HDL Ratio: 4.3 RATIO
Triglycerides: 86 mg/dL (ref <150)
VLDL: 17 mg/dL (ref 0–40)

## 2014-05-30 LAB — TROPONIN I
Troponin I: <0.3 ng/mL (ref <0.30)
Troponin I: <0.3 ng/mL (ref <0.30)

## 2014-05-30 LAB — URINALYSIS, ROUTINE W REFLEX MICROSCOPIC
BILIRUBIN URINE: NEGATIVE
GLUCOSE, UA: NEGATIVE mg/dL
KETONES UR: NEGATIVE mg/dL
Leukocytes, UA: NEGATIVE
Nitrite: NEGATIVE
Protein, ur: NEGATIVE mg/dL
Specific Gravity, Urine: 1.018 (ref 1.005–1.030)
Urobilinogen, UA: 1 mg/dL (ref 0.0–1.0)
pH: 6 (ref 5.0–8.0)

## 2014-05-30 LAB — GLUCOSE, CAPILLARY: GLUCOSE-CAPILLARY: 99 mg/dL (ref 70–99)

## 2014-05-30 LAB — PROTIME-INR
INR: 1.07 (ref 0.00–1.49)
PROTHROMBIN TIME: 13.9 s (ref 11.6–15.2)

## 2014-05-30 LAB — URINE MICROSCOPIC-ADD ON

## 2014-05-30 LAB — HEMOGLOBIN A1C
Hgb A1c MFr Bld: 5.5 % (ref <5.7)
Mean Plasma Glucose: 111 mg/dL (ref <117)

## 2014-05-30 LAB — MAGNESIUM: Magnesium: 1.9 mg/dL (ref 1.5–2.5)

## 2014-05-30 MED ORDER — FERROUS SULFATE 325 (65 FE) MG PO TABS
325.0000 mg | ORAL_TABLET | Freq: Every day | ORAL | Status: DC
  Administered 2014-05-30 – 2014-06-02 (×4): 325 mg via ORAL
  Filled 2014-05-30 (×6): qty 1

## 2014-05-30 MED ORDER — CLOPIDOGREL BISULFATE 75 MG PO TABS
75.0000 mg | ORAL_TABLET | Freq: Every day | ORAL | Status: DC
  Administered 2014-05-30 – 2014-06-02 (×4): 75 mg via ORAL
  Filled 2014-05-30 (×4): qty 1

## 2014-05-30 MED ORDER — POTASSIUM CHLORIDE 10 MEQ/100ML IV SOLN
10.0000 meq | Freq: Once | INTRAVENOUS | Status: AC
  Administered 2014-05-30: 10 meq via INTRAVENOUS
  Filled 2014-05-30: qty 100

## 2014-05-30 MED ORDER — SODIUM CHLORIDE 0.9 % IV SOLN
INTRAVENOUS | Status: AC
  Administered 2014-05-30: 03:00:00 via INTRAVENOUS

## 2014-05-30 MED ORDER — OXYCODONE-ACETAMINOPHEN 5-325 MG PO TABS
1.0000 | ORAL_TABLET | ORAL | Status: DC | PRN
  Administered 2014-05-30: 1 via ORAL
  Filled 2014-05-30: qty 1

## 2014-05-30 MED ORDER — ZOLPIDEM TARTRATE 5 MG PO TABS: 5.0000 mg | ORAL_TABLET | Freq: Every evening | ORAL | Status: DC | PRN

## 2014-05-30 MED ORDER — LUBIPROSTONE 24 MCG PO CAPS
24.0000 ug | ORAL_CAPSULE | Freq: Every evening | ORAL | Status: DC | PRN
  Filled 2014-05-30: qty 1

## 2014-05-30 MED ORDER — ASPIRIN EC 81 MG PO TBEC
81.0000 mg | DELAYED_RELEASE_TABLET | Freq: Every day | ORAL | Status: DC
  Administered 2014-05-30: 81 mg via ORAL
  Filled 2014-05-30 (×3): qty 1

## 2014-05-30 MED ORDER — CARVEDILOL 3.125 MG PO TABS
3.1250 mg | ORAL_TABLET | Freq: Two times a day (BID) | ORAL | Status: DC
  Filled 2014-05-30 (×4): qty 1

## 2014-05-30 MED ORDER — POTASSIUM CHLORIDE 10 MEQ/100ML IV SOLN
10.0000 meq | INTRAVENOUS | Status: AC
  Administered 2014-05-30 (×4): 10 meq via INTRAVENOUS
  Filled 2014-05-30 (×4): qty 100

## 2014-05-30 MED ORDER — ADULT MULTIVITAMIN W/MINERALS CH
1.0000 | ORAL_TABLET | Freq: Every day | ORAL | Status: DC
  Administered 2014-05-30 – 2014-06-02 (×4): 1 via ORAL
  Filled 2014-05-30 (×4): qty 1

## 2014-05-30 MED ORDER — ISOSORBIDE MONONITRATE ER 60 MG PO TB24
60.0000 mg | ORAL_TABLET | Freq: Every evening | ORAL | Status: DC
Start: 2014-05-30 — End: 2014-05-30
  Filled 2014-05-30: qty 1

## 2014-05-30 MED ORDER — ASPIRIN 81 MG PO CHEW: 324.0000 mg | CHEWABLE_TABLET | Freq: Once | ORAL | Status: DC

## 2014-05-30 MED ORDER — DIPHENOXYLATE-ATROPINE 2.5-0.025 MG PO TABS: 1.0000 | ORAL_TABLET | Freq: Four times a day (QID) | ORAL | Status: DC | PRN

## 2014-05-30 MED ORDER — ALBUTEROL SULFATE (2.5 MG/3ML) 0.083% IN NEBU: 2.5000 mg | INHALATION_SOLUTION | Freq: Four times a day (QID) | RESPIRATORY_TRACT | Status: DC | PRN

## 2014-05-30 MED ORDER — TRAZODONE HCL 50 MG PO TABS
50.0000 mg | ORAL_TABLET | Freq: Every day | ORAL | Status: DC
  Administered 2014-05-30 – 2014-06-01 (×3): 50 mg via ORAL
  Filled 2014-05-30 (×4): qty 1

## 2014-05-30 MED ORDER — ROSUVASTATIN CALCIUM 40 MG PO TABS
40.0000 mg | ORAL_TABLET | Freq: Every day | ORAL | Status: DC
  Administered 2014-05-30 – 2014-06-01 (×3): 40 mg via ORAL
  Filled 2014-05-30 (×6): qty 1

## 2014-05-30 MED ORDER — STROKE: EARLY STAGES OF RECOVERY BOOK
Freq: Once | Status: AC
  Administered 2014-05-30: 1
  Filled 2014-05-30: qty 1

## 2014-05-30 MED ORDER — MORPHINE SULFATE 4 MG/ML IJ SOLN
4.0000 mg | Freq: Once | INTRAMUSCULAR | Status: AC
  Administered 2014-05-30: 4 mg via INTRAVENOUS
  Filled 2014-05-30: qty 1

## 2014-05-30 MED ORDER — MORPHINE SULFATE ER 15 MG PO TBCR
30.0000 mg | EXTENDED_RELEASE_TABLET | Freq: Three times a day (TID) | ORAL | Status: DC
Start: 2014-05-30 — End: 2014-06-02
  Administered 2014-05-30 – 2014-06-02 (×10): 30 mg via ORAL
  Filled 2014-05-30 (×6): qty 2
  Filled 2014-05-30: qty 1
  Filled 2014-05-30 (×3): qty 2

## 2014-05-30 MED ORDER — OXYCODONE-ACETAMINOPHEN 10-325 MG PO TABS: 1.0000 | ORAL_TABLET | ORAL | Status: DC | PRN

## 2014-05-30 MED ORDER — NITROGLYCERIN 0.4 MG SL SUBL: 0.4000 mg | SUBLINGUAL_TABLET | SUBLINGUAL | Status: DC | PRN

## 2014-05-30 MED ORDER — ALBUTEROL SULFATE HFA 108 (90 BASE) MCG/ACT IN AERS
2.0000 | INHALATION_SPRAY | Freq: Four times a day (QID) | RESPIRATORY_TRACT | Status: DC | PRN
  Administered 2014-05-30: 2 via RESPIRATORY_TRACT
  Filled 2014-05-30: qty 6.7

## 2014-05-30 MED ORDER — BECLOMETHASONE DIPROPIONATE 80 MCG/ACT IN AERS
2.0000 | INHALATION_SPRAY | Freq: Two times a day (BID) | RESPIRATORY_TRACT | Status: DC
  Administered 2014-05-30 – 2014-05-31 (×2): 2 via RESPIRATORY_TRACT
  Filled 2014-05-30 (×2): qty 8.7

## 2014-05-30 MED ORDER — OXYCODONE HCL 5 MG PO TABS
5.0000 mg | ORAL_TABLET | ORAL | Status: DC | PRN
  Administered 2014-05-30 – 2014-06-01 (×2): 5 mg via ORAL
  Filled 2014-05-30 (×2): qty 1

## 2014-05-30 MED ORDER — HEPARIN SODIUM (PORCINE) 5000 UNIT/ML IJ SOLN
5000.0000 [IU] | Freq: Three times a day (TID) | INTRAMUSCULAR | Status: DC
  Administered 2014-05-30: 5000 [IU] via SUBCUTANEOUS
  Filled 2014-05-30: qty 1

## 2014-05-30 MED ORDER — SENNOSIDES-DOCUSATE SODIUM 8.6-50 MG PO TABS
1.0000 | ORAL_TABLET | Freq: Every evening | ORAL | Status: DC | PRN
  Filled 2014-05-30: qty 1

## 2014-05-30 MED ORDER — ELVITEG-COBIC-EMTRICIT-TENOFDF 150-150-200-300 MG PO TABS
1.0000 | ORAL_TABLET | Freq: Every day | ORAL | Status: DC
  Administered 2014-05-30 – 2014-06-02 (×4): 1 via ORAL
  Filled 2014-05-30 (×8): qty 1

## 2014-05-30 NOTE — Progress Notes (Signed)
VASCULAR LAB PRELIMINARY  PRELIMINARY  PRELIMINARY  PRELIMINARY  Carotid duplex  completed.    Preliminary report:  Bilateral:  1-39% ICA stenosis.  Unable to insonate the left ECA.  Vertebral artery flow is antegrade.      Daysie Helf, RVT 05/30/2014, 3:10 PM

## 2014-05-30 NOTE — ED Notes (Addendum)
[name] from MRI called to explain there is a delay in taking her to MRI due to it possibly erasing information on her loop recorder. Pt reports she sees Dr. Marland KitchenKimmer" (pt is not sure if this is the correct spelling) for her loop recorder. This RN will figure out how to contact Dr. Marland KitchenKimmer" to see if he is okay with patient having an MRI performed.

## 2014-05-30 NOTE — ED Notes (Signed)
Notified Dr. Alice Rieger that MRI cannot be done until Dr. Rayann Heman is notified that Loop Recorder information can be lost if she has MRI. Dr. Alice Rieger will contact Dr. Rayann Heman

## 2014-05-30 NOTE — Progress Notes (Signed)
  Echocardiogram 2D Echocardiogram has been performed.  [name] 05/30/2014, 4:41 PM
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
## 2014-05-30 NOTE — ED Notes (Signed)
Gave pt [place] sandwich and crackers. Notified MRI that pt is ready for transport

## 2014-05-30 NOTE — ED Notes (Addendum)
NP Burnetta Sabin called and informed that Loop recorder is MRI compatible and she has notified MRI and is awaiting a call back from MRI supervisor.

## 2014-05-30 NOTE — Progress Notes (Signed)
Utilization review completed. Tylor Gambrill, RN, BSN. 
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
## 2014-05-30 NOTE — Progress Notes (Signed)
STROKE TEAM PROGRESS NOTE   HISTORY [name] is an 53 y.o. female, right handed, with a past medical history that is relevant for hypertension, hyperlipidemia, ischemic stroke ion 2013 without residual deficits, COPD, HIV (last CD4 420 and <20 VL on 01/16/14), CAD, chronic congestive heart failure, iron-deficiency anemia, chronic right ankle pain, anxiety/depression, OSA, chronic right ankle pain, comes in with complains of difficulty expressing herself. She said that she has not residual language or speech trouble from her previous stroke, but since this past Thursday 05/23/2014 or Friday 05/24/2014 her daughter had noticed that she " makes not sense when taking and can not get the right words correctly'. Patient stated that she is confused about her daughters names, can not speak properly, gets confused understanding the content of text messages sent to her, can not speak fluently because can not get the right words out all the time, and her family tells her that she makes not sense when talking back to them. Complains of episodic HA for the last couple of days but denies vertigo, double vision, difficulty swallowing, focal weakness or numbness, slurred speech, imbalance, or visual disturbances. Stated that her dose of MS contin was increased yesterday but is not sure about the exact dose she is taking at this time.  No recent fever, infection, or head trauma. Serologies unimpressive. CT brain suggest a new small infarct in the left frontal lobe (MCA territory). She is on plavix. Last known well uncertain. Patient was not administered TPA secondary to delay in arrival. She was admitted for further evaluation and treatment.   SUBJECTIVE (INTERVAL HISTORY)  No family is at the bedside.  Overall she feels her condition is unchanged since last week - she is still having difficulty expressing herself and she is not sure why. She is a poor historian and cannot share much info.    OBJECTIVE Temp:  [98 F  (36.7 C)-99.3 F (37.4 C)] 98.1 F (36.7 C) (09/24 1241) Pulse Rate:  [68-89] 80 (09/24 1241) Cardiac Rhythm:  [-] Normal sinus rhythm (09/23 2247) Resp:  [15-22] 18 (09/24 1241) BP: (100-135)/(42-93) 135/79 mmHg (09/24 1241) SpO2:  [91 %-100 %] 99 % (09/24 1241) Weight:  [104.327 kg (230 lb)] 104.327 kg (230 lb) (09/23 1921)  No results found for this basename: GLUCAP,  in the last 168 hours  Recent Labs Lab 05/29/14 1929  NA 139  K 2.9*  CL 103  CO2 24  GLUCOSE 125*  BUN 9  CREATININE 0.77  CALCIUM 9.0  MG 1.9    Recent Labs Lab 05/29/14 1929  AST 19  ALT 9  ALKPHOS 59  BILITOT 0.6  PROT 8.1  ALBUMIN 3.5    Recent Labs Lab 05/29/14 1929  WBC 4.0  NEUTROABS 2.0  HGB 12.6  HCT 39.9  MCV 78.1  PLT 215    Recent Labs Lab 05/30/14 0415 05/30/14 1018  TROPONINI <0.30 <0.30    Recent Labs  05/30/14 0117  LABPROT 13.9  INR 1.07    Recent Labs  05/30/14 0103  COLORURINE YELLOW  LABSPEC 1.018  PHURINE 6.0  GLUCOSEU NEGATIVE  HGBUR LARGE*  BILIRUBINUR NEGATIVE  KETONESUR NEGATIVE  PROTEINUR NEGATIVE  UROBILINOGEN 1.0  NITRITE NEGATIVE  LEUKOCYTESUR NEGATIVE       Component Value Date/Time   CHOL 186 05/30/2014 0415   TRIG 86 05/30/2014 0415   HDL 43 05/30/2014 0415   CHOLHDL 4.3 05/30/2014 0415   VLDL 17 05/30/2014 0415   LDLCALC 126* 05/30/2014 0415  Lab Results  Component Value Date   HGBA1C 5.5 05/30/2014      Component Value Date/Time   LABOPIA PPS 05/16/2014 1215   LABOPIA NONE DETECTED 10/02/2013 1845   COCAINSCRNUR PPS 05/16/2014 1215   COCAINSCRNUR NONE DETECTED 10/02/2013 1845   LABBENZ NEG 05/16/2014 1215   LABBENZ NONE DETECTED 10/02/2013 1845   LABBENZ NEGATIVE 05/23/2009 0542   AMPHETMU NONE DETECTED 10/02/2013 1845   AMPHETMU NEGATIVE 05/23/2009 0542   THCU NONE DETECTED 10/02/2013 1845   LABBARB NEG 05/16/2014 1215   LABBARB NONE DETECTED 10/02/2013 1845    No results found for this basename: ETH,  in the last 168  hours  Dg Chest 2 View  05/30/2014   CLINICAL DATA:  Stroke.  EXAM: CHEST  2 VIEW  COMPARISON:  10/02/2013  FINDINGS: There is mild cardiomegaly. Negative upper mediastinal contours. Implantable loop recorder within the left chest wall. There is no edema, consolidation, effusion, or pneumothorax.  IMPRESSION: Cardiomegaly without edema.   Electronically Signed   By: [name] M.D.   On: 05/30/2014 04:39   Ct Head Wo Contrast  05/30/2014   CLINICAL DATA:  Intermittent confusion and disorientation.  EXAM: CT HEAD WITHOUT CONTRAST  TECHNIQUE: Contiguous axial images were obtained from the base of the skull through the vertex without intravenous contrast.  COMPARISON:  10/02/2013.  FINDINGS: Skull and Sinuses:Negative for fracture or destructive process. The mastoids, middle ears, and imaged paranasal sinuses are clear.  Orbits: Status post right scleral banding and cataract resection. No acute orbital findings.  Brain: There is new was small area of cortical and subcortical low-density in the left frontal lobe anteriorly. New deep white matter low-attenuation in the same region.  Remote right superior cerebellar infarct and right temporal lobe infarct along the sylvian fissure. Remote small vessel ischemic change in the right centrum semiovale.  No evidence of hydrocephalus, mass lesion, or shift.  IMPRESSION: 1. New small infarct in the left frontal lobe (MCA territory). MRI could confirm and determine acuity. 2. Remote right cerebellar and right cerebral infarcts.   Electronically Signed   By: [name] M.D.   On: 05/30/2014 01:08   Mr [name] Head Wo Contrast  05/30/2014   CLINICAL DATA:  Difficulty with slurred speech beginning several days ago. Hypertension. HIV. History of prior strokes. Confusion.  EXAM: MRI HEAD WITHOUT CONTRAST  MRA HEAD WITHOUT CONTRAST  TECHNIQUE: Multiplanar, multiecho pulse sequences of the brain and surrounding structures were obtained without intravenous contrast.  Angiographic images of the head were obtained using MRA technique without contrast.  COMPARISON:  None.  CT head earlier today.  MR head 10/02/2013.  FINDINGS: MRI HEAD FINDINGS  There is a moderately large area of restricted diffusion consistent with acute infarction affecting the LEFT frontal cortex extending to the operculum, as well as extending into the deep white matter. Some central areas of normalized diffusion could reflect an early subacute time course. There is minor involvement of the LEFT insula/ LEFT external capsule. There is no midline shift. No acute or subacute hemorrhage is evident.  Areas of chronic cerebral and cerebellar infarction as outlined on previous MR from January. These involve most notably the RIGHT superior cerebellum and RIGHT posterior frontal subcortical white matter. Mild subcortical and periventricular T2 and FLAIR hyperintensities, likely chronic microvascular ischemic change. No foci of chronic hemorrhage. Flow voids are maintained. No midline abnormality. Cervical spondylosis, incompletely evaluated. Visualized paranasal sinuses, and mastoids appear unremarkable. Previous RIGHT ocular surgery. No osseous findings.  MRA  HEAD FINDINGS  Motion degraded exam.  The internal carotid arteries are widely patent. The basilar artery is widely patent with vertebrals codominant. There is no proximal intracranial stenosis or aneurysm.  There is no evidence for a proximal LEFT MCA lesion. Normal LEFT MCA bifurcation. There appears to be premature truncation of a LEFT M3 branch subserving the area of the LEFT frontal lobe, which could be embolic or thrombotic in nature.  IMPRESSION: Moderately large area of acute to early subacute infarction without hemorrhage, affecting the LEFT frontal cortex, subcortical white matter, with minor involvement of LEFT insula. No midline shift.  No proximal intracranial stenosis on MRA. It is possible that a LEFT MCA M3 branch subserving the area of  infarction is prematurely truncated.   Electronically Signed   By: Rolla Flatten M.D.   On: 05/30/2014 12:20   Mr Brain Wo Contrast  05/30/2014   CLINICAL DATA:  Difficulty with slurred speech beginning several days ago. Hypertension. HIV. History of prior strokes. Confusion.  EXAM: MRI HEAD WITHOUT CONTRAST  MRA HEAD WITHOUT CONTRAST  TECHNIQUE: Multiplanar, multiecho pulse sequences of the brain and surrounding structures were obtained without intravenous contrast. Angiographic images of the head were obtained using MRA technique without contrast.  COMPARISON:  None.  CT head earlier today.  MR head 10/02/2013.  FINDINGS: MRI HEAD FINDINGS  There is a moderately large area of restricted diffusion consistent with acute infarction affecting the LEFT frontal cortex extending to the operculum, as well as extending into the deep white matter. Some central areas of normalized diffusion could reflect an early subacute time course. There is minor involvement of the LEFT insula/ LEFT external capsule. There is no midline shift. No acute or subacute hemorrhage is evident.  Areas of chronic cerebral and cerebellar infarction as outlined on previous MR from January. These involve most notably the RIGHT superior cerebellum and RIGHT posterior frontal subcortical white matter. Mild subcortical and periventricular T2 and FLAIR hyperintensities, likely chronic microvascular ischemic change. No foci of chronic hemorrhage. Flow voids are maintained. No midline abnormality. Cervical spondylosis, incompletely evaluated. Visualized paranasal sinuses, and mastoids appear unremarkable. Previous RIGHT ocular surgery. No osseous findings.  MRA HEAD FINDINGS  Motion degraded exam.  The internal carotid arteries are widely patent. The basilar artery is widely patent with vertebrals codominant. There is no proximal intracranial stenosis or aneurysm.  There is no evidence for a proximal LEFT MCA lesion. Normal LEFT MCA bifurcation. There  appears to be premature truncation of a LEFT M3 branch subserving the area of the LEFT frontal lobe, which could be embolic or thrombotic in nature.  IMPRESSION: Moderately large area of acute to early subacute infarction without hemorrhage, affecting the LEFT frontal cortex, subcortical white matter, with minor involvement of LEFT insula. No midline shift.  No proximal intracranial stenosis on MRA. It is possible that a LEFT MCA M3 branch subserving the area of infarction is prematurely truncated.   Electronically Signed   By: Rolla Flatten M.D.   On: 05/30/2014 12:20    PHYSICAL EXAM Obese middle-aged [place] American lady currently not in distress.Awake alert. Afebrile. Head is nontraumatic. Neck is supple without bruit. Hearing is normal. Cardiac exam no murmur or gallop. Lungs are clear to auscultation. Distal pulses are well felt. Neurological Exam :  : And at x3. Mild expressive aphasia with occasional difficulties. No paraphasias. Decreased naming and repetition. Comprehension appears good. Mild right lower facial asymmetry. Midline. Palatal movements are normal. Motor system exam revealed no upper pole  eczema to drift. Symmetric and equal strength in all 4 extremities. Sensation appears preserved bilaterally. Coordination appears normal. Gait was not tested.  ASSESSMENT/PLAN  [name] is a 53 y.o. female with history of hypertension, hyperlipidemia, AIS in 2013 without residual deficits, COPD, HIV, CAD, CHF, anemia, OSA presenting with difficulty expressing herself for several days. She did not receive IV t-PA due to delay in arrival. MRI imaging confirms a left moderate size frontal cortex, subcortical white matter and left insular infarct.   Stroke:  left moderate size frontal cortex, subcortical white matter and left insular, embolic secondary to unknown cause -  Cryptogenic strokes  clopidogrel 75 mg orally every day prior to admission, now on aspirin 81 mg orally every day and  clopidogrel 75 mg orally every day. Based on current study findings, do not recommend the use of dual antiplatelets given increased risk for intracerebral hemorrhage.  MRI  left moderate size frontal cortex, subcortical white matter and left insular infarct  MRA  ? L MCA M3 branch truncated, o/w no proximal IC stenosis  Carotid Doppler  pending   2D Echo  pending   SCDs for VTE prophylaxis  Loop recorder placed for cryptogenic stroke 09/2013. Loop interrogated in ED. Per Dr. Rayann Heman, PACS and aberrance but no atrial fibrillation detected.   F/U UDS results.    heart healthy/carb modified clear liquids.   Bedrest  Resultant expressive aphasia  Therapy recommendations:  pending   Ongoing aggressive risk factor management  Disposition:  Anticipate discharge home  Hypertension   Permissive hypertension <220/120 for 24-48 hours and then gradually normalize within 5-7 days  BP goal long term normotensive  Home meds:  Coreg and Imdur resumed in hospital. Was also on lisinopril, not resumed due to stroke BP 100-135/69-79 past 24h (05/30/2014 @ 3:16 PM) Stable  Hyperlipidemia  Home meds:  crestor 40. Not resumed in hospital  Allergic to atorvastatin (affects her memory)  LDL 126   At LDL goal < 70 for diabetics  Resume statin at discharge  Diabetes  HgbA1c 5.5   Controlled  At Goal < 7.0  Other Stroke Risk Factors Former cigarette smoker ETOH use   Morbid obesity, Body mass index is 42.06 kg/(m^2).    Hx stroke - R insular ischemic CVA ; in Januray  3212 and prior embolic stroke in April 2482   Family hx stroke (maternal aunt)   Coronary artery disease Obstructive sleep apnea, on CPAP at home HIV CHF. EF 25% 09/2013.euvolemic.  Other Active Problems  COPD  Hospital day # 1  SHARON BIBY, MSN, RN, ANVP-BC, ANP-BC, [facility] Pager: [phone] 05/30/2014 3:39 PM  I have personally examined this patient, reviewed notes, independently  viewed imaging studies, participated in medical decision making and plan of care. I have made any additions or clarifications directly to the above note. Agree with note above.  Patient has recently had an extensive evaluation for cryptogenic stroke including loop recorder placement.Marland Kitchen Hence will not do extensive testing again. Consider participation in the [place], MD Medical Director [facility] Pager: [phone] 05/30/2014 3:41 PM    To contact Stroke Continuity provider, please refer to [email or web address] After hours, contact General Neurology

## 2014-05-30 NOTE — ED Notes (Signed)
Pt's loop recorded interrogated by Tomi Bamberger for Dr. Rayann Heman. Per Tomi Bamberger, pt is ready for MRI.

## 2014-05-30 NOTE — ED Notes (Signed)
MRI called and asked to be informed when patient is ready after Loop recorder information has been downloaded.

## 2014-05-30 NOTE — Progress Notes (Signed)
BEAUTIFUL PENSYL RSW:[id number] DOB: 11-07-1960 DOA: 05/29/2014 PCP: Larey Dresser, MD Assessment/ Plan:   53 y.o. with past medical history of hypertension, hyperlipidemia, ischemic stroke in 2013 without residual deficits, COPD, HIV (last CD4 420 and <20 VL on 01/16/14), CAD, chronic congestive heart failure, iron-deficiency anemia, anxiety/depression, OSA, who presents with difficult expressing herself and left arm weakness for several days. Head CT scan revealed a new small infarct in the left frontal lobe (MCA territory,) in addition to remote right cerebral and right cerebellar infarcts. Had been admitted to [facility] and they contacted IMTS for transfer.    Acute Ischemic stroke: Symptoms currently, improving per patient. Only finding on exam is slurred speech , with power of 4/5 on the left upper extremity. Otherwise, normal. Neurologic exam. Patient denies receptive aphasic at this point. Head CT scan revealed new left frontal lobe infarct. Awaiting MRI , which was initially delayed due to presence of a loop recorder. Discussed with Dr. Rayann Heman, who recommended the patient can have MRI as this is an MRI compatible device. History of cryptogenic embolic strokes, with a loop recorder placement in 09/2013. Patient has been evaluated by neurology was consulted, Dr. Armida Sans and suggested starting aspirin and do further workup.  Plan - Will admit to telemetry, follow up Neuro recs  - Obtain MRI/MRI - loop recorder in MRI compatible - allow permissive HTN in the setting of acute stroke, hold lisinopril and coreg - Check carotid dopplers - continue home Plavix  - start ASA  - Obtain fasting lipid panel and HbA1c  - Cycle cardiac enzymes and 12 lead EKG  - 2D transthoracic echocardiography  - PT/OT consult  - NPO and SLP - will get bedside RN swallow eval and order diet afterwards if she does well  HIV: well controlled. Recent CD4 420, viral load less than 20 on 01/16/14  - continue home meds    CHF: 2-D echo on 09/14/13 showed EF 09% with diastolic dysfunction-grade 1. She is clinically euvolemic today.  -Will hold lisinopril in the setting of acute stroke  -Will hold beta blocker in the setting of acute stroke. This can be restarted after 24-48 hours. -Initially, blood pressure was low in the 90s over 50s. Rehydrated with IV fluids at 50 cc per hour for 12 hours. -Will not with IV fluids  DVT ppx: Dc SQ Heparin in the setting of acute ischemic stroke. SCDs  F/E/N: Any by mouth until swallow evaluation has been passed. Code Status: Full code  Family Communication: None at bed side.  Disposition Plan: Discussed with the patient about the plan of management. She verbalized understanding. Will be receiving an MRI of the head. Neurology will follow. Will continue with other stroke workup.   The patient does have current PCP Larey Dresser, MD), therefore is require OPC follow-up after discharge.   The patient does not have transportation limitations that hinder transportation to clinic appointments.  .Services Needed at time of discharge: Y = Yes, Blank = No PT:   OT:   RN:   Equipment:   Other:    Length of Stay: 1 days   Subjective/Interval Events:    Subjective:  Currently, feels better. Slurred speech, is somehow improved, but still present. Left upper extremity weakness also improved.  Interval Events: Head CT scan: New left frontal lobe infarct.  Head MRI: Pending.   Objective:    Weights: 24-hour Weight change:   Filed Weights   05/29/14 1921  Weight: 230 lb (104.327 kg)  Intake/Output:  No intake or output data in the 24 hours ending 05/30/14 1000     Physical Exam: Vital Signs:   Temp:  [98 F (36.7 C)-99.3 F (37.4 C)] 98 F (36.7 C) (09/24 0459) Pulse Rate:  [74-89] 89 (09/24 0930) Resp:  [15-22] 17 (09/24 0930) BP: (100-123)/(42-93) 110/85 mmHg (09/24 0930) SpO2:  [91 %-100 %] 97 % (09/24 0930) Weight:  [230 lb (104.327 kg)] 230  lb (104.327 kg) (09/23 1921) General: Vital signs reviewed. No distress.  Lungs: Clear to auscultation bilaterally  Heart: RRR; no extra sounds or murmurs  Abdomen: Bowel sounds present, soft, nontender; no hepatosplenomegaly  Extremities: No bilateral ankle edema  Neurologic: Alert and oriented x3. Moves all extremities. Speech somehow, slurred. Weakness of the left upper extremity with power of 4/5. Gait not tested.   Labs: Basic Metabolic Panel:  Recent Labs Lab 05/29/14 1929  NA 139  K 2.9*  CL 103  CO2 24  GLUCOSE 125*  BUN 9  CREATININE 0.77  CALCIUM 9.0  MG 1.9    Liver Function Tests:  Recent Labs Lab 05/29/14 1929  AST 19  ALT 9  ALKPHOS 59  BILITOT 0.6  PROT 8.1  ALBUMIN 3.5     CBC:  Recent Labs Lab 05/29/14 1929  WBC 4.0  NEUTROABS 2.0  HGB 12.6  HCT 39.9  MCV 78.1  PLT 215    Cardiac Enzymes:  Recent Labs Lab 05/30/14 0415  TROPONINI <0.30     CBG: No results found for this basename: GLUCAP,  in the last 168 hours  Coagulation Studies:  Recent Labs  05/30/14 0117  LABPROT 13.9  INR 1.07    Microbiology: Results for orders placed during the hospital encounter of 03/28/14  AFB CULTURE WITH SMEAR     Status: None   Collection Time    03/28/14  4:10 PM      Result Value Ref Range Status   Specimen Description TISSUE TIBIA RIGHT   Final   Special Requests NONE   Final   Acid Fast Smear     Final   Value: NO ACID FAST BACILLI SEEN     Performed at [facility]   Culture     Final   Value: NO ACID FAST BACILLI ISOLATED IN 6 WEEKS     Performed at [facility]   Report Status 05/10/2014 FINAL   Final  ANAEROBIC CULTURE     Status: None   Collection Time    03/28/14  4:10 PM      Result Value Ref Range Status   Specimen Description TISSUE TIBIA RIGHT   Final   Special Requests NONE   Final   Gram Stain     Final   Value: NO WBC SEEN     NO ORGANISMS SEEN     Performed at [facility]    Culture     Final   Value: NO ANAEROBES ISOLATED     Performed at [facility]   Report Status 04/02/2014 FINAL   Final  FUNGUS CULTURE W SMEAR     Status: None   Collection Time    03/28/14  4:10 PM      Result Value Ref Range Status   Specimen Description TISSUE TIBIA RIGHT   Final   Special Requests NONE   Final   Fungal Smear     Final   Value: NO YEAST OR FUNGAL ELEMENTS SEEN     Performed at [facility]   Culture     Final   Value: No Fungi Isolated in 4 Weeks     Performed at [facility]   Report Status 04/26/2014 FINAL   Final  TISSUE CULTURE     Status: None   Collection Time    03/28/14  4:10 PM      Result Value Ref Range Status   Specimen Description TISSUE TIBIA RIGHT   Final   Special Requests NONE   Final   Gram Stain     Final   Value: NO WBC SEEN     NO ORGANISMS SEEN     Performed at [facility]   Culture     Final   Value: NO GROWTH 3 DAYS     Performed at [facility]   Report Status 04/01/2014 FINAL   Final  AFB CULTURE WITH SMEAR     Status: None   Collection Time    03/28/14  4:16 PM      Result Value Ref Range Status   Specimen Description TISSUE TIBIA RIGHT   Final   Special Requests TALLEOUS CULTURE NO 3   Final   Acid Fast Smear     Final   Value: NO ACID FAST BACILLI SEEN     Performed at [facility]   Culture     Final   Value: NO ACID FAST BACILLI ISOLATED IN 6 WEEKS     Performed at [facility]   Report Status 05/10/2014 FINAL   Final  ANAEROBIC CULTURE     Status: None   Collection Time    03/28/14  4:16 PM      Result Value Ref Range Status   Specimen Description TISSUE TIBIA RIGHT   Final   Special Requests TALLEOUS CULTURE NO 3   Final   Gram Stain     Final   Value: NO WBC SEEN     NO ORGANISMS SEEN     Performed at [facility]   Culture     Final   Value: NO ANAEROBES ISOLATED     Performed at [facility]   Report Status 04/02/2014 FINAL   Final   FUNGUS CULTURE W SMEAR     Status: None   Collection Time    03/28/14  4:16 PM      Result Value Ref Range Status   Specimen Description TISSUE TIBIA RIGHT   Final   Special Requests TALLEOUS CULTURE NO 3   Final   Fungal Smear     Final   Value: NO YEAST OR FUNGAL ELEMENTS SEEN     Performed at [facility]   Culture     Final   Value: No Fungi Isolated in 4 Weeks     Performed at [facility]   Report Status 04/26/2014 FINAL   Final  TISSUE CULTURE     Status: None   Collection Time    03/28/14  4:16 PM      Result Value Ref Range Status   Specimen Description TISSUE TIBIA RIGHT   Final   Special Requests TALLEOUS CULTURE NO 3   Final   Gram Stain     Final   Value: NO WBC SEEN     NO ORGANISMS SEEN     Performed at [facility]   Culture     Final   Value: NO GROWTH 3 DAYS     Performed at [facility]   Report Status 04/01/2014 FINAL   Final  AFB CULTURE WITH SMEAR     Status: None   Collection Time    03/28/14  4:52 PM      Result Value Ref Range Status   Specimen Description ABSCESS ANKLE RIGHT   Final   Special Requests NONE   Final   Acid Fast Smear     Final   Value: NO ACID FAST BACILLI SEEN     Performed at [facility]   Culture     Final   Value: NO ACID FAST BACILLI ISOLATED IN 6 WEEKS     Performed at [facility]   Report Status 05/10/2014 FINAL   Final  CULTURE, ROUTINE-ABSCESS     Status: None   Collection Time    03/28/14  4:52 PM      Result Value Ref Range Status   Specimen Description ABSCESS ANKLE RIGHT   Final   Special Requests NONE   Final   Gram Stain     Final   Value: RARE WBC PRESENT,BOTH PMN AND MONONUCLEAR     NO SQUAMOUS EPITHELIAL CELLS SEEN     NO ORGANISMS SEEN     Performed at [facility]   Culture     Final   Value: NO GROWTH 3 DAYS     Performed at [facility]   Report Status 04/01/2014 FINAL   Final  ANAEROBIC CULTURE     Status: None   Collection Time     03/28/14  4:52 PM      Result Value Ref Range Status   Specimen Description ABSCESS ANKLE RIGHT   Final   Special Requests NONE   Final   Gram Stain     Final   Value: RARE WBC PRESENT,BOTH PMN AND MONONUCLEAR     NO SQUAMOUS EPITHELIAL CELLS SEEN     NO ORGANISMS SEEN     Performed at [facility]   Culture     Final   Value: NO ANAEROBES ISOLATED     Performed at [facility]   Report Status 04/02/2014 FINAL   Final  FUNGUS CULTURE W SMEAR     Status: None   Collection Time    03/28/14  4:52 PM      Result Value Ref Range Status   Specimen Description ABSCESS ANKLE RIGHT   Final   Special Requests NONE   Final   Fungal Smear     Final   Value: NO YEAST OR FUNGAL ELEMENTS SEEN     Performed at [facility]   Culture     Final   Value: No Fungi Isolated in 4 Weeks     Performed at [facility]   Report Status 04/26/2014 FINAL   Final     Imaging: Dg Chest 2 View  05/30/2014   CLINICAL DATA:  Stroke.  EXAM: CHEST  2 VIEW  COMPARISON:  10/02/2013  FINDINGS: There is mild cardiomegaly. Negative upper mediastinal contours. Implantable loop recorder within the left chest wall. There is no edema, consolidation, effusion, or pneumothorax.  IMPRESSION: Cardiomegaly without edema.   Electronically Signed   By: [name] M.D.   On: 05/30/2014 04:39   Ct Head Wo Contrast  05/30/2014   CLINICAL DATA:  Intermittent confusion and disorientation.  EXAM: CT HEAD WITHOUT CONTRAST  TECHNIQUE: Contiguous axial images were obtained from the base of the skull through the vertex without intravenous  contrast.  COMPARISON:  10/02/2013.  FINDINGS: Skull and Sinuses:Negative for fracture or destructive process. The mastoids, middle ears, and imaged paranasal sinuses are clear.  Orbits: Status post right scleral banding and cataract resection. No acute orbital findings.  Brain: There is new was small area of cortical and subcortical low-density in the left frontal lobe  anteriorly. New deep white matter low-attenuation in the same region.  Remote right superior cerebellar infarct and right temporal lobe infarct along the sylvian fissure. Remote small vessel ischemic change in the right centrum semiovale.  No evidence of hydrocephalus, mass lesion, or shift.  IMPRESSION: 1. New small infarct in the left frontal lobe (MCA territory). MRI could confirm and determine acuity. 2. Remote right cerebellar and right cerebral infarcts.   Electronically Signed   By: [name] M.D.   On: 05/30/2014 01:08      Medications:    Infusions: . sodium chloride 50 mL/hr at 05/30/14 0256     Scheduled Medications: . aspirin EC  81 mg Oral Daily  . beclomethasone  2 puff Inhalation BID  . clopidogrel  75 mg Oral Daily  . elvitegravir-cobicistat-emtricitabine-tenofovir  1 tablet Oral Q breakfast  . ferrous sulfate  325 mg Oral Q breakfast  . heparin  5,000 Units Subcutaneous 3 times per day  . morphine  30 mg Oral TID  . multivitamin with minerals  1 tablet Oral Daily  . rosuvastatin  40 mg Oral q1800  . traZODone  50 mg Oral QHS     PRN Medications: albuterol, diphenoxylate-atropine, lubiprostone, nitroGLYCERIN, oxyCODONE, oxyCODONE-acetaminophen, senna-docusate, zolpidem  Principal Problem:   Ischemic stroke Active Problems:   HUMAN IMMUNODEFICIENCY VIRUS [HIV]   HYPERLIPIDEMIA   HYPERTENSION   Chronic combined systolic and diastolic CHF (congestive heart failure)   OSA (obstructive sleep apnea)   COPD (chronic obstructive pulmonary disease)    Signed by:  Jessee Avers, MD PGY-3, Internal Medicine  Pager [phone] 05/30/2014, 10:00 AM

## 2014-05-30 NOTE — Progress Notes (Signed)
Implantable loop recorder interrogation review:  Device:  Medtronic LINQ Implant date: 09/18/13 Indication: cryptogenic stroke  Findings: She has had several episodes of sinus rhythm with PACs and aberrancy but no afib detected.  A single short run (7 seconds) of nonsustained atach with aberrancy on 05/07/14.  No arrhythmias since 05/13/14 (sinus with PACs at that time).  Conclusions: No arrhythmias to explain recurrent stroke.  Electrophysiology team to see as needed while here. Please call with questions.

## 2014-05-30 NOTE — ED Notes (Signed)
MD at bedside. 

## 2014-05-30 NOTE — ED Notes (Signed)
CT coming to get pt within 20 minutes. Pt will go to MRI and then to floor. Receiving RN aware.

## 2014-05-30 NOTE — Progress Notes (Signed)
OT Cancellation Note  Patient Details Name: BURNELL MATLIN MRN: [id number] DOB: 1961/03/03   Cancelled Treatment:     Pt off the floor at MRI. OT will follow up as available to complete evaluation.   Villa Herb M 05/30/2014, 11:24 AM  Cyndie Chime, OTR/L Occupational Therapist [phone] (pager)

## 2014-05-31 ENCOUNTER — Encounter (HOSPITAL_COMMUNITY): Payer: Self-pay | Admitting: Radiology

## 2014-05-31 ENCOUNTER — Inpatient Hospital Stay (HOSPITAL_COMMUNITY): Payer: Medicare HMO

## 2014-05-31 DIAGNOSIS — B20 Human immunodeficiency virus [HIV] disease: Secondary | ICD-10-CM

## 2014-05-31 DIAGNOSIS — J449 Chronic obstructive pulmonary disease, unspecified: Secondary | ICD-10-CM

## 2014-05-31 DIAGNOSIS — I509 Heart failure, unspecified: Secondary | ICD-10-CM

## 2014-05-31 DIAGNOSIS — I10 Essential (primary) hypertension: Secondary | ICD-10-CM

## 2014-05-31 LAB — BASIC METABOLIC PANEL
ANION GAP: 9 (ref 5–15)
BUN: 12 mg/dL (ref 6–23)
CALCIUM: 9 mg/dL (ref 8.4–10.5)
CO2: 28 mEq/L (ref 19–32)
Chloride: 102 mEq/L (ref 96–112)
Creatinine, Ser: 0.9 mg/dL (ref 0.50–1.10)
GFR, EST AFRICAN AMERICAN: 83 mL/min — AB (ref >90)
GFR, EST NON AFRICAN AMERICAN: 72 mL/min — AB (ref >90)
Glucose, Bld: 86 mg/dL (ref 70–99)
Potassium: 3.6 mEq/L — ABNORMAL LOW (ref 3.7–5.3)
SODIUM: 139 meq/L (ref 137–147)

## 2014-05-31 LAB — RAPID URINE DRUG SCREEN, HOSP PERFORMED
AMPHETAMINES: NOT DETECTED
BARBITURATES: NOT DETECTED
Benzodiazepines: NOT DETECTED
Cocaine: POSITIVE — AB
Opiates: POSITIVE — AB
Tetrahydrocannabinol: NOT DETECTED

## 2014-05-31 MED ORDER — ONDANSETRON HCL 4 MG/2ML IJ SOLN
4.0000 mg | Freq: Four times a day (QID) | INTRAMUSCULAR | Status: DC | PRN
  Administered 2014-05-31: 4 mg via INTRAVENOUS
  Filled 2014-05-31: qty 2

## 2014-05-31 MED ORDER — POTASSIUM CHLORIDE CRYS ER 20 MEQ PO TBCR
30.0000 meq | EXTENDED_RELEASE_TABLET | Freq: Once | ORAL | Status: AC
  Administered 2014-05-31: 30 meq via ORAL
  Filled 2014-05-31 (×2): qty 1

## 2014-05-31 MED ORDER — MOMETASONE FURO-FORMOTEROL FUM 100-5 MCG/ACT IN AERO
2.0000 | INHALATION_SPRAY | Freq: Two times a day (BID) | RESPIRATORY_TRACT | Status: DC
  Administered 2014-05-31 – 2014-06-02 (×4): 2 via RESPIRATORY_TRACT
  Filled 2014-05-31: qty 8.8

## 2014-05-31 MED ORDER — IOHEXOL 300 MG/ML  SOLN
25.0000 mL | INTRAMUSCULAR | Status: AC
  Administered 2014-05-31 (×2): 25 mL via ORAL

## 2014-05-31 MED ORDER — IOHEXOL 300 MG/ML  SOLN
100.0000 mL | Freq: Once | INTRAMUSCULAR | Status: AC | PRN
  Administered 2014-05-31: 100 mL via INTRAVENOUS

## 2014-05-31 NOTE — Evaluation (Signed)
Occupational Therapy Evaluation Patient Details Name: [name] MRN: [id number] DOB: 1961/06/16 Today's Date: 05/31/2014    History of Present Illness [name] is an 53 y.o. female, right handed, with a past medical history that is relevant for hypertension, hyperlipidemia, ischemic stroke ion 2013 without residual deficits, COPD, HIV (last CD4 420 and <20 VL on 01/16/14), CAD, chronic congestive heart failure, iron-deficiency anemia, chronic right ankle pain, anxiety/depression, OSA, chronic right ankle pain, comes in with complains of difficulty expressing herselCT brain suggests a new small infarct in the left frontal lobe.   Clinical Impression   Pt presenting with cognitive deficits during session and unable to perform as reliable historian regarding PLOF.  PT spoke with daughter on phone to gather home information. Per pt's daughter, pt non ambulatory over past year and requires assist for ADLs.   Pt with cam boot on RLE for pain.  Pt will benefit from acute OT services to address below problem list. Recommending SNF for d//c planning.  Follow Up Recommendations  SNF;Supervision/Assistance - 24 hour    Equipment Recommendations   (TBD)    Recommendations for Other Services       Precautions / Restrictions Precautions Precautions: Fall Precaution Comments: pt states she wears a CAM boot on RLE all the time due to pain      Mobility Bed Mobility Overal bed mobility: Modified Independent             General bed mobility comments: not assessed.  Transfers Overall transfer level: Needs assistance   Transfers: Sit to/from Stand;Stand Pivot Transfers Sit to Stand: Min guard Stand pivot transfers: Min guard       General transfer comment: not assessed    Balance Overall balance assessment: Needs assistance   Sitting balance-Leahy Scale: Good       Standing balance-Leahy Scale: Poor                              ADL Overall ADL's :  Needs assistance/impaired     Grooming: Oral care;Supervision/safety;Sitting   Upper Body Bathing: Supervision/ safety;Sitting   Lower Body Bathing: Minimal assistance;Sitting/lateral leans   Upper Body Dressing : Supervision/safety;Sitting   Lower Body Dressing: Minimal assistance;Sitting/lateral leans                 General ADL Comments: Pt declined mobility.  Had just finished with PT and c/o RLE pain.  Pt able to sequence ADL task (brushing teeth) without cues. However, pt on phone during beginning of session and attempting to write down phone number.  Person on phone would state number, pt would repeat number but write down the wrong number.  Pt required max cues to identify errors and correct.     Vision                 Additional Comments: Pt at first reporting no change, then stating vision seemed blurry.  Pt able to track in all fields, but also difficult to perform in depth assessment due to cognition.   Perception     Praxis      Pertinent Vitals/Pain Pain Assessment: No/denies pain     Hand Dominance Right   Extremity/Trunk Assessment Upper Extremity Assessment Upper Extremity Assessment: Generalized weakness (LUE testing evenly with RUE)   Lower Extremity Assessment Lower Extremity Assessment: Defer to PT evaluation RLE Deficits / Details: bil LE 3/5 all myotomes, unclear if pt truly resisting or just generally  weak, did not assess right ankle secondary to pt pain and wearing cam boot LLE Deficits / Details: bil LE 3/5 all myotomes, unclear if pt truly resisting or just generally weak       Communication Communication Communication: Expressive difficulties   Cognition Arousal/Alertness: Awake/alert Behavior During Therapy: Restless Overall Cognitive Status: Impaired/Different from baseline Area of Impairment: Attention;Safety/judgement;Awareness   Current Attention Level: Focused Memory: Decreased short-term memory   Safety/Judgement:  Decreased awareness of safety;Decreased awareness of deficits     General Comments: pt with varied responses to PLOF, lack of problem solving, perseverating on phone number during session, unable to repeat or read phone number accurately   General Comments       Exercises       Shoulder Instructions      Home Living Family/patient expects to be discharged to:: Private residence Living Arrangements: Children;Spouse/significant other Available Help at Discharge: Family;Available PRN/intermittently Type of Home: House Home Access: Stairs to enter [employer] of Steps: 3 Entrance Stairs-Rails: Right;Left Home Layout: One level               Home Equipment: Bedside commode;Cane - single point;Walker - 2 wheels;Wheelchair - manual;Shower seat;Grab bars - toilet      Lives With: Daughter;Significant other    Prior Functioning/Environment Level of Independence: Needs assistance  Gait / Transfers Assistance Needed: Per dgtr and pt pt has not walked in over a year and can transfer to [place] on her own with difficulty but WC will not fit easily in kitchen for access to preparing meals. ADL's / Homemaking Assistance Needed:  Pt has assist of family on occasion for bathing and waits til family stops by for meals. Pt alone 12hrs a day on average   Comments:  boot for Right foot, WC for mobility although cane in room and pt trying to state she uses cane to get to nail salon    OT Diagnosis: Generalized weakness;Cognitive deficits;Acute pain   OT Problem List: Decreased strength;Decreased activity tolerance;Impaired balance (sitting and/or standing);Decreased cognition;Decreased safety awareness;Decreased knowledge of use of DME or AE;Pain   OT Treatment/Interventions: Self-care/ADL training;DME and/or AE instruction;Therapeutic activities;Patient/family education;Balance training;Cognitive remediation/compensation    OT Goals(Current goals can be found in the care plan  section) Acute Rehab OT Goals Patient Stated Goal: be able to walk OT Goal Formulation: With patient Time For Goal Achievement: 06/14/14 Potential to Achieve Goals: Good  OT Frequency: Min 2X/week   Barriers to D/C:            Co-evaluation              End of Session    Activity Tolerance: Patient limited by fatigue Patient left: in chair;with call bell/phone within reach   Time: 1107-1122 OT Time Calculation (min): 15 min Charges:  OT General Charges $OT Visit: 1 Procedure OT Evaluation $Initial OT Evaluation Tier I: 1 Procedure OT Treatments $Self Care/Home Management : 8-22 mins G-Codes:    Darrol Jump 05/31/2014, 1:14 PM   05/31/2014 Darrol Jump OTR/L Pager [phone] Office [phone]

## 2014-05-31 NOTE — Progress Notes (Signed)
PT Cancellation Note  Patient Details Name: [name] MRN: [id number] DOB: 08/01/1961   Cancelled Treatment:    Reason Eval/Treat Not Completed: Other (comment) (pt currently on bedrest and await increased activity order)   Lanetta Inch Beth 05/31/2014, 7:05 AM Elwyn Reach, [place]

## 2014-05-31 NOTE — Evaluation (Signed)
Physical Therapy Evaluation Patient Details Name: [name] MRN: [id number] DOB: 03/25/1961 Today's Date: 05/31/2014   History of Present Illness  [name] is an 53 y.o. female, right handed, with a past medical history that is relevant for hypertension, hyperlipidemia, ischemic stroke ion 2013 without residual deficits, COPD, HIV (last CD4 420 and <20 VL on 01/16/14), CAD, chronic congestive heart failure, iron-deficiency anemia, chronic right ankle pain, anxiety/depression, OSA, chronic right ankle pain, comes in with complains of difficulty expressing herselCT brain suggests a new small infarct in the left frontal lobe f  Clinical Impression  Pt pleasant but confused unable to provide accurate PLOF or DME at home and contacted dgtr for accurate picture. Pt has not ambulated for over a year, requires assist for all ADLs. Pt perseverating on phone number when given to her even after changing to another number, unable to copy number accurately. Pt fidgeting throughout and appears restless. Pt with generally varied gaze but was able to visually track when specifically asked to do so. Pt with CAM boot for pain and was able to don with min assist with increased time. Pt demonstrates mobility, cognitive and safety deficits without sufficient help for safety at home and recommend SNF for D/C . Will follow acutely to maximize mobility, function and safety to decrease burden of care.     Follow Up Recommendations SNF;Supervision/Assistance - 24 hour    Equipment Recommendations  None recommended by PT    Recommendations for Other Services       Precautions / Restrictions Precautions Precautions: Fall Precaution Comments: pt states she wears a CAM boot on RLE all the time due to pain      Mobility  Bed Mobility Overal bed mobility: Modified Independent                Transfers Overall transfer level: Needs assistance   Transfers: Sit to/from Stand;Stand Pivot  Transfers Sit to Stand: Min guard Stand pivot transfers: Min guard       General transfer comment: cues for hand placement, sequence and safety with RW and short pivot to transfer bed to chair  Ambulation/Gait Ambulation/Gait assistance:  (pt unable secondary to pain in Right foot in standing)              Stairs            Wheelchair Mobility    Modified Rankin (Stroke Patients Only)       Balance Overall balance assessment: Needs assistance   Sitting balance-Leahy Scale: Good       Standing balance-Leahy Scale: Poor                               Pertinent Vitals/Pain Pain Assessment: No/denies pain    Home Living Family/patient expects to be discharged to:: Private residence Living Arrangements: Children;Spouse/significant other Available Help at Discharge: Family;Available PRN/intermittently Type of Home: House Home Access: Stairs to enter Entrance Stairs-Rails: Psychiatric nurse of Steps: 3 Home Layout: One level Home Equipment: Bedside commode;Cane - single point;Walker - 2 wheels;Wheelchair - manual;Shower seat;Grab bars - toilet      Prior Function Level of Independence: Needs assistance   Gait / Transfers Assistance Needed: Per dgtr and pt pt has not walked in over a year and can transfer to [place] on her own with difficulty but WC will not fit easily in kitchen for access to preparing meals.  ADL's / Homemaking Assistance Needed:  Pt has assist of family on occasion for bathing and waits til family stops by for meals. Pt alone 12hrs a day on average  Comments:  boot for Right foot, WC for mobility although cane in room and pt trying to state she uses cane to get to nail salon     Hand Dominance   Dominant Hand: Right    Extremity/Trunk Assessment   Upper Extremity Assessment: Defer to OT evaluation           Lower Extremity Assessment: Generalized weakness;RLE deficits/detail;LLE deficits/detail RLE  Deficits / Details: bil LE 3/5 all myotomes, unclear if pt truly resisting or just generally weak, did not assess right ankle secondary to pt pain and wearing cam boot LLE Deficits / Details: bil LE 3/5 all myotomes, unclear if pt truly resisting or just generally weak     Communication   Communication: Expressive difficulties  Cognition Arousal/Alertness: Awake/alert Behavior During Therapy: Restless Overall Cognitive Status: Impaired/Different from baseline Area of Impairment: Attention;Safety/judgement;Awareness   Current Attention Level: Focused Memory: Decreased short-term memory   Safety/Judgement: Decreased awareness of safety;Decreased awareness of deficits     General Comments: pt with varied responses to PLOF, lack of problem solving, perseverating on phone number during session, unable to repeat or read phone number accurately    General Comments      Exercises        Assessment/Plan    PT Assessment Patient needs continued PT services  PT Diagnosis Altered mental status;Generalized weakness   PT Problem List Decreased activity tolerance;Decreased strength;Decreased cognition;Decreased balance;Decreased mobility;Pain;Decreased knowledge of use of DME  PT Treatment Interventions Functional mobility training;Therapeutic activities;Therapeutic exercise;Patient/family education;Neuromuscular re-education   PT Goals (Current goals can be found in the Care Plan section) Acute Rehab PT Goals Patient Stated Goal: be able to walk PT Goal Formulation: With patient Time For Goal Achievement: 06/14/14 Potential to Achieve Goals: Fair    Frequency Min 3X/week   Barriers to discharge Decreased caregiver support      Co-evaluation               End of Session   Activity Tolerance: Patient limited by pain Patient left: in chair;with call bell/phone within reach Nurse Communication: Mobility status;Precautions         Time: 6387-5643 PT Time Calculation  (min): 23 min   Charges:   PT Evaluation $Initial PT Evaluation Tier I: 1 Procedure PT Treatments $Therapeutic Activity: 8-22 mins   PT G Codes:          Melford Aase 05/31/2014, 11:35 AM Elwyn Reach, [place]

## 2014-05-31 NOTE — Progress Notes (Signed)
STROKE TEAM PROGRESS NOTE   HISTORY [name] is an 53 y.o. female, right handed, with a past medical history that is relevant for hypertension, hyperlipidemia, ischemic stroke ion 2013 without residual deficits, COPD, HIV (last CD4 420 and <20 VL on 01/16/14), CAD, chronic congestive heart failure, iron-deficiency anemia, chronic right ankle pain, anxiety/depression, OSA, chronic right ankle pain, comes in with complains of difficulty expressing herself. She said that she has not residual language or speech trouble from her previous stroke, but since this past Thursday 05/23/2014 or Friday 05/24/2014 her daughter had noticed that she " makes not sense when taking and can not get the right words correctly'. Patient stated that she is confused about her daughters names, can not speak properly, gets confused understanding the content of text messages sent to her, can not speak fluently because can not get the right words out all the time, and her family tells her that she makes not sense when talking back to them. Complains of episodic HA for the last couple of days but denies vertigo, double vision, difficulty swallowing, focal weakness or numbness, slurred speech, imbalance, or visual disturbances. Stated that her dose of MS contin was increased yesterday but is not sure about the exact dose she is taking at this time.  No recent fever, infection, or head trauma. Serologies unimpressive. CT brain suggest a new small infarct in the left frontal lobe (MCA territory). She is on plavix. Last known well uncertain. Patient was not administered TPA secondary to delay in arrival. She was admitted for further evaluation and treatment.   SUBJECTIVE (INTERVAL HISTORY)  No family is at the bedside.  Overall she feels her condition  she is still having difficulty expressing herself and she is not sure why.  OBJECTIVE Temp:  [97.8 F (36.6 C)-98.9 F (37.2 C)] 98.5 F (36.9 C) (09/25 0830) Pulse Rate:  [73-80] 78  (09/25 1121) Cardiac Rhythm:  [-] Normal sinus rhythm (09/25 0632) Resp:  [16-18] 16 (09/25 0830) BP: (80-129)/(49-78) 117/61 mmHg (09/25 0830) SpO2:  [95 %-100 %] 99 % (09/25 0830)   Recent Labs Lab 05/30/14 1319  GLUCAP 99    Recent Labs Lab 05/29/14 1929 05/31/14 1313  NA 139 139  K 2.9* 3.6*  CL 103 102  CO2 24 28  GLUCOSE 125* 86  BUN 9 12  CREATININE 0.77 0.90  CALCIUM 9.0 9.0  MG 1.9  --     Recent Labs Lab 05/29/14 1929  AST 19  ALT 9  ALKPHOS 59  BILITOT 0.6  PROT 8.1  ALBUMIN 3.5    Recent Labs Lab 05/29/14 1929  WBC 4.0  NEUTROABS 2.0  HGB 12.6  HCT 39.9  MCV 78.1  PLT 215    Recent Labs Lab 05/30/14 0415 05/30/14 1018 05/30/14 1925  TROPONINI <0.30 <0.30 <0.30    Recent Labs  05/30/14 0117  LABPROT 13.9  INR 1.07    Recent Labs  05/30/14 0103  COLORURINE YELLOW  LABSPEC 1.018  PHURINE 6.0  GLUCOSEU NEGATIVE  HGBUR LARGE*  BILIRUBINUR NEGATIVE  KETONESUR NEGATIVE  PROTEINUR NEGATIVE  UROBILINOGEN 1.0  NITRITE NEGATIVE  LEUKOCYTESUR NEGATIVE       Component Value Date/Time   CHOL 186 05/30/2014 0415   TRIG 86 05/30/2014 0415   HDL 43 05/30/2014 0415   CHOLHDL 4.3 05/30/2014 0415   VLDL 17 05/30/2014 0415   LDLCALC 126* 05/30/2014 0415   Lab Results  Component Value Date   HGBA1C 5.5 05/30/2014  Component Value Date/Time   LABOPIA POSITIVE* 05/30/2014 2340   LABOPIA PPS 05/16/2014 1215   COCAINSCRNUR POSITIVE* 05/30/2014 2340   COCAINSCRNUR PPS 05/16/2014 1215   LABBENZ NONE DETECTED 05/30/2014 2340   LABBENZ NEG 05/16/2014 1215   LABBENZ NEGATIVE 05/23/2009 0542   AMPHETMU NONE DETECTED 05/30/2014 2340   AMPHETMU NEGATIVE 05/23/2009 0542   THCU NONE DETECTED 05/30/2014 2340   LABBARB NONE DETECTED 05/30/2014 2340   LABBARB NEG 05/16/2014 1215    No results found for this basename: ETH,  in the last 168 hours  Dg Chest 2 View  05/30/2014   CLINICAL DATA:  Stroke.  EXAM: CHEST  2 VIEW  COMPARISON:  10/02/2013   FINDINGS: There is mild cardiomegaly. Negative upper mediastinal contours. Implantable loop recorder within the left chest wall. There is no edema, consolidation, effusion, or pneumothorax.  IMPRESSION: Cardiomegaly without edema.   Electronically Signed   By: [name] M.D.   On: 05/30/2014 04:39   Ct Head Wo Contrast  05/30/2014   CLINICAL DATA:  Intermittent confusion and disorientation.  EXAM: CT HEAD WITHOUT CONTRAST  TECHNIQUE: Contiguous axial images were obtained from the base of the skull through the vertex without intravenous contrast.  COMPARISON:  10/02/2013.  FINDINGS: Skull and Sinuses:Negative for fracture or destructive process. The mastoids, middle ears, and imaged paranasal sinuses are clear.  Orbits: Status post right scleral banding and cataract resection. No acute orbital findings.  Brain: There is new was small area of cortical and subcortical low-density in the left frontal lobe anteriorly. New deep white matter low-attenuation in the same region.  Remote right superior cerebellar infarct and right temporal lobe infarct along the sylvian fissure. Remote small vessel ischemic change in the right centrum semiovale.  No evidence of hydrocephalus, mass lesion, or shift.  IMPRESSION: 1. New small infarct in the left frontal lobe (MCA territory). MRI could confirm and determine acuity. 2. Remote right cerebellar and right cerebral infarcts.   Electronically Signed   By: [name] M.D.   On: 05/30/2014 01:08   Mr [name] Head Wo Contrast  05/30/2014   CLINICAL DATA:  Difficulty with slurred speech beginning several days ago. Hypertension. HIV. History of prior strokes. Confusion.  EXAM: MRI HEAD WITHOUT CONTRAST  MRA HEAD WITHOUT CONTRAST  TECHNIQUE: Multiplanar, multiecho pulse sequences of the brain and surrounding structures were obtained without intravenous contrast. Angiographic images of the head were obtained using MRA technique without contrast.  COMPARISON:  None.  CT head  earlier today.  MR head 10/02/2013.  FINDINGS: MRI HEAD FINDINGS  There is a moderately large area of restricted diffusion consistent with acute infarction affecting the LEFT frontal cortex extending to the operculum, as well as extending into the deep white matter. Some central areas of normalized diffusion could reflect an early subacute time course. There is minor involvement of the LEFT insula/ LEFT external capsule. There is no midline shift. No acute or subacute hemorrhage is evident.  Areas of chronic cerebral and cerebellar infarction as outlined on previous MR from January. These involve most notably the RIGHT superior cerebellum and RIGHT posterior frontal subcortical white matter. Mild subcortical and periventricular T2 and FLAIR hyperintensities, likely chronic microvascular ischemic change. No foci of chronic hemorrhage. Flow voids are maintained. No midline abnormality. Cervical spondylosis, incompletely evaluated. Visualized paranasal sinuses, and mastoids appear unremarkable. Previous RIGHT ocular surgery. No osseous findings.  MRA HEAD FINDINGS  Motion degraded exam.  The internal carotid arteries are widely patent. The basilar artery is  widely patent with vertebrals codominant. There is no proximal intracranial stenosis or aneurysm.  There is no evidence for a proximal LEFT MCA lesion. Normal LEFT MCA bifurcation. There appears to be premature truncation of a LEFT M3 branch subserving the area of the LEFT frontal lobe, which could be embolic or thrombotic in nature.  IMPRESSION: Moderately large area of acute to early subacute infarction without hemorrhage, affecting the LEFT frontal cortex, subcortical white matter, with minor involvement of LEFT insula. No midline shift.  No proximal intracranial stenosis on MRA. It is possible that a LEFT MCA M3 branch subserving the area of infarction is prematurely truncated.   Electronically Signed   By: Rolla Flatten M.D.   On: 05/30/2014 12:20   Mr Brain  Wo Contrast  05/30/2014   CLINICAL DATA:  Difficulty with slurred speech beginning several days ago. Hypertension. HIV. History of prior strokes. Confusion.  EXAM: MRI HEAD WITHOUT CONTRAST  MRA HEAD WITHOUT CONTRAST  TECHNIQUE: Multiplanar, multiecho pulse sequences of the brain and surrounding structures were obtained without intravenous contrast. Angiographic images of the head were obtained using MRA technique without contrast.  COMPARISON:  None.  CT head earlier today.  MR head 10/02/2013.  FINDINGS: MRI HEAD FINDINGS  There is a moderately large area of restricted diffusion consistent with acute infarction affecting the LEFT frontal cortex extending to the operculum, as well as extending into the deep white matter. Some central areas of normalized diffusion could reflect an early subacute time course. There is minor involvement of the LEFT insula/ LEFT external capsule. There is no midline shift. No acute or subacute hemorrhage is evident.  Areas of chronic cerebral and cerebellar infarction as outlined on previous MR from January. These involve most notably the RIGHT superior cerebellum and RIGHT posterior frontal subcortical white matter. Mild subcortical and periventricular T2 and FLAIR hyperintensities, likely chronic microvascular ischemic change. No foci of chronic hemorrhage. Flow voids are maintained. No midline abnormality. Cervical spondylosis, incompletely evaluated. Visualized paranasal sinuses, and mastoids appear unremarkable. Previous RIGHT ocular surgery. No osseous findings.  MRA HEAD FINDINGS  Motion degraded exam.  The internal carotid arteries are widely patent. The basilar artery is widely patent with vertebrals codominant. There is no proximal intracranial stenosis or aneurysm.  There is no evidence for a proximal LEFT MCA lesion. Normal LEFT MCA bifurcation. There appears to be premature truncation of a LEFT M3 branch subserving the area of the LEFT frontal lobe, which could be embolic  or thrombotic in nature.  IMPRESSION: Moderately large area of acute to early subacute infarction without hemorrhage, affecting the LEFT frontal cortex, subcortical white matter, with minor involvement of LEFT insula. No midline shift.  No proximal intracranial stenosis on MRA. It is possible that a LEFT MCA M3 branch subserving the area of infarction is prematurely truncated.   Electronically Signed   By: Rolla Flatten M.D.   On: 05/30/2014 12:20    PHYSICAL EXAM Obese middle-aged [place] American lady currently not in distress.Awake alert. Afebrile. Head is nontraumatic. Neck is supple without bruit. Hearing is normal. Cardiac exam no murmur or gallop. Lungs are clear to auscultation. Distal pulses are well felt. Neurological Exam :  AOx3. Mild expressive aphasia with occasional difficulties. No paraphasias. Decreased naming and repetition. Comprehension appears good. Mild right lower facial asymmetry. Midline. Palatal movements are normal. Motor system exam revealed no upper pole eczema to drift. Symmetric and equal strength in all 4 extremities. Sensation appears preserved bilaterally. Coordination appears normal. Gait was not  tested.  ASSESSMENT/PLAN  Ms. HEIDEMARIE GOODNOW is a 53 y.o. female with history of hypertension, hyperlipidemia, AIS in 2013 without residual deficits, COPD, HIV, CAD, CHF, anemia, OSA presenting with difficulty expressing herself for several days. She did not receive IV t-PA due to delay in arrival. MRI imaging confirms a left moderate size frontal cortex, subcortical white matter and left insular infarct.   Stroke:  left moderate size frontal cortex, subcortical white matter and left insular, embolic secondary to unknown cause -  Cryptogenic strokes; Consider w/u for occult malignancy as not done previously  clopidogrel 75 mg orally every day prior to admission, now on aspirin 81 mg orally every day and clopidogrel 75 mg orally every day. Based on current study findings, do  not recommend the use of dual antiplatelets given increased risk for intracerebral hemorrhage.  MRI  left moderate size frontal cortex, subcortical white matter and left insular infarct  MRA  ? L MCA M3 branch truncated, o/w no proximal IC stenosis Carotid Doppler  1- 39 percent stenosis involving the right internal carotid artery and the left internal carotid artery.    2D Echo  Left ventricle: The cavity size was normal. Systolic function was moderately to severely reduced. The estimated ejection fraction was in the range of 30% to 35%. Moderate diffuse hypokinesis. Akinesis of the anteroseptal, anterior, and apical myocardium    SCDs for VTE prophylaxis  Loop recorder placed for cryptogenic stroke 09/2013. Loop interrogated in ED. Per Dr. Rayann Heman, PACS and aberrance but no atrial fibrillation detected.   F/U UDS results.    heart healthy/carb modified clear liquids.   Bedrest  Resultant expressive aphasia  Therapy recommendations:  pending   Ongoing aggressive risk factor management  Disposition:  Anticipate discharge home  Hypertension   Permissive hypertension <220/120 for 24-48 hours and then gradually normalize within 5-7 days  BP goal long term normotensive  Home meds:  Coreg and Imdur resumed in hospital. Was also on lisinopril, not resumed due to stroke BP 100-135/69-79 past 24h (05/31/2014 @ 3:57 PM) Stable  Hyperlipidemia  Home meds:  crestor 40. Not resumed in hospital  Allergic to atorvastatin (affects her memory)  LDL 126   At LDL goal < 70 for diabetics  Resume statin at discharge  Diabetes  HgbA1c 5.5   Controlled  At Goal < 7.0  Other Stroke Risk Factors Former cigarette smoker ETOH use   Morbid obesity, Body mass index is 42.06 kg/(m^2).    Hx stroke - R insular ischemic CVA ; in Januray  7322 and prior embolic stroke in April 0254   Family hx stroke (maternal aunt)   Coronary artery disease Obstructive sleep apnea, on CPAP at  home HIV CHF. EF 25% 09/2013.euvolemic.  Other Active Problems  COPD  Hospital day # 2  SHARON BIBY, MSN, RN, ANVP-BC, ANP-BC, [facility] Pager: [phone] 05/31/2014 3:57 PM  I have personally examined this patient, reviewed notes, independently viewed imaging studies, participated in medical decision making and plan of care. I have made any additions or clarifications directly to the above note. Agree with note above.  Patient has recently had an extensive evaluation for cryptogenic stroke including loop recorder placement.Marland Kitchen Hence will not do extensive testing again. Consider participation in the [place], MD Medical Director [place] Pager: [phone] 05/31/2014 3:57 PM    To contact Stroke Continuity provider, please refer to [email or web address] After hours, contact General Neurology

## 2014-05-31 NOTE — Progress Notes (Addendum)
  Date: 05/31/2014  Patient name: [name]  Medical record number: [id number]  Date of birth: December 21, 1960   This patient has been seen and the plan of care was discussed with the house staff. Please see their note for complete details. I concur with their findings with the following additions/corrections:  Embolic etiology of stroke per Neurology, unclear source.  Evaluate for occult malignancy.  If negative work up, can likely be discharged to SNF in the near future.   Sid Falcon, MD 05/31/2014, 11:02 PM

## 2014-05-31 NOTE — Progress Notes (Signed)
OT Cancellation Note  Patient Details Name: [name] MRN: [id number] DOB: October 25, 1960   Cancelled Treatment:    Reason Eval/Treat Not Completed:  (pt on bedrest). Will await increased acitvity order.    05/31/2014 Darrol Jump OTR/L Pager [phone] Office [phone]

## 2014-05-31 NOTE — Progress Notes (Signed)
Subjective: Ms. Tullos reports nausea/vomiting and abdominal pain this morning. Denies new weakness or paresthesias. Denies chest pain or shortness of breath.  Objective: Vital signs in last 24 hours: Filed Vitals:   05/31/14 0600 05/31/14 0830 05/31/14 1121 05/31/14 1627  BP: 110/55 117/61  119/61  Pulse: 77 73 78 78  Temp: 97.9 F (36.6 C) 98.5 F (36.9 C)  98.1 F (36.7 C)  TempSrc: Oral Oral  Oral  Resp: _0 Height:      Weight:      SpO2: 99% 99%  96%   Weight change:   Intake/Output Summary (Last 24 hours) at 05/31/14 1731 Last data filed at 05/31/14 0120  Gross per 24 hour  Intake    480 ml  Output    350 ml  Net    130 ml   General: NAD Lungs: Clear to auscultation bilaterally  Heart: RRR, no m/r/g Abdomen: Bowel sounds present, soft, tender to palpation diffusely but mostly in the RLQ, nondistended. Extremities: No bilateral ankle edema, palpable pulses bilaterally Neurologic: Alert and oriented x3. Mild R lower facial droop, otherwise CNII-XII intact. No slurred speech. 5/5 strength throughout and sensation intact.  Lab Results: Basic Metabolic Panel:  Recent Labs Lab 05/29/14 1929 05/31/14 1313  NA 139 139  K 2.9* 3.6*  CL 103 102  CO2 24 28  GLUCOSE 125* 86  BUN 9 12  CREATININE 0.77 0.90  CALCIUM 9.0 9.0  MG 1.9  --    Liver Function Tests:  Recent Labs Lab 05/29/14 1929  AST 19  ALT 9  ALKPHOS 59  BILITOT 0.6  PROT 8.1  ALBUMIN 3.5   CBC:  Recent Labs Lab 05/29/14 1929  WBC 4.0  NEUTROABS 2.0  HGB 12.6  HCT 39.9  MCV 78.1  PLT 215   Cardiac Enzymes:  Recent Labs Lab 05/30/14 0415 05/30/14 1018 05/30/14 1925  TROPONINI <0.30 <0.30 <0.30   CBG:  Recent Labs Lab 05/30/14 1319  GLUCAP 99   Hemoglobin A1C:  Recent Labs Lab 05/30/14 0117  HGBA1C 5.5   Fasting Lipid Panel:  Recent Labs Lab 05/30/14 0415  CHOL 186  HDL 43  LDLCALC 126*  TRIG 86  CHOLHDL 4.3   Coagulation:  Recent  Labs Lab 05/30/14 0117  LABPROT 13.9  INR 1.07   Urine Drug Screen: Drugs of Abuse     Component Value Date/Time   LABOPIA POSITIVE* 05/30/2014 2340   LABOPIA PPS 05/16/2014 1215   COCAINSCRNUR POSITIVE* 05/30/2014 2340   COCAINSCRNUR PPS 05/16/2014 1215   LABBENZ NONE DETECTED 05/30/2014 2340   LABBENZ NEG 05/16/2014 1215   LABBENZ NEGATIVE 05/23/2009 0542   AMPHETMU NONE DETECTED 05/30/2014 2340   AMPHETMU NEGATIVE 05/23/2009 0542   THCU NONE DETECTED 05/30/2014 2340   LABBARB NONE DETECTED 05/30/2014 2340   LABBARB NEG 05/16/2014 1215    Urinalysis:  Recent Labs Lab 05/30/14 0103  COLORURINE YELLOW  LABSPEC 1.018  PHURINE 6.0  GLUCOSEU NEGATIVE  HGBUR LARGE*  BILIRUBINUR NEGATIVE  KETONESUR NEGATIVE  PROTEINUR NEGATIVE  UROBILINOGEN 1.0  NITRITE NEGATIVE  LEUKOCYTESUR NEGATIVE    Micro Results: No results found for this or any previous visit (from the past 240 hour(s)). Studies/Results: Dg Chest 2 View  05/30/2014   CLINICAL DATA:  Stroke.  EXAM: CHEST  2 VIEW  COMPARISON:  10/02/2013  FINDINGS: There is mild cardiomegaly. Negative upper mediastinal contours. Implantable loop recorder within the left chest wall. There is no edema, consolidation, effusion, or  pneumothorax.  IMPRESSION: Cardiomegaly without edema.   Electronically Signed   By: [name] M.D.   On: 05/30/2014 04:39   Ct Head Wo Contrast  05/30/2014   CLINICAL DATA:  Intermittent confusion and disorientation.  EXAM: CT HEAD WITHOUT CONTRAST  TECHNIQUE: Contiguous axial images were obtained from the base of the skull through the vertex without intravenous contrast.  COMPARISON:  10/02/2013.  FINDINGS: Skull and Sinuses:Negative for fracture or destructive process. The mastoids, middle ears, and imaged paranasal sinuses are clear.  Orbits: Status post right scleral banding and cataract resection. No acute orbital findings.  Brain: There is new was small area of cortical and subcortical low-density in the left  frontal lobe anteriorly. New deep white matter low-attenuation in the same region.  Remote right superior cerebellar infarct and right temporal lobe infarct along the sylvian fissure. Remote small vessel ischemic change in the right centrum semiovale.  No evidence of hydrocephalus, mass lesion, or shift.  IMPRESSION: 1. New small infarct in the left frontal lobe (MCA territory). MRI could confirm and determine acuity. 2. Remote right cerebellar and right cerebral infarcts.   Electronically Signed   By: [name] M.D.   On: 05/30/2014 01:08   Mr [name] Head Wo Contrast  05/30/2014   CLINICAL DATA:  Difficulty with slurred speech beginning several days ago. Hypertension. HIV. History of prior strokes. Confusion.  EXAM: MRI HEAD WITHOUT CONTRAST  MRA HEAD WITHOUT CONTRAST  TECHNIQUE: Multiplanar, multiecho pulse sequences of the brain and surrounding structures were obtained without intravenous contrast. Angiographic images of the head were obtained using MRA technique without contrast.  COMPARISON:  None.  CT head earlier today.  MR head 10/02/2013.  FINDINGS: MRI HEAD FINDINGS  There is a moderately large area of restricted diffusion consistent with acute infarction affecting the LEFT frontal cortex extending to the operculum, as well as extending into the deep white matter. Some central areas of normalized diffusion could reflect an early subacute time course. There is minor involvement of the LEFT insula/ LEFT external capsule. There is no midline shift. No acute or subacute hemorrhage is evident.  Areas of chronic cerebral and cerebellar infarction as outlined on previous MR from January. These involve most notably the RIGHT superior cerebellum and RIGHT posterior frontal subcortical white matter. Mild subcortical and periventricular T2 and FLAIR hyperintensities, likely chronic microvascular ischemic change. No foci of chronic hemorrhage. Flow voids are maintained. No midline abnormality. Cervical  spondylosis, incompletely evaluated. Visualized paranasal sinuses, and mastoids appear unremarkable. Previous RIGHT ocular surgery. No osseous findings.  MRA HEAD FINDINGS  Motion degraded exam.  The internal carotid arteries are widely patent. The basilar artery is widely patent with vertebrals codominant. There is no proximal intracranial stenosis or aneurysm.  There is no evidence for a proximal LEFT MCA lesion. Normal LEFT MCA bifurcation. There appears to be premature truncation of a LEFT M3 branch subserving the area of the LEFT frontal lobe, which could be embolic or thrombotic in nature.  IMPRESSION: Moderately large area of acute to early subacute infarction without hemorrhage, affecting the LEFT frontal cortex, subcortical white matter, with minor involvement of LEFT insula. No midline shift.  No proximal intracranial stenosis on MRA. It is possible that a LEFT MCA M3 branch subserving the area of infarction is prematurely truncated.   Electronically Signed   By: Rolla Flatten M.D.   On: 05/30/2014 12:20   Mr Brain Wo Contrast  05/30/2014   CLINICAL DATA:  Difficulty with slurred speech beginning  several days ago. Hypertension. HIV. History of prior strokes. Confusion.  EXAM: MRI HEAD WITHOUT CONTRAST  MRA HEAD WITHOUT CONTRAST  TECHNIQUE: Multiplanar, multiecho pulse sequences of the brain and surrounding structures were obtained without intravenous contrast. Angiographic images of the head were obtained using MRA technique without contrast.  COMPARISON:  None.  CT head earlier today.  MR head 10/02/2013.  FINDINGS: MRI HEAD FINDINGS  There is a moderately large area of restricted diffusion consistent with acute infarction affecting the LEFT frontal cortex extending to the operculum, as well as extending into the deep white matter. Some central areas of normalized diffusion could reflect an early subacute time course. There is minor involvement of the LEFT insula/ LEFT external capsule. There is no  midline shift. No acute or subacute hemorrhage is evident.  Areas of chronic cerebral and cerebellar infarction as outlined on previous MR from January. These involve most notably the RIGHT superior cerebellum and RIGHT posterior frontal subcortical white matter. Mild subcortical and periventricular T2 and FLAIR hyperintensities, likely chronic microvascular ischemic change. No foci of chronic hemorrhage. Flow voids are maintained. No midline abnormality. Cervical spondylosis, incompletely evaluated. Visualized paranasal sinuses, and mastoids appear unremarkable. Previous RIGHT ocular surgery. No osseous findings.  MRA HEAD FINDINGS  Motion degraded exam.  The internal carotid arteries are widely patent. The basilar artery is widely patent with vertebrals codominant. There is no proximal intracranial stenosis or aneurysm.  There is no evidence for a proximal LEFT MCA lesion. Normal LEFT MCA bifurcation. There appears to be premature truncation of a LEFT M3 branch subserving the area of the LEFT frontal lobe, which could be embolic or thrombotic in nature.  IMPRESSION: Moderately large area of acute to early subacute infarction without hemorrhage, affecting the LEFT frontal cortex, subcortical white matter, with minor involvement of LEFT insula. No midline shift.  No proximal intracranial stenosis on MRA. It is possible that a LEFT MCA M3 branch subserving the area of infarction is prematurely truncated.   Electronically Signed   By: Rolla Flatten M.D.   On: 05/30/2014 12:20   Medications: I have reviewed the patient's current medications. Scheduled Meds: . beclomethasone  2 puff Inhalation BID  . clopidogrel  75 mg Oral Daily  . elvitegravir-cobicistat-emtricitabine-tenofovir  1 tablet Oral Q breakfast  . ferrous sulfate  325 mg Oral Q breakfast  . morphine  30 mg Oral TID  . multivitamin with minerals  1 tablet Oral Daily  . rosuvastatin  40 mg Oral q1800  . 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 No midline shift.  No proximal intracranial stenosis on MRA. It is possible that a LEFT MCA M3 branch subserving the area of infarction is prematurely truncated. Carotid doppler with 1-39% bilateral internal carotid. Echo with LV EF 30-35% and moderate diffuse hypokinesis. Troponin neg x 3 and EKG unchanged. Fasting lipid panel and Hgb A1c wnl. Neuro recommending workup for occult malignancy and d/c asa 81. PT/OT recommending  SNF. Passed bedside swallow eval. SLP recommending home health SLP. - continue home Plavix  - d/c ASA  - continue statin - CT Chest/Abd/Pelvis with contrast, Obtain ESR  HTN: on Coreg 3.153m BID, Imdur 635mdaily, lisinopril 2016maily at home - allowing permissive HTN. Hold Coreg, Imdur and lisinopril.  COPD: -Albuterol neb 2.5 Q6hr prn -D/c QVAR -switch to dulera 100-5mc53m puff BID  HIV: well controlled. Recent CD4 420, viral load less than 20 on 01/16/14  - continue home meds   CHF: Echo with LV EF 30-35% and moderate diffuse hypokinesis.  -Holding lisinopril and Coreg  FEN: Carb modified/heart healthy  DVT ppx: SCDs   Dispo: Disposition is deferred at this time, awaiting improvement of current medical problems.  Anticipated discharge in approximately 1-2 day(s).   The  patient does have a current PCP (EliBartholomew Crews) and does need [facility] after discharge.  The patient does not have transportation limitations that hinder transportation to clinic appointments.  .Services Needed at time of discharge: Y = Yes, Blank = No PT: SNF  OT: SNF  RN:   Equipment:   Other: Home health SLP    LOS: 2 days   JennJacques Earthly 05/31/2014, 5:31 PM
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
## 2014-05-31 NOTE — Care Management Note (Unsigned)
    Page 1 of 1   05/31/2014     4:00:08 PM CARE MANAGEMENT NOTE 05/31/2014  Patient:  [name], [name]   Account Number:  [id number]  Date Initiated:  05/31/2014  Documentation initiated by:  GRAVES-BIGELOW,Atharva Mirsky  Subjective/Objective Assessment:   Pt admitted for AMS.     Action/Plan:   CM to monitor for disposiiton needs.   Anticipated DC Date:  06/03/2014   Anticipated DC Plan:  [place]  CM consult      Choice offered to / List presented to:             Status of service:  In process, will continue to follow Medicare Important Message given?  YES (If response is "NO", the following Medicare IM given date fields will be blank) Date Medicare IM given:  05/31/2014 Medicare IM given by:  GRAVES-BIGELOW,Encarnacion Scioneaux Date Additional Medicare IM given:   Additional Medicare IM given by:    Discharge Disposition:    Per UR Regulation:  Reviewed for med. necessity/level of care/duration of stay  If discussed at [place] of Stay Meetings, dates discussed:    Comments:

## 2014-06-01 LAB — CBC
HCT: 35.3 % — ABNORMAL LOW (ref 36.0–46.0)
Hemoglobin: 10.8 g/dL — ABNORMAL LOW (ref 12.0–15.0)
MCH: 25.1 pg — ABNORMAL LOW (ref 26.0–34.0)
MCHC: 30.6 g/dL (ref 30.0–36.0)
MCV: 82.1 fL (ref 78.0–100.0)
PLATELETS: 184 10*3/uL (ref 150–400)
RBC: 4.3 MIL/uL (ref 3.87–5.11)
RDW: 15.1 % (ref 11.5–15.5)
WBC: 4.1 10*3/uL (ref 4.0–10.5)

## 2014-06-01 LAB — BASIC METABOLIC PANEL
Anion gap: 10 (ref 5–15)
BUN: 9 mg/dL (ref 6–23)
CO2: 28 mEq/L (ref 19–32)
Calcium: 8.8 mg/dL (ref 8.4–10.5)
Chloride: 103 mEq/L (ref 96–112)
Creatinine, Ser: 0.85 mg/dL (ref 0.50–1.10)
GFR calc non Af Amer: 77 mL/min — ABNORMAL LOW (ref >90)
GFR, EST AFRICAN AMERICAN: 89 mL/min — AB (ref >90)
Glucose, Bld: 89 mg/dL (ref 70–99)
Potassium: 3.8 mEq/L (ref 3.7–5.3)
SODIUM: 141 meq/L (ref 137–147)

## 2014-06-01 LAB — SEDIMENTATION RATE: Sed Rate: 46 mm/hr — ABNORMAL HIGH (ref 0–22)

## 2014-06-01 MED ORDER — ASPIRIN 81 MG PO TBEC: 81.0000 mg | DELAYED_RELEASE_TABLET | Freq: Every day | ORAL | Status: DC

## 2014-06-01 MED ORDER — CARVEDILOL 3.125 MG PO TABS: 3.1250 mg | ORAL_TABLET | Freq: Two times a day (BID) | ORAL | Status: DC

## 2014-06-01 MED ORDER — ASPIRIN EC 81 MG PO TBEC
81.0000 mg | DELAYED_RELEASE_TABLET | Freq: Every day | ORAL | Status: DC
  Administered 2014-06-01 – 2014-06-02 (×2): 81 mg via ORAL
  Filled 2014-06-01 (×3): qty 1

## 2014-06-01 MED ORDER — ISOSORBIDE MONONITRATE ER 60 MG PO TB24: 60.0000 mg | ORAL_TABLET | Freq: Every evening | ORAL | Status: DC

## 2014-06-01 MED ORDER — LISINOPRIL 20 MG PO TABS: 20.0000 mg | ORAL_TABLET | Freq: Every day | ORAL | Status: DC

## 2014-06-01 NOTE — Progress Notes (Signed)
STROKE TEAM PROGRESS NOTE   HISTORY [name] is an 53 y.o. female, right handed, with a past medical history that is relevant for hypertension, hyperlipidemia, ischemic stroke ion 2013 without residual deficits, COPD, HIV (last CD4 420 and <20 VL on 01/16/14), CAD, chronic congestive heart failure, iron-deficiency anemia, chronic right ankle pain, anxiety/depression, OSA, chronic right ankle pain, comes in with complains of difficulty expressing herself. She said that she has not residual language or speech trouble from her previous stroke, but since this past Thursday 05/23/2014 or Friday 05/24/2014 her daughter had noticed that she " makes not sense when taking and can not get the right words correctly'. Patient stated that she is confused about her daughters names, can not speak properly, gets confused understanding the content of text messages sent to her, can not speak fluently because can not get the right words out all the time, and her family tells her that she makes not sense when talking back to them. Complains of episodic HA for the last couple of days but denies vertigo, double vision, difficulty swallowing, focal weakness or numbness, slurred speech, imbalance, or visual disturbances. Stated that her dose of MS contin was increased yesterday but is not sure about the exact dose she is taking at this time.  No recent fever, infection, or head trauma. Serologies unimpressive. CT brain suggest a new small infarct in the left frontal lobe (MCA territory). She is on plavix. Last known well uncertain. Patient was not administered TPA secondary to delay in arrival. She was admitted for further evaluation and treatment.   SUBJECTIVE (INTERVAL HISTORY)  No family is at the bedside.  Overall she feels her condition  she is still having difficulty expressing herself and she is not sure why. CO R ankle pain -- S/P surgery.   OBJECTIVE Temp:  [98.1 F (36.7 C)-99.2 F (37.3 C)] 99.2 F (37.3 C)  (09/26 0759) Pulse Rate:  [78-86] 79 (09/26 0759) Cardiac Rhythm:  [-] Normal sinus rhythm (09/25 2026) Resp:  [16-18] 18 (09/26 0759) BP: (114-139)/(61-79) 123/61 mmHg (09/26 0759) SpO2:  [95 %-100 %] 95 % (09/26 0759)   Recent Labs Lab 05/30/14 1319  GLUCAP 99    Recent Labs Lab 05/29/14 1929 05/31/14 1313 06/01/14 0530  NA 139 139 141  K 2.9* 3.6* 3.8  CL 103 102 103  CO2 24 28 28   GLUCOSE 125* 86 89  BUN 9 12 9   CREATININE 0.77 0.90 0.85  CALCIUM 9.0 9.0 8.8  MG 1.9  --   --     Recent Labs Lab 05/29/14 1929  AST 19  ALT 9  ALKPHOS 59  BILITOT 0.6  PROT 8.1  ALBUMIN 3.5    Recent Labs Lab 05/29/14 1929 06/01/14 0530  WBC 4.0 4.1  NEUTROABS 2.0  --   HGB 12.6 10.8*  HCT 39.9 35.3*  MCV 78.1 82.1  PLT 215 184    Recent Labs Lab 05/30/14 0415 05/30/14 1018 05/30/14 1925  TROPONINI <0.30 <0.30 <0.30    Recent Labs  05/30/14 0117  LABPROT 13.9  INR 1.07    Recent Labs  05/30/14 0103  COLORURINE YELLOW  LABSPEC 1.018  PHURINE 6.0  GLUCOSEU NEGATIVE  HGBUR LARGE*  BILIRUBINUR NEGATIVE  KETONESUR NEGATIVE  PROTEINUR NEGATIVE  UROBILINOGEN 1.0  NITRITE NEGATIVE  LEUKOCYTESUR NEGATIVE       Component Value Date/Time   CHOL 186 05/30/2014 0415   TRIG 86 05/30/2014 0415   HDL 43 05/30/2014 0415   CHOLHDL  4.3 05/30/2014 0415   VLDL 17 05/30/2014 0415   LDLCALC 126* 05/30/2014 0415   Lab Results  Component Value Date   HGBA1C 5.5 05/30/2014      Component Value Date/Time   LABOPIA POSITIVE* 05/30/2014 2340   LABOPIA PPS 05/16/2014 1215   COCAINSCRNUR POSITIVE* 05/30/2014 2340   COCAINSCRNUR PPS 05/16/2014 1215   LABBENZ NONE DETECTED 05/30/2014 2340   LABBENZ NEG 05/16/2014 1215   LABBENZ NEGATIVE 05/23/2009 0542   AMPHETMU NONE DETECTED 05/30/2014 2340   AMPHETMU NEGATIVE 05/23/2009 0542   THCU NONE DETECTED 05/30/2014 2340   LABBARB NONE DETECTED 05/30/2014 2340   LABBARB NEG 05/16/2014 1215    No results found for this basename:  ETH,  in the last 168 hours  Ct Chest W Contrast 05/31/2014    Enlargement of LEFT ventricle.  Post cholecystectomy.  Otherwise negative CT chest, abdomen and pelvis.      Mri / Mra Head Wo Contrast 05/30/2014    Moderately large area of acute to early subacute infarction without hemorrhage, affecting the LEFT frontal cortex, subcortical white matter, with minor involvement of LEFT insula. No midline shift.  No proximal intracranial stenosis on MRA. It is possible that a LEFT MCA M3 branch subserving the area of infarction is prematurely truncated.       Ct Abdomen Pelvis W Contrast 05/31/2014    Enlargement of LEFT ventricle.  Post cholecystectomy.  Otherwise negative CT chest, abdomen and pelvis.       PHYSICAL EXAM Obese middle-aged [place] American lady currently not in distress.Awake alert. Afebrile. Head is nontraumatic. Neck is supple without bruit. Hearing is normal. Cardiac exam no murmur or gallop. Lungs are clear to auscultation. Distal pulses are well felt. Neurological Exam :  AOx3. Mild expressive aphasia with occasional difficulties. No paraphasias. Decreased naming and repetition. Comprehension appears good. Mild right lower facial asymmetry. Midline. Palatal movements are normal. Motor system exam revealed no upper pole eczema to drift. Symmetric and equal strength in all 4 extremities--  BOTH DELTOIDS 4/5. Legs 5/5. Sensation appears preserved bilaterally. Coordination appears normal. Gait was not tested.  ASSESSMENT/PLAN  [name] is a 53 y.o. female with history of hypertension, hyperlipidemia, AIS in 2013 without residual deficits, COPD, HIV, CAD, CHF, anemia, OSA presenting with difficulty expressing herself for several days. She did not receive IV t-PA due to delay in arrival. MRI imaging confirms a left moderate size frontal cortex, subcortical white matter and left insular infarct.   Stroke:  left moderate size frontal cortex, subcortical white matter and  left insular, embolic secondary to unknown cause -  Cryptogenic strokes; Consider w/u for occult malignancy as not done previously  clopidogrel 75 mg orally every day prior to admission, now on aspirin 81 mg orally every day and clopidogrel 75 mg orally every day. Based on current study findings, do not recommend the use of dual antiplatelets given increased risk for intracerebral hemorrhage. Dual antiplatelets NO LONGER THAN 3 MONTHS due hemorrhage risk.   MRI  left moderate size frontal cortex, subcortical white matter and left insular infarct  MRA  ? L MCA M3 branch truncated, o/w no proximal IC stenosis  Carotid Doppler  1- 39 percent stenosis involving the right internal carotid artery and the left internal carotid artery.  2D Echo  Left ventricle: The cavity size was normal. Systolic function was moderately to severely reduced. The estimated ejection fraction was in the range of 30% to 35%. Moderate diffuse hypokinesis. Akinesis of the anteroseptal,  anterior, and apical myocardium    SCDs for VTE prophylaxis  Loop recorder placed for cryptogenic stroke 09/2013. Loop interrogated in ED. Per Dr. Rayann Heman, PACS and aberrance but no atrial fibrillation detected.   UDS results - positive for opiates and cocaine    heart healthy/carb modified clear liquids.   Bedrest  Resultant expressive aphasia  Therapy recommendations:  SNF recommended  Ongoing aggressive risk factor management  Disposition:  SNF.    Hypertension   Permissive hypertension <220/120 for 24-48 hours and then gradually normalize within 5-7 days  BP goal long term normotensive  Home meds:  Coreg and Imdur resumed in hospital. Was also on lisinopril, not resumed due to stroke BP 100-135/69-79 past 24h (06/01/2014 @ 8:40 AM) Stable  Hyperlipidemia  Home meds:  crestor 40. Not resumed in hospital  Allergic to atorvastatin (affects her memory)  LDL 126   At LDL goal < 70 for diabetics  Resume statin  at discharge  Diabetes  HgbA1c 5.5   Controlled  At Goal < 7.0  Other Stroke Risk Factors Former cigarette smoker ETOH use   Morbid obesity, Body mass index is 42.06 kg/(m^2).    Hx stroke - R insular ischemic CVA ; in Januray  3582 and prior embolic stroke in April 5189   Family hx stroke (maternal aunt)   Coronary artery disease Obstructive sleep apnea, on CPAP at home HIV CHF. EF 25% 09/2013.euvolemic.  Other Active Problems  COPD  Anemia  Hospital day # 3  Mikey Bussing [facility] Pager [phone] 06/01/2014, 8:40 AM   The patient was seen and examined by me; notes, chart and tests reviewed and discussed with midlevel provider, other providers, and patient. Discuss  Cocaine use and risk of stroke.  Will sign off. F/U w Stroke Clinc 1 month.    To contact Stroke Continuity provider, please refer to [email or web address] After hours, contact General Neurology

## 2014-06-01 NOTE — Clinical Social Work Placement (Addendum)
Clinical Social Work Department CLINICAL SOCIAL WORK PLACEMENT NOTE 06/01/2014  Patient:  [name], [name]  Account Number:  [id number] [place] date:  05/29/2014  Clinical Social Worker:  Carrington Clamp, LCSWA  Date/time:  06/01/2014 06:56 PM  Clinical Social Work is seeking post-discharge placement for this patient at the following level of care:   [place]   (*CSW will update this form in Epic as items are completed)   06/01/2014  Patient/family provided with [place] Department of Clinical Social Work's list of facilities offering this level of care within the geographic area requested by the patient (or if unable, by the patient's family).  06/01/2014  Patient/family informed of their freedom to choose among providers that offer the needed level of care, that participate in Medicare, Medicaid or managed care program needed by the patient, have an available bed and are willing to accept the patient.  06/01/2014  Patient/family informed of [facility]' ownership interest in [facility], as well as of the fact that they are under no obligation to receive care at this facility.  PASARR submitted to EDS on Existing PASARR number received on Existing  FL2 transmitted to all facilities in geographic area requested by pt/family on  06/01/2014 FL2 transmitted to all facilities within larger geographic area on 06/02/2014  Patient informed that his/her managed care company has contracts with or will negotiate with  certain facilities, including the following:     Patient/family informed of bed offers received:  06/01/2014 Patient chooses bed at [facility] Physician recommends and patient chooses bed at    Patient to be transferred to [facility] on 06/02/2014   Patient to be transferred to facility by [facility] Patient and family notified of transfer on 06/02/2014 Name of family member notified:    The following physician  request were entered in Epic:   Additional Comments:  [place], Connell Weekend Clinical Social Worker [phone]

## 2014-06-01 NOTE — Progress Notes (Addendum)
Subjective: Pt denies complaints today.  She is agreeable to SNF and likes [facility] on [place]   Objective: Vital signs in last 24 hours: Filed Vitals:   05/31/14 2300 06/01/14 0400 06/01/14 0717 06/01/14 0759  BP: 139/79 114/68  123/61  Pulse: 85 86  79  Temp: 98.3 F (36.8 C) 98.4 F (36.9 C)  99.2 F (37.3 C)  TempSrc: Oral Oral  Oral  Resp: 18 18  18   Height:      Weight:      SpO2: 100% 96% 96% 95%   Weight change:  No intake or output data in the 24 hours ending 06/01/14 0957 Vitals reviewed. General: resting in bed, NAD HEENT: Leon/at,  no scleral icterus Cardiac: RRR, no rubs, murmurs or gallops Pulm: clear to auscultation bilaterally, no wheezes, rales, or rhonchi Abd: soft, nontender, nondistended, BS present Ext: warm and well perfused, no pedal edema Neuro: alert and oriented X3, cranial nerves II-XII grossly intact, mild expressive aphasia.  5/5 strength all 4 ext and sensation to light touch decreased in V2 on face.  BL gait walks with cane    Lab Results: Basic Metabolic Panel:  Recent Labs Lab 05/29/14 1929 05/31/14 1313 06/01/14 0530  NA 139 139 141  K 2.9* 3.6* 3.8  CL 103 102 103  CO2 24 28 28   GLUCOSE 125* 86 89  BUN 9 12 9   CREATININE 0.77 0.90 0.85  CALCIUM 9.0 9.0 8.8  MG 1.9  --   --    Liver Function Tests:  Recent Labs Lab 05/29/14 1929  AST 19  ALT 9  ALKPHOS 59  BILITOT 0.6  PROT 8.1  ALBUMIN 3.5  CBC:  Recent Labs Lab 05/29/14 1929 06/01/14 0530  WBC 4.0 4.1  NEUTROABS 2.0  --   HGB 12.6 10.8*  HCT 39.9 35.3*  MCV 78.1 82.1  PLT 215 184   Cardiac Enzymes:  Recent Labs Lab 05/30/14 0415 05/30/14 1018 05/30/14 1925  TROPONINI <0.30 <0.30 <0.30   CBG:  Recent Labs Lab 05/30/14 1319  GLUCAP 99   Hemoglobin A1C:  Recent Labs Lab 05/30/14 0117  HGBA1C 5.5   Fasting Lipid Panel:  Recent Labs Lab 05/30/14 0415  CHOL 186  HDL 43  LDLCALC 126*  TRIG 86  CHOLHDL 4.3    Coagulation:  Recent Labs Lab 05/30/14 0117  LABPROT 13.9  INR 1.07   Urine Drug Screen: Drugs of Abuse     Component Value Date/Time   LABOPIA POSITIVE* 05/30/2014 2340   LABOPIA PPS 05/16/2014 1215   COCAINSCRNUR POSITIVE* 05/30/2014 2340   COCAINSCRNUR PPS 05/16/2014 1215   LABBENZ NONE DETECTED 05/30/2014 2340   LABBENZ NEG 05/16/2014 1215   LABBENZ NEGATIVE 05/23/2009 0542   AMPHETMU NONE DETECTED 05/30/2014 2340   AMPHETMU NEGATIVE 05/23/2009 0542   THCU NONE DETECTED 05/30/2014 2340   LABBARB NONE DETECTED 05/30/2014 2340   LABBARB NEG 05/16/2014 1215    Urinalysis:  Recent Labs Lab 05/30/14 0103  COLORURINE YELLOW  LABSPEC 1.018  PHURINE 6.0  GLUCOSEU NEGATIVE  HGBUR LARGE*  BILIRUBINUR NEGATIVE  KETONESUR NEGATIVE  PROTEINUR NEGATIVE  UROBILINOGEN 1.0  NITRITE NEGATIVE  LEUKOCYTESUR NEGATIVE   Misc. Labs: none  Studies/Results: Ct Chest W Contrast  05/31/2014   CLINICAL DATA:  Hypercoagulable, cancer screening, history HIV, CHF, MI, COPD  EXAM: CT CHEST, ABDOMEN, AND PELVIS WITH CONTRAST  TECHNIQUE: Multidetector CT imaging of the chest, abdomen and pelvis was performed following the standard protocol during bolus  administration of intravenous contrast.  CONTRAST:  162mL OMNIPAQUE IOHEXOL 300 MG/ML  SOLN  COMPARISON:  CT chest 07/14/2013, CT abdomen and pelvis 03/11/2012  FINDINGS: CT CHEST FINDINGS  Multiple normal sized axillary lymph nodes bilaterally.  No mediastinal or hilar adenopathy.  Coronary arterial stent noted.  Dilatation of LEFT ventricle.  Minimal atelectasis at LEFT lower lobe.  No pulmonary infiltrate, pleural effusion, or mass/nodule.  No acute osseous findings.  CT ABDOMEN AND PELVIS FINDINGS  Gallbladder surgically absent.  Liver, spleen, pancreas, kidneys, and adrenal glands normal appearance.  Normal appendix.  Normal appearing bladder, ureters, uterus and adnexae.  Stomach and bowel loops grossly normal appearance.  No mass, adenopathy, free  fluid or inflammatory process.  Normal size inguinal lymph nodes bilaterally.  No acute osseous findings.  IMPRESSION: Enlargement of LEFT ventricle.  Post cholecystectomy.  Otherwise negative CT chest, abdomen and pelvis.   Electronically Signed   By: Lavonia Dana M.D.   On: 05/31/2014 17:29   Mr [name] Head Wo Contrast  05/30/2014   CLINICAL DATA:  Difficulty with slurred speech beginning several days ago. Hypertension. HIV. History of prior strokes. Confusion.  EXAM: MRI HEAD WITHOUT CONTRAST  MRA HEAD WITHOUT CONTRAST  TECHNIQUE: Multiplanar, multiecho pulse sequences of the brain and surrounding structures were obtained without intravenous contrast. Angiographic images of the head were obtained using MRA technique without contrast.  COMPARISON:  None.  CT head earlier today.  MR head 10/02/2013.  FINDINGS: MRI HEAD FINDINGS  There is a moderately large area of restricted diffusion consistent with acute infarction affecting the LEFT frontal cortex extending to the operculum, as well as extending into the deep white matter. Some central areas of normalized diffusion could reflect an early subacute time course. There is minor involvement of the LEFT insula/ LEFT external capsule. There is no midline shift. No acute or subacute hemorrhage is evident.  Areas of chronic cerebral and cerebellar infarction as outlined on previous MR from January. These involve most notably the RIGHT superior cerebellum and RIGHT posterior frontal subcortical white matter. Mild subcortical and periventricular T2 and FLAIR hyperintensities, likely chronic microvascular ischemic change. No foci of chronic hemorrhage. Flow voids are maintained. No midline abnormality. Cervical spondylosis, incompletely evaluated. Visualized paranasal sinuses, and mastoids appear unremarkable. Previous RIGHT ocular surgery. No osseous findings.  MRA HEAD FINDINGS  Motion degraded exam.  The internal carotid arteries are widely patent. The basilar artery is  widely patent with vertebrals codominant. There is no proximal intracranial stenosis or aneurysm.  There is no evidence for a proximal LEFT MCA lesion. Normal LEFT MCA bifurcation. There appears to be premature truncation of a LEFT M3 branch subserving the area of the LEFT frontal lobe, which could be embolic or thrombotic in nature.  IMPRESSION: Moderately large area of acute to early subacute infarction without hemorrhage, affecting the LEFT frontal cortex, subcortical white matter, with minor involvement of LEFT insula. No midline shift.  No proximal intracranial stenosis on MRA. It is possible that a LEFT MCA M3 branch subserving the area of infarction is prematurely truncated.   Electronically Signed   By: Rolla Flatten M.D.   On: 05/30/2014 12:20   Mr Brain Wo Contrast  05/30/2014   CLINICAL DATA:  Difficulty with slurred speech beginning several days ago. Hypertension. HIV. History of prior strokes. Confusion.  EXAM: MRI HEAD WITHOUT CONTRAST  MRA HEAD WITHOUT CONTRAST  TECHNIQUE: Multiplanar, multiecho pulse sequences of the brain and surrounding structures were obtained without intravenous contrast. Angiographic images  of the head were obtained using MRA technique without contrast.  COMPARISON:  None.  CT head earlier today.  MR head 10/02/2013.  FINDINGS: MRI HEAD FINDINGS  There is a moderately large area of restricted diffusion consistent with acute infarction affecting the LEFT frontal cortex extending to the operculum, as well as extending into the deep white matter. Some central areas of normalized diffusion could reflect an early subacute time course. There is minor involvement of the LEFT insula/ LEFT external capsule. There is no midline shift. No acute or subacute hemorrhage is evident.  Areas of chronic cerebral and cerebellar infarction as outlined on previous MR from January. These involve most notably the RIGHT superior cerebellum and RIGHT posterior frontal subcortical white matter. Mild  subcortical and periventricular T2 and FLAIR hyperintensities, likely chronic microvascular ischemic change. No foci of chronic hemorrhage. Flow voids are maintained. No midline abnormality. Cervical spondylosis, incompletely evaluated. Visualized paranasal sinuses, and mastoids appear unremarkable. Previous RIGHT ocular surgery. No osseous findings.  MRA HEAD FINDINGS  Motion degraded exam.  The internal carotid arteries are widely patent. The basilar artery is widely patent with vertebrals codominant. There is no proximal intracranial stenosis or aneurysm.  There is no evidence for a proximal LEFT MCA lesion. Normal LEFT MCA bifurcation. There appears to be premature truncation of a LEFT M3 branch subserving the area of the LEFT frontal lobe, which could be embolic or thrombotic in nature.  IMPRESSION: Moderately large area of acute to early subacute infarction without hemorrhage, affecting the LEFT frontal cortex, subcortical white matter, with minor involvement of LEFT insula. No midline shift.  No proximal intracranial stenosis on MRA. It is possible that a LEFT MCA M3 branch subserving the area of infarction is prematurely truncated.   Electronically Signed   By: Rolla Flatten M.D.   On: 05/30/2014 12:20   Ct Abdomen Pelvis W Contrast  05/31/2014   CLINICAL DATA:  Hypercoagulable, cancer screening, history HIV, CHF, MI, COPD  EXAM: CT CHEST, ABDOMEN, AND PELVIS WITH CONTRAST  TECHNIQUE: Multidetector CT imaging of the chest, abdomen and pelvis was performed following the standard protocol during bolus administration of intravenous contrast.  CONTRAST:  164mL OMNIPAQUE IOHEXOL 300 MG/ML  SOLN  COMPARISON:  CT chest 07/14/2013, CT abdomen and pelvis 03/11/2012  FINDINGS: CT CHEST FINDINGS  Multiple normal sized axillary lymph nodes bilaterally.  No mediastinal or hilar adenopathy.  Coronary arterial stent noted.  Dilatation of LEFT ventricle.  Minimal atelectasis at LEFT lower lobe.  No pulmonary infiltrate,  pleural effusion, or mass/nodule.  No acute osseous findings.  CT ABDOMEN AND PELVIS FINDINGS  Gallbladder surgically absent.  Liver, spleen, pancreas, kidneys, and adrenal glands normal appearance.  Normal appendix.  Normal appearing bladder, ureters, uterus and adnexae.  Stomach and bowel loops grossly normal appearance.  No mass, adenopathy, free fluid or inflammatory process.  Normal size inguinal lymph nodes bilaterally.  No acute osseous findings.  IMPRESSION: Enlargement of LEFT ventricle.  Post cholecystectomy.  Otherwise negative CT chest, abdomen and pelvis.   Electronically Signed   By: Lavonia Dana M.D.   On: 05/31/2014 17:29   Medications:  Scheduled Meds: . clopidogrel  75 mg Oral Daily  . elvitegravir-cobicistat-emtricitabine-tenofovir  1 tablet Oral Q breakfast  . ferrous sulfate  325 mg Oral Q breakfast  . mometasone-formoterol  2 puff Inhalation BID  . morphine  30 mg Oral TID  . multivitamin with minerals  1 tablet Oral Daily  . rosuvastatin  40 mg Oral q1800  .  traZODone  50 mg Oral QHS   Continuous Infusions:  PRN Meds:.albuterol, diphenoxylate-atropine, lubiprostone, nitroGLYCERIN, ondansetron (ZOFRAN) IV, oxyCODONE, oxyCODONE-acetaminophen, senna-docusate, zolpidem Assessment/Plan: 53 y.o presented with difficulty with speech found to have left frontal cortex, subcortical white matter, with minor involvement of LEFT insula infarct   #left frontal cortex, subcortical white matter, with minor involvement of LEFT insula infarct  -cocaine UDS will get social work. Encourage cessation -Neurology following will need outpatient f/u at d/c. Consider RESPECT ESUS trial per Neuro -[place] neg, w/u for malignancy with CT ab/pelvis, chest negative, no atrial fibrillation on previous loop recorder.   -We continued Plavix and Aspirin therapy (spoke with Dr. Merlene Laughter on day of discharge to confirm dual antiplatelet use) but will only treat for 3 months due to increased risk of intracranial  hemorrahge with dual therapy.  After 3 months Aspirin needs to be discontinued. -will d/c to SNF  #HYPERTENSION -controlled off, Imdur 60, Coreg 3.125 mg bid and Lisinopril 20 mg qd-will need to add back for CHF by discharge.   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 repeat echo with EF 30-35% with moderate diffuse HK, RV mod dilated  -will resume Imdur, ACEI, BB at discharge  -prn NTG  #HYPERLIPIDEMIA Lipid Panel     Component Value Date/Time   CHOL 186 05/30/2014 0415   TRIG 86 05/30/2014 0415   HDL 43 05/30/2014 0415   CHOLHDL 4.3 05/30/2014 0415   VLDL 17 05/30/2014 0415   LDLCALC 126* 05/30/2014 0415   -continue Lipitor   #COPD (chronic obstructive pulmonary disease) -prn Albuterol neb, dulera  -smoking cessation   #HUMAN IMMUNODEFICIENCY VIRUS [HIV] -cbd 420 and VL undetectable 01/2014  -will need to f/u with ID Continue Stribild   #Insominia -Trazadone, Ambien  #chronic low back pain -currently getting Percocet and MS contin -[facility] will no longer prescribe to pt -SNF to reassess pain and give medications as needed   #constipation ? IBS -Amitiza prn qhs, Senna   #F/E/N -NSL -HH carb mod diet   #DVT px  -scds    Dispo: will try to discharge to SNF today.  Disposition is deferred at this time, awaiting improvement of current medical problems.  Anticipated discharge in approximately 0-1 day(s).   The patient does have a current PCP Bartholomew Crews, MD) and does need an [facility] hospital follow-up appointment after discharge.  The patient does have transportation limitations that hinder transportation to clinic appointments.  .Services Needed at time of discharge: Y = Yes, Blank = No PT: SNF  OT: SNF  RN:   Equipment:   Other:     LOS: 3 days   Cresenciano Genre, MD [phone] 06/01/2014, 9:57 AM

## 2014-06-01 NOTE — Clinical Social Work Note (Signed)
CSW continues to follow for d/c planning needs. CSW made aware patient ready for d/c by MD. Per MD, patient prefers [facility] as she has been there before. CSW met with patient. Per patient, she is agreeable to d/c to [facility]. CSW contacted [facility] and confirmed bed availability with Dorian [facility]. CSW and Dorian Pod discussed 3 night qualifying stay for insurance to cover placement. CSW made MD Aundra Dubin) and RN aware. CSW spoke with patient and made her aware. Per patient she will make her children aware. CSW prepared d/c packet and placed in patient's shadow chart. CSW to follow-up with [facility]ian [facility]) tomorrow with d/c. Complete assessment to follow.  [place], [place] Weekend Clinical Social Worker [phone]

## 2014-06-01 NOTE — Clinical Social Work Psychosocial (Signed)
Clinical Social Work Department BRIEF PSYCHOSOCIAL ASSESSMENT 06/01/2014  Patient:  [name], [name]     Account Number:  [id number]     Admit date:  05/29/2014  Clinical Social Worker:  Hubert Azure  Date/Time:  06/01/2014 06:58 PM  Referred by:  Physician  Date Referred:  06/01/2014 Referred for  SNF Placement   Other Referral:   Interview type:  Patient Other interview type:    PSYCHOSOCIAL DATA Living Status:  WITH ADULT CHILDREN Admitted from facility:   Level of care:   Primary support name:  [name] [phone]) Primary support relationship to patient:  CHILD, ADULT Degree of support available:   Good    CURRENT CONCERNS Current Concerns  Post-Acute Placement   Other Concerns:    SOCIAL WORK ASSESSMENT / PLAN CSW met with patient who was alert and oriented x4. CSW introduced self and explained role. CSW explained SNF placement process and discussed d/c plan. Per patient, she lives with her adult children, but she is basically "by myself" all the time. Per patient, she uses a cane and walker at home. 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 Patient expressed concern with returning home.   Assessment/plan status:  Other - See comment Other assessment/ plan:   CSW to complete FL2 and submit PASARR for placement.   Information/referral to community resources:    PATIENT'S/FAMILY'S RESPONSE TO PLAN OF CARE: Patient was eager to d/c to a SNF. Patient expressed fear in returning home because without any support, but was optimistic about SNF placement. CSW provided appropriate emotional support.    [place], [place] Weekend Clinical Social Worker [phone]

## 2014-06-01 NOTE — Discharge Summary (Signed)
Name: [name] MRN: [id number] DOB: [birth year] 53 y.o. PCP: Bartholomew Crews, MD  Date of Admission: 05/29/2014 10:36 PM Date of Discharge: 06/02/2014 Attending Physician: Sid Falcon, MD  Discharge Diagnosis: 1. Left frontal cortex, subcortical white matter, with minor involvement of LEFT insula infarct  2. HYPERTENSION  3. CAD/Chronic combined systolic and diastolic CHF (congestive heart failure)  4. HYPERLIPIDEMIA  6. COPD (chronic obstructive pulmonary disease)  7. HUMAN IMMUNODEFICIENCY VIRUS [HIV]  8. Insominia  9. Chronic low back pain  10. Constipation ? IBS  11. Hypokalemia, resolved  12. Cocaine + UDS  Discharge Medications:   Medication List         albuterol 108 (90 BASE) MCG/ACT inhaler  Commonly known as:  PROVENTIL HFA;VENTOLIN HFA  Inhale 2 puffs into the lungs every 6 (six) hours as needed for wheezing or shortness of breath.     albuterol (2.5 MG/3ML) 0.083% nebulizer solution  Commonly known as:  PROVENTIL  Take 3 mLs (2.5 mg total) by nebulization every 6 (six) hours as needed for wheezing or shortness of breath. Use MDI first. Use neb if unable to use MDI.     aspirin 81 MG EC tablet  Take 1 tablet (81 mg total) by mouth daily.     beclomethasone 80 MCG/ACT inhaler  Commonly known as:  QVAR  Inhale 2 puffs into the lungs 2 (two) times daily.     carvedilol 3.125 MG tablet  Commonly known as:  COREG  Take 1 tablet (3.125 mg total) by mouth 2 (two) times daily with a meal. 9am and 6pm     clopidogrel 75 MG tablet  Commonly known as:  PLAVIX  Take 75 mg by mouth daily.     diphenoxylate-atropine 2.5-0.025 MG per tablet  Commonly known as:  LOMOTIL  Take 1 tablet by mouth 4 (four) times daily as needed for diarrhea or loose stools.     elvitegravir-cobicistat-emtricitabine-tenofovir 150-150-200-300 MG Tabs tablet  Commonly known as:  STRIBILD  Take 1 tablet by mouth daily with breakfast.     ferrous sulfate 325 (65 FE) MG tablet   Take 1 tablet (325 mg total) by mouth daily with breakfast.     HYDROcodone-acetaminophen 5-325 MG per tablet  Commonly known as:  NORCO/VICODIN  Take 1 tablet by mouth every 6 (six) hours as needed for moderate pain or severe pain.     isosorbide mononitrate 60 MG 24 hr tablet  Commonly known as:  IMDUR  Take 1 tablet (60 mg total) by mouth every evening. 5pm     lisinopril 20 MG tablet  Commonly known as:  PRINIVIL,ZESTRIL  Take 1 tablet (20 mg total) by mouth daily.     lubiprostone 24 MCG capsule  Commonly known as:  AMITIZA  Take 24 mcg by mouth at bedtime as needed for constipation.     morphine 30 MG 12 hr tablet  Commonly known as:  MS CONTIN  Take 1 tablet (30 mg total) by mouth 3 (three) times daily.     multivitamin with minerals Tabs tablet  Take 1 tablet by mouth daily.     naloxone 1 MG/ML injection  Commonly known as:  NARCAN  Place 1 mL (1 mg total) into the nose as needed (For overdose). Spray 1 mL (1 mg) into each nostril. If no response within 2 to 5 minutes, repeat dose     nitroGLYCERIN 0.4 MG SL tablet  Commonly known as:  NITROSTAT  Place 0.4 mg under  the tongue every 5 (five) minutes as needed for chest pain.     oxyCODONE-acetaminophen 10-325 MG per tablet  Commonly known as:  PERCOCET  Take 1 tablet by mouth every 4 (four) hours as needed for pain.     rosuvastatin 40 MG tablet  Commonly known as:  CRESTOR  Take 1 tablet (40 mg total) by mouth daily at 6 PM.     traZODone 50 MG tablet  Commonly known as:  DESYREL  Take 50 mg by mouth at bedtime.     valACYclovir 500 MG tablet  Commonly known as:  VALTREX  Take 500 mg by mouth daily as needed (for herpes breakouts).     zolpidem 5 MG tablet  Commonly known as:  AMBIEN  Take 1 tablet (5 mg total) by mouth at bedtime as needed for sleep.        Disposition and follow-up:   [name] was discharged from [facility] in stable condition.  At the hospital follow  up visit please address:  1.   -Internal Medicine Clinic will no longer prescribe narcotics skilled nursing facility should address pain -Ensure follow up with Neurology [facility]) in 2 months call for appointment  -Resume blood pressure medications on 06/02/14  -will need CPAP for use in SNF -will need follow up appointment with Infectious Disease in the future  -check BMET in 1 week.   -Ensure Aspirin is discontinued after 3 months  2.  Labs / imaging needed at time of follow-up:  -none  3.  Pending labs/ test needing follow-up:  -none  Follow-up Appointments: -Please make follow up appt with Internal Medicine Clinic at [facility] at discharge [phone] -Patient will need appt for [facility] in 2 months.   Follow-up Information   Follow up with Larey Dresser, MD. (call when discharged from SNF for an appt [phone])    Specialty:  Internal Medicine   Contact information:   [place] [address] [phone]       Please follow up. (please call for appt for follow up in 2 months )    Contact information:   [facility]  [address]  [place] [address]  [phone]      Discharge Instructions: Discharge Instructions   Diet - low sodium heart healthy    Complete by:  As directed      Discharge instructions    Complete by:  As directed   When discharged from SNF call Internal Medicine Clinic for follow up [phone] Call [facility] for an appt in 2 months for stroke     Increase activity slowly    Complete by:  As directed            Consultations: -Neurology-Dr. Aram Beecham, Dr. Leonie Man  Procedures Performed:  Dg Chest 2 View  05/30/2014   CLINICAL DATA:  Stroke.  EXAM: CHEST  2 VIEW  COMPARISON:  10/02/2013  FINDINGS: There is mild cardiomegaly. Negative upper mediastinal contours. Implantable loop recorder within the left chest wall. There is no edema, consolidation, effusion, or pneumothorax.  IMPRESSION:  Cardiomegaly without edema.   Electronically Signed   By: [name] M.D.   On: 05/30/2014 04:39   Ct Head Wo Contrast  05/30/2014   CLINICAL DATA:  Intermittent confusion and disorientation.  EXAM: CT HEAD WITHOUT CONTRAST  TECHNIQUE: Contiguous axial images were obtained from the base of the skull through the vertex without intravenous contrast.  COMPARISON:  10/02/2013.  FINDINGS: Skull and Sinuses:Negative for fracture or destructive process. The mastoids, middle ears, and imaged paranasal sinuses are clear.  Orbits: Status post right scleral banding and cataract resection. No acute orbital findings.  Brain: There is new was small area of cortical and subcortical low-density in the left frontal lobe anteriorly. New deep white matter low-attenuation in the same region.  Remote right superior cerebellar infarct and right temporal lobe infarct along the sylvian fissure. Remote small vessel ischemic change in the right centrum semiovale.  No evidence of hydrocephalus, mass lesion, or shift.  IMPRESSION: 1. New small infarct in the left frontal lobe (MCA territory). MRI could confirm and determine acuity. 2. Remote right cerebellar and right cerebral infarcts.   Electronically Signed   By: [name] M.D.   On: 05/30/2014 01:08   Ct Chest W Contrast  05/31/2014   CLINICAL DATA:  Hypercoagulable, cancer screening, history HIV, CHF, MI, COPD  EXAM: CT CHEST, ABDOMEN, AND PELVIS WITH CONTRAST  TECHNIQUE: Multidetector CT imaging of the chest, abdomen and pelvis was performed following the standard protocol during bolus administration of intravenous contrast.  CONTRAST:  142mL OMNIPAQUE IOHEXOL 300 MG/ML  SOLN  COMPARISON:  CT chest 07/14/2013, CT abdomen and pelvis 03/11/2012  FINDINGS: CT CHEST FINDINGS  Multiple normal sized axillary lymph nodes bilaterally.  No mediastinal or hilar adenopathy.  Coronary arterial stent noted.  Dilatation of LEFT ventricle.  Minimal atelectasis at LEFT lower lobe.  No  pulmonary infiltrate, pleural effusion, or mass/nodule.  No acute osseous findings.  CT ABDOMEN AND PELVIS FINDINGS  Gallbladder surgically absent.  Liver, spleen, pancreas, kidneys, and adrenal glands normal appearance.  Normal appendix.  Normal appearing bladder, ureters, uterus and adnexae.  Stomach and bowel loops grossly normal appearance.  No mass, adenopathy, free fluid or inflammatory process.  Normal size inguinal lymph nodes bilaterally.  No acute osseous findings.  IMPRESSION: Enlargement of LEFT ventricle.  Post cholecystectomy.  Otherwise negative CT chest, abdomen and pelvis.   Electronically Signed   By: Lavonia Dana M.D.   On: 05/31/2014 17:29   Mr [name] Head Wo Contrast  05/30/2014   CLINICAL DATA:  Difficulty with slurred speech beginning several days ago. Hypertension. HIV. History of prior strokes. Confusion.  EXAM: MRI HEAD WITHOUT CONTRAST  MRA HEAD WITHOUT CONTRAST  TECHNIQUE: Multiplanar, multiecho pulse sequences of the brain and surrounding structures were obtained without intravenous contrast. Angiographic images of the head were obtained using MRA technique without contrast.  COMPARISON:  None.  CT head earlier today.  MR head 10/02/2013.  FINDINGS: MRI HEAD FINDINGS  There is a moderately large area of restricted diffusion consistent with acute infarction affecting the LEFT frontal cortex extending to the operculum, as well as extending into the deep white matter. Some central areas of normalized diffusion could reflect an early subacute time course. There is minor involvement of the LEFT insula/ LEFT external capsule. There is no midline shift. No acute or subacute hemorrhage is evident.  Areas of chronic cerebral and cerebellar infarction as outlined on previous MR from January. These involve most notably the RIGHT superior cerebellum and RIGHT posterior frontal subcortical white matter. Mild subcortical and periventricular T2 and FLAIR hyperintensities, likely chronic microvascular  ischemic change. No foci of chronic hemorrhage. Flow voids are maintained. No midline abnormality. Cervical spondylosis, incompletely evaluated. Visualized paranasal sinuses, and mastoids appear unremarkable. Previous RIGHT ocular surgery. No osseous findings.  MRA HEAD FINDINGS  Motion degraded exam.  The internal carotid arteries are widely patent.  The basilar artery is widely patent with vertebrals codominant. There is no proximal intracranial stenosis or aneurysm.  There is no evidence for a proximal LEFT MCA lesion. Normal LEFT MCA bifurcation. There appears to be premature truncation of a LEFT M3 branch subserving the area of the LEFT frontal lobe, which could be embolic or thrombotic in nature.  IMPRESSION: Moderately large area of acute to early subacute infarction without hemorrhage, affecting the LEFT frontal cortex, subcortical white matter, with minor involvement of LEFT insula. No midline shift.  No proximal intracranial stenosis on MRA. It is possible that a LEFT MCA M3 branch subserving the area of infarction is prematurely truncated.   Electronically Signed   By: Rolla Flatten M.D.   On: 05/30/2014 12:20   Mr Brain Wo Contrast  05/30/2014   CLINICAL DATA:  Difficulty with slurred speech beginning several days ago. Hypertension. HIV. History of prior strokes. Confusion.  EXAM: MRI HEAD WITHOUT CONTRAST  MRA HEAD WITHOUT CONTRAST  TECHNIQUE: Multiplanar, multiecho pulse sequences of the brain and surrounding structures were obtained without intravenous contrast. Angiographic images of the head were obtained using MRA technique without contrast.  COMPARISON:  None.  CT head earlier today.  MR head 10/02/2013.  FINDINGS: MRI HEAD FINDINGS  There is a moderately large area of restricted diffusion consistent with acute infarction affecting the LEFT frontal cortex extending to the operculum, as well as extending into the deep white matter. Some central areas of normalized diffusion could reflect an early  subacute time course. There is minor involvement of the LEFT insula/ LEFT external capsule. There is no midline shift. No acute or subacute hemorrhage is evident.  Areas of chronic cerebral and cerebellar infarction as outlined on previous MR from January. These involve most notably the RIGHT superior cerebellum and RIGHT posterior frontal subcortical white matter. Mild subcortical and periventricular T2 and FLAIR hyperintensities, likely chronic microvascular ischemic change. No foci of chronic hemorrhage. Flow voids are maintained. No midline abnormality. Cervical spondylosis, incompletely evaluated. Visualized paranasal sinuses, and mastoids appear unremarkable. Previous RIGHT ocular surgery. No osseous findings.  MRA HEAD FINDINGS  Motion degraded exam.  The internal carotid arteries are widely patent. The basilar artery is widely patent with vertebrals codominant. There is no proximal intracranial stenosis or aneurysm.  There is no evidence for a proximal LEFT MCA lesion. Normal LEFT MCA bifurcation. There appears to be premature truncation of a LEFT M3 branch subserving the area of the LEFT frontal lobe, which could be embolic or thrombotic in nature.  IMPRESSION: Moderately large area of acute to early subacute infarction without hemorrhage, affecting the LEFT frontal cortex, subcortical white matter, with minor involvement of LEFT insula. No midline shift.  No proximal intracranial stenosis on MRA. It is possible that a LEFT MCA M3 branch subserving the area of infarction is prematurely truncated.   Electronically Signed   By: Rolla Flatten M.D.   On: 05/30/2014 12:20   Ct Abdomen Pelvis W Contrast  05/31/2014   CLINICAL DATA:  Hypercoagulable, cancer screening, history HIV, CHF, MI, COPD  EXAM: CT CHEST, ABDOMEN, AND PELVIS WITH CONTRAST  TECHNIQUE: Multidetector CT imaging of the chest, abdomen and pelvis was performed following the standard protocol during bolus administration of intravenous contrast.   CONTRAST:  132mL OMNIPAQUE IOHEXOL 300 MG/ML  SOLN  COMPARISON:  CT chest 07/14/2013, CT abdomen and pelvis 03/11/2012  FINDINGS: CT CHEST FINDINGS  Multiple normal sized axillary lymph nodes bilaterally.  No mediastinal or hilar adenopathy.  Coronary arterial  stent noted.  Dilatation of LEFT ventricle.  Minimal atelectasis at LEFT lower lobe.  No pulmonary infiltrate, pleural effusion, or mass/nodule.  No acute osseous findings.  CT ABDOMEN AND PELVIS FINDINGS  Gallbladder surgically absent.  Liver, spleen, pancreas, kidneys, and adrenal glands normal appearance.  Normal appendix.  Normal appearing bladder, ureters, uterus and adnexae.  Stomach and bowel loops grossly normal appearance.  No mass, adenopathy, free fluid or inflammatory process.  Normal size inguinal lymph nodes bilaterally.  No acute osseous findings.  IMPRESSION: Enlargement of LEFT ventricle.  Post cholecystectomy.  Otherwise negative CT chest, abdomen and pelvis.   Electronically Signed   By: Lavonia Dana M.D.   On: 05/31/2014 17:29    2D Echo:  05/30/14 echo LV EF: 30% - 35%  ------------------------------------------------------------------- Indications: CVA 436.  ------------------------------------------------------------------- History: PMH: HIV. Morbid obesity. PFO. Coronary artery disease. Congestive heart failure. Chronic obstructive pulmonary disease. Risk factors: Hypertension. Dyslipidemia.  ------------------------------------------------------------------- Study Conclusions  - Left ventricle: The cavity size was normal. Systolic function was moderately to severely reduced. The estimated ejection fraction was in the range of 30% to 35%. Moderate diffuse hypokinesis. Akinesis of the anteroseptal, anterior, and apical myocardium. - Left atrium: The atrium was moderately dilated. - Right ventricle: The cavity size was mildly dilated. Wall thickness was normal.  Transthoracic echocardiography. M-mode, complete 2D,  spectral Doppler, and color Doppler. Birthdate: Patient birthdate: [birth year]. Age: Patient is 53 yr old. Sex: Gender: female. BMI: 42.1 kg/m^2. Blood pressure: 135/79 Patient status: Inpatient. Study date: Study date: 05/30/2014. Study time: 04:09 PM. Location: Bedside.  -------------------------------------------------------------------  ------------------------------------------------------------------- Left ventricle: The cavity size was normal. Systolic function was moderately to severely reduced. The estimated ejection fraction was in the range of 30% to 35%. Moderate diffuse hypokinesis. Regional wall motion abnormalities: Akinesis of the anteroseptal, anterior, and apical myocardium.  ------------------------------------------------------------------- Aortic valve: Trileaflet; normal thickness leaflets. Mobility was not restricted. Doppler: Transvalvular velocity was within the normal range. There was no stenosis. There was no regurgitation.  ------------------------------------------------------------------- Aorta: Aortic root: The aortic root was normal in size.  ------------------------------------------------------------------- Mitral valve: Structurally normal valve. Mobility was not restricted. Doppler: Transvalvular velocity was within the normal range. There was no evidence for stenosis. There was trivial regurgitation. Peak gradient (D): 5 mm Hg.  ------------------------------------------------------------------- Left atrium: The atrium was moderately dilated.  ------------------------------------------------------------------- Right ventricle: The cavity size was mildly dilated. Wall thickness was normal. Systolic function was normal.  ------------------------------------------------------------------- Pulmonic valve: Not well visualized. The valve appears to be grossly normal. Doppler: Transvalvular velocity was within the normal range. There was no  evidence for stenosis.  ------------------------------------------------------------------- Tricuspid valve: Structurally normal valve. Doppler: Transvalvular velocity was within the normal range. There was no regurgitation.  ------------------------------------------------------------------- Right atrium: The atrium was normal in size.  ------------------------------------------------------------------- Pericardium: There was no pericardial effusion.  ------------------------------------------------------------------- Systemic veins: Inferior vena cava: The vessel was normal in size.       Admission HPI:  Chief Complaint: difficult speech  HPI: [name] is a 53 y.o. with past medical history of hypertension, hyperlipidemia, ischemic stroke in 2013 without residual deficits, COPD, HIV (last CD4 75 and <20 VL on 01/16/14), CAD, chronic congestive heart failure, iron-deficiency anemia, anxiety/depression, OSA, who presents with difficult expressing herself.  Patient was noticed to have difficulty in speech on Thursday by her daughter. Patient was not able to get right words when she talks to others. Patient stated that she is confused about her daughters names and has difficulty in understanding other's talking.  Today patient feels weak in her left arm, but no weakness, numbness or tingling sensations in other extremities. She had episodic HA for the last couple of days. She does not have vision or hearing change, difficulty swallowing, fever, chills, chest pain, abdominal pain or diarrhea.  CT brain suggests a new small infarct in the left frontal lobe (MCA territory). Neurology was consulted, suggested an admission to inpatient for further workup and treatment.  Review of Systems: As presented in the history of presenting illness, rest negative. General: Not in acute distress  HEENT:  Eyes: PERRL, EOMI, no scleral icterus  ENT: No discharge from the ears and nose, no pharynx  injection, no tonsillar enlargement.  Neck: No JVD, no bruit, no mass felt.  Cardiac: S1/S2, RRR, No murmurs, gallops or rubs  Pulm: Good air movement bilaterally. Clear to auscultation bilaterally. No rales, wheezing, rhonchi or rubs.  Abd: Soft, nondistended, nontender, no rebound pain, no organomegaly, BS present  Ext: No edema. 2+DP/PT pulse bilaterally  Musculoskeletal: No joint deformities, erythema, or stiffness, ROM full  Skin: No rashes.  Neuro: Alert and oriented X3, cranial nerves II-XII grossly intact, muscle strength 5/5 in all extremeties except for left arm which 4/5, sensation to light touch intact. Brachial reflex 2+ bilaterally. Knee reflex 1+ bilaterally. Negative Babinski's sign. finger to nose test is slow with left hand.  Psych: Patient is not psychotic, no suicidal or hemocidal ideation.   Hospital Course by problem list: 1. Left frontal cortex, subcortical white matter, with minor involvement of LEFT insula infarct  2. HYPERTENSION  3. CAD/Chronic combined systolic and diastolic CHF (congestive heart failure)  4. HYPERLIPIDEMIA  6. COPD (chronic obstructive pulmonary disease)  7. HUMAN IMMUNODEFICIENCY VIRUS [HIV]  8. Insominia  9. Chronic low back pain  10. Constipation ? IBS  11. Hypokalemia, resolved  12. Cocaine + UDS  53 y.o presented with difficulty with speech found to have left frontal cortex, subcortical white matter, with minor involvement of LEFT insula infarct.   1. Left frontal cortex, subcortical white matter, with minor involvement of LEFT insula infarct  Etiology is embolic secondary to unknown cause cryptogenic strokes. Cocaine UDS +.  Consulted social work. Encouraged cessation.  Neurology following will need outpatient follow up at discharge. Consider RESPECT ESUS trial per Neurology.  Carotid US negative, work up for malignancy with CT abdomen/pelvis and chest negative, no atrial fibrillation on previous loop recorder readings.  We continued  Plavix and Aspirin therapy (spoke with Dr. Merlene Laughter on day of discharge to confirm dual antiplatelet use) but will only treat for 3 months due to increased risk of intracranial hemorrahge with dual therapy.  After 3 months Aspirin needs to be discontinued. Will discharge to SNF to get PT/OT and Speech services at the facility.     2. HYPERTENSION  Controlled off Imdur 60, Coreg 3.125 mg bid and Lisinopril 20 mg daily which was initially held due to stroke on admission for 24-48 hours.  Will resume by discharge.   3. CAD/Chronic combined systolic and diastolic CHF (congestive heart failure)  Stable. Repeat echo with EF 30-35% with moderate diffuse hypokinesis, RV moderately dilated.  Will resume Imdur, ACEI, BB at discharge and as needed Nitroglycerin.     4. HYPERLIPIDEMIA  Lipid Panel    Component  Value  Date/Time    CHOL  186  05/30/2014 0415    TRIG  86  05/30/2014 0415    HDL  43  05/30/2014 0415    CHOLHDL  4.3  05/30/2014 0415    VLDL  17  05/30/2014 0415    LDLCALC  126*  05/30/2014 0415    Continued Lipitor.   6. COPD (chronic obstructive pulmonary disease)  As needed Albuterol nebulizer, dulera.  Smoking cessation counseling.    7. HUMAN IMMUNODEFICIENCY VIRUS [HIV]  CD4 420 and VL undetectable 01/2014.  She will need to follow up with Infectious Disease.  Continued Stribild.   8. Insominia  Continued Trazadone and Ambien.     9. Chronic low back pain  Currently getting Percocet 5-325 mg every four hours as needed and MS contin 30 mg every 12 hours bid.  Internal Medicine Clinic will no longer prescribe narcotics due to UDS + cocaine.  Skilled nursing facility to reassess pain and give medications as needed.  Patient will not go home with narcotics.    10. Constipation ? IBS  Continued Amitiza as needed at night and Senna   11. Hypokalemia, resolved  Replaced this admission  12. Cocaine + UDS Social work consulted    She was on compression devices for DVT  prophylaxis  Discharge Vitals:   BP 124/75  Pulse 72  Temp(Src) 98.4 F (36.9 C) (Oral)  Resp 18  Ht 5\' 2"  (1.575 m)  Wt 230 lb 4.8 oz (104.463 kg)  BMI 42.11 kg/m2  SpO2 99%  LMP 12/27/2012  Discharge physical exam: General: resting in bed, NAD  HEENT: [place]/at, no scleral icterus  Cardiac: RRR, no rubs, murmurs or gallops  Pulm: clear to auscultation bilaterally, no wheezes, rales, or rhonchi  Abd: soft, nontender, nondistended, BS present  Ext: warm and well perfused, no pedal edema  Neuro: alert and oriented X3, cranial nerves II-XII grossly intact, mild expressive aphasia. 5/5 strength all 4 ext and sensation to light touch decreased in V2 on face. BL gait walks with cane     Discharge Labs:  No results found for this or any previous visit (from the past 24 hour(s)). Results for [name], [name] (MRN [id number]) as of 06/01/2014 12:39  Ref. Range 05/30/2014 04:15 05/30/2014 10:18 05/30/2014 19:25 05/31/2014 13:13 06/01/2014 05:30  Sodium Latest Range: 137-147 mEq/L    139 141  Potassium Latest Range: 3.7-5.3 mEq/L    3.6 (L) 3.8  Chloride Latest Range: 96-112 mEq/L    102 103  CO2 Latest Range: 19-32 mEq/L    28 28  BUN Latest Range: 6-23 mg/dL    12 9  Creatinine Latest Range: 0.50-1.10 mg/dL    0.90 0.85  Calcium Latest Range: 8.4-10.5 mg/dL    9.0 8.8  GFR calc non Af Amer Latest Range: >90 mL/min    72 (L) 77 (L)  GFR calc Af Amer Latest Range: >90 mL/min    83 (L) 89 (L)  Glucose Latest Range: 70-99 mg/dL    86 89  Anion gap Latest Range: 5-15     9 10   Troponin I Latest Range: <0.30 ng/mL <0.30 <0.30 <0.30    Cholesterol Latest Range: 0-200 mg/dL 186      Triglycerides Latest Range: <150 mg/dL 86      HDL Latest Range: >39 mg/dL 43      LDL (calc) Latest Range: 0-99 mg/dL 126 (H)      VLDL Latest Range: 0-40 mg/dL 17      Total CHOL/HDL Ratio No range found 4.3      Results for [name], [name] (MRN [id number]) as of 06/01/2014 12:39  Ref. Range 05/29/2014 19:29  05/30/2014 01:17  Sodium Latest Range:  137-147 mEq/L 139   Potassium Latest Range: 3.7-5.3 mEq/L 2.9 (LL)   Chloride Latest Range: 96-112 mEq/L 103   CO2 Latest Range: 19-32 mEq/L 24   Mean Plasma Glucose Latest Range: <117 mg/dL  111  BUN Latest Range: 6-23 mg/dL 9   Creatinine Latest Range: 0.50-1.10 mg/dL 0.77   Calcium Latest Range: 8.4-10.5 mg/dL 9.0   GFR calc non Af Amer Latest Range: >90 mL/min >90   GFR calc Af Amer Latest Range: >90 mL/min >90   Glucose Latest Range: 70-99 mg/dL 125 (H)   Anion gap Latest Range: 5-15  12   Magnesium Latest Range: 1.5-2.5 mg/dL 1.9   Alkaline Phosphatase Latest Range: 39-117 U/L 59   Albumin Latest Range: 3.5-5.2 g/dL 3.5   AST Latest Range: 0-37 U/L 19   ALT Latest Range: 0-35 U/L 9   Total Protein Latest Range: 6.0-8.3 g/dL 8.1   Total Bilirubin Latest Range: 0.3-1.2 mg/dL 0.6    Results for [name], [name] (MRN [id number]) as of 06/01/2014 12:39  Ref. Range 05/29/2014 19:29 06/01/2014 05:30  WBC Latest Range: 4.0-10.5 K/uL 4.0 4.1  RBC Latest Range: 3.87-5.11 MIL/uL 5.11 4.30  Hemoglobin Latest Range: 12.0-15.0 g/dL 12.6 10.8 (L)  HCT Latest Range: 36.0-46.0 % 39.9 35.3 (L)  MCV Latest Range: 78.0-100.0 fL 78.1 82.1  MCH Latest Range: 26.0-34.0 pg 24.7 (L) 25.1 (L)  MCHC Latest Range: 30.0-36.0 g/dL 31.6 30.6  RDW Latest Range: 11.5-15.5 % 14.7 15.1  Platelets Latest Range: 150-400 K/uL 215 184  Neutrophils Relative % Latest Range: 43-77 % 51   Lymphocytes Relative Latest Range: 12-46 % 39   Monocytes Relative Latest Range: 3-12 % 8   Eosinophils Relative Latest Range: 0-5 % 2   Basophils Relative Latest Range: 0-1 % 0   NEUT# Latest Range: 1.7-7.7 K/uL 2.0   Lymphocytes Absolute Latest Range: 0.7-4.0 K/uL 1.5   Monocytes Absolute Latest Range: 0.1-1.0 K/uL 0.3   Eosinophils Absolute Latest Range: 0.0-0.7 K/uL 0.1   Basophils Absolute Latest Range: 0.0-0.1 K/uL 0.0   Sed Rate Latest Range: 0-22 mm/hr  46 (H)   Results for [name], [name] (MRN [id number]) as of 06/01/2014 12:39  Ref. Range 05/30/2014 01:17  Hemoglobin A1C Latest Range: <5.7 % 5.5  Results for [name], [name] (MRN [id number]) as of 06/01/2014 12:39  Ref. Range 05/30/2014 01:03  Color, Urine Latest Range: YELLOW  YELLOW  APPearance Latest Range: CLEAR  CLEAR  Specific Gravity, Urine Latest Range: 1.005-1.030  1.018  pH Latest Range: 5.0-8.0  6.0  Glucose Latest Range: NEGATIVE mg/dL NEGATIVE  Bilirubin Urine Latest Range: NEGATIVE  NEGATIVE  Ketones, ur Latest Range: NEGATIVE mg/dL NEGATIVE  Protein Latest Range: NEGATIVE mg/dL NEGATIVE  Urobilinogen, UA Latest Range: 0.0-1.0 mg/dL 1.0  Nitrite Latest Range: NEGATIVE  NEGATIVE  Leukocytes, UA Latest Range: NEGATIVE  NEGATIVE  Hgb urine dipstick Latest Range: NEGATIVE  LARGE (A)  Urine-Other No range found MUCOUS PRESENT  WBC, UA Latest Range: <3 WBC/hpf 0-2  RBC / HPF Latest Range: <3 RBC/hpf 21-50  Squamous Epithelial / LPF Latest Range: RARE  RARE  Bacteria, UA Latest Range: RARE  RARE  Casts Latest Range: NEGATIVE  HYALINE CASTS (A)   Results for [name], [name] (MRN [id number]) as of 06/01/2014 12:39  Ref. Range 05/30/2014 23:40  Amphetamines Latest Range: NONE DETECTED  NONE DETECTED  Barbiturates Latest Range: NONE DETECTED  NONE DETECTED  Benzodiazepines Latest Range: NONE DETECTED  NONE DETECTED  Opiates Latest Range: NONE DETECTED  POSITIVE (A)  COCAINE Latest Range: NONE DETECTED  POSITIVE (A)  Tetrahydrocannabinol Latest Range: NONE DETECTED  NONE DETECTED   Signed: Cresenciano Genre, MD 06/02/2014, 7:36 AM    Services Ordered on Discharge: SLP, PT/OT Equipment Ordered on Discharge: none

## 2014-06-01 NOTE — Discharge Instructions (Addendum)
Ischemic Stroke °A stroke (cerebrovascular accident) is the sudden death of brain tissue. It is a medical emergency. A stroke can cause permanent loss of brain function. This can cause problems with different parts of your body. A transient ischemic attack (TIA) is different because it does not cause permanent damage. A TIA is a short-lived problem of poor blood flow affecting a part of the brain. A TIA is also a serious problem because having a TIA greatly increases the chances of having a stroke. When symptoms first develop, you cannot know if the problem might be a stroke or a TIA. °CAUSES  °A stroke is caused by a decrease of oxygen supply to an area of your brain. It is usually the result of a small blood clot or collection of cholesterol or fat (plaque) that blocks blood flow in the brain. A stroke can also be caused by blocked or damaged carotid arteries.  °RISK FACTORS °· High blood pressure (hypertension). °· High cholesterol. °· Diabetes mellitus. °· Heart disease. °· The buildup of plaque in the blood vessels (peripheral artery disease or atherosclerosis). °· The buildup of plaque in the blood vessels providing blood and oxygen to the brain (carotid artery stenosis). °· An abnormal heart rhythm (atrial fibrillation). °· Obesity. °· Smoking. °· Taking oral contraceptives (especially in combination with smoking). °· Physical inactivity. °· A diet high in fats, salt (sodium), and calories. °· Alcohol use. °· Use of illegal drugs (especially cocaine and methamphetamine). °· Being African American. °· Being over the age of 55. °· Family history of stroke. °· Previous history of blood clots, stroke, TIA, or heart attack. °· Sickle cell disease. °SYMPTOMS  °These symptoms usually develop suddenly, or may be newly present upon awakening from sleep: °· Sudden weakness or numbness of the face, arm, or leg, especially on one side of the body. °· Sudden trouble walking or difficulty moving arms or legs. °· Sudden  confusion. °· Sudden personality changes. °· Trouble speaking (aphasia) or understanding. °· Difficulty swallowing. °· Sudden trouble seeing in one or both eyes. °· Double vision. °· Dizziness. °· Loss of balance or coordination. °· Sudden severe headache with no known cause. °· Trouble reading or writing. °DIAGNOSIS  °Your health care provider can often determine the presence or absence of a stroke based on your symptoms, history, and physical exam. Computed tomography (CT) of the brain is usually performed to confirm the stroke, determine causes, and determine stroke severity. Other tests may be done to find the cause of the stroke. These tests may include: °· Electrocardiography. °· Continuous heart monitoring. °· Echocardiography. °· Carotid ultrasonography. °· Magnetic resonance imaging (MRI). °· A scan of the brain circulation. °· Blood tests. °PREVENTION  °The risk of a stroke can be decreased by appropriately treating high blood pressure, high cholesterol, diabetes, heart disease, and obesity and by quitting smoking, limiting alcohol, and staying physically active. °TREATMENT  °Time is of the essence. It is important to seek treatment at the first sign of these symptoms because you may receive a medicine to dissolve the clot (thrombolytic) that cannot be given if too much time has passed since your symptoms began. Even if you do not know when your symptoms began, get treatment as soon as possible as there are other treatment options available including oxygen, intravenous (IV) fluids, and medicines to thin the blood (anticoagulants). Treatment of stroke depends on the duration, severity, and cause of your symptoms. Medicines and dietary changes may be used to address diabetes, high blood   pressure, and other risk factors. Physical, speech, and occupational therapists will assess you and work with you to improve any functions impaired by the stroke. Measures will be taken to prevent short-term and long-term  complications, including infection from breathing foreign material into the lungs (aspiration pneumonia), blood clots in the legs, bedsores, and falls. Rarely, surgery may be needed to remove large blood clots or to open up blocked arteries. °HOME CARE INSTRUCTIONS  °· Take medicines only as directed by your health care provider. Follow the directions carefully. Medicines may be used to control risk factors for a stroke. Be sure you understand all your medicine instructions. °· You may be told to take a medicine to thin the blood, such as aspirin or the anticoagulant warfarin. Warfarin needs to be taken exactly as instructed. °¨ Too much and too little warfarin are both dangerous. Too much warfarin increases the risk of bleeding. Too little warfarin continues to allow the risk for blood clots. While taking warfarin, you will need to have regular blood tests to measure your blood clotting time. These blood tests usually include both the PT and INR tests. The PT and INR results allow your health care provider to adjust your dose of warfarin. The dose can change for many reasons. It is critically important that you take warfarin exactly as prescribed, and that you have your PT and INR levels drawn exactly as directed. °¨ Many foods, especially foods high in vitamin K, can interfere with warfarin and affect the PT and INR results. Foods high in vitamin K include spinach, kale, broccoli, cabbage, collard and turnip greens, brussels sprouts, peas, cauliflower, seaweed, and parsley, as well as beef and pork liver, green tea, and soybean oil. You should eat a consistent amount of foods high in vitamin K. Avoid major changes in your diet, or notify your health care provider before changing your diet. Arrange a visit with a dietitian to answer your questions. °¨ Many medicines can interfere with warfarin and affect the PT and INR results. You must tell your health care provider about any and all medicines you take. This  includes all vitamins and supplements. Be especially cautious with aspirin and anti-inflammatory medicines. Do not take or discontinue any prescribed or over-the-counter medicine except on the advice of your health care provider or pharmacist. °¨ Warfarin can have side effects, such as excessive bruising or bleeding. You will need to hold pressure over cuts for longer than usual. Your health care provider or pharmacist will discuss other potential side effects. °¨ Avoid sports or activities that may cause injury or bleeding. °¨ Be mindful when shaving, flossing your teeth, or handling sharp objects. °¨ Alcohol can change the body's ability to handle warfarin. It is best to avoid alcoholic drinks or consume only very small amounts while taking warfarin. Notify your health care provider if you change your alcohol intake. °¨ Notify your dentist or other health care providers before procedures. °· If swallow studies have determined that your swallowing reflex is present, you should eat healthy foods. Including 5 or more servings of fruits and vegetables a day may reduce the risk of stroke. Foods may need to be a certain consistency (soft or pureed), or small bites may need to be taken in order to avoid aspirating or choking. Certain dietary changes may be advised to address high blood pressure, high cholesterol, diabetes, or obesity. °¨ Food choices that are low in sodium, saturated fat, trans fat, and cholesterol are recommended to manage high blood pressure. °¨   Food choies that are high in fiber, and low in saturated fat, trans fat, and cholesterol may control cholesterol levels.  Controlling carbohydrates and sugar intake is recommended to manage diabetes.  Reducing calorie intake and making food choices that are low in sodium, saturated fat, trans fat, and cholesterol are recommended to manage obesity.  Maintain a healthy weight.  Stay physically active. It is recommended that you get at least 30 minutes of  activity on all or most days.  Do not use any tobacco products including cigarettes, chewing tobacco, or electronic cigarettes.  Limit alcohol use even if you are not taking warfarin. Moderate alcohol use is considered to be:  No more than 2 drinks each day for men.  No more than 1 drink each day for nonpregnant women.  Home safety. A safe home environment is important to reduce the risk of falls. Your health care provider may arrange for specialists to evaluate your home. Having grab bars in the bedroom and bathroom is often important. Your health care provider may arrange for equipment to be used at home, such as raised toilets and a seat for the shower.  Physical, occupational, and speech therapy. Ongoing therapy may be needed to maximize your recovery after a stroke. If you have been advised to use a walker or a cane, use it at all times. Be sure to keep your therapy appointments.  Follow all instructions for follow-up with your health care provider. This is very important. This includes any referrals, physical therapy, rehabilitation, and lab tests. Proper follow-up can prevent another stroke from occurring. SEEK MEDICAL CARE IF:  You have personality changes.  You have difficulty swallowing.  You are seeing double.  You have dizziness.  You have a fever.  You have skin breakdown. SEEK IMMEDIATE MEDICAL CARE IF:  Any of these symptoms may represent a serious problem that is an emergency. Do not wait to see if the symptoms will go away. Get medical help right away. Call your local emergency services (911 in [place] Do not drive yourself to the hospital.  You have sudden weakness or numbness of the face, arm, or leg, especially on one side of the body.  You have sudden trouble walking or difficulty moving arms or legs.  You have sudden confusion.  You have trouble speaking (aphasia) or understanding.  You have sudden trouble seeing in one or both eyes.  You have a loss of  balance or coordination.  You have a sudden, severe headache with no known cause.  You have new chest pain or an irregular heartbeat.  You have a partial or total loss of consciousness. Document Released: 08/23/2005 Document Revised: 01/07/2014 Document Reviewed: 04/02/2012 [facility] Patient Information 2015 [place], [place]. This information is not intended to replace advice given to you by your health care provider. Make sure you discuss any questions you have with your health care provider.  Stroke Prevention Some medical conditions and behaviors are associated with an increased chance of having a stroke. You may prevent a stroke by making healthy choices and managing medical conditions. HOW CAN I REDUCE MY RISK OF HAVING A STROKE?   Stay physically active. Get at least 30 minutes of activity on most or all days.  Do not smoke. It may also be helpful to avoid exposure to secondhand smoke.  Limit alcohol use. Moderate alcohol use is considered to be:  No more than 2 drinks per day for men.  No more than 1 drink per day for nonpregnant women.  Eat healthy foods. This involves:  Eating 5 or more servings of fruits and vegetables a day.  Making dietary changes that address high blood pressure (hypertension), high cholesterol, diabetes, or obesity.  Manage your cholesterol levels.  Making food choices that are high in fiber and low in saturated fat, trans fat, and cholesterol may control cholesterol levels.  Take any prescribed medicines to control cholesterol as directed by your health care provider.  Manage your diabetes.  Controlling your carbohydrate and sugar intake is recommended to manage diabetes.  Take any prescribed medicines to control diabetes as directed by your health care provider.  Control your hypertension.  Making food choices that are low in salt (sodium), saturated fat, trans fat, and cholesterol is recommended to manage hypertension.  Take any prescribed  medicines to control hypertension as directed by your health care provider.  Maintain a healthy weight.  Reducing calorie intake and making food choices that are low in sodium, saturated fat, trans fat, and cholesterol are recommended to manage weight.  Stop drug abuse.  Avoid taking birth control pills.  Talk to your health care provider about the risks of taking birth control pills if you are over 105 years old, smoke, get migraines, or have ever had a blood clot.  Get evaluated for sleep disorders (sleep apnea).  Talk to your health care provider about getting a sleep evaluation if you snore a lot or have excessive sleepiness.  Take medicines only as directed by your health care provider.  For some people, aspirin or blood thinners (anticoagulants) are helpful in reducing the risk of forming abnormal blood clots that can lead to stroke. If you have the irregular heart rhythm of atrial fibrillation, you should be on a blood thinner unless there is a good reason you cannot take them.  Understand all your medicine instructions.  Make sure that other conditions (such as anemia or atherosclerosis) are addressed. SEEK IMMEDIATE MEDICAL CARE IF:   You have sudden weakness or numbness of the face, arm, or leg, especially on one side of the body.  Your face or eyelid droops to one side.  You have sudden confusion.  You have trouble speaking (aphasia) or understanding.  You have sudden trouble seeing in one or both eyes.  You have sudden trouble walking.  You have dizziness.  You have a loss of balance or coordination.  You have a sudden, severe headache with no known cause.  You have new chest pain or an irregular heartbeat. Any of these symptoms may represent a serious problem that is an emergency. Do not wait to see if the symptoms will go away. Get medical help at once. Call your local emergency services (911 in [place] Do not drive yourself to the hospital. Document Released:  09/30/2004 Document Revised: 01/07/2014 Document Reviewed: 02/23/2013 [facility] Patient Information 2015 [place], [place]. This information is not intended to replace advice given to you by your health care provider. Make sure you discuss any questions you have with your health care provider. STROKE/TIA DISCHARGE INSTRUCTIONS SMOKING Cigarette smoking nearly doubles your risk of having a stroke & is the single most alterable risk factor  If you smoke or have smoked in the last 12 months, you are advised to quit smoking for your health.  Most of the excess cardiovascular risk related to smoking disappears within a year of stopping.  Ask you doctor about anti-smoking medications  [facility] Quit Line: 1-800-QUIT NOW  Free Smoking Cessation Classes [phone]  CHOLESTEROL Know your levels;  limit fat & cholesterol in your diet  Lipid Panel     Component Value Date/Time   CHOL 186 05/30/2014 0415   TRIG 86 05/30/2014 0415   HDL 43 05/30/2014 0415   CHOLHDL 4.3 05/30/2014 0415   VLDL 17 05/30/2014 0415   LDLCALC 126* 05/30/2014 0415      Many patients benefit from treatment even if their cholesterol is at goal.  Goal: Total Cholesterol (CHOL) less than 160  Goal:  Triglycerides (TRIG) less than 150  Goal:  HDL greater than 40  Goal:  LDL (LDLCALC) less than 100   BLOOD PRESSURE American Stroke Association blood pressure target is less that 120/80 mm/Hg  Your discharge blood pressure is:  BP: 124/83 mmHg  Monitor your blood pressure  Limit your salt and alcohol intake  Many individuals will require more than one medication for high blood pressure  DIABETES (A1c is a blood sugar average for last 3 months) Goal HGBA1c is under 7% (HBGA1c is blood sugar average for last 3 months)  Diabetes: No known diagnosis of diabetes    Lab Results  Component Value Date   HGBA1C 5.5 05/30/2014     Your HGBA1c can be lowered with medications, healthy diet, and exercise.  Check your blood sugar as  directed by your physician  Call your physician if you experience unexplained or low blood sugars.  PHYSICAL ACTIVITY/REHABILITATION Goal is 30 minutes at least 4 days per week  Activity: Increase activity slowly, Therapies:  Return to work:   Activity decreases your risk of heart attack and stroke and makes your heart stronger.  It helps control your weight and blood pressure; helps you relax and can improve your mood.  Participate in a regular exercise program.  Talk with your doctor about the best form of exercise for you (dancing, walking, swimming, cycling).  DIET/WEIGHT Goal is to maintain a healthy weight  Your discharge diet is:    liquids Your height is:  Height: 5\' 2"  (157.5 cm) Your current weight is: Weight: 104.463 kg (230 lb 4.8 oz) Your Body Mass Index (BMI) is:  BMI (Calculated): 42.2  Following the type of diet specifically designed for you will help prevent another stroke.  Your goal weight range is:    Your goal Body Mass Index (BMI) is 19-24.  Healthy food habits can help reduce 3 risk factors for stroke:  High cholesterol, hypertension, and excess weight.  RESOURCES Stroke/Support Group:  Call [phone]   STROKE EDUCATION PROVIDED/REVIEWED AND GIVEN TO PATIENT Stroke warning signs and symptoms How to activate emergency medical system (call 911). Medications prescribed at discharge. Need for follow-up after discharge. Personal risk factors for stroke. Pneumonia vaccine given:  Flu vaccine given: No My questions have been answered, the writing is legible, and I understand these instructions.  I will adhere to these goals & educational materials that have been provided to me after my discharge from the hospital.

## 2014-06-02 MED ORDER — STROKE: EARLY STAGES OF RECOVERY BOOK
Freq: Once | Status: DC
  Filled 2014-06-02: qty 1

## 2014-06-02 NOTE — Progress Notes (Addendum)
Subjective: Pt denies complaints today.  Doing well.   Objective: Vital signs in last 24 hours: Filed Vitals:   06/01/14 2050 06/02/14 0000 06/02/14 0400 06/02/14 0853  BP:  108/70 124/75   Pulse:  68 72   Temp:  98.8 F (37.1 C) 98.4 F (36.9 C)   TempSrc:      Resp:  18 18   Height:      Weight:   230 lb 4.8 oz (104.463 kg)   SpO2: 96% 99% 99% 95%   Weight change:   Intake/Output Summary (Last 24 hours) at 06/02/14 0904 Last data filed at 06/02/14 0500  Gross per 24 hour  Intake    960 ml  Output   2075 ml  Net  -1115 ml   Vitals reviewed. General: resting in bed, NAD HEENT: [place]/at,  no scleral icterus Cardiac: RRR, no rubs, murmurs or gallops Pulm: clear to auscultation bilaterally, no wheezes, rales, or rhonchi Abd: soft, nontender, nondistended, BS present Ext: warm and well perfused, no pedal edema Neuro: alert and oriented X3 (person, year, month), cranial nerves II-XII grossly intact, 5/5 strength all 4 ext. BL gait walks with cane    Lab Results: Basic Metabolic Panel:  Recent Labs Lab 05/29/14 1929 05/31/14 1313 06/01/14 0530  NA 139 139 141  K 2.9* 3.6* 3.8  CL 103 102 103  CO2 24 28 28   GLUCOSE 125* 86 89  BUN 9 12 9   CREATININE 0.77 0.90 0.85  CALCIUM 9.0 9.0 8.8  MG 1.9  --   --    Liver Function Tests:  Recent Labs Lab 05/29/14 1929  AST 19  ALT 9  ALKPHOS 59  BILITOT 0.6  PROT 8.1  ALBUMIN 3.5  CBC:  Recent Labs Lab 05/29/14 1929 06/01/14 0530  WBC 4.0 4.1  NEUTROABS 2.0  --   HGB 12.6 10.8*  HCT 39.9 35.3*  MCV 78.1 82.1  PLT 215 184   Cardiac Enzymes:  Recent Labs Lab 05/30/14 0415 05/30/14 1018 05/30/14 1925  TROPONINI <0.30 <0.30 <0.30   CBG:  Recent Labs Lab 05/30/14 1319  GLUCAP 99   Hemoglobin A1C:  Recent Labs Lab 05/30/14 0117  HGBA1C 5.5   Fasting Lipid Panel:  Recent Labs Lab 05/30/14 0415  CHOL 186  HDL 43  LDLCALC 126*  TRIG 86  CHOLHDL 4.3   Coagulation:  Recent  Labs Lab 05/30/14 0117  LABPROT 13.9  INR 1.07   Urine Drug Screen: Drugs of Abuse     Component Value Date/Time   LABOPIA POSITIVE* 05/30/2014 2340   LABOPIA PPS 05/16/2014 1215   COCAINSCRNUR POSITIVE* 05/30/2014 2340   COCAINSCRNUR PPS 05/16/2014 1215   LABBENZ NONE DETECTED 05/30/2014 2340   LABBENZ NEG 05/16/2014 1215   LABBENZ NEGATIVE 05/23/2009 0542   AMPHETMU NONE DETECTED 05/30/2014 2340   AMPHETMU NEGATIVE 05/23/2009 0542   THCU NONE DETECTED 05/30/2014 2340   LABBARB NONE DETECTED 05/30/2014 2340   LABBARB NEG 05/16/2014 1215    Urinalysis:  Recent Labs Lab 05/30/14 0103  COLORURINE YELLOW  LABSPEC 1.018  PHURINE 6.0  GLUCOSEU NEGATIVE  HGBUR LARGE*  BILIRUBINUR NEGATIVE  KETONESUR NEGATIVE  PROTEINUR NEGATIVE  UROBILINOGEN 1.0  NITRITE NEGATIVE  LEUKOCYTESUR NEGATIVE   Misc. Labs: none  Studies/Results: Ct Chest W Contrast  05/31/2014   CLINICAL DATA:  Hypercoagulable, cancer screening, history HIV, CHF, MI, COPD  EXAM: CT CHEST, ABDOMEN, AND PELVIS WITH CONTRAST  TECHNIQUE: Multidetector CT imaging of the chest, abdomen and pelvis was  performed following the standard protocol during bolus administration of intravenous contrast.  CONTRAST:  197mL OMNIPAQUE IOHEXOL 300 MG/ML  SOLN  COMPARISON:  CT chest 07/14/2013, CT abdomen and pelvis 03/11/2012  FINDINGS: CT CHEST FINDINGS  Multiple normal sized axillary lymph nodes bilaterally.  No mediastinal or hilar adenopathy.  Coronary arterial stent noted.  Dilatation of LEFT ventricle.  Minimal atelectasis at LEFT lower lobe.  No pulmonary infiltrate, pleural effusion, or mass/nodule.  No acute osseous findings.  CT ABDOMEN AND PELVIS FINDINGS  Gallbladder surgically absent.  Liver, spleen, pancreas, kidneys, and adrenal glands normal appearance.  Normal appendix.  Normal appearing bladder, ureters, uterus and adnexae.  Stomach and bowel loops grossly normal appearance.  No mass, adenopathy, free fluid or inflammatory process.   Normal size inguinal lymph nodes bilaterally.  No acute osseous findings.  IMPRESSION: Enlargement of LEFT ventricle.  Post cholecystectomy.  Otherwise negative CT chest, abdomen and pelvis.   Electronically Signed   By: Lavonia Dana M.D.   On: 05/31/2014 17:29   Ct Abdomen Pelvis W Contrast  05/31/2014   CLINICAL DATA:  Hypercoagulable, cancer screening, history HIV, CHF, MI, COPD  EXAM: CT CHEST, ABDOMEN, AND PELVIS WITH CONTRAST  TECHNIQUE: Multidetector CT imaging of the chest, abdomen and pelvis was performed following the standard protocol during bolus administration of intravenous contrast.  CONTRAST:  156mL OMNIPAQUE IOHEXOL 300 MG/ML  SOLN  COMPARISON:  CT chest 07/14/2013, CT abdomen and pelvis 03/11/2012  FINDINGS: CT CHEST FINDINGS  Multiple normal sized axillary lymph nodes bilaterally.  No mediastinal or hilar adenopathy.  Coronary arterial stent noted.  Dilatation of LEFT ventricle.  Minimal atelectasis at LEFT lower lobe.  No pulmonary infiltrate, pleural effusion, or mass/nodule.  No acute osseous findings.  CT ABDOMEN AND PELVIS FINDINGS  Gallbladder surgically absent.  Liver, spleen, pancreas, kidneys, and adrenal glands normal appearance.  Normal appendix.  Normal appearing bladder, ureters, uterus and adnexae.  Stomach and bowel loops grossly normal appearance.  No mass, adenopathy, free fluid or inflammatory process.  Normal size inguinal lymph nodes bilaterally.  No acute osseous findings.  IMPRESSION: Enlargement of LEFT ventricle.  Post cholecystectomy.  Otherwise negative CT chest, abdomen and pelvis.   Electronically Signed   By: Lavonia Dana M.D.   On: 05/31/2014 17:29   Medications:  Scheduled Meds: .  stroke: mapping our early stages of recovery book   Does not apply Once  . aspirin EC  81 mg Oral Daily  . clopidogrel  75 mg Oral Daily  . elvitegravir-cobicistat-emtricitabine-tenofovir  1 tablet Oral Q breakfast  . ferrous sulfate  325 mg Oral Q breakfast  .  mometasone-formoterol  2 puff Inhalation BID  . morphine  30 mg Oral TID  . multivitamin with minerals  1 tablet Oral Daily  . rosuvastatin  40 mg Oral q1800  . traZODone  50 mg Oral QHS   Continuous Infusions:  PRN Meds:.albuterol, diphenoxylate-atropine, lubiprostone, nitroGLYCERIN, ondansetron (ZOFRAN) IV, oxyCODONE, oxyCODONE-acetaminophen, senna-docusate, zolpidem Assessment/Plan: 53 y.o presented with difficulty with speech found to have left frontal cortex, subcortical white matter, with minor involvement of LEFT insula infarct   #left frontal cortex, subcortical white matter, with minor involvement of LEFT insula infarct  -cocaine UDS will get social work. Encourage cessation -Neurology following will need outpatient f/u at d/c. Consider RESPECT ESUS trial per Neuro -[place] neg, w/u for malignancy with CT ab/pelvis, chest negative, no atrial fibrillation on previous loop recorder.   -We continued Plavix and Aspirin therapy (spoke with Dr.  [place] on day of discharge to confirm dual antiplatelet use) but will only treat for 3 months due to increased risk of intracranial hemorrahge with dual therapy.  After 3 months Aspirin needs to be discontinued. -will d/c to SNF  #HYPERTENSION -controlled off, Imdur 60, Coreg 3.125 mg bid and Lisinopril 20 mg qd-will need to add back for CHF by discharge.   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 repeat echo with EF 30-35% with moderate diffuse HK, RV mod dilated  -will resume Imdur, ACEI, BB at discharge  -prn NTG  #HYPERLIPIDEMIA Lipid Panel     Component Value Date/Time   CHOL 186 05/30/2014 0415   TRIG 86 05/30/2014 0415   HDL 43 05/30/2014 0415   CHOLHDL 4.3 05/30/2014 0415   VLDL 17 05/30/2014 0415   LDLCALC 126* 05/30/2014 0415   -continue Lipitor   #COPD (chronic obstructive pulmonary disease) -prn Albuterol neb, dulera  -smoking cessation   #HUMAN IMMUNODEFICIENCY VIRUS [HIV] -cbd 420  and VL undetectable 01/2014  -will need to f/u with ID Continue Stribild   #Insominia -Trazadone, Ambien  #chronic low back pain -currently getting Percocet and MS contin -[facility] will no longer prescribe to pt -SNF to reassess pain and give medications as needed   #constipation ? IBS -Amitiza prn qhs, Senna   #F/E/N -NSL -HH carb mod diet   #DVT px  -scds    Dispo: will try to discharge to SNF today [facility] and will to [facility] 9/28.  [facility] was not comfortable taking a patient on Sunday so she will go to another SNF and transfer to [facility] 9/28  The patient does have a current PCP Bartholomew Crews, MD) and does need an [facility] hospital follow-up appointment after discharge.  The patient does have transportation limitations that hinder transportation to clinic appointments.  .Services Needed at time of discharge: Y = Yes, Blank = No PT: SNF  OT: SNF  RN:   Equipment:   Other:     LOS: 4 days   Cresenciano Genre, MD [phone] 06/02/2014, 9:04 AM

## 2014-06-02 NOTE — Clinical Social Work Note (Signed)
CSW continues to follow patient for d/c plan. CSW spoke with Dorian Pod [facility]) who stated facility can provide bed for patient on Monday, 9/27 due to pending insurance authorization. CSW obtained authorization for LOG and contacted [facility]. Per Dorian Pod, director not available to provide authorization to accept LOG. CSW contacted [facility] Fransisco Beau) who stated facility could provide bed for patient with LOG. CSW met with patient and made her aware of the above. Per patient, she is agreeable to d/c to [place] and will transfer to [facility] on Monday. Patient reports she will make her daughters aware of the above. CSW made patient's RN Jiles Garter) and MD Aundra Dubin) aware of the above. CSW provided RN with room and report number for [facility]. CSW to arrange transportation via [place]. No further needs. CSW signing off.   Welcome, [place] Weekend Clinical Social Worker [phone]
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
## 2014-06-05 NOTE — Discharge Summary (Signed)
I did not see Ms. Kise on day of discharge, however, I did assist in the discharge planning.

## 2014-06-06 ENCOUNTER — Other Ambulatory Visit: Payer: Self-pay | Admitting: *Deleted

## 2014-06-06 NOTE — Telephone Encounter (Signed)
Difficult situation. I will never prescribe another opioid pill to her. She was cocaine + twice in Sep. She was in hospital for acute CVA after the results and I didn't want to tell her then and potentially delay her D/C by upsetting her. She sent to SNF and her D/C indicated that [facility] would no longer Rx opioids - SNF needed to if they felt appropriate. So either tell her she needs to get from SNF MD or sch with me ASAP so I can tell face to face.

## 2014-06-07 NOTE — Telephone Encounter (Signed)
Have left 2 messages for pt 

## 2014-06-07 NOTE — Telephone Encounter (Signed)
i have spoken to doriss. And she will reschedule pt with dr butcher's input

## 2014-06-10 ENCOUNTER — Other Ambulatory Visit: Payer: Self-pay | Admitting: *Deleted

## 2014-06-13 ENCOUNTER — Encounter: Payer: Self-pay | Admitting: Internal Medicine

## 2014-06-13 ENCOUNTER — Telehealth: Payer: Self-pay | Admitting: *Deleted

## 2014-06-13 NOTE — Telephone Encounter (Signed)
Calling in to schedule 2 month stroke f/u with Dr Leonie Man. Appointment was scheduled with Dr Erlinda Hong on 08/06/14 at 11:30 am, due to Dr Leonie Man is out of the office for stroke rounds and vacation the month of December.

## 2014-06-13 NOTE — Telephone Encounter (Signed)
Pt has an appt 07/03/14 per EPIC.

## 2014-06-13 NOTE — Telephone Encounter (Signed)
Talked with pt this AM - needs appt with Dr Lynnae January reguarding pain med. Talked with Dr Lynnae January - to sch appt 06/14/14 3:15PM Dr Eula Fried - Dr Lynnae January is attending. Pt is aware of seeing Dr Eula Fried and Dr Lynnae January is in clinic. Hilda Blades Jasia Hiltunen RN 06/13/14 10:45AM

## 2014-06-14 ENCOUNTER — Ambulatory Visit (INDEPENDENT_AMBULATORY_CARE_PROVIDER_SITE_OTHER): Payer: Commercial Managed Care - HMO | Admitting: Internal Medicine

## 2014-06-14 ENCOUNTER — Encounter: Payer: Self-pay | Admitting: Internal Medicine

## 2014-06-14 VITALS — BP 129/78 | HR 83 | Temp 98.1°F

## 2014-06-14 DIAGNOSIS — I5042 Chronic combined systolic (congestive) and diastolic (congestive) heart failure: Secondary | ICD-10-CM

## 2014-06-14 DIAGNOSIS — I6350 Cerebral infarction due to unspecified occlusion or stenosis of unspecified cerebral artery: Secondary | ICD-10-CM

## 2014-06-14 DIAGNOSIS — G4733 Obstructive sleep apnea (adult) (pediatric): Secondary | ICD-10-CM

## 2014-06-14 DIAGNOSIS — M25571 Pain in right ankle and joints of right foot: Secondary | ICD-10-CM

## 2014-06-14 DIAGNOSIS — I10 Essential (primary) hypertension: Secondary | ICD-10-CM

## 2014-06-14 DIAGNOSIS — G894 Chronic pain syndrome: Secondary | ICD-10-CM

## 2014-06-14 DIAGNOSIS — G8929 Other chronic pain: Secondary | ICD-10-CM

## 2014-06-14 DIAGNOSIS — I639 Cerebral infarction, unspecified: Secondary | ICD-10-CM

## 2014-06-14 NOTE — Patient Instructions (Signed)
General Instructions: Please bring your medicines with you each time you come to clinic.  Medicines may include prescription medications, over-the-counter medications, herbal remedies, eye drops, vitamins, or other pills.  Please follow up with neurology and also cardiology for further evaluation and surgical clearance  Please continue your asprin and plavix, but aspirin will need to be discontinued in December when yuo will need to follow up with your PCP, Dr. Lynnae January  Progress Toward Treatment Goals:  Treatment Goal 11/08/2013  Blood pressure at goal  Prevent falls -    Self Care Goals & Plans:  Self Care Goal 06/14/2014  Manage my medications take my medicines as prescribed; bring my medications to every visit; refill my medications on time  Monitor my health -  Eat healthy foods eat more vegetables; eat foods that are low in salt; eat baked foods instead of fried foods  Be physically active -  Other -    No flowsheet data found.   Care Management & Community Referrals:  Referral 11/08/2013  Referrals made for care management support nutritionist  Referrals made to community resources -

## 2014-06-14 NOTE — Assessment & Plan Note (Signed)
No CPAP at SNF  -will defer starting process until she is back at home and further discussion with PCP

## 2014-06-14 NOTE — Assessment & Plan Note (Signed)
Recommend follow up with cardiology, especially prior to surgery given low EF and continued substance abuse  -continue BB, IMDUR, and ACEi

## 2014-06-14 NOTE — Assessment & Plan Note (Signed)
Remains on ASA and Plavix since discharge.  -d/c asa in December -follow up with neurology December -still in SNF but possible d/c next week

## 2014-06-14 NOTE — Assessment & Plan Note (Signed)
BP Readings from Last 3 Encounters:  06/14/14 129/78  06/02/14 124/83  05/16/14 118/99   Lab Results  Component Value Date   NA 141 06/01/2014   K 3.8 06/01/2014   CREATININE 0.85 06/01/2014   Assessment: Blood pressure control:   controlled Progress toward BP goal:    at goal Comments: compliant with medications Plan: Medications:  continue current medications coreg, IMDUR, and Lisinopril

## 2014-06-14 NOTE — Assessment & Plan Note (Signed)
Explained the above FYI as per PCP to patient today.   -Did not refill any pain medications today

## 2014-06-14 NOTE — Progress Notes (Signed)
Subjective:   Patient ID: [name] female   DOB: [birth year] 53 y.o.   MRN: [id number]  HPI: [name] is a 53 y.o. female with HIV, combined CHF, and recent new CVA presenting to opc today from SNF for hospital follow up visit.   L frontal cortex infarction: now on ASA and plavix, ASA to be discontinued in December. Follow up with neurology scheduled for December as well. Still in SNF, may be discharged next week she says. Improved cognition back to baseline per patient and improved speech.   Chronic pain and R ankle pain--is no longer to get opiates from [facility] due to continued substance abuse and noted abnormal UDS as per FYI in chart and note from PCP. I explained this to her today, she was initially angry and then tearful but acknowledged understanding but still frustrated.   She presented a surgical clearance form from Dr. Nona Dell office today, however, I cannot clear her for surgery at this time given recent CVA, still on dual antiplatelet therapy with ASA and Plavix, EF of 30% with continued substance abuse. I would recommend first re-evaluation by neurology (currently scheduled for December) to see if anticoagulation can be held for surgery, and also I think she will benefit from cardiac evaluation prior to surgery. I have explained all this to her today, she was again very frustrated but did acknowledge understanding. I reviewed the form with PCP and the staff will fax the form back to Dr. Nona Dell office. She will continue to follow up with him for the ankle in the meantime.   HIV--follow up with Dr. Tommy Medal Past Medical History  Diagnosis Date  . Cholecystitis     Gall bladder removed   . Hypercholesterolemia   . Fibroids   . HIV (human immunodeficiency virus infection) 05/27/11    CD4 460, VL undetectable 10/12/12  . Pelvic pain in female 10/14/11  . PMB (postmenopausal bleeding)     Since 2010  . Increased BMI 05/27/11  . Anxiety   . Depression   . Hypertension     . H/O varicella   . History of measles, mumps, or rubella   . Blood transfusion 1995    "related to losing blood in urine during pregnancy"  . Hypothyroidism     Reports h/o hypothyroidism; not on any meds, TSH wnl over several years  . 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 Myocardial infarction 07/2004  . COPD (chronic obstructive pulmonary disease)     well controlled, only using albuterol inhaler occasionally   . Iron deficiency anemia   . Lower GI bleed 04/24/2012    from hemorrhoids. No recent colonoscopy   . CAD in native artery     s/p stent and restenting of LAD. On plavix  . Anginal pain   . Pneumonia 1980's    "once"  . Chronic bronchitis     "get it q yr" (09/14/2013)  . Stroke 12/2011    R insular ischemic CVA ; residual "speech messes up when I get sick or real tired" (09/14/2013)  . Arthritis     "knees; left shoulder; right anklef/foot" (09/14/2013)  . Bursitis, shoulder     "right"  . Chronic lower back pain   . Carpal tunnel syndrome, bilateral     wears slint bilateral  . History of loop recorder    Current Outpatient Prescriptions  Medication Sig Dispense Refill  .  aspirin EC 81 MG EC tablet Take 1 tablet (81 mg total) by mouth daily.  90 tablet  0  . carvedilol (COREG) 3.125 MG tablet Take 1 tablet (3.125 mg total) by mouth 2 (two) times daily with a meal. 9am and 6pm  60 tablet  5  . clopidogrel (PLAVIX) 75 MG tablet Take 75 mg by mouth daily.      Marland Kitchen elvitegravir-cobicistat-emtricitabine-tenofovir (STRIBILD) 150-150-200-300 MG TABS tablet Take 1 tablet by mouth daily with breakfast.  30 tablet  11  . ferrous sulfate 325 (65 FE) MG tablet Take 1 tablet (325 mg total) by mouth daily with breakfast.  30 tablet  11  . isosorbide mononitrate (IMDUR) 60 MG 24 hr tablet Take 1 tablet (60 mg total) by mouth every evening. 5pm  30 tablet  5  . lisinopril (PRINIVIL,ZESTRIL) 20 MG tablet Take 1 tablet  (20 mg total) by mouth daily.  30 tablet  1  . lubiprostone (AMITIZA) 24 MCG capsule Take 24 mcg by mouth at bedtime as needed for constipation.      Marland Kitchen morphine (MS CONTIN) 30 MG 12 hr tablet Take 1 tablet (30 mg total) by mouth 3 (three) times daily.  90 tablet  0  . Multiple Vitamin (MULTIVITAMIN WITH MINERALS) TABS Take 1 tablet by mouth daily.      Marland Kitchen oxyCODONE-acetaminophen (PERCOCET) 10-325 MG per tablet Take 1 tablet by mouth every 4 (four) hours as needed for pain.  120 tablet  0  . rosuvastatin (CRESTOR) 40 MG tablet Take 1 tablet (40 mg total) by mouth daily at 6 PM.  30 tablet  0  . traZODone (DESYREL) 50 MG tablet Take 50 mg by mouth at bedtime.      Marland Kitchen zolpidem (AMBIEN) 5 MG tablet Take 1 tablet (5 mg total) by mouth at bedtime as needed for sleep.  30 tablet  0  . albuterol (PROVENTIL HFA;VENTOLIN HFA) 108 (90 BASE) MCG/ACT inhaler Inhale 2 puffs into the lungs every 6 (six) hours as needed for wheezing or shortness of breath.  6.7 g  11  . albuterol (PROVENTIL) (2.5 MG/3ML) 0.083% nebulizer solution Take 3 mLs (2.5 mg total) by nebulization every 6 (six) hours as needed for wheezing or shortness of breath. Use MDI first. Use neb if unable to use MDI.  75 mL  1  . beclomethasone (QVAR) 80 MCG/ACT inhaler Inhale 2 puffs into the lungs 2 (two) times daily.      . diphenoxylate-atropine (LOMOTIL) 2.5-0.025 MG per tablet Take 1 tablet by mouth 4 (four) times daily as needed for diarrhea or loose stools.       Marland Kitchen HYDROcodone-acetaminophen (NORCO/VICODIN) 5-325 MG per tablet Take 1 tablet by mouth every 6 (six) hours as needed for moderate pain or severe pain.  10 tablet  0  . naloxone (NARCAN) 1 MG/ML injection Place 1 mL (1 mg total) into the nose as needed (For overdose). Spray 1 mL (1 mg) into each nostril. If no response within 2 to 5 minutes, repeat dose  4 mL  0  . nitroGLYCERIN (NITROSTAT) 0.4 MG SL tablet Place 0.4 mg under the tongue every 5 (five) minutes as needed for chest pain.        . valACYclovir (VALTREX) 500 MG tablet Take 500 mg by mouth daily as needed (for herpes breakouts).        No current facility-administered medications for this visit.   Facility-Administered Medications Ordered in Other Visits  Medication Dose Route Frequency Provider Last Rate  Last Dose  . bupivacaine-epinephrine (MARCAINE W/ EPI) 0.5% -1:200000 injection    Anesthesia Intra-op Albertha Ghee, MD   30 mL at 03/14/14 1449   Family History  Problem Relation Age of Onset  . Hypertension Mother   . Hyperlipidemia Mother   . Obesity Mother   . Heart disease Mother   . Hypertension Maternal Aunt   . Hyperlipidemia Maternal Aunt   . Obesity Maternal Aunt   . Stroke Maternal Aunt    History   Social History  . Marital Status: Single    Spouse Name: N/A    Number of Children: N/A  . Years of Education: N/A   Social History Main Topics  . Smoking status: Former Smoker -- 0.50 packs/day for 29 years    Types: Cigarettes    Quit date: 05/07/2006  . Smokeless tobacco: Never Used  . Alcohol Use: 0.0 oz/week     Comment: Occasional drinker. Wine  . Drug Use: No     Comment:   pt. states has quit drug use and not done any drugs  since 3/201"  . Sexual Activity: None     Comment: gave her condoms   Other Topics Concern  . None   Social History Narrative  . None   Review of Systems:  Constitutional:  Denies fever, chills  HEENT:  Denies congestion  Respiratory:  Denies SOB  Cardiovascular:  Denies chest pain  Gastrointestinal:  Denies nausea, vomiting, abdominal pain  Genitourinary:  Denies dysuria  Musculoskeletal:  R foot pain, chronic pain  Skin:  Denies pallor, rash and wound.   Neurological:  Denies headaches.    Objective:  Physical Exam: Filed Vitals:   06/14/14 1537  BP: 129/78  Pulse: 83  Temp: 98.1 F (36.7 C)  TempSrc: Oral  SpO2: 99%   Vitals reviewed. General: sitting in wheelchair, tearful at times HEENT: EOMI, braided hair Cardiac: RRR Pulm:  clear to auscultation bilaterally Abd: soft, nontender, BS present Ext: R boot in place, lower extremity non-pitting edema, healed surgical wound of right foot, very tender to palpation but no erythema or visible drainage of ankle area, long artifical finger nails Neuro: alert and oriented X3  Assessment & Plan:  Discussed with Dr.Butcher NOT cleared for surgery today, postpone to at least December until further evaluation by neurology and cardiology No opiates from [facility]

## 2014-06-14 NOTE — Assessment & Plan Note (Signed)
Concern for OM in the past but not shown to have OM. Now being recommended for possible ankle fusion surgery? And followed by Dr. Doran Durand.   -she did present a surgical clearance form from Ortho today, however, I do not think she can be safely cleared for surgery at this time given recent CVA, on dual therapy with ASA and Plavix and limited EF with continued substance abuse.  -recommend to postpone if possible until after December when she is to follow up with neurology and hopefully cardiology as well -discussed with pcp who agrees, form faxed to Dr. Nona Dell office

## 2014-06-17 NOTE — Progress Notes (Signed)
Internal Medicine Clinic Attending  Case discussed with Dr. Qureshi soon after the resident saw the patient.  We reviewed the resident's history and exam and pertinent patient test results.  I agree with the assessment, diagnosis, and plan of care documented in the resident's note. 

## 2014-06-18 ENCOUNTER — Telehealth: Payer: Self-pay | Admitting: *Deleted

## 2014-06-18 NOTE — Telephone Encounter (Signed)
[facility] will not be Rxing controlled substances. Her cocaine use was not isolated per UDS's. Dr Barnetta Chapel explained this. She may ask another non-[facility] physician that she sees (ortho) or I can try to refer to pain mgmt - that takes a while and is not guaranteed that she will be accepted.

## 2014-06-18 NOTE — Telephone Encounter (Signed)
Call from pt - stated she's in a nursing home/rehab and she's staying "clean" of drugs and is in a lot pf pain. Stated she knows "messed up" (+ UDS) but she needs her pain medication. Wants to know is there anything else she can do or any assistance program u can refer her to once she get out of the nursing facility?

## 2014-06-19 NOTE — Telephone Encounter (Signed)
Pt called, no answer; message left [facility] will not be prescribing any controlled substances as discussed by Dr Eula Fried at her last office visit and she may ask another non [facility] doctor who she sees; might try pain ctr which takes a while if accepted per Dr Lynnae January; and to call if have any questions.

## 2014-06-27 ENCOUNTER — Ambulatory Visit: Payer: Commercial Managed Care - HMO | Admitting: Internal Medicine

## 2014-07-03 ENCOUNTER — Ambulatory Visit (INDEPENDENT_AMBULATORY_CARE_PROVIDER_SITE_OTHER): Payer: Commercial Managed Care - HMO | Admitting: Internal Medicine

## 2014-07-03 ENCOUNTER — Encounter: Payer: Self-pay | Admitting: Internal Medicine

## 2014-07-03 VITALS — BP 133/89 | HR 84 | Temp 98.2°F | Wt 229.4 lb

## 2014-07-03 DIAGNOSIS — I639 Cerebral infarction, unspecified: Secondary | ICD-10-CM

## 2014-07-03 DIAGNOSIS — I10 Essential (primary) hypertension: Secondary | ICD-10-CM

## 2014-07-03 DIAGNOSIS — I5022 Chronic systolic (congestive) heart failure: Secondary | ICD-10-CM

## 2014-07-03 DIAGNOSIS — Z8673 Personal history of transient ischemic attack (TIA), and cerebral infarction without residual deficits: Secondary | ICD-10-CM

## 2014-07-03 DIAGNOSIS — G4700 Insomnia, unspecified: Secondary | ICD-10-CM

## 2014-07-03 DIAGNOSIS — G894 Chronic pain syndrome: Secondary | ICD-10-CM

## 2014-07-03 DIAGNOSIS — G4733 Obstructive sleep apnea (adult) (pediatric): Secondary | ICD-10-CM

## 2014-07-03 MED ORDER — ALBUTEROL SULFATE (2.5 MG/3ML) 0.083% IN NEBU: 2.5000 mg | INHALATION_SOLUTION | Freq: Four times a day (QID) | RESPIRATORY_TRACT | Status: DC | PRN

## 2014-07-03 MED ORDER — BECLOMETHASONE DIPROPIONATE 80 MCG/ACT IN AERS: 2.0000 | INHALATION_SPRAY | Freq: Two times a day (BID) | RESPIRATORY_TRACT | Status: DC

## 2014-07-03 MED ORDER — ALBUTEROL SULFATE HFA 108 (90 BASE) MCG/ACT IN AERS: 2.0000 | INHALATION_SPRAY | Freq: Four times a day (QID) | RESPIRATORY_TRACT | Status: DC | PRN

## 2014-07-03 MED ORDER — ROSUVASTATIN CALCIUM 40 MG PO TABS: 40.0000 mg | ORAL_TABLET | Freq: Every day | ORAL | Status: DC

## 2014-07-03 MED ORDER — DIPHENOXYLATE-ATROPINE 2.5-0.025 MG PO TABS: 1.0000 | ORAL_TABLET | Freq: Four times a day (QID) | ORAL | Status: DC | PRN

## 2014-07-03 MED ORDER — ELVITEG-COBIC-EMTRICIT-TENOFDF 150-150-200-300 MG PO TABS: 1.0000 | ORAL_TABLET | Freq: Every day | ORAL | Status: DC

## 2014-07-03 MED ORDER — TRAZODONE HCL 50 MG PO TABS
50.0000 mg | ORAL_TABLET | Freq: Every day | ORAL | Status: DC
Start: 2014-07-03 — End: 2014-11-21

## 2014-07-03 MED ORDER — ISOSORBIDE MONONITRATE ER 60 MG PO TB24: 60.0000 mg | ORAL_TABLET | Freq: Every evening | ORAL | Status: DC

## 2014-07-03 MED ORDER — CLOPIDOGREL BISULFATE 75 MG PO TABS: 75.0000 mg | ORAL_TABLET | Freq: Every day | ORAL | Status: DC

## 2014-07-03 MED ORDER — LISINOPRIL 20 MG PO TABS: 20.0000 mg | ORAL_TABLET | Freq: Every day | ORAL | Status: DC

## 2014-07-03 MED ORDER — ASPIRIN 81 MG PO TBEC: 81.0000 mg | DELAYED_RELEASE_TABLET | Freq: Every day | ORAL | Status: AC

## 2014-07-03 MED ORDER — VALACYCLOVIR HCL 500 MG PO TABS: 500.0000 mg | ORAL_TABLET | Freq: Every day | ORAL | Status: DC | PRN

## 2014-07-03 MED ORDER — CARVEDILOL 3.125 MG PO TABS: 3.1250 mg | ORAL_TABLET | Freq: Two times a day (BID) | ORAL | Status: DC

## 2014-07-03 MED ORDER — LUBIPROSTONE 24 MCG PO CAPS: 24.0000 ug | ORAL_CAPSULE | Freq: Every evening | ORAL | Status: AC | PRN

## 2014-07-03 MED ORDER — ZOLPIDEM TARTRATE 5 MG PO TABS: 5.0000 mg | ORAL_TABLET | Freq: Every evening | ORAL | Status: DC | PRN

## 2014-07-03 NOTE — Patient Instructions (Addendum)
  1. We will start with nerve conduction studies for your R hand 2. I sent in all refills to [facility] 3. See me in 4 months 4. Think about a sleep mask for sleep apnea      [facility] will contact you with an appointment.  Their office is located at  [address], [place], [place] [address]. Please call them at [phone] if you dont hear anything.

## 2014-07-03 NOTE — Progress Notes (Signed)
Rx for Ambien, lomotil, and albuterol neb solution called into pharmacy.Despina Hidden Cassady10/28/201512:04 PM

## 2014-07-05 NOTE — Progress Notes (Signed)
Loop Recorder  

## 2014-07-05 NOTE — Assessment & Plan Note (Signed)
She is well controlled on her Coreg 3.125, Lisinopril 20, & Imdur 60. Will cont.  BP Readings from Last 3 Encounters:  07/03/14 133/89  06/14/14 129/78  06/02/14 124/83

## 2014-07-05 NOTE — Assessment & Plan Note (Signed)
She is quite upset at my refusal to Rx opioids and blames me for her cocaine use as supposedly I told her "she would always be in pain." This caused her so much distress that it lead her to do cocaine. I do not have a recording of what I said to her at our first visit. I normally say something like "I can't take away all pain but we can make the pain a bit more tolerable." She is free to go elsewhere for opioids if she desires.  She has new pain today of R hand for one week. 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 The pain is in the whole hand inc all fingers - dorsal and ventral. Does not extend proximally past wrist. She is not dropping things from that hand. Pain is mostly at night. She has not increased her usage of that hand prior to the pain.On exam, there is no muscular wasting. Her strength is 5/5 except inability to keep fingers ABD against pressure. Neg Tinel's test.   Not c/w CTS as both ulnar and radial nerve's. It also doesn't follow any dermatomal pattern. Unclear etiology. Will start with nerve conduction studies. Rec tylenol and NSAIDs.

## 2014-07-05 NOTE — Progress Notes (Signed)
   Subjective:    Patient ID: [name], female    DOB: [birth year], 53 y.o.   MRN: [id number]  Leg Pain     Please see the A&P for the status of the pt's chronic medical problems. This was a difficult visit as [name] was recently told [facility] would no longer Rx controlled substances due to repeated abnl UDS. She states that she will cont to receive care her and find another MD to RX opioids.  Review of Systems  Musculoskeletal: Positive for arthralgias and gait problem.       Wrist pain - see A&P       Objective:   Physical Exam  Constitutional: She is oriented to person, place, and time. She appears well-developed and well-nourished. No distress.  HENT:  Head: Normocephalic and atraumatic.  Right Ear: External ear normal.  Left Ear: External ear normal.  Nose: Nose normal.  Eyes: Conjunctivae and EOM are normal.  Cardiovascular: Normal rate, regular rhythm and normal heart sounds.   Pulmonary/Chest: Effort normal and breath sounds normal.  Musculoskeletal:  See A&P for wrist exam  Neurological: She is alert and oriented to person, place, and time.  Skin: Skin is warm and dry. She is not diaphoretic.  Psychiatric: She has a normal mood and affect. Her behavior is normal. Judgment and thought content normal.          Assessment & Plan:

## 2014-07-05 NOTE — Assessment & Plan Note (Signed)
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 Due to her co-morbidities, she would be candidate for CPAP and was sent to SNF with CPAP but that upset her bc she wasn't told why. There is no indication in inpt chart that I could find. I discussed CPAP with her and she was not yet receptive to using it. Will cont to readdress in future.

## 2014-07-05 NOTE — Assessment & Plan Note (Addendum)
She had a recent CVA - details in overview - and is on ASA & plavix. She was to have had surgery on R foot / ankle and we filled out pre-surg clearance and were not able to clear her for surgery due to recent CVA and that was quite upset her. I told her we only advise surgery on medical risks and she could talk to her surgeon about risks / benefit ratio and also get 2nd opinion from neuro when she F/U.

## 2014-07-08 ENCOUNTER — Encounter (HOSPITAL_COMMUNITY): Payer: Self-pay | Admitting: *Deleted

## 2014-07-08 ENCOUNTER — Emergency Department (HOSPITAL_COMMUNITY)
Admission: EM | Admit: 2014-07-08 | Discharge: 2014-07-08 | Disposition: A | Discharge status: Home / Self Care | Payer: Medicare HMO | Attending: Emergency Medicine | Admitting: Emergency Medicine

## 2014-07-08 DIAGNOSIS — Z8673 Personal history of transient ischemic attack (TIA), and cerebral infarction without residual deficits: Secondary | ICD-10-CM | POA: Diagnosis not present

## 2014-07-08 DIAGNOSIS — Z7951 Long term (current) use of inhaled steroids: Secondary | ICD-10-CM | POA: Diagnosis not present

## 2014-07-08 DIAGNOSIS — Z8619 Personal history of other infectious and parasitic diseases: Secondary | ICD-10-CM | POA: Diagnosis not present

## 2014-07-08 DIAGNOSIS — Z8701 Personal history of pneumonia (recurrent): Secondary | ICD-10-CM | POA: Insufficient documentation

## 2014-07-08 DIAGNOSIS — M79601 Pain in right arm: Secondary | ICD-10-CM

## 2014-07-08 DIAGNOSIS — I2510 Atherosclerotic heart disease of native coronary artery without angina pectoris: Secondary | ICD-10-CM | POA: Insufficient documentation

## 2014-07-08 DIAGNOSIS — M1990 Unspecified osteoarthritis, unspecified site: Secondary | ICD-10-CM | POA: Diagnosis not present

## 2014-07-08 DIAGNOSIS — D509 Iron deficiency anemia, unspecified: Secondary | ICD-10-CM | POA: Insufficient documentation

## 2014-07-08 DIAGNOSIS — Z79899 Other long term (current) drug therapy: Secondary | ICD-10-CM | POA: Diagnosis not present

## 2014-07-08 DIAGNOSIS — Z7982 Long term (current) use of aspirin: Secondary | ICD-10-CM | POA: Insufficient documentation

## 2014-07-08 DIAGNOSIS — I252 Old myocardial infarction: Secondary | ICD-10-CM | POA: Diagnosis not present

## 2014-07-08 DIAGNOSIS — Z21 Asymptomatic human immunodeficiency virus [HIV] infection status: Secondary | ICD-10-CM | POA: Diagnosis not present

## 2014-07-08 DIAGNOSIS — F329 Major depressive disorder, single episode, unspecified: Secondary | ICD-10-CM | POA: Diagnosis not present

## 2014-07-08 DIAGNOSIS — G8929 Other chronic pain: Secondary | ICD-10-CM | POA: Insufficient documentation

## 2014-07-08 DIAGNOSIS — M79604 Pain in right leg: Secondary | ICD-10-CM

## 2014-07-08 DIAGNOSIS — Z87891 Personal history of nicotine dependence: Secondary | ICD-10-CM | POA: Diagnosis not present

## 2014-07-08 DIAGNOSIS — M79661 Pain in right lower leg: Secondary | ICD-10-CM | POA: Diagnosis not present

## 2014-07-08 DIAGNOSIS — J449 Chronic obstructive pulmonary disease, unspecified: Secondary | ICD-10-CM | POA: Insufficient documentation

## 2014-07-08 DIAGNOSIS — I10 Essential (primary) hypertension: Secondary | ICD-10-CM | POA: Insufficient documentation

## 2014-07-08 DIAGNOSIS — Z7902 Long term (current) use of antithrombotics/antiplatelets: Secondary | ICD-10-CM | POA: Diagnosis not present

## 2014-07-08 DIAGNOSIS — I509 Heart failure, unspecified: Secondary | ICD-10-CM | POA: Insufficient documentation

## 2014-07-08 DIAGNOSIS — M25571 Pain in right ankle and joints of right foot: Secondary | ICD-10-CM | POA: Diagnosis present

## 2014-07-08 DIAGNOSIS — F419 Anxiety disorder, unspecified: Secondary | ICD-10-CM | POA: Diagnosis not present

## 2014-07-08 MED ORDER — KETOROLAC TROMETHAMINE 60 MG/2ML IM SOLN
60.0000 mg | Freq: Once | INTRAMUSCULAR | Status: AC
  Administered 2014-07-08: 60 mg via INTRAMUSCULAR
  Filled 2014-07-08: qty 2

## 2014-07-08 NOTE — Discharge Instructions (Signed)
Please follow the directions provided.  Be sure to follow-up with Dr. Doran Durand for management of your leg pain.  You may need to be seen by a pain specialist for further management of this pain.  Don't hesitate to return for new, worsening, or concerning symptoms.  SEEK IMMEDIATE MEDICAL CARE IF:  You feel weak.  You have decreased sensation or numbness.  You lose control of bowel or bladder function.  Your pain suddenly gets much worse.  You develop shaking.  You develop chills.  You develop confusion.  You develop chest pain.  You develop shortness of breath.
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
## 2014-07-08 NOTE — ED Notes (Signed)
Pt reports she has been seen for right foot injury and prescribed pain medication, foot is in boot, but is unable to get screws in her ankle until December. Pt states she had a stroke last month that effected right side. Pt reports she would like stronger pain medication, pt is already prescribed morphine and percocet. Pt is tearful during exam. Pt reports intermittent numbness to right hand since her stroke, this is not a new symptom. Pt states she has been taking percocet for several years.

## 2014-07-09 ENCOUNTER — Other Ambulatory Visit: Payer: Commercial Managed Care - HMO

## 2014-07-10 ENCOUNTER — Telehealth: Payer: Self-pay | Admitting: Cardiovascular Disease

## 2014-07-10 NOTE — Telephone Encounter (Signed)
New message     Returned Kristin's call

## 2014-07-15 NOTE — Telephone Encounter (Signed)
Spoke w/pt and advised to send manuel transmission. Pt instructed on how to send and transmission was received. Episodes were unavailable on summary report. Episodes printed out to be reviewed/kwm

## 2014-07-18 LAB — MDC_IDC_ENUM_SESS_TYPE_REMOTE: Date Time Interrogation Session: 20150921040500

## 2014-07-22 ENCOUNTER — Ambulatory Visit (INDEPENDENT_AMBULATORY_CARE_PROVIDER_SITE_OTHER): Payer: Commercial Managed Care - HMO | Admitting: *Deleted

## 2014-07-22 DIAGNOSIS — I6350 Cerebral infarction due to unspecified occlusion or stenosis of unspecified cerebral artery: Secondary | ICD-10-CM

## 2014-07-22 DIAGNOSIS — I639 Cerebral infarction, unspecified: Secondary | ICD-10-CM

## 2014-07-22 LAB — MDC_IDC_ENUM_SESS_TYPE_REMOTE
MDC IDC SESS DTM: 20151109201855
MDC IDC SET ZONE DETECTION INTERVAL: 3000 ms
Zone Setting Detection Interval: 2000 ms
Zone Setting Detection Interval: 340 ms

## 2014-07-24 ENCOUNTER — Encounter: Payer: Self-pay | Admitting: Internal Medicine

## 2014-07-24 ENCOUNTER — Ambulatory Visit: Payer: Commercial Managed Care - HMO | Admitting: Infectious Disease

## 2014-07-24 NOTE — Progress Notes (Signed)
Loop recorder 

## 2014-07-29 ENCOUNTER — Other Ambulatory Visit: Payer: Self-pay | Admitting: Internal Medicine

## 2014-07-29 NOTE — Telephone Encounter (Signed)
Dr Lynnae January message sent to front desk pool for appt.

## 2014-07-29 NOTE — Telephone Encounter (Signed)
Has Coreg and Imdur - were refilled Oct. HCTZ was D/C'd by [place] November 05, 2013. I have not been rx'ing it. Irene Pap was D/C'd pharm tech [place] Sep 03/07/14 and I have not been Rx'ing it Pls tell her to make appt to discuss HCTZ and Gaba - we have openings tomorrow

## 2014-07-30 ENCOUNTER — Other Ambulatory Visit: Payer: Commercial Managed Care - HMO

## 2014-08-06 ENCOUNTER — Ambulatory Visit: Payer: Commercial Managed Care - HMO | Admitting: Infectious Disease

## 2014-08-06 ENCOUNTER — Other Ambulatory Visit: Payer: Self-pay | Admitting: Internal Medicine

## 2014-08-06 ENCOUNTER — Ambulatory Visit: Payer: Self-pay | Admitting: Neurology

## 2014-08-06 DIAGNOSIS — I639 Cerebral infarction, unspecified: Secondary | ICD-10-CM

## 2014-08-07 ENCOUNTER — Encounter: Payer: Self-pay | Admitting: Infectious Disease

## 2014-08-07 ENCOUNTER — Ambulatory Visit (INDEPENDENT_AMBULATORY_CARE_PROVIDER_SITE_OTHER): Payer: Commercial Managed Care - HMO | Admitting: Infectious Disease

## 2014-08-07 VITALS — BP 100/65 | HR 87 | Temp 98.2°F | Wt 222.0 lb

## 2014-08-07 DIAGNOSIS — F1410 Cocaine abuse, uncomplicated: Secondary | ICD-10-CM | POA: Diagnosis not present

## 2014-08-07 DIAGNOSIS — I10 Essential (primary) hypertension: Secondary | ICD-10-CM

## 2014-08-07 DIAGNOSIS — B20 Human immunodeficiency virus [HIV] disease: Secondary | ICD-10-CM | POA: Diagnosis not present

## 2014-08-07 DIAGNOSIS — I639 Cerebral infarction, unspecified: Secondary | ICD-10-CM

## 2014-08-07 DIAGNOSIS — Z0271 Encounter for disability determination: Secondary | ICD-10-CM | POA: Diagnosis not present

## 2014-08-07 DIAGNOSIS — G894 Chronic pain syndrome: Secondary | ICD-10-CM

## 2014-08-07 DIAGNOSIS — Z113 Encounter for screening for infections with a predominantly sexual mode of transmission: Secondary | ICD-10-CM

## 2014-08-07 DIAGNOSIS — Z9119 Patient's noncompliance with other medical treatment and regimen: Secondary | ICD-10-CM

## 2014-08-07 DIAGNOSIS — Z91199 Patient's noncompliance with other medical treatment and regimen due to unspecified reason: Secondary | ICD-10-CM

## 2014-08-07 DIAGNOSIS — M25471 Effusion, right ankle: Secondary | ICD-10-CM

## 2014-08-07 LAB — CBC WITH DIFFERENTIAL/PLATELET
BASOS PCT: 0 % (ref 0–1)
Basophils Absolute: 0 10*3/uL (ref 0.0–0.1)
Eosinophils Absolute: 0.1 10*3/uL (ref 0.0–0.7)
Eosinophils Relative: 2 % (ref 0–5)
HCT: 36.9 % (ref 36.0–46.0)
Hemoglobin: 12.2 g/dL (ref 12.0–15.0)
Lymphocytes Relative: 27 % (ref 12–46)
Lymphs Abs: 1.2 10*3/uL (ref 0.7–4.0)
MCH: 25.9 pg — AB (ref 26.0–34.0)
MCHC: 33.1 g/dL (ref 30.0–36.0)
MCV: 78.3 fL (ref 78.0–100.0)
MONO ABS: 0.4 10*3/uL (ref 0.1–1.0)
MPV: 10.4 fL (ref 9.4–12.4)
Monocytes Relative: 9 % (ref 3–12)
Neutro Abs: 2.7 10*3/uL (ref 1.7–7.7)
Neutrophils Relative %: 62 % (ref 43–77)
PLATELETS: 217 10*3/uL (ref 150–400)
RBC: 4.71 MIL/uL (ref 3.87–5.11)
RDW: 15.7 % — AB (ref 11.5–15.5)
WBC: 4.4 10*3/uL (ref 4.0–10.5)

## 2014-08-07 LAB — COMPLETE METABOLIC PANEL WITH GFR
ALT: 24 U/L (ref 0–35)
AST: 26 U/L (ref 0–37)
Albumin: 3.4 g/dL — ABNORMAL LOW (ref 3.5–5.2)
Alkaline Phosphatase: 72 U/L (ref 39–117)
BUN: 9 mg/dL (ref 6–23)
CALCIUM: 9.2 mg/dL (ref 8.4–10.5)
CO2: 27 meq/L (ref 19–32)
CREATININE: 0.8 mg/dL (ref 0.50–1.10)
Chloride: 108 mEq/L (ref 96–112)
GFR, Est African American: >89 mL/min
GFR, Est Non African American: 84 mL/min
Glucose, Bld: 93 mg/dL (ref 70–99)
Potassium: 3.8 mEq/L (ref 3.5–5.3)
Sodium: 140 mEq/L (ref 135–145)
TOTAL PROTEIN: 7.1 g/dL (ref 6.0–8.3)
Total Bilirubin: 0.7 mg/dL (ref 0.2–1.2)

## 2014-08-07 LAB — RPR

## 2014-08-07 MED ORDER — EMTRICITABINE-TENOFOVIR DF 200-300 MG PO TABS: 1.0000 | ORAL_TABLET | Freq: Every day | ORAL | Status: AC

## 2014-08-07 MED ORDER — ETRAVIRINE 200 MG PO TABS: 400.0000 mg | ORAL_TABLET | Freq: Every day | ORAL | Status: AC

## 2014-08-07 MED ORDER — ONDANSETRON HCL 4 MG PO TABS: 4.0000 mg | ORAL_TABLET | Freq: Three times a day (TID) | ORAL | Status: DC | PRN

## 2014-08-07 MED ORDER — DARUNAVIR-COBICISTAT 800-150 MG PO TABS: 1.0000 | ORAL_TABLET | Freq: Every day | ORAL | Status: AC

## 2014-08-07 NOTE — Patient Instructions (Signed)
WE ARE CHANGIING YOUR HIV REGIMEN  PLEASE STOP THE STRIBILD IMMEDIATELY  WE WILL START   PREZCOBIX 800/150MG  BIG PINK PILL ONCE DAILY  (CAN BE CUT IN HALF AND SWALLOWED)  AND  TRUVADA ONE BLUE PILL ONCE DAILY  PLEASE COME BACK IN ONE MONTHS TIME FOR RECHECK LABS AND VISIT WITH ME IN 6 WEEKS

## 2014-08-07 NOTE — Progress Notes (Signed)
Subjective:    Patient ID: [name], female    DOB: 09/30/1960, 53 y.o.   MRN: [id number]  HPI  53 year old with HIV previously recently controlled on STRIBILD.  She has been suffering from "bone pain deep in side my foot."  She had MRI in 2013 which was read as:  Findings: The patient has diffuse subcutaneous edema of the  forefoot particularly on the dorsum of the foot. There is  hypertrophy of the synovium at the first metatarsal phalangeal  joint with osteoarthritis. After contrast administration there is  abnormal enhancement of the joint spaces at the fourth and fifth  tarsometatarsal joints and at the first metatarsal phalangeal  joint. No appreciable fluid collections. Findings are consistent  with synovitis of those joints. No evidence of osteomyelitis. No  visible soft tissue abscesses.  IMPRESSION: Synovitis at the fourth and fifth tarsometatarsal  joints and the first metatarsal phalangeal joint. Atypical  infection should be considered.  She had aspiration of the ankle by Dr Tamera Punt but the aspirate was a "dry tap" He did inject steroids. The patient felt better the following day  She was dc but apparently has continue to have pain in this foot.  Ultimately she had another MRI performed which Dr. 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   Dr Doran Durand to her to the OR  for Bone biopsy to pathology and for cultures..  She was dc on keflex after Dr. Doran Durand and I discussed the case. We both agreed pt would need a BKA IF this was indeed osteomyelitis.  Interestingly pathology that is back dId not show osteo or osteonecrosis but "benign bone."  Patient has had VL in the 1K + range and this is because until  March she was "in so much pain and I didn't "give a --" about anything else."  She then went down to VL <20 and healthy CD4 count.  Now due to her severe pain she states that she has only been taking her STRIBLD HALF of the  days int he week.  I reviewed her prior genotypes and in addition to K103 she has actually EXTENSIVE NRTI mutations:  D67N, K70R, M184V, T215F, K219E conferring HIGH LEVEL R To all NRTI except TNF which to which her virus has intermediate activity   STRIBILD has ONLY ONE FULLY ACTIVE DRUG plus partially active TNF.   This is not an appropriate regimen for her--and I do not know how her extensive R was missed in the past but now I have noted it and I am getting her off of it immediately. I worry that she may also have failed this regimen with INI resistance.   Unfortunately her right ankle continues to hurt her and continues to swell when she bears weight.   She has had subsequent MRI and then trip to OR off of antibiotics with Dr. Doran Durand with multiple specimens taken for bacterial, AFB and fungal cultures on 7/23/125 with NGTD on all.   Since then she has had her pain contract terminated in setting of "repeatedly + tox screens for illicit drugs including cocaine in September.  (please see Dr. Zenovia Jarred note re this)  She tells me her pain is now so severe that she can only walk half a blocke with a cane and comes in with a  Wheelchair to clinic.   She has also suffered a CVA which prevented her from getting pre-operative clearance to have ankle fusion by Dr Doran Durand.  Her BP is well controlled today.    Review of Systems  Constitutional: Positive for activity change. Negative for diaphoresis, appetite change, fatigue and unexpected weight change.  HENT: Negative for congestion, sinus pressure, sneezing and trouble swallowing.   Eyes: Negative for photophobia and visual disturbance.  Respiratory: Negative for chest tightness and stridor.   Cardiovascular: Negative for palpitations and leg swelling.  Gastrointestinal: Negative for nausea, vomiting, abdominal pain, diarrhea, constipation, blood in stool, abdominal distention and anal bleeding.  Genitourinary: Negative for dysuria,  hematuria, flank pain and difficulty urinating.  Musculoskeletal: Positive for joint swelling, arthralgias and gait problem. Negative for back pain.  Skin: Negative for color change, pallor and wound.  Neurological: Negative for dizziness, tremors, weakness and light-headedness.  Hematological: Negative for adenopathy. Does not bruise/bleed easily.  Psychiatric/Behavioral: Negative for behavioral problems, confusion, sleep disturbance and agitation.       Objective:   Physical Exam  Constitutional: She is oriented to person, place, and time. She appears well-nourished. No distress.  HENT:  Head: Normocephalic and atraumatic.  Mouth/Throat: Oropharynx is clear and moist.  Eyes: Conjunctivae and EOM are normal. No scleral icterus.  Neck: Normal range of motion. Neck supple.  Cardiovascular: Normal rate and regular rhythm.   Pulmonary/Chest: Breath sounds normal. No respiratory distress. She has no wheezes.  Abdominal: Soft. She exhibits no distension.  Musculoskeletal: She exhibits tenderness. She exhibits no edema.  Neurological: She is alert and oriented to person, place, and time. She exhibits normal muscle tone. Coordination normal.  Skin: Skin is warm and dry. She is not diaphoretic. No erythema. No pallor.  Psychiatric: Her behavior is normal. Judgment and thought content normal. Her mood appears anxious. She exhibits a depressed mood.          Assessment & Plan:   HIV: with MDR HIV,   [facility] genotype database shows:   Drug Resistance Interpretation: PR PI Major Resistance Mutations: None PI Minor Resistance Mutations: None Other Mutations: L63P Protease Inhibitors atazanavir/r (ATV/r) Susceptible darunavir/r (DRV/r) Susceptible fosamprenavir/r (FPV/r) Susceptible indinavir/r (IDV/r) Susceptible lopinavir/r (LPV/r) Susceptible nelfinavir (NFV) Susceptible saquinavir/r (SQV/r) Susceptible tipranavir/r (TPV/r) Susceptible PR Comments  Drug Resistance  Interpretation: RT NRTI Resistance Mutations: D67N, K70R, M184V, T215F, K219E NNRTI Resistance Mutations: K103N Other Mutations: None Nucleoside RTI lamivudine (3TC) High-level resistance abacavir (ABC) High-level resistance zidovudine (AZT) High-level resistance stavudine (D4T) High-level resistance didanosine (DDI) Intermediate resistance emtricitabine (FTC) High-level resistance tenofovir (TDF) Intermediate resistance  --check HIV genotype INI genotype today --check Cd4  --I started her on Prezcobix and Truvada and will also want her to take intelence 400mg  daily unless there are drug-drug problems. Tivicay may be another possibility but would prefer to know about INI resistance mutations first  --rtc in one month for repeat labs and return visit in 6 weeks  Disability :  I filled out her  disability form as she says she is between primary care MDs now due to problems with Dr. Lynnae January, re being "told she would always be in pain" Certainly her ankle effusion and chronic inflammatory problem there which we have never been able to pin down as being due to specific pathology despite TWO surgeries by Dr. Doran Durand and which does not clearly seem to be an infection is causing her disabling pain (by her account). She was to have ankle fusion but that has been delayed due to her CVA. She appears to meet criteria for longterm disability based on antecedent duration of ssx and fact that this has not resolved.  Drug use: with active cocaine use, again not good reason to try low barrier to R ARV for her  HTN: controlled  CVA: no recurrences on correct meds, needs PCP  I spent greater than 40 minutes with the patient including greater than 50% of time in face to face counsel of the patient and in coordination of their care.

## 2014-08-08 ENCOUNTER — Encounter: Payer: Self-pay | Admitting: Neurology

## 2014-08-08 LAB — T-HELPER CELL (CD4) - (RCID CLINIC ONLY)
CD4 % Helper T Cell: 32 % — ABNORMAL LOW (ref 33–55)
CD4 T Cell Abs: 380 /uL — ABNORMAL LOW (ref 400–2700)

## 2014-08-08 LAB — HIV-1 RNA ULTRAQUANT REFLEX TO GENTYP+
HIV 1 RNA Quant: 207 copies/mL — ABNORMAL HIGH (ref <20)
HIV-1 RNA QUANT, LOG: 2.32 {Log} — AB (ref <1.30)

## 2014-08-09 ENCOUNTER — Encounter: Payer: Self-pay | Admitting: Internal Medicine

## 2014-08-09 ENCOUNTER — Ambulatory Visit (INDEPENDENT_AMBULATORY_CARE_PROVIDER_SITE_OTHER): Payer: Commercial Managed Care - HMO | Admitting: *Deleted

## 2014-08-09 DIAGNOSIS — I6350 Cerebral infarction due to unspecified occlusion or stenosis of unspecified cerebral artery: Secondary | ICD-10-CM

## 2014-08-09 DIAGNOSIS — I639 Cerebral infarction, unspecified: Secondary | ICD-10-CM

## 2014-08-15 ENCOUNTER — Encounter (HOSPITAL_COMMUNITY): Payer: Self-pay | Admitting: Internal Medicine

## 2014-08-16 ENCOUNTER — Encounter: Payer: Self-pay | Admitting: Internal Medicine

## 2014-08-22 NOTE — Progress Notes (Signed)
Loop recorder 

## 2014-09-02 ENCOUNTER — Other Ambulatory Visit: Payer: Self-pay | Admitting: Internal Medicine

## 2014-09-04 ENCOUNTER — Other Ambulatory Visit: Payer: Commercial Managed Care - HMO

## 2014-09-11 ENCOUNTER — Other Ambulatory Visit: Payer: Self-pay | Admitting: *Deleted

## 2014-09-11 ENCOUNTER — Other Ambulatory Visit: Payer: Self-pay | Admitting: Internal Medicine

## 2014-09-11 ENCOUNTER — Telehealth: Payer: Self-pay | Admitting: *Deleted

## 2014-09-11 MED ORDER — APIXABAN 5 MG PO TABS: 5.0000 mg | ORAL_TABLET | Freq: Two times a day (BID) | ORAL | Status: AC

## 2014-09-11 NOTE — Telephone Encounter (Signed)
Episode on 08-19-14 needing full transmission. Pt was called several times with success on 09-10-14. Pt sent full transmission on 09-10-14 and GT reviewed strips showing AF. GT recommended starting on Eliquis 5 mg bid. Pt was called on 09-10-14 and message was left. LMOM on 09-11-14 and pt returned call. Pt was instructed to start Eliquis 5 mg bid and to be sent to [facility] on [address]. Pt to stop Plavix. Pt understands and appointment was made for 11-25-14 @ 1430 with JA.

## 2014-09-12 LAB — MDC_IDC_ENUM_SESS_TYPE_REMOTE: Date Time Interrogation Session: 20151129050500

## 2014-09-17 ENCOUNTER — Ambulatory Visit (INDEPENDENT_AMBULATORY_CARE_PROVIDER_SITE_OTHER): Payer: Commercial Managed Care - HMO | Admitting: *Deleted

## 2014-09-17 DIAGNOSIS — I639 Cerebral infarction, unspecified: Secondary | ICD-10-CM

## 2014-09-17 DIAGNOSIS — I6350 Cerebral infarction due to unspecified occlusion or stenosis of unspecified cerebral artery: Secondary | ICD-10-CM

## 2014-09-19 ENCOUNTER — Encounter (HOSPITAL_COMMUNITY): Payer: Self-pay | Admitting: Orthopedic Surgery

## 2014-09-20 NOTE — Progress Notes (Signed)
Loop recorder 

## 2014-09-23 ENCOUNTER — Ambulatory Visit: Payer: Commercial Managed Care - HMO | Admitting: Infectious Disease

## 2014-09-26 ENCOUNTER — Encounter: Payer: Self-pay | Admitting: Internal Medicine

## 2014-09-30 ENCOUNTER — Encounter: Payer: Self-pay | Admitting: Internal Medicine

## 2014-10-01 ENCOUNTER — Other Ambulatory Visit: Payer: Self-pay | Admitting: Infectious Disease

## 2014-10-01 ENCOUNTER — Other Ambulatory Visit: Payer: Self-pay | Admitting: Internal Medicine

## 2014-10-01 NOTE — Telephone Encounter (Signed)
The only time i have spoken to her about Valtrex was Sep and she said she took it for flare ups only. Got #60 30 days ago. Need more info for this med - why is she taking it QD?

## 2014-10-14 ENCOUNTER — Telehealth: Payer: Self-pay | Admitting: Licensed Clinical Social Worker

## 2014-10-14 NOTE — Telephone Encounter (Signed)
[name] was referred to CSW as pt noted transportation to be an issue with making medical appointments.  Pt's chart shows [employer] as her insurer.  This insurance offers free medical transportation.  CSW placed called to pt.  CSW left message requesting return call. CSW provided contact hours and phone number.

## 2014-10-15 LAB — MDC_IDC_ENUM_SESS_TYPE_REMOTE
MDC IDC SESS DTM: 20160105153522
Zone Setting Detection Interval: 2000 ms
Zone Setting Detection Interval: 3000 ms
Zone Setting Detection Interval: 340 ms

## 2014-10-18 ENCOUNTER — Ambulatory Visit (INDEPENDENT_AMBULATORY_CARE_PROVIDER_SITE_OTHER): Payer: Commercial Managed Care - HMO | Admitting: *Deleted

## 2014-10-18 DIAGNOSIS — I639 Cerebral infarction, unspecified: Secondary | ICD-10-CM

## 2014-10-18 DIAGNOSIS — I6350 Cerebral infarction due to unspecified occlusion or stenosis of unspecified cerebral artery: Secondary | ICD-10-CM

## 2014-10-22 NOTE — Telephone Encounter (Signed)
CSW placed call to Ms. Wanat this afternoon.  Pt agrees that transportation is often an issue.  Pt confirms she has [employer].  CSW inquired if pt was aware [employer] provides transportation to medical appointments.  Pt was not aware.  CSW informed Ms. Caruth will send letter in mail as to how to access these benefits.  Pt thanked CSW and denied add'l social work needs at this time.

## 2014-10-23 NOTE — Progress Notes (Signed)
Loop recorder 

## 2014-10-25 ENCOUNTER — Telehealth: Payer: Self-pay | Admitting: *Deleted

## 2014-10-25 NOTE — Telephone Encounter (Signed)
Pt was called by Lorenda Hatchet to reschedule appointment from March. Pt was to start Eliquis on 09-10-14 but pt did not have copay for prescription. Pt never started Eliquis. Pt has transportation issues and is very hard to come in office for appointments. Melissa advised pt to keep appointment for March. Routing to Dr Rayann Heman.

## 2014-10-30 ENCOUNTER — Other Ambulatory Visit: Payer: Self-pay | Admitting: Internal Medicine

## 2014-10-30 LAB — MDC_IDC_ENUM_SESS_TYPE_REMOTE: MDC IDC SESS DTM: 20160205050500

## 2014-10-30 NOTE — Telephone Encounter (Signed)
HIV but well controlled. HIV dosing 1 gm BID for > 5 days is off label. Since HIV well controlled, will use standard dosing. Need to clarify freq of outbreaks next appt

## 2014-10-31 ENCOUNTER — Encounter: Payer: Self-pay | Admitting: Internal Medicine

## 2014-11-01 ENCOUNTER — Encounter: Payer: Self-pay | Admitting: Cardiology

## 2014-11-08 ENCOUNTER — Encounter: Payer: Self-pay | Admitting: Cardiology

## 2014-11-08 ENCOUNTER — Encounter: Payer: Self-pay | Admitting: Internal Medicine

## 2014-11-15 ENCOUNTER — Encounter: Payer: Self-pay | Admitting: Cardiology

## 2014-11-19 ENCOUNTER — Emergency Department (HOSPITAL_COMMUNITY): Payer: Commercial Managed Care - HMO

## 2014-11-19 ENCOUNTER — Inpatient Hospital Stay (HOSPITAL_COMMUNITY): Payer: Commercial Managed Care - HMO

## 2014-11-19 ENCOUNTER — Inpatient Hospital Stay (HOSPITAL_COMMUNITY)
Admission: EM | Admit: 2014-11-19 | Discharge: 2014-12-06 | DRG: 286 | Disposition: E | Payer: Commercial Managed Care - HMO | Attending: Pulmonary Disease | Admitting: Pulmonary Disease

## 2014-11-19 ENCOUNTER — Other Ambulatory Visit (HOSPITAL_COMMUNITY): Payer: Self-pay

## 2014-11-19 ENCOUNTER — Encounter (HOSPITAL_COMMUNITY): Payer: Self-pay | Admitting: Family Medicine

## 2014-11-19 ENCOUNTER — Encounter (HOSPITAL_COMMUNITY): Admission: EM | Disposition: E | Payer: Self-pay | Source: Home / Self Care | Attending: Pulmonary Disease

## 2014-11-19 DIAGNOSIS — Z515 Encounter for palliative care: Secondary | ICD-10-CM

## 2014-11-19 DIAGNOSIS — G931 Anoxic brain damage, not elsewhere classified: Secondary | ICD-10-CM | POA: Diagnosis present

## 2014-11-19 DIAGNOSIS — Z8249 Family history of ischemic heart disease and other diseases of the circulatory system: Secondary | ICD-10-CM

## 2014-11-19 DIAGNOSIS — I480 Paroxysmal atrial fibrillation: Secondary | ICD-10-CM | POA: Insufficient documentation

## 2014-11-19 DIAGNOSIS — I619 Nontraumatic intracerebral hemorrhage, unspecified: Secondary | ICD-10-CM | POA: Diagnosis present

## 2014-11-19 DIAGNOSIS — Z7901 Long term (current) use of anticoagulants: Secondary | ICD-10-CM

## 2014-11-19 DIAGNOSIS — J9601 Acute respiratory failure with hypoxia: Secondary | ICD-10-CM | POA: Diagnosis present

## 2014-11-19 DIAGNOSIS — I469 Cardiac arrest, cause unspecified: Secondary | ICD-10-CM | POA: Diagnosis not present

## 2014-11-19 DIAGNOSIS — M545 Low back pain: Secondary | ICD-10-CM | POA: Diagnosis present

## 2014-11-19 DIAGNOSIS — Z87891 Personal history of nicotine dependence: Secondary | ICD-10-CM | POA: Diagnosis not present

## 2014-11-19 DIAGNOSIS — F1410 Cocaine abuse, uncomplicated: Secondary | ICD-10-CM | POA: Diagnosis present

## 2014-11-19 DIAGNOSIS — E876 Hypokalemia: Secondary | ICD-10-CM | POA: Diagnosis present

## 2014-11-19 DIAGNOSIS — J9600 Acute respiratory failure, unspecified whether with hypoxia or hypercapnia: Secondary | ICD-10-CM

## 2014-11-19 DIAGNOSIS — E785 Hyperlipidemia, unspecified: Secondary | ICD-10-CM | POA: Diagnosis present

## 2014-11-19 DIAGNOSIS — Z955 Presence of coronary angioplasty implant and graft: Secondary | ICD-10-CM | POA: Diagnosis not present

## 2014-11-19 DIAGNOSIS — J449 Chronic obstructive pulmonary disease, unspecified: Secondary | ICD-10-CM | POA: Diagnosis present

## 2014-11-19 DIAGNOSIS — E162 Hypoglycemia, unspecified: Secondary | ICD-10-CM | POA: Diagnosis not present

## 2014-11-19 DIAGNOSIS — I255 Ischemic cardiomyopathy: Secondary | ICD-10-CM | POA: Insufficient documentation

## 2014-11-19 DIAGNOSIS — R008 Other abnormalities of heart beat: Secondary | ICD-10-CM | POA: Diagnosis present

## 2014-11-19 DIAGNOSIS — I2510 Atherosclerotic heart disease of native coronary artery without angina pectoris: Secondary | ICD-10-CM | POA: Diagnosis present

## 2014-11-19 DIAGNOSIS — G4733 Obstructive sleep apnea (adult) (pediatric): Secondary | ICD-10-CM | POA: Diagnosis present

## 2014-11-19 DIAGNOSIS — F329 Major depressive disorder, single episode, unspecified: Secondary | ICD-10-CM | POA: Diagnosis present

## 2014-11-19 DIAGNOSIS — I5023 Acute on chronic systolic (congestive) heart failure: Secondary | ICD-10-CM | POA: Insufficient documentation

## 2014-11-19 DIAGNOSIS — Z9114 Patient's other noncompliance with medication regimen: Secondary | ICD-10-CM | POA: Diagnosis present

## 2014-11-19 DIAGNOSIS — Z7902 Long term (current) use of antithrombotics/antiplatelets: Secondary | ICD-10-CM | POA: Diagnosis not present

## 2014-11-19 DIAGNOSIS — D72829 Elevated white blood cell count, unspecified: Secondary | ICD-10-CM | POA: Diagnosis present

## 2014-11-19 DIAGNOSIS — N179 Acute kidney failure, unspecified: Secondary | ICD-10-CM | POA: Diagnosis present

## 2014-11-19 DIAGNOSIS — R740 Nonspecific elevation of levels of transaminase and lactic acid dehydrogenase [LDH]: Secondary | ICD-10-CM | POA: Diagnosis present

## 2014-11-19 DIAGNOSIS — I472 Ventricular tachycardia: Secondary | ICD-10-CM | POA: Diagnosis present

## 2014-11-19 DIAGNOSIS — E039 Hypothyroidism, unspecified: Secondary | ICD-10-CM | POA: Diagnosis present

## 2014-11-19 DIAGNOSIS — F419 Anxiety disorder, unspecified: Secondary | ICD-10-CM | POA: Diagnosis present

## 2014-11-19 DIAGNOSIS — Z7951 Long term (current) use of inhaled steroids: Secondary | ICD-10-CM | POA: Diagnosis not present

## 2014-11-19 DIAGNOSIS — Z8673 Personal history of transient ischemic attack (TIA), and cerebral infarction without residual deficits: Secondary | ICD-10-CM | POA: Diagnosis not present

## 2014-11-19 DIAGNOSIS — Z66 Do not resuscitate: Secondary | ICD-10-CM | POA: Diagnosis not present

## 2014-11-19 DIAGNOSIS — I252 Old myocardial infarction: Secondary | ICD-10-CM | POA: Diagnosis not present

## 2014-11-19 DIAGNOSIS — R403 Persistent vegetative state: Secondary | ICD-10-CM | POA: Diagnosis present

## 2014-11-19 DIAGNOSIS — Z8674 Personal history of sudden cardiac arrest: Secondary | ICD-10-CM | POA: Diagnosis not present

## 2014-11-19 DIAGNOSIS — J9602 Acute respiratory failure with hypercapnia: Secondary | ICD-10-CM | POA: Diagnosis present

## 2014-11-19 DIAGNOSIS — R570 Cardiogenic shock: Secondary | ICD-10-CM

## 2014-11-19 DIAGNOSIS — R40243 Glasgow coma scale score 3-8: Secondary | ICD-10-CM | POA: Diagnosis present

## 2014-11-19 DIAGNOSIS — E780 Pure hypercholesterolemia: Secondary | ICD-10-CM | POA: Diagnosis present

## 2014-11-19 DIAGNOSIS — B20 Human immunodeficiency virus [HIV] disease: Secondary | ICD-10-CM | POA: Diagnosis present

## 2014-11-19 DIAGNOSIS — Z5181 Encounter for therapeutic drug level monitoring: Secondary | ICD-10-CM | POA: Insufficient documentation

## 2014-11-19 DIAGNOSIS — I4901 Ventricular fibrillation: Principal | ICD-10-CM | POA: Diagnosis present

## 2014-11-19 DIAGNOSIS — Z7982 Long term (current) use of aspirin: Secondary | ICD-10-CM

## 2014-11-19 DIAGNOSIS — Z6837 Body mass index (BMI) 37.0-37.9, adult: Secondary | ICD-10-CM

## 2014-11-19 DIAGNOSIS — K7200 Acute and subacute hepatic failure without coma: Secondary | ICD-10-CM | POA: Diagnosis present

## 2014-11-19 DIAGNOSIS — E872 Acidosis: Secondary | ICD-10-CM | POA: Diagnosis present

## 2014-11-19 DIAGNOSIS — D638 Anemia in other chronic diseases classified elsewhere: Secondary | ICD-10-CM | POA: Diagnosis present

## 2014-11-19 DIAGNOSIS — G8929 Other chronic pain: Secondary | ICD-10-CM | POA: Diagnosis present

## 2014-11-19 DIAGNOSIS — Z21 Asymptomatic human immunodeficiency virus [HIV] infection status: Secondary | ICD-10-CM | POA: Diagnosis present

## 2014-11-19 DIAGNOSIS — I10 Essential (primary) hypertension: Secondary | ICD-10-CM | POA: Diagnosis present

## 2014-11-19 DIAGNOSIS — Z9289 Personal history of other medical treatment: Secondary | ICD-10-CM

## 2014-11-19 HISTORY — PX: LEFT HEART CATHETERIZATION WITH CORONARY ANGIOGRAM: SHX5451

## 2014-11-19 LAB — RAPID URINE DRUG SCREEN, HOSP PERFORMED
AMPHETAMINES: NOT DETECTED
BENZODIAZEPINES: POSITIVE — AB
Barbiturates: NOT DETECTED
Cocaine: POSITIVE — AB
Opiates: NOT DETECTED
Tetrahydrocannabinol: NOT DETECTED

## 2014-11-19 LAB — POCT I-STAT, CHEM 8
BUN: 12 mg/dL (ref 6–23)
BUN: 15 mg/dL (ref 6–23)
Calcium, Ion: 1.1 mmol/L — ABNORMAL LOW (ref 1.12–1.23)
Calcium, Ion: 1.11 mmol/L — ABNORMAL LOW (ref 1.12–1.23)
Chloride: 112 mmol/L (ref 96–112)
Chloride: 112 mmol/L (ref 96–112)
Creatinine, Ser: 1 mg/dL (ref 0.50–1.10)
Creatinine, Ser: 1.1 mg/dL (ref 0.50–1.10)
GLUCOSE: 184 mg/dL — AB (ref 70–99)
Glucose, Bld: 218 mg/dL — ABNORMAL HIGH (ref 70–99)
HCT: 41 % (ref 36.0–46.0)
HCT: 42 % (ref 36.0–46.0)
HEMOGLOBIN: 13.9 g/dL (ref 12.0–15.0)
HEMOGLOBIN: 14.3 g/dL (ref 12.0–15.0)
POTASSIUM: 3.4 mmol/L — AB (ref 3.5–5.1)
Potassium: 2.8 mmol/L — ABNORMAL LOW (ref 3.5–5.1)
SODIUM: 145 mmol/L (ref 135–145)
Sodium: 149 mmol/L — ABNORMAL HIGH (ref 135–145)
TCO2: 15 mmol/L (ref 0–100)
TCO2: 15 mmol/L (ref 0–100)

## 2014-11-19 LAB — BASIC METABOLIC PANEL
Anion gap: 13 (ref 5–15)
Anion gap: 7 (ref 5–15)
Anion gap: 5 (ref 5–15)
BUN: 9 mg/dL (ref 6–23)
BUN: 12 mg/dL (ref 6–23)
BUN: 15 mg/dL (ref 6–23)
CALCIUM: 7.3 mg/dL — AB (ref 8.4–10.5)
CHLORIDE: 117 mmol/L — AB (ref 96–112)
CO2: 15 mmol/L — AB (ref 19–32)
CO2: 19 mmol/L (ref 19–32)
CO2: 21 mmol/L (ref 19–32)
CREATININE: 1.28 mg/dL — AB (ref 0.50–1.10)
CREATININE: 1.26 mg/dL — AB (ref 0.50–1.10)
Calcium: 7.1 mg/dL — ABNORMAL LOW (ref 8.4–10.5)
Calcium: 7.5 mg/dL — ABNORMAL LOW (ref 8.4–10.5)
Chloride: 116 mmol/L — ABNORMAL HIGH (ref 96–112)
Chloride: 114 mmol/L — ABNORMAL HIGH (ref 96–112)
Creatinine, Ser: 1.21 mg/dL — ABNORMAL HIGH (ref 0.50–1.10)
GFR calc Af Amer: 54 mL/min — ABNORMAL LOW (ref >90)
GFR calc Af Amer: 58 mL/min — ABNORMAL LOW (ref >90)
GFR calc Af Amer: 55 mL/min — ABNORMAL LOW (ref >90)
GFR calc non Af Amer: 47 mL/min — ABNORMAL LOW (ref >90)
GFR calc non Af Amer: 50 mL/min — ABNORMAL LOW (ref >90)
GFR, EST NON AFRICAN AMERICAN: 48 mL/min — AB (ref >90)
GLUCOSE: 228 mg/dL — AB (ref 70–99)
GLUCOSE: 199 mg/dL — AB (ref 70–99)
Glucose, Bld: 139 mg/dL — ABNORMAL HIGH (ref 70–99)
POTASSIUM: 3.4 mmol/L — AB (ref 3.5–5.1)
Potassium: 3.3 mmol/L — ABNORMAL LOW (ref 3.5–5.1)
Potassium: 3 mmol/L — ABNORMAL LOW (ref 3.5–5.1)
Sodium: 144 mmol/L (ref 135–145)
Sodium: 143 mmol/L (ref 135–145)
Sodium: 140 mmol/L (ref 135–145)

## 2014-11-19 LAB — I-STAT TROPONIN, ED: Troponin i, poc: 0.02 ng/mL (ref 0.00–0.08)

## 2014-11-19 LAB — TROPONIN I
Troponin I: 0.11 ng/mL — ABNORMAL HIGH (ref <0.031)
Troponin I: 0.81 ng/mL (ref <0.031)

## 2014-11-19 LAB — CBC WITH DIFFERENTIAL/PLATELET
Basophils Absolute: 0.1 10*3/uL (ref 0.0–0.1)
Basophils Relative: 1 % (ref 0–1)
Eosinophils Absolute: 0.1 10*3/uL (ref 0.0–0.7)
Eosinophils Relative: 1 % (ref 0–5)
HCT: 35.6 % — ABNORMAL LOW (ref 36.0–46.0)
Hemoglobin: 10.5 g/dL — ABNORMAL LOW (ref 12.0–15.0)
LYMPHS PCT: 51 % — AB (ref 12–46)
Lymphs Abs: 6.9 10*3/uL — ABNORMAL HIGH (ref 0.7–4.0)
MCH: 25.2 pg — ABNORMAL LOW (ref 26.0–34.0)
MCHC: 29.5 g/dL — ABNORMAL LOW (ref 30.0–36.0)
MCV: 85.6 fL (ref 78.0–100.0)
MONOS PCT: 7 % (ref 3–12)
Monocytes Absolute: 0.9 10*3/uL (ref 0.1–1.0)
NEUTROS PCT: 40 % — AB (ref 43–77)
Neutro Abs: 5.4 10*3/uL (ref 1.7–7.7)
Platelets: 170 10*3/uL (ref 150–400)
RBC: 4.16 MIL/uL (ref 3.87–5.11)
RDW: 15.1 % (ref 11.5–15.5)
WBC: 13.4 10*3/uL — ABNORMAL HIGH (ref 4.0–10.5)

## 2014-11-19 LAB — COMPREHENSIVE METABOLIC PANEL
ALBUMIN: 2.7 g/dL — AB (ref 3.5–5.2)
ALT: 52 U/L — ABNORMAL HIGH (ref 0–35)
AST: 106 U/L — AB (ref 0–37)
Alkaline Phosphatase: 70 U/L (ref 39–117)
Anion gap: 17 — ABNORMAL HIGH (ref 5–15)
BUN: 8 mg/dL (ref 6–23)
CALCIUM: 8.2 mg/dL — AB (ref 8.4–10.5)
CHLORIDE: 108 mmol/L (ref 96–112)
CO2: 16 mmol/L — ABNORMAL LOW (ref 19–32)
Creatinine, Ser: 1.45 mg/dL — ABNORMAL HIGH (ref 0.50–1.10)
GFR calc Af Amer: 47 mL/min — ABNORMAL LOW (ref >90)
GFR calc non Af Amer: 40 mL/min — ABNORMAL LOW (ref >90)
Glucose, Bld: 316 mg/dL — ABNORMAL HIGH (ref 70–99)
POTASSIUM: 3.5 mmol/L (ref 3.5–5.1)
Sodium: 141 mmol/L (ref 135–145)
TOTAL PROTEIN: 6.6 g/dL (ref 6.0–8.3)
Total Bilirubin: 0.6 mg/dL (ref 0.3–1.2)

## 2014-11-19 LAB — CBC
HCT: 40.4 % (ref 36.0–46.0)
Hemoglobin: 13 g/dL (ref 12.0–15.0)
MCH: 26.4 pg (ref 26.0–34.0)
MCHC: 32.2 g/dL (ref 30.0–36.0)
MCV: 81.9 fL (ref 78.0–100.0)
PLATELETS: 197 10*3/uL (ref 150–400)
RBC: 4.93 MIL/uL (ref 3.87–5.11)
RDW: 15 % (ref 11.5–15.5)
WBC: 10.4 10*3/uL (ref 4.0–10.5)

## 2014-11-19 LAB — I-STAT ARTERIAL BLOOD GAS, ED
Acid-base deficit: 10 mmol/L — ABNORMAL HIGH (ref 0.0–2.0)
Bicarbonate: 18.4 mEq/L — ABNORMAL LOW (ref 20.0–24.0)
O2 Saturation: 100 %
PCO2 ART: 50 mmHg — AB (ref 35.0–45.0)
PH ART: 7.173 — AB (ref 7.350–7.450)
PO2 ART: 397 mmHg — AB (ref 80.0–100.0)
TCO2: 20 mmol/L (ref 0–100)

## 2014-11-19 LAB — GLUCOSE, CAPILLARY
GLUCOSE-CAPILLARY: 141 mg/dL — AB (ref 70–99)
Glucose-Capillary: 171 mg/dL — ABNORMAL HIGH (ref 70–99)
Glucose-Capillary: 210 mg/dL — ABNORMAL HIGH (ref 70–99)

## 2014-11-19 LAB — PREGNANCY, URINE: Preg Test, Ur: NEGATIVE

## 2014-11-19 LAB — TSH: TSH: 6.384 u[IU]/mL — ABNORMAL HIGH (ref 0.350–4.500)

## 2014-11-19 LAB — PROTIME-INR
INR: 1.27 (ref 0.00–1.49)
INR: 1.1 (ref 0.00–1.49)
Prothrombin Time: 16.1 seconds — ABNORMAL HIGH (ref 11.6–15.2)
Prothrombin Time: 14.3 seconds (ref 11.6–15.2)

## 2014-11-19 LAB — APTT
aPTT: 30 seconds (ref 24–37)
aPTT: 29 seconds (ref 24–37)

## 2014-11-19 LAB — HEPARIN LEVEL (UNFRACTIONATED): Heparin Unfractionated: <0.1 IU/mL — ABNORMAL LOW (ref 0.30–0.70)

## 2014-11-19 LAB — I-STAT CG4 LACTIC ACID, ED: Lactic Acid, Venous: 13.58 mmol/L (ref 0.5–2.0)

## 2014-11-19 LAB — MRSA PCR SCREENING: MRSA by PCR: NEGATIVE

## 2014-11-19 LAB — POCT ACTIVATED CLOTTING TIME: Activated Clotting Time: 79 seconds

## 2014-11-19 SURGERY — LEFT HEART CATHETERIZATION WITH CORONARY ANGIOGRAM
Anesthesia: LOCAL

## 2014-11-19 MED ORDER — SODIUM CHLORIDE 0.9 % IV SOLN: 2000.0000 mL | Freq: Once | INTRAVENOUS | Status: DC

## 2014-11-19 MED ORDER — NOREPINEPHRINE BITARTRATE 1 MG/ML IV SOLN
0.0000 ug/min | INTRAVENOUS | Status: DC
  Filled 2014-11-19: qty 4

## 2014-11-19 MED ORDER — IPRATROPIUM-ALBUTEROL 0.5-2.5 (3) MG/3ML IN SOLN
3.0000 mL | Freq: Four times a day (QID) | RESPIRATORY_TRACT | Status: DC
  Administered 2014-11-19 – 2014-11-22 (×13): 3 mL via RESPIRATORY_TRACT
  Filled 2014-11-19 (×14): qty 3

## 2014-11-19 MED ORDER — SUCCINYLCHOLINE CHLORIDE 20 MG/ML IJ SOLN
150.0000 mg | Freq: Once | INTRAMUSCULAR | Status: AC
  Administered 2014-11-19: 150 mg via INTRAVENOUS
  Filled 2014-11-19: qty 7.5

## 2014-11-19 MED ORDER — MIDAZOLAM HCL 2 MG/2ML IJ SOLN: 2.0000 mg | Freq: Once | INTRAMUSCULAR | Status: DC

## 2014-11-19 MED ORDER — ROCURONIUM BROMIDE 50 MG/5ML IV SOLN
INTRAVENOUS | Status: AC
  Filled 2014-11-19: qty 2

## 2014-11-19 MED ORDER — SODIUM CHLORIDE 0.9 % IV SOLN: 250.0000 mL | INTRAVENOUS | Status: DC | PRN

## 2014-11-19 MED ORDER — NOREPINEPHRINE BITARTRATE 1 MG/ML IV SOLN
0.0000 ug/min | INTRAVENOUS | Status: DC
  Administered 2014-11-19: 5 ug/min via INTRAVENOUS
  Filled 2014-11-19: qty 4

## 2014-11-19 MED ORDER — ARTIFICIAL TEARS OP OINT
1.0000 "application " | TOPICAL_OINTMENT | Freq: Three times a day (TID) | OPHTHALMIC | Status: DC
  Administered 2014-11-19 – 2014-11-21 (×5): 1 via OPHTHALMIC
  Filled 2014-11-19 (×2): qty 3.5

## 2014-11-19 MED ORDER — SODIUM CHLORIDE 0.9 % IJ SOLN
3.0000 mL | Freq: Two times a day (BID) | INTRAMUSCULAR | Status: DC
  Administered 2014-11-19 – 2014-11-22 (×6): 3 mL via INTRAVENOUS

## 2014-11-19 MED ORDER — ETOMIDATE 2 MG/ML IV SOLN
20.0000 mg | Freq: Once | INTRAVENOUS | Status: AC
  Administered 2014-11-19: 20 mg via INTRAVENOUS

## 2014-11-19 MED ORDER — MIDAZOLAM BOLUS VIA INFUSION
2.0000 mg | INTRAVENOUS | Status: DC | PRN
  Filled 2014-11-19: qty 2

## 2014-11-19 MED ORDER — POTASSIUM CHLORIDE 10 MEQ/50ML IV SOLN
10.0000 meq | INTRAVENOUS | Status: AC
  Filled 2014-11-19: qty 50

## 2014-11-19 MED ORDER — NOREPINEPHRINE BITARTRATE 1 MG/ML IV SOLN
0.0000 ug/min | INTRAVENOUS | Status: DC
  Administered 2014-11-19: 10 ug/min via INTRAVENOUS
  Administered 2014-11-20: 16 ug/min via INTRAVENOUS
  Filled 2014-11-19 (×3): qty 16

## 2014-11-19 MED ORDER — BUDESONIDE 0.25 MG/2ML IN SUSP
0.5000 mg | Freq: Two times a day (BID) | RESPIRATORY_TRACT | Status: DC
  Administered 2014-11-20: 0.5 mg via RESPIRATORY_TRACT
  Filled 2014-11-19 (×6): qty 4

## 2014-11-19 MED ORDER — ASPIRIN 300 MG RE SUPP
300.0000 mg | RECTAL | Status: AC
  Administered 2014-11-19: 300 mg via RECTAL
  Filled 2014-11-19: qty 1

## 2014-11-19 MED ORDER — SODIUM CHLORIDE 0.9 % IV SOLN
INTRAVENOUS | Status: DC
  Administered 2014-11-19: 14:00:00 via INTRAVENOUS

## 2014-11-19 MED ORDER — POTASSIUM CHLORIDE 10 MEQ/50ML IV SOLN
INTRAVENOUS | Status: AC
  Administered 2014-11-19: 10 meq
  Filled 2014-11-19: qty 50

## 2014-11-19 MED ORDER — DARUNAVIR-COBICISTAT 800-150 MG PO TABS
1.0000 | ORAL_TABLET | Freq: Every day | ORAL | Status: DC
  Administered 2014-11-20: 1 via ORAL
  Filled 2014-11-19 (×2): qty 1

## 2014-11-19 MED ORDER — CISATRACURIUM BOLUS VIA INFUSION
0.1000 mg/kg | Freq: Once | INTRAVENOUS | Status: AC
  Administered 2014-11-19: 10 mg via INTRAVENOUS
  Filled 2014-11-19: qty 10

## 2014-11-19 MED ORDER — SODIUM CHLORIDE 0.9 % IV SOLN: INTRAVENOUS | Status: DC

## 2014-11-19 MED ORDER — ETRAVIRINE 100 MG PO TABS
200.0000 mg | ORAL_TABLET | Freq: Two times a day (BID) | ORAL | Status: DC
  Administered 2014-11-20 (×2): 200 mg via ORAL
  Filled 2014-11-19 (×5): qty 2

## 2014-11-19 MED ORDER — AMIODARONE HCL IN DEXTROSE 360-4.14 MG/200ML-% IV SOLN
30.0000 mg/h | INTRAVENOUS | Status: DC
  Filled 2014-11-19: qty 200

## 2014-11-19 MED ORDER — SODIUM CHLORIDE 0.9 % IV SOLN
25.0000 ug/h | INTRAVENOUS | Status: DC
  Administered 2014-11-19 (×2): 75 ug/h via INTRAVENOUS
  Administered 2014-11-20 (×2): 200 ug/h via INTRAVENOUS
  Filled 2014-11-19 (×3): qty 50

## 2014-11-19 MED ORDER — FENTANYL BOLUS VIA INFUSION
50.0000 ug | INTRAVENOUS | Status: DC | PRN
  Filled 2014-11-19: qty 50

## 2014-11-19 MED ORDER — PANTOPRAZOLE SODIUM 40 MG IV SOLR
40.0000 mg | Freq: Every day | INTRAVENOUS | Status: DC
  Administered 2014-11-19 – 2014-11-21 (×3): 40 mg via INTRAVENOUS
  Filled 2014-11-19 (×4): qty 40

## 2014-11-19 MED ORDER — SODIUM CHLORIDE 0.9 % IV SOLN
1.0000 ug/kg/min | INTRAVENOUS | Status: DC
  Filled 2014-11-19: qty 20

## 2014-11-19 MED ORDER — SODIUM CHLORIDE 0.9 % IV SOLN: 1.0000 mg/h | INTRAVENOUS | Status: DC

## 2014-11-19 MED ORDER — CHLORHEXIDINE GLUCONATE 0.12 % MT SOLN
OROMUCOSAL | Status: AC
  Filled 2014-11-19: qty 15

## 2014-11-19 MED ORDER — SODIUM CHLORIDE 0.9 % IV SOLN
1.0000 ug/kg/min | INTRAVENOUS | Status: DC
  Administered 2014-11-19: 1 ug/kg/min via INTRAVENOUS
  Administered 2014-11-20: 1.5 ug/kg/min via INTRAVENOUS
  Filled 2014-11-19 (×2): qty 20

## 2014-11-19 MED ORDER — ETOMIDATE 2 MG/ML IV SOLN
INTRAVENOUS | Status: AC
  Administered 2014-11-19: 20 mg via INTRAVENOUS
  Filled 2014-11-19: qty 20

## 2014-11-19 MED ORDER — ALBUTEROL SULFATE (2.5 MG/3ML) 0.083% IN NEBU: 2.5000 mg | INHALATION_SOLUTION | RESPIRATORY_TRACT | Status: DC | PRN

## 2014-11-19 MED ORDER — EMTRICITABINE-TENOFOVIR DF 200-300 MG PO TABS
1.0000 | ORAL_TABLET | Freq: Every day | ORAL | Status: DC
  Administered 2014-11-20: 1 via ORAL
  Filled 2014-11-19: qty 1

## 2014-11-19 MED ORDER — NITROGLYCERIN 1 MG/10 ML FOR IR/CATH LAB
INTRA_ARTERIAL | Status: AC
  Filled 2014-11-19: qty 10

## 2014-11-19 MED ORDER — HEPARIN BOLUS VIA INFUSION
2500.0000 [IU] | Freq: Once | INTRAVENOUS | Status: DC
  Filled 2014-11-19: qty 2500

## 2014-11-19 MED ORDER — SUCCINYLCHOLINE CHLORIDE 20 MG/ML IJ SOLN
INTRAMUSCULAR | Status: AC
  Administered 2014-11-19: 150 mg via INTRAVENOUS
  Filled 2014-11-19: qty 1

## 2014-11-19 MED ORDER — SODIUM CHLORIDE 0.9 % IV SOLN
1.0000 mg/h | INTRAVENOUS | Status: DC
  Administered 2014-11-19: 3 mg/h via INTRAVENOUS
  Administered 2014-11-19: 2 mg/h via INTRAVENOUS
  Administered 2014-11-19 – 2014-11-20 (×2): 3 mg/h via INTRAVENOUS
  Filled 2014-11-19 (×3): qty 10

## 2014-11-19 MED ORDER — AMIODARONE HCL IN DEXTROSE 360-4.14 MG/200ML-% IV SOLN
60.0000 mg/h | INTRAVENOUS | Status: AC
  Administered 2014-11-19 (×2): 60 mg/h via INTRAVENOUS
  Filled 2014-11-19: qty 200

## 2014-11-19 MED ORDER — SODIUM CHLORIDE 0.9 % IV SOLN
25.0000 ug/h | INTRAVENOUS | Status: DC
  Filled 2014-11-19: qty 50

## 2014-11-19 MED ORDER — HEPARIN (PORCINE) IN NACL 2-0.9 UNIT/ML-% IJ SOLN
INTRAMUSCULAR | Status: AC
  Filled 2014-11-19: qty 1500

## 2014-11-19 MED ORDER — SODIUM CHLORIDE 0.9 % IV BOLUS (SEPSIS)
2000.0000 mL | Freq: Once | INTRAVENOUS | Status: AC
  Administered 2014-11-19: 2000 mL via INTRAVENOUS

## 2014-11-19 MED ORDER — SODIUM CHLORIDE 0.9 % IJ SOLN: 3.0000 mL | INTRAMUSCULAR | Status: DC | PRN

## 2014-11-19 MED ORDER — AMIODARONE HCL IN DEXTROSE 360-4.14 MG/200ML-% IV SOLN
60.0000 mg/h | Freq: Once | INTRAVENOUS | Status: AC
  Administered 2014-11-19: 60 mg/h via INTRAVENOUS
  Filled 2014-11-19: qty 200

## 2014-11-19 MED ORDER — CISATRACURIUM BOLUS VIA INFUSION
0.0500 mg/kg | INTRAVENOUS | Status: DC | PRN
  Filled 2014-11-19: qty 5

## 2014-11-19 MED ORDER — ENOXAPARIN SODIUM 40 MG/0.4ML ~~LOC~~ SOLN
40.0000 mg | SUBCUTANEOUS | Status: DC
  Administered 2014-11-20: 40 mg via SUBCUTANEOUS
  Filled 2014-11-19 (×2): qty 0.4

## 2014-11-19 MED ORDER — FENTANYL CITRATE 0.05 MG/ML IJ SOLN: 100.0000 ug | Freq: Once | INTRAMUSCULAR | Status: DC

## 2014-11-19 MED ORDER — CHLORHEXIDINE GLUCONATE 0.12 % MT SOLN
15.0000 mL | Freq: Two times a day (BID) | OROMUCOSAL | Status: DC
  Administered 2014-11-19 – 2014-11-23 (×9): 15 mL via OROMUCOSAL
  Filled 2014-11-19 (×7): qty 15

## 2014-11-19 MED ORDER — INSULIN ASPART 100 UNIT/ML ~~LOC~~ SOLN
2.0000 [IU] | SUBCUTANEOUS | Status: DC
  Administered 2014-11-19: 4 [IU] via SUBCUTANEOUS
  Administered 2014-11-20 (×4): 2 [IU] via SUBCUTANEOUS

## 2014-11-19 MED ORDER — LIDOCAINE HCL (CARDIAC) 20 MG/ML IV SOLN
INTRAVENOUS | Status: AC
  Filled 2014-11-19: qty 5

## 2014-11-19 MED ORDER — LIDOCAINE HCL (PF) 1 % IJ SOLN
INTRAMUSCULAR | Status: AC
  Filled 2014-11-19: qty 30

## 2014-11-19 MED ORDER — CETYLPYRIDINIUM CHLORIDE 0.05 % MT LIQD
7.0000 mL | Freq: Four times a day (QID) | OROMUCOSAL | Status: DC
  Administered 2014-11-20 – 2014-11-23 (×15): 7 mL via OROMUCOSAL

## 2014-11-19 MED ORDER — HEPARIN (PORCINE) IN NACL 100-0.45 UNIT/ML-% IJ SOLN: 700.0000 [IU]/h | INTRAMUSCULAR | Status: DC

## 2014-11-19 MED ORDER — SODIUM CHLORIDE 0.9 % IV SOLN: 2000.0000 mL | Freq: Once | INTRAVENOUS | Status: AC

## 2014-11-19 NOTE — ED Notes (Signed)
X-ray at bedside

## 2014-11-19 NOTE — ED Notes (Signed)
Activated Code Cool

## 2014-11-19 NOTE — Care Management Note (Signed)
    Page 1 of 1   11/09/2014     3:03:05 PM CARE MANAGEMENT NOTE 11/22/2014  Patient:  [name], [name]   Account Number:  [id number]  Date Initiated:  11/14/2014  Documentation initiated by:  Elissa Hefty  Subjective/Objective Assessment:   adm w cardiac arrest-vent     Action/Plan:   lives at home, has several da's, pcp dr c vandam   Anticipated DC Date:     Anticipated DC Plan:  HOME/SELF CARE         Choice offered to / List presented to:             Status of service:   Medicare Important Message given?   (If response is "NO", the following Medicare IM given date fields will be blank) Date Medicare IM given:   Medicare IM given by:   Date Additional Medicare IM given:   Additional Medicare IM given by:    Discharge Disposition:    Per UR Regulation:  Reviewed for med. necessity/level of care/duration of stay  If discussed at [place] of Stay Meetings, dates discussed:    Comments:

## 2014-11-19 NOTE — Progress Notes (Addendum)
ANTICOAGULATION CONSULT NOTE - Initial Consult  Pharmacy Consult for heparin Indication: atrial fibrillation and stroke  Allergies  Allergen Reactions  . Atorvastatin Other (See Comments)    Patient states that the medication affects her memory    Patient Measurements: Height: 5\' 4"  (162.6 cm) Weight: 220 lb (99.791 kg) IBW/kg (Calculated) : 54.7 Heparin Dosing Weight: 78kg  Vital Signs: Temp: 94.6 F (34.8 C) (03/15 1304) BP: 76/31 mmHg (03/15 1300) Pulse Rate: 83 (03/15 1230)  Labs:  Recent Labs  11/28/2014 1150 11/30/2014 1253  HGB 10.5*  --   HCT 35.6*  --   PLT 170  --   APTT  --  30  LABPROT  --  16.1*  INR  --  1.27  CREATININE 1.45*  --     Estimated Creatinine Clearance: 51.5 mL/min (by C-G formula based on Cr of 1.45).   Medical History: Past Medical History  Diagnosis Date  . Cholecystitis     Gall bladder removed   . Hypercholesterolemia   . Fibroids   . HIV (human immunodeficiency virus infection) 05/27/11    CD4 460, VL undetectable 10/12/12  . Pelvic pain in female 10/14/11  . PMB (postmenopausal bleeding)     Since 2010  . Increased BMI 05/27/11  . Anxiety   . Depression   . Hypertension   . H/O varicella   . History of measles, mumps, or rubella   . Blood transfusion 1995    "related to losing blood in urine during pregnancy"  . Hypothyroidism     Reports h/o hypothyroidism; not on any meds, TSH wnl over several years  . 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 Myocardial infarction 07/2004  . COPD (chronic obstructive pulmonary disease)     well controlled, only using albuterol inhaler occasionally   . Iron deficiency anemia   . Lower GI bleed 04/24/2012    from hemorrhoids. No recent colonoscopy   . CAD in native artery     s/p stent and restenting of LAD. On plavix  . Anginal pain   . Pneumonia 1980's    "once"  . Chronic bronchitis     "get it q yr" (09/14/2013)   . Stroke 12/2011    R insular ischemic CVA ; residual "speech messes up when I get sick or real tired" (09/14/2013)  . Arthritis     "knees; left shoulder; right anklef/foot" (09/14/2013)  . Bursitis, shoulder     "right"  . Chronic lower back pain   . Carpal tunnel syndrome, bilateral     wears slint bilateral  . History of loop recorder    Assessment: 28 yof presented to the ED after a witnessed cardiac arrest requiring shocks and 15-20 minutes of resuscitation. Pt initiated on hypothermia with a target temperature of 33 degrees. Also to start heparin IV. Pt was prescribed apixaban for afib and history of stroke. It is unclear if she was actually taking it though. Family is unable to verify information. Currently working on obtaining PTA med list from various pharmacies. As it was unclear if she was on apixaban or not, will obtain baseline aPTT and heparin level to assist with heparin dosing and timing. Baseline H/H is slightly low and platelets are WNL.    Goal of Therapy:  Heparin level 0.3-0.7 units/ml Monitor platelets by anticoagulation protocol: Yes   Plan:  - Heparin level and aPTT now - Depending on  lab results, will enter a heparin gtt order +/- heparin bolus - Check a 6 hour heparin level after drip started - Daily heparin level and CBC  Rumbarger, Rande Lawman 11/28/2014,1:34 PM  Addendum: Heparin level is <0.10 (undetectable) so likely was not taking apixaban PTA.   Plan: Start heparin at 2500 unit bolus IV x1 then 700 units/hr.  Recheck heparin level in 6 hours.  Daily heparin level and CBC while on therapy.   Sloan Leiter, PharmD, BCPS Clinical Pharmacist [phone] 11/28/2014, 4:02 PM

## 2014-11-19 NOTE — Code Documentation (Signed)
Ice bags applied  

## 2014-11-19 NOTE — Consult Note (Signed)
[facility]  Total days of antibiotics        Reason for Consult: cardiac arrest in hiv patient    Referring Physician: yacoub  Active Problems:   Cardiac arrest   Acute respiratory failure   Anoxic encephalopathy   Cardiogenic shock    HPI: [name] is a 54 y.o. female with hiv disease, cocaine abuse, hx of cva,history of CAD s/p stent In LAD s/p PCI in 2008(instent restenosis?, CHF, osteomyelitis of right tibia/fibula in 2015, CD 4 count of 380 (32%)/ VL 207 (dec 2015) she had been on stribild at that time (but found to only be partially active based upon her genotype: K103N,D67N, K70R, M184V, T215F, K219E). She was changed to truvada-intelence-DRVc regimen in early December by Dr. Tommy Medal, but has not been seen in the clinic since that visit. SHe had a v-fib arrest win the community.  she was shocked x6 and underwent 15-20 minutes of CPR before ROSC. She was started on hypothermia protocol. ID consulted to determine what to do about her ART regimen. Underwent heart catheterization in this afternoon.  Past Medical History  Diagnosis Date  . Cholecystitis     Gall bladder removed   . Hypercholesterolemia   . Fibroids   . HIV (human immunodeficiency virus infection) 05/27/11    CD4 460, VL undetectable 10/12/12  . Pelvic pain in female 10/14/11  . PMB (postmenopausal bleeding)     Since 2010  . Increased BMI 05/27/11  . Anxiety   . Depression   . Hypertension   . H/O varicella   . History of measles, mumps, or rubella   . Blood transfusion 1995    "related to losing blood in urine during pregnancy"  . Hypothyroidism     Reports h/o hypothyroidism; not on any meds, TSH wnl over several years  . 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 Myocardial infarction 07/2004  . COPD (chronic obstructive pulmonary disease)     well controlled, only using albuterol inhaler occasionally     . Iron deficiency anemia   . Lower GI bleed 04/24/2012    from hemorrhoids. No recent colonoscopy   . CAD in native artery     s/p stent and restenting of LAD. On plavix  . Anginal pain   . Pneumonia 1980's    "once"  . Chronic bronchitis     "get it q yr" (09/14/2013)  . Stroke 12/2011    R insular ischemic CVA ; residual "speech messes up when I get sick or real tired" (09/14/2013)  . Arthritis     "knees; left shoulder; right anklef/foot" (09/14/2013)  . Bursitis, shoulder     "right"  . Chronic lower back pain   . Carpal tunnel syndrome, bilateral     wears slint bilateral  . History of loop recorder     Allergies:  Allergies  Allergen Reactions  . Atorvastatin Other (See Comments)    Other reaction(s): Other (See Comments) Problems with memory  Patient states that the medication affects her memory    MEDICATIONS: . artificial tears  1 application Both Eyes 3 times per day  . aspirin  300 mg Rectal NOW  . budesonide  0.5 mg Nebulization BID  . fentaNYL  100 mcg Intravenous Once  . heparin  2,500 Units Intravenous Once  . insulin aspart  2-6 Units Subcutaneous 6 times per day  .  ipratropium-albuterol  3 mL Nebulization Q6H  . lidocaine (cardiac) 100 mg/64m      . midazolam  2 mg Intravenous Once  . pantoprazole (PROTONIX) IV  40 mg Intravenous QHS  . potassium chloride      . rocuronium        History  Substance Use Topics  . Smoking status: Former Smoker -- 0.50 packs/day for 29 years    Types: Cigarettes    Quit date: 05/07/2006  . Smokeless tobacco: Never Used  . Alcohol Use: 0.0 oz/week     Comment: Occasional drinker. Wine    Family History  Problem Relation Age of Onset  . Hypertension Mother   . Hyperlipidemia Mother   . Obesity Mother   . Heart disease Mother   . Hypertension Maternal Aunt   . Hyperlipidemia Maternal Aunt   . Obesity Maternal Aunt   . Stroke Maternal Aunt     Review of Systems -  Unable to obtain due to  sedation/hypothermia OBJECTIVE: Temp:  [93 F (33.9 C)-96.6 F (35.9 C)] 93 F (33.9 C) (03/15 1600) Pulse Rate:  [83-102] 84 (03/15 1545) Resp:  [14-25] 18 (03/15 1615) BP: (70-135)/(30-100) 100/30 mmHg (03/15 1615) SpO2:  [48 %-100 %] 100 % (03/15 1600) Arterial Line BP: (96-163)/(56-115) 132/91 mmHg (03/15 1615) FiO2 (%):  [40 %-50 %] 40 % (03/15 1600) Weight:  [220 lb (99.791 kg)-220 lb 7.4 oz (100 kg)] 220 lb (99.791 kg) (03/15 1230) Physical Exam  Constitutional:  Intubated, sedated. appears well-developed and well-nourished. No distress.  HENT: ETT in place Neck = left ij in place Cardiovascular: Normal rate, regular rhythm and normal heart sounds. Exam reveals no gallop and no friction rub.  No murmur heard.  Pulmonary/Chest: Effort normal and breath sounds normal. No respiratory distress.  has no wheezes.  Abdominal: Soft. Bowel sounds are decreased.  exhibits no distension. There is no tenderness.wearing cooling vest Neurological: sedated  Skin: Skin is cool upper extremities (as expected for hypothermia protocol)  LABS: Results for orders placed or performed during the hospital encounter of 12/05/2014 (from the past 48 hour(s))  CBC with Differential     Status: Abnormal   Collection Time: 11/30/2014 11:50 AM  Result Value Ref Range   WBC 13.4 (H) 4.0 - 10.5 K/uL   RBC 4.16 3.87 - 5.11 MIL/uL   Hemoglobin 10.5 (L) 12.0 - 15.0 g/dL   HCT 35.6 (L) 36.0 - 46.0 %   MCV 85.6 78.0 - 100.0 fL   MCH 25.2 (L) 26.0 - 34.0 pg   MCHC 29.5 (L) 30.0 - 36.0 g/dL   RDW 15.1 11.5 - 15.5 %   Platelets 170 150 - 400 K/uL   Neutrophils Relative % 40 (L) 43 - 77 %   Lymphocytes Relative 51 (H) 12 - 46 %   Monocytes Relative 7 3 - 12 %   Eosinophils Relative 1 0 - 5 %   Basophils Relative 1 0 - 1 %   Neutro Abs 5.4 1.7 - 7.7 K/uL   Lymphs Abs 6.9 (H) 0.7 - 4.0 K/uL   Monocytes Absolute 0.9 0.1 - 1.0 K/uL   Eosinophils Absolute 0.1 0.0 - 0.7 K/uL   Basophils Absolute 0.1 0.0 - 0.1 K/uL    RBC Morphology POLYCHROMASIA PRESENT    WBC Morphology ATYPICAL LYMPHOCYTES   Comprehensive metabolic panel     Status: Abnormal   Collection Time: 11/30/2014 11:50 AM  Result Value Ref Range   Sodium 141 135 - 145 mmol/L  Potassium 3.5 3.5 - 5.1 mmol/L   Chloride 108 96 - 112 mmol/L   CO2 16 (L) 19 - 32 mmol/L   Glucose, Bld 316 (H) 70 - 99 mg/dL   BUN 8 6 - 23 mg/dL   Creatinine, Ser 1.45 (H) 0.50 - 1.10 mg/dL   Calcium 8.2 (L) 8.4 - 10.5 mg/dL   Total Protein 6.6 6.0 - 8.3 g/dL   Albumin 2.7 (L) 3.5 - 5.2 g/dL   AST 106 (H) 0 - 37 U/L   ALT 52 (H) 0 - 35 U/L   Alkaline Phosphatase 70 39 - 117 U/L   Total Bilirubin 0.6 0.3 - 1.2 mg/dL   GFR calc non Af Amer 40 (L) >90 mL/min   GFR calc Af Amer 47 (L) >90 mL/min    Comment: (NOTE) The eGFR has been calculated using the CKD EPI equation. This calculation has not been validated in all clinical situations. eGFR's persistently <90 mL/min signify possible Chronic Kidney Disease.    Anion gap 17 (H) 5 - 15  I-Stat Troponin, ED (not at [facility])     Status: None   Collection Time: 11/07/2014 12:09 PM  Result Value Ref Range   Troponin i, poc 0.02 0.00 - 0.08 ng/mL   Comment 3            Comment: Due to the release kinetics of cTnI, a negative result within the first hours of the onset of symptoms does not rule out myocardial infarction with certainty. If myocardial infarction is still suspected, repeat the test at appropriate intervals.   I-Stat CG4 Lactic Acid, ED     Status: Abnormal   Collection Time: 11/25/2014 12:11 PM  Result Value Ref Range   Lactic Acid, Venous 13.58 (HH) 0.5 - 2.0 mmol/L   Comment NOTIFIED PHYSICIAN   Troponin I     Status: Abnormal   Collection Time: 11/17/2014 12:53 PM  Result Value Ref Range   Troponin I 0.11 (H) <0.031 ng/mL    Comment:        PERSISTENTLY INCREASED TROPONIN VALUES IN THE RANGE OF 0.04-0.49 ng/mL CAN BE SEEN IN:       -UNSTABLE ANGINA       -CONGESTIVE HEART FAILURE        -MYOCARDITIS       -CHEST TRAUMA       -ARRYHTHMIAS       -LATE PRESENTING MYOCARDIAL INFARCTION       -COPD   CLINICAL FOLLOW-UP RECOMMENDED.   Basic metabolic panel     Status: Abnormal   Collection Time: 12/01/2014 12:53 PM  Result Value Ref Range   Sodium 144 135 - 145 mmol/L   Potassium 3.4 (L) 3.5 - 5.1 mmol/L   Chloride 116 (H) 96 - 112 mmol/L   CO2 15 (L) 19 - 32 mmol/L   Glucose, Bld 228 (H) 70 - 99 mg/dL   BUN 9 6 - 23 mg/dL   Creatinine, Ser 1.28 (H) 0.50 - 1.10 mg/dL   Calcium 7.1 (L) 8.4 - 10.5 mg/dL   GFR calc non Af Amer 47 (L) >90 mL/min   GFR calc Af Amer 54 (L) >90 mL/min    Comment: (NOTE) The eGFR has been calculated using the CKD EPI equation. This calculation has not been validated in all clinical situations. eGFR's persistently <90 mL/min signify possible Chronic Kidney Disease.    Anion gap 13 5 - 15  Protime-INR now and repeat in 8 hours     Status: Abnormal  Collection Time: 11/14/2014 12:53 PM  Result Value Ref Range   Prothrombin Time 16.1 (H) 11.6 - 15.2 seconds   INR 1.27 0.00 - 1.49  APTT now and repeat in 8 hours     Status: None   Collection Time: 11/21/2014 12:53 PM  Result Value Ref Range   aPTT 30 24 - 37 seconds  TSH     Status: Abnormal   Collection Time: 11/10/2014 12:53 PM  Result Value Ref Range   TSH 6.384 (H) 0.350 - 4.500 uIU/mL  I-Stat arterial blood gas, ED     Status: Abnormal   Collection Time: 11/05/2014  1:02 PM  Result Value Ref Range   pH, Arterial 7.173 (LL) 7.350 - 7.450   pCO2 arterial 50.0 (H) 35.0 - 45.0 mmHg   pO2, Arterial 397.0 (H) 80.0 - 100.0 mmHg   Bicarbonate 18.4 (L) 20.0 - 24.0 mEq/L   TCO2 20 0 - 100 mmol/L   O2 Saturation 100.0 %   Acid-base deficit 10.0 (H) 0.0 - 2.0 mmol/L   Collection site RADIAL, ALLEN'S TEST ACCEPTABLE    Drawn by RT    Sample type ARTERIAL    Comment NOTIFIED PHYSICIAN   Pregnancy, urine (patients in the Emergency Dept.)     Status: None   Collection Time: 11/26/2014  2:27 PM   Result Value Ref Range   Preg Test, Ur NEGATIVE NEGATIVE    Comment:        THE SENSITIVITY OF THIS METHODOLOGY IS >20 mIU/mL.   Urine rapid drug screen (hosp performed)     Status: Abnormal   Collection Time: 11/27/2014  2:27 PM  Result Value Ref Range   Opiates NONE DETECTED NONE DETECTED   Cocaine POSITIVE (A) NONE DETECTED   Benzodiazepines POSITIVE (A) NONE DETECTED   Amphetamines NONE DETECTED NONE DETECTED   Tetrahydrocannabinol NONE DETECTED NONE DETECTED   Barbiturates NONE DETECTED NONE DETECTED    Comment:        DRUG SCREEN FOR MEDICAL PURPOSES ONLY.  IF CONFIRMATION IS NEEDED FOR ANY PURPOSE, NOTIFY LAB WITHIN 5 DAYS.        LOWEST DETECTABLE LIMITS FOR URINE DRUG SCREEN Drug Class       Cutoff (ng/mL) Amphetamine      1000 Barbiturate      200 Benzodiazepine   941 Tricyclics       740 Opiates          300 Cocaine          300 THC              50   Basic metabolic panel     Status: Abnormal   Collection Time: 12/02/2014  2:35 PM  Result Value Ref Range   Sodium 143 135 - 145 mmol/L   Potassium 3.3 (L) 3.5 - 5.1 mmol/L   Chloride 117 (H) 96 - 112 mmol/L   CO2 19 19 - 32 mmol/L   Glucose, Bld 139 (H) 70 - 99 mg/dL   BUN 12 6 - 23 mg/dL   Creatinine, Ser 1.21 (H) 0.50 - 1.10 mg/dL   Calcium 7.5 (L) 8.4 - 10.5 mg/dL   GFR calc non Af Amer 50 (L) >90 mL/min   GFR calc Af Amer 58 (L) >90 mL/min    Comment: (NOTE) The eGFR has been calculated using the CKD EPI equation. This calculation has not been validated in all clinical situations. eGFR's persistently <90 mL/min signify possible Chronic Kidney Disease.    Anion gap 7  5 - 15  Heparin level (unfractionated)     Status: Abnormal   Collection Time: 12/01/2014  2:35 PM  Result Value Ref Range   Heparin Unfractionated <0.10 (L) 0.30 - 0.70 IU/mL    Comment:        IF HEPARIN RESULTS ARE BELOW EXPECTED VALUES, AND PATIENT DOSAGE HAS BEEN CONFIRMED, SUGGEST FOLLOW UP TESTING OF ANTITHROMBIN III LEVELS.    MRSA PCR Screening     Status: None   Collection Time: 11/07/2014  2:36 PM  Result Value Ref Range   MRSA by PCR NEGATIVE NEGATIVE    Comment:        The GeneXpert MRSA Assay (FDA approved for NASAL specimens only), is one component of a comprehensive MRSA colonization surveillance program. It is not intended to diagnose MRSA infection nor to guide or monitor treatment for MRSA infections.   Glucose, capillary     Status: Abnormal   Collection Time: 11/13/2014  4:11 PM  Result Value Ref Range   Glucose-Capillary 171 (H) 70 - 99 mg/dL   Comment 1 Arterial Specimen   I-STAT, chem 8     Status: Abnormal   Collection Time: 11/16/2014  4:14 PM  Result Value Ref Range   Sodium 149 (H) 135 - 145 mmol/L   Potassium 2.8 (L) 3.5 - 5.1 mmol/L   Chloride 112 96 - 112 mmol/L   BUN 12 6 - 23 mg/dL   Creatinine, Ser 1.00 0.50 - 1.10 mg/dL   Glucose, Bld 184 (H) 70 - 99 mg/dL   Calcium, Ion 1.10 (L) 1.12 - 1.23 mmol/L   TCO2 15 0 - 100 mmol/L   Hemoglobin 13.9 12.0 - 15.0 g/dL   HCT 41.0 36.0 - 46.0 %    MICRO:  IMAGING: Ct Head Wo Contrast  11/18/2014   CLINICAL DATA:  Cardiac arrest.  Intracranial hemorrhage.  EXAM: CT HEAD WITHOUT CONTRAST  TECHNIQUE: Contiguous axial images were obtained from the base of the skull through the vertex without intravenous contrast.  COMPARISON:  MRI 05/30/2014, CT 05/30/2014  FINDINGS: Chronic left frontal infarct. Chronic infarct right cerebellum. Hypodensity centrum semiovale on the right is unchanged and consistent with chronic ischemia.  Negative for acute infarct. Negative for hemorrhage or mass. Ventricle size is normal.  Calvarium intact.  Scleral banding right eye  IMPRESSION: Chronic ischemic changes as above. No acute abnormality. No intracranial hemorrhage.   Electronically Signed   By: Franchot Gallo M.D.   On: 11/09/2014 14:28   Dg Chest Portable 1 View  11/09/2014   CLINICAL DATA:  Check central line placement  EXAM: PORTABLE CHEST - 1 VIEW   COMPARISON:  11/07/2014  FINDINGS: Endotracheal tube, nasogastric catheter and new left jugular central line are within normal limits. The central line tip is noted at the cavoatrial junction. No pneumothorax is seen. Monitoring device is again noted. The inspiratory effort is stable. Increasing congestion and mild interstitial edema is noted.  IMPRESSION: Tubes and lines as described.  Increasing vascular congestion and interstitial edema.   Electronically Signed   By: Inez Catalina M.D.   On: 11/09/2014 12:53   Dg Chest Portable 1 View  12/05/2014   CLINICAL DATA:  Post cardiac arrest. CP are performed. Seizure prior to arrest.  EXAM: PORTABLE CHEST - 1 VIEW  COMPARISON:  05/30/2014  FINDINGS: Endotracheal tube is in place with tip approximately 2.4 cm above carina. Nasogastric tube is in place with tip beyond the imaged but beyond the gastroesophageal junction. The  Shallow  lung inflation. Heart is enlarged. There is mild pulmonary vascular congestion without overt edema. No pneumothorax. No acute displaced fractures identified.  IMPRESSION: Mild cardiomegaly.  Pulmonary vascular congestion.  Endotracheal Tube and nasogastric tube placed.   Electronically Signed   By: Nolon Nations M.D.   On: 11/26/2014 12:27    HISTORICAL MICRO/IMAGING [employer] genotype database shows:   Drug Resistance Interpretation: PR PI Major Resistance Mutations:None PI Minor Resistance Mutations:None Other Mutations:L63P Protease Inhibitors atazanavir/r (ATV/r)Susceptible darunavir/r (DRV/r)Susceptible fosamprenavir/r (FPV/r)Susceptible indinavir/r (IDV/r)Susceptible lopinavir/r (LPV/r)Susceptible nelfinavir (NFV)Susceptible saquinavir/r (SQV/r)Susceptible tipranavir/r (TPV/r)Susceptible PR Comments  Drug Resistance Interpretation: RT NRTI Resistance Mutations:D67N, K70R, M184V, T215F, K219E NNRTI Resistance  Mutations:K103N Other Mutations:None Nucleoside RTI lamivudine (3TC)High-level resistance abacavir (ABC)High-level resistance zidovudine (AZT)High-level resistance stavudine (D4T)High-level resistance didanosine (DDI)Intermediate resistance emtricitabine (FTC)High-level resistance tenofovir (TDF)Intermediate resistance  Assessment/Plan:  54yo F with well controlled hiv disease, CAD, substance abuse having cardiac arrest  hiv disease= her regimen can be crushed and given through an og. intellence would need to be diluted in small amount of water, then given in og tube. Will start regimen tomorrow.  Lactic acidosis = due to poor tissue perfusion for cardiac arrest.   Cardiac arrest = will assess anoxic brain injury after hypothermia protocol is completed, but may have some information after initial eeg is read.  Positive drug screen = unclear if cocaine use contributed to cardiac arrest  Sinjin Amero [facility] [phone]

## 2014-11-19 NOTE — Progress Notes (Signed)
EEG Completed; Results Pending  

## 2014-11-19 NOTE — ED Notes (Signed)
Contacted Bed control regarding delay in placing bed request.

## 2014-11-19 NOTE — ED Notes (Addendum)
Daughter, Fredirick Maudlin, at bedside with chaplain. Dr. Jeneen Rinks updated daughter on plan of care. All belongings given to daughter.

## 2014-11-19 NOTE — H&P (Signed)
PULMONARY / CRITICAL CARE MEDICINE   Name: [name] MRN: [id number] DOB: 11-Sep-1960    ADMISSION DATE:  11/08/2014 CONSULTATION DATE:  12/04/2014  REFERRING MD :  EDP  CHIEF COMPLAINT:  Cardiac Arrest  INITIAL PRESENTATION:  54 y.o. F brought to [facility] ED 3/15 after suffering VF cardiac arrest while at her bank.  She was shocked a total of 6 times with roughly 15 - 20 minutes of resuscitation prior to ROSC.  In ED she remained unresponsive and PCCM consulted for consideration of hypothermia.     STUDIES:  CXR 3/15 >>> mild cardiomegaly, vascular congestion. CT Head 3/15 >>>  SIGNIFICANT EVENTS: 3/15 - admitted after cardiac arrest   HISTORY OF PRESENT ILLNESS:  Pt is encephalopathic; therefore, this HPI is obtained from chart review. [name] is a 54 y.o. F with PMH as outlined below which includes known HIV.  She was in her USOH until 3/15 when she collapsed and had a witnessed cardiac arrest while at her bank that morning.  EMS was dispatched and upon their arrival, pt noted to be in VF.  She was shocked 3 times with rhythm then converting to VT.  Shocked twice more with return to VF.  Shocked a sixth time before return to NSR and ROSC.  Total ACLS time roughly 15 - 20 minutes. On arrival to ED, she remained unresponsive and was intubated for respiratory insufficiency.  PCCM contacted for consideration of hypothermia.  HIV followed by Dr. Tommy Medal.  Per his last note 08/07/14, pt was previously controlled on STRIBILD but was only taking it 1/2 of the time she was supposed to due to severe foot pain (had extensive workup with Dr. Doran Durand in ortho with initial MRI concerning for osteo; however, biopsies returning as negative / benign bone).  Med regimen deemed inappropriate by Dr. Tommy Medal so pt switched to Prezcobix and Truvada with Intelence 400mg  daily.  PAST MEDICAL HISTORY :   has a past medical history of Cholecystitis; Hypercholesterolemia; Fibroids; HIV (human  immunodeficiency virus infection) (05/27/11); Pelvic pain in female (10/14/11); PMB (postmenopausal bleeding); Increased BMI (05/27/11); Anxiety; Depression; Hypertension; H/O varicella; History of measles, mumps, or rubella; Blood transfusion (1995); Hypothyroidism; CHF (congestive heart failure); Myocardial infarction (07/2004); COPD (chronic obstructive pulmonary disease); Iron deficiency anemia; Lower GI bleed (04/24/2012); CAD in native artery; Anginal pain; Pneumonia (1980's); Chronic bronchitis; Stroke (12/2011); Arthritis; Bursitis, shoulder; Chronic lower back pain; Carpal tunnel syndrome, bilateral; and History of loop recorder.  has past surgical history that includes Cesarean section (1990; 1995); Retinal laser procedure (Right, 1993); Eye surgery; LEEP (2012); TEE without cardioversion (12/21/2011); Coronary angioplasty with stent (07/2004; 04/2007); Cardiac catheterization (07/2007); I&D extremity (07/29/2012); Knee arthroscopy (Right, ~ 2012); Cholecystectomy (1990's); Knee arthroscopy (Left, ~ 2006); Loop recorder implant (09/18/2013); Synovial biopsy (Right, 11/15/2013); Colonoscopy; I&D extremity (Right, 03/28/2014); and loop recorder implant (N/A, 09/18/2013). Prior to Admission medications   Medication Sig Start Date End Date Taking? Authorizing Provider  albuterol (PROVENTIL HFA;VENTOLIN HFA) 108 (90 BASE) MCG/ACT inhaler Inhale 2 puffs into the lungs every 6 (six) hours as needed for wheezing or shortness of breath. 07/03/14   Bartholomew Crews, MD  albuterol (PROVENTIL) (2.5 MG/3ML) 0.083% nebulizer solution Take 3 mLs (2.5 mg total) by nebulization every 6 (six) hours as needed for wheezing or shortness of breath. Use MDI first. Use neb if unable to use MDI. 07/03/14   Bartholomew Crews, MD  apixaban (ELIQUIS) 5 MG TABS tablet Take 1 tablet (5 mg total)  by mouth 2 (two) times daily. 09/11/14   Thompson Grayer, MD  aspirin 81 MG EC tablet Take 1 tablet (81 mg total) by mouth daily. 07/03/14    Bartholomew Crews, MD  beclomethasone (QVAR) 80 MCG/ACT inhaler Inhale 2 puffs into the lungs 2 (two) times daily. 07/03/14   Bartholomew Crews, MD  carvedilol (COREG) 3.125 MG tablet TAKE 1 TABLET (3.125MG ) BY MOUTH TWICE DAILY (AT 9AM AND 6PM) WITH A MEAL -TAKE WITH FOOD 10/01/14   Bartholomew Crews, MD  darunavir-cobicistat (PREZCOBIX) 800-150 MG per tablet Take 1 tablet by mouth daily. 08/07/14   Truman Hayward, MD  diphenoxylate-atropine (LOMOTIL) 2.5-0.025 MG per tablet Take 1 tablet by mouth 4 (four) times daily as needed for diarrhea or loose stools. Patient not taking: Reported on 08/07/2014 07/03/14   Bartholomew Crews, MD  emtricitabine-tenofovir (TRUVADA) 200-300 MG per tablet Take 1 tablet by mouth daily. 08/07/14   Truman Hayward, MD  Etravirine (INTELENCE) 200 MG TABS Take 2 tablets (400 mg total) by mouth daily. 08/07/14   Truman Hayward, MD  ferrous sulfate 325 (65 FE) MG tablet Take 1 tablet (325 mg total) by mouth daily with breakfast. 05/09/14   Bartholomew Crews, MD  gabapentin (NEURONTIN) 400 MG capsule TAKE 1 CAPSULE (400MG ) BY MOUTH 3 TIMES A DAY -MAY CAUSE DROWSINESS 10/01/14   Bartholomew Crews, MD  hydrochlorothiazide (HYDRODIURIL) 25 MG tablet TAKE 1 TABLET (25MG ) BY MOUTH DAILY -AVOID EXCESSIVE SUNLIGHT 10/01/14   Bartholomew Crews, MD  isosorbide mononitrate (IMDUR) 60 MG 24 hr tablet TAKE 1 TABLET (60MG ) BY MOUTH EVERY EVENING AT 5PM -SWALLOW WHOLE DO NOT CRUSH 10/01/14   Bartholomew Crews, MD  lisinopril (PRINIVIL,ZESTRIL) 20 MG tablet Take 1 tablet (20 mg total) by mouth daily. 07/03/14 07/03/15  Bartholomew Crews, MD  lubiprostone (AMITIZA) 24 MCG capsule Take 1 capsule (24 mcg total) by mouth at bedtime as needed for constipation. 07/03/14   Bartholomew Crews, MD  morphine (MS CONTIN) 30 MG 12 hr tablet Take 30 mg by mouth every 12 (twelve) hours.    Historical Provider, MD  Multiple Vitamin (MULTIVITAMIN WITH MINERALS) TABS Take 1 tablet by  mouth daily.    Historical Provider, MD  nitroGLYCERIN (NITROSTAT) 0.4 MG SL tablet Place 0.4 mg under the tongue every 5 (five) minutes as needed for chest pain.    Historical Provider, MD  ondansetron (ZOFRAN) 4 MG tablet TAKE 1 TABLET (4MG  TOTAL) BY MOUTH EVERY 8 HOURS AS NEEDED FOR NAUSEAOR VOMITING. 10/01/14   Truman Hayward, MD  oxyCODONE-acetaminophen (PERCOCET) 10-325 MG per tablet Take 1 tablet by mouth every 6 (six) hours as needed for pain (for breakthrough pain).    Historical Provider, MD  pantoprazole (PROTONIX) 20 MG tablet TAKE 2 TABLETS (40MG ) BY  MOUTH EVERY DAY 10/01/14   Thayer Headings, MD  rosuvastatin (CRESTOR) 40 MG tablet Take 1 tablet (40 mg total) by mouth daily at 6 PM. 07/03/14   Bartholomew Crews, MD  traZODone (DESYREL) 50 MG tablet Take 1 tablet (50 mg total) by mouth at bedtime. 07/03/14   Bartholomew Crews, MD  valACYclovir (VALTREX) 500 MG tablet Take 1 tablet (500 mg total) by mouth 2 (two) times daily. For three days for outbreaks. 10/30/14   Bartholomew Crews, MD  zolpidem (AMBIEN) 5 MG tablet Take 1 tablet (5 mg total) by mouth at bedtime as needed for sleep. 07/03/14   Real Cons  Lynnae January, MD   Allergies  Allergen Reactions  . Atorvastatin Other (See Comments)    Patient states that the medication affects her memory    FAMILY HISTORY:  Family History  Problem Relation Age of Onset  . Hypertension Mother   . Hyperlipidemia Mother   . Obesity Mother   . Heart disease Mother   . Hypertension Maternal Aunt   . Hyperlipidemia Maternal Aunt   . Obesity Maternal Aunt   . Stroke Maternal Aunt     SOCIAL HISTORY:  reports that she quit smoking about 8 years ago. Her smoking use included Cigarettes. She has a 14.5 pack-year smoking history. She has never used smokeless tobacco. She reports that she drinks alcohol. She reports that she does not use illicit drugs.  REVIEW OF SYSTEMS:  Unable to complete as pt encephalopathic.  SUBJECTIVE:   VITAL  SIGNS: Temp:  [96.4 F (35.8 C)-96.6 F (35.9 C)] 96.4 F (35.8 C) (03/15 1230) Pulse Rate:  [83-102] 83 (03/15 1230) Resp:  [20-25] 25 (03/15 1230) BP: (85-109)/(51-72) 90/51 mmHg (03/15 1230) SpO2:  [48 %-100 %] 100 % (03/15 1230) Weight:  [99.791 kg (220 lb)-100 kg (220 lb 7.4 oz)] 99.791 kg (220 lb) (03/15 1230) HEMODYNAMICS:   VENTILATOR SETTINGS:   INTAKE / OUTPUT: Intake/Output      03/14 0701 - 03/15 0700 03/15 0701 - 03/16 0700   I.V. (mL/kg)  2000 (20)   Total Intake(mL/kg)  2000 (20)   Net   +2000          PHYSICAL EXAMINATION: General: Obese female, unresponsive. Neuro: Remains non-responsive.  GCS 3. HEENT: [place]/AT. PERRL, sclerae anicteric. Cardiovascular: RRR, no M/R/G.  Lungs: Respirations even and unlabored.  Coarse bilaterally L > R. Abdomen: BS x 4, soft, NT/ND.  Musculoskeletal: No gross deformities, no edema.  Skin: Intact, warm, no rashes.  LABS:  CBC  Recent Labs Lab 11/07/2014 1150  WBC 13.4*  HGB 10.5*  HCT 35.6*  PLT 170   Coag's No results for input(s): APTT, INR in the last 168 hours. BMET No results for input(s): NA, K, CL, CO2, BUN, CREATININE, GLUCOSE in the last 168 hours. Electrolytes No results for input(s): CALCIUM, MG, PHOS in the last 168 hours. Sepsis Markers  Recent Labs Lab 12/02/2014 1211  LATICACIDVEN 13.58*   ABG No results for input(s): PHART, PCO2ART, PO2ART in the last 168 hours. Liver Enzymes No results for input(s): AST, ALT, ALKPHOS, BILITOT, ALBUMIN in the last 168 hours. Cardiac Enzymes No results for input(s): TROPONINI, PROBNP in the last 168 hours. Glucose No results for input(s): GLUCAP in the last 168 hours.  Imaging Dg Chest Portable 1 View  11/22/2014   CLINICAL DATA:  Post cardiac arrest. CP are performed. Seizure prior to arrest.  EXAM: PORTABLE CHEST - 1 VIEW  COMPARISON:  05/30/2014  FINDINGS: Endotracheal tube is in place with tip approximately 2.4 cm above carina. Nasogastric tube is in  place with tip beyond the imaged but beyond the gastroesophageal junction. The  Shallow lung inflation. Heart is enlarged. There is mild pulmonary vascular congestion without overt edema. No pneumothorax. No acute displaced fractures identified.  IMPRESSION: Mild cardiomegaly.  Pulmonary vascular congestion.  Endotracheal Tube and nasogastric tube placed.   Electronically Signed   By: Nolon Nations M.D.   On: 11/21/2014 12:27    ASSESSMENT / PLAN:  PULMONARY OETT 3/15 >>> A: Acute respiratory failure following cardiac arrest COPD without evidence of exacerbation P:   Full mechanical support, 8cc/kg. VAP  bundle. Hold SBT while undergoing hypothermia protocol. DuoNebs / Albuterol / Budesonide in lieu of outpatient beclomethasone. ABG and CXR in AM.  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 Goal CVP 8 - 12. Trend troponin / lactate. Cortisol. TTE. Avoid beta blockers for now until UDS back. Heparin gtt in lieu of outpatient eliquis. Hold outpatient apixaban, carvedilol, HCTZ, imdur, lisinopril, rosuvastatin.  RENAL A:   AGMA - lactate AKI At risk for metabolic derangements due to cold diuresis during hypothermia  Pseudohypocalcemia P:   NS @ 125. BMP q2hrs during hypothermia protocol. Trend lactate.  GASTROINTESTINAL A:   Mild transaminitis GI prophylaxis Nutrition P:   LFT's in AM. SUP: Pantoprazole. NPO. Nutrition consult for TF's.  HEMATOLOGIC A:   Anemia VTE Prophylaxis P:  Transfuse per usual ICU guidelines. SCD's / Heparin gtt. Coags q8hrs during hypothermia protocol. CBC in AM.  INFECTIOUS A:   Leukocytosis - no indication of infection at this point, likely acute phase reactant HIV - followed by Dr. Tommy Medal P:    Monitor clinically. ID consulted.  ENDOCRINE A:   Anticipate hyperglycemia during hypothermia protocol Hypothyroidism - not on outpatient meds P:   ICU hyperglycemia protocol. TSH.  NEUROLOGIC A:   Acute anoxic encephalopathy Hx CVA, Depression, chronic lower back pain Positive UDS in past P:   Sedation:  Nimbex / Fentanyl gtt / Midazolam gtt. RASS goal: -5 during hypothermia protocol. Hold daily WUA until rewarming phase. EEG. Hold outpatient ambien, trazodone, percocet, morphine, gabapentin.  Family updated: None.  Interdisciplinary Family Meeting v Palliative Care Meeting:  Due by: 3/21.  Montey Hora, [facility] Pager: [phone]  or [phone]  Out of the hospital cardiac arrest, 11 minutes downtime, shockable rhythem, head CT with no signs of bleeding, no signs of infection or coagulopathy.  Will proceed with hypothermia after placement of a TLC.  Cardiology called to evaluate patient by EDP.  Will admit to the ICU and begin hypothermia protocol.  She remains unresponsive off sedation and paralytics.  The patient is critically ill with multiple organ systems failure and requires high complexity decision making for assessment and support, frequent evaluation and titration of therapies, application of advanced monitoring technologies and extensive interpretation of multiple databases.   Critical Care Time devoted to patient care services described in this note is  45  Minutes. This time reflects time of care of this signee Dr Jennet Maduro. This critical care time does not reflect procedure time, or teaching time or supervisory time of PA/NP/Med student/Med Resident etc but could involve care discussion time.  Rush Farmer, M.D. [facility] Pulmonary/Critical Care Medicine. Pager: [phone]. After hours pager: [phone].  12/02/2014, 12:38 PM

## 2014-11-19 NOTE — ED Notes (Signed)
Xray at bedside for repeat to verify central line.
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
## 2014-11-19 NOTE — ED Notes (Signed)
Xray notified of need for CXR.

## 2014-11-19 NOTE — ED Notes (Signed)
Placed size 16 french temp foley into patient no urine in return [place] and [place] verified that foley was in the right place

## 2014-11-19 NOTE — ED Notes (Signed)
Lab results reported to Dr.James. 
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
## 2014-11-19 NOTE — CV Procedure (Signed)
   Cardiac Catheterization Procedure Note  Name: [name] MRN: [id number] DOB: 1961/07/16  Procedure: Left Heart Cath, Selective Coronary Angiography  Indication: 54 yo BF with history of ischemic cardiomyopathy and known occlusion of the LAD in 2008 presents with out of hospital cardiac arrest.    Procedural details: The right groin was prepped, draped, and anesthetized with 1% lidocaine. Using modified Seldinger technique, a 5 French sheath was introduced into the right femoral artery. Standard Judkins catheters were used for coronary angiography and left ventricular pressures. Catheter exchanges were performed over a guidewire. There were no immediate procedural complications. The patient was transferred to the post catheterization recovery area for further monitoring.  Procedural Findings: Hemodynamics:  AO 130/88 mean 111 mm Hg LV 127/38 mm Hg   Coronary angiography: Coronary dominance: right  Left mainstem: Normal  Left anterior descending (LAD): The LAD has a stent after the first diagonal. It is occluded. There is recanalization into the second diagonal. The LAD fills by right to left collaterals. The first diagonal is normal.   Left circumflex (LCx): Normal  Right coronary artery (RCA): 30% proximal stenosis.   Left ventriculography: not done due to high EDP  Final Conclusions:   1. Single vessel occlusive CAD. 2. High LV EDP  Recommendations: Medical management. Compared to cardiac cath in 2008 there has been partial recanalization of the LAD occlusion with flow now into the second diagonal. The remainder of the LAD fills by right to left collaterals. There is no new disease to explain her arrest today.  Tijana Walder [place], [place] 11/07/2014, 5:17 PM

## 2014-11-19 NOTE — Procedures (Signed)
History: 54 yo F s/p cardiac arrest.   Sedation: versed, fentanyl  Technique: This is a 17 channel routine scalp EEG performed at the bedside with bipolar and monopolar montages arranged in accordance to the international 10/20 system of electrode placement. One channel was dedicated to EKG recording.    Background: The EEG is severely attenuated with the predominance being suppression. There are occasional bursts of centrally predominant alpha and delta lasting 1 - 2 seconds.   Photic stimulation: Physiologic driving is not performed  EEG Abnormalities: 1) Burst suppression EEG  Clinical Interpretation: This EEG is consistent with a profound cerebral dysfunction as can be seen in post-hypoxic injury, though medication(e.g. versed) could mimic the findings on this exam.   No seizure or seizure predispostion was recorded.     Roland Rack, MD [facility] [phone]  If 7pm- 7am, please page neurology on call as listed in [place].

## 2014-11-19 NOTE — Interval H&P Note (Signed)
History and Physical Interval Note:  11/20/2014 4:16 PM  [name]  has presented today for surgery, with the diagnosis of post cardiac arrest  The various methods of treatment have been discussed with the patient and family. After consideration of risks, benefits and other options for treatment, the patient has consented to  Procedure(s): LEFT HEART CATHETERIZATION WITH CORONARY ANGIOGRAM (N/A) as a surgical intervention .  [name] history has been reviewed, patient examined, no change in status, stable for surgery.  I have reviewed the patient's chart and labs.  Questions were answered to the patient's satisfaction.   Cath Lab Visit (complete for each Cath Lab visit)  Clinical Evaluation Leading to the Procedure:   ACS: Yes.    Non-ACS:    Anginal Classification: No Symptoms  Anti-ischemic medical therapy: No Therapy  Non-Invasive Test Results: No non-invasive testing performed  Prior CABG: No previous CABG        [name] [facility]ina [facility] 11/12/2014 4:16 PM

## 2014-11-19 NOTE — Progress Notes (Signed)
CRITICAL VALUE ALERT  Critical value received:  Troponin 0.81   Date of notification:  11/17/2014  Time of notification:  2153  Critical value read back:Yes.    Nurse who received alert:  Verlin Grills  MD notified (1st page):  Elink   Time of first page:  2155  Responding MD:  Warren Lacy  Time MD responded:  2155

## 2014-11-19 NOTE — ED Notes (Addendum)
Pt presents via [facility] from a bank where patient had witness cardiac arrest. Per EMS CPR was initiated, pt was in v-fib, was shocked x3, went into vtach shocked x2, went into vfib, was shocked again and went into sinus arrhythmia with pulses. Pt give 4 epi and 300mg  Amiodorone, 354mL Cold Saline given by EMS. Pt intubated 7.0ETT and being bagged. Dr. Jeneen Rinks, Claiborne Billings RN, Lilia Pro RN and [facility] RN at bedside. Pt arrested at approx 1049.

## 2014-11-19 NOTE — Procedures (Signed)
Arterial Catheter Insertion Procedure Note [name] [id number] Jul 02, 1961  Procedure: Insertion of Arterial Catheter  Indications: Blood pressure monitoring and Frequent blood sampling  Procedure Details Consent: unable to obtain consent due to pt on the vent and sedated Time Out: Verified patient identification, verified procedure, site/side was marked, verified correct patient position, special equipment/implants available, medications/allergies/relevent history reviewed, required imaging and test results available.  Performed  Maximum sterile technique was used including antiseptics, cap, gloves, gown, hand hygiene, mask and sheet. Skin prep: Chlorhexidine; local anesthetic administered 20 gauge catheter was inserted into right radial artery using the Seldinger technique.  Evaluation Blood flow good; BP tracing good. Complications: No apparent complications.   [name], [place] 11/30/2014

## 2014-11-19 NOTE — ED Notes (Signed)
Transporting patient to CT then to [place].
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
## 2014-11-19 NOTE — ED Notes (Signed)
Zoll pads placed on patient.  

## 2014-11-19 NOTE — ED Notes (Addendum)
20 Etomidate and 150 Succ given, Dr. Jeneen Rinks changing ETT.

## 2014-11-19 NOTE — Procedures (Signed)
Central Venous Catheter Insertion Procedure Note [name] [id number] April 04, 1961  Procedure: Insertion of Central Venous Catheter Indications: Assessment of intravascular volume, Drug and/or fluid administration and Frequent blood sampling  Procedure Details Consent: Unable to obtain consent because of emergent medical necessity. Time Out: Verified patient identification, verified procedure, site/side was marked, verified correct patient position, special equipment/implants available, medications/allergies/relevent history reviewed, required imaging and test results available.  Performed  Maximum sterile technique was used including antiseptics, cap, gloves, gown, hand hygiene, mask and sheet. Skin prep: Chlorhexidine; local anesthetic administered A antimicrobial bonded/coated triple lumen catheter was placed in the left internal jugular vein using the Seldinger technique.  Evaluation Blood flow good Complications: No apparent complications Patient did tolerate procedure well. Chest X-ray ordered to verify placement.  CXR: normal.  Procedure performed under direct ultrasound guidance for real time vessel cannulation.      [name], [facility] Pager: [phone]  or [phone] 11/26/2014, 12:32 PM  U/S used in placement.  I was present and supervised the entire procedure.  [name], M.D. [facility] Pulmonary/Critical Care Medicine. Pager: [phone]. After hours pager: [phone]

## 2014-11-19 NOTE — Progress Notes (Signed)
CRITICAL VALUE ALERT  Critical value received:  Potassium 3.0  Date of notification:  11/27/2014  Time of notification:  2153  Critical value read back:Yes.    Nurse who received alert:  Verlin Grills  MD notified (1st page):  Elink  Time of first page:  2155  Responding MD:  Warren Lacy  Time MD responded:  2155

## 2014-11-20 ENCOUNTER — Encounter (HOSPITAL_COMMUNITY): Payer: Self-pay | Admitting: Cardiology

## 2014-11-20 ENCOUNTER — Inpatient Hospital Stay (HOSPITAL_COMMUNITY): Payer: Commercial Managed Care - HMO

## 2014-11-20 DIAGNOSIS — Z7901 Long term (current) use of anticoagulants: Secondary | ICD-10-CM | POA: Insufficient documentation

## 2014-11-20 DIAGNOSIS — F1410 Cocaine abuse, uncomplicated: Secondary | ICD-10-CM

## 2014-11-20 DIAGNOSIS — I255 Ischemic cardiomyopathy: Secondary | ICD-10-CM | POA: Insufficient documentation

## 2014-11-20 DIAGNOSIS — I5023 Acute on chronic systolic (congestive) heart failure: Secondary | ICD-10-CM

## 2014-11-20 DIAGNOSIS — F1310 Sedative, hypnotic or anxiolytic abuse, uncomplicated: Secondary | ICD-10-CM

## 2014-11-20 DIAGNOSIS — T68XXXA Hypothermia, initial encounter: Secondary | ICD-10-CM

## 2014-11-20 DIAGNOSIS — N179 Acute kidney failure, unspecified: Secondary | ICD-10-CM

## 2014-11-20 DIAGNOSIS — I480 Paroxysmal atrial fibrillation: Secondary | ICD-10-CM

## 2014-11-20 DIAGNOSIS — Z5181 Encounter for therapeutic drug level monitoring: Secondary | ICD-10-CM

## 2014-11-20 DIAGNOSIS — Z21 Asymptomatic human immunodeficiency virus [HIV] infection status: Secondary | ICD-10-CM

## 2014-11-20 DIAGNOSIS — I509 Heart failure, unspecified: Secondary | ICD-10-CM

## 2014-11-20 DIAGNOSIS — Z9911 Dependence on respirator [ventilator] status: Secondary | ICD-10-CM

## 2014-11-20 LAB — POCT I-STAT, CHEM 8
BUN: 17 mg/dL (ref 6–23)
BUN: 19 mg/dL (ref 6–23)
BUN: 24 mg/dL — ABNORMAL HIGH (ref 6–23)
BUN: 28 mg/dL — AB (ref 6–23)
CALCIUM ION: 1.1 mmol/L — AB (ref 1.12–1.23)
CREATININE: 1.2 mg/dL — AB (ref 0.50–1.10)
Calcium, Ion: 1.11 mmol/L — ABNORMAL LOW (ref 1.12–1.23)
Calcium, Ion: 1.06 mmol/L — ABNORMAL LOW (ref 1.12–1.23)
Calcium, Ion: 1.1 mmol/L — ABNORMAL LOW (ref 1.12–1.23)
Chloride: 112 mmol/L (ref 96–112)
Chloride: 113 mmol/L — ABNORMAL HIGH (ref 96–112)
Chloride: 113 mmol/L — ABNORMAL HIGH (ref 96–112)
Chloride: 112 mmol/L (ref 96–112)
Creatinine, Ser: 1.1 mg/dL (ref 0.50–1.10)
Creatinine, Ser: 1.4 mg/dL — ABNORMAL HIGH (ref 0.50–1.10)
Creatinine, Ser: 1.3 mg/dL — ABNORMAL HIGH (ref 0.50–1.10)
GLUCOSE: 161 mg/dL — AB (ref 70–99)
GLUCOSE: 130 mg/dL — AB (ref 70–99)
Glucose, Bld: 201 mg/dL — ABNORMAL HIGH (ref 70–99)
Glucose, Bld: 146 mg/dL — ABNORMAL HIGH (ref 70–99)
HCT: 45 % (ref 36.0–46.0)
HCT: 45 % (ref 36.0–46.0)
HCT: 44 % (ref 36.0–46.0)
HCT: 46 % (ref 36.0–46.0)
HEMOGLOBIN: 15.3 g/dL — AB (ref 12.0–15.0)
Hemoglobin: 15.3 g/dL — ABNORMAL HIGH (ref 12.0–15.0)
Hemoglobin: 15 g/dL (ref 12.0–15.0)
Hemoglobin: 15.6 g/dL — ABNORMAL HIGH (ref 12.0–15.0)
POTASSIUM: 3.2 mmol/L — AB (ref 3.5–5.1)
POTASSIUM: 2.7 mmol/L — AB (ref 3.5–5.1)
Potassium: 2.8 mmol/L — ABNORMAL LOW (ref 3.5–5.1)
Potassium: 3.4 mmol/L — ABNORMAL LOW (ref 3.5–5.1)
SODIUM: 146 mmol/L — AB (ref 135–145)
SODIUM: 147 mmol/L — AB (ref 135–145)
SODIUM: 146 mmol/L — AB (ref 135–145)
Sodium: 148 mmol/L — ABNORMAL HIGH (ref 135–145)
TCO2: 15 mmol/L (ref 0–100)
TCO2: 15 mmol/L (ref 0–100)
TCO2: 16 mmol/L (ref 0–100)
TCO2: 15 mmol/L (ref 0–100)

## 2014-11-20 LAB — HEPATIC FUNCTION PANEL
ALBUMIN: 2.8 g/dL — AB (ref 3.5–5.2)
ALT: 133 U/L — ABNORMAL HIGH (ref 0–35)
AST: 274 U/L — AB (ref 0–37)
Alkaline Phosphatase: 76 U/L (ref 39–117)
BILIRUBIN DIRECT: 0.1 mg/dL (ref 0.0–0.5)
Indirect Bilirubin: 0.4 mg/dL (ref 0.3–0.9)
Total Bilirubin: 0.5 mg/dL (ref 0.3–1.2)
Total Protein: 7.1 g/dL (ref 6.0–8.3)

## 2014-11-20 LAB — BASIC METABOLIC PANEL
ANION GAP: 13 (ref 5–15)
Anion gap: 5 (ref 5–15)
Anion gap: 8 (ref 5–15)
Anion gap: 6 (ref 5–15)
BUN: 20 mg/dL (ref 6–23)
BUN: 15 mg/dL (ref 6–23)
BUN: 22 mg/dL (ref 6–23)
BUN: 29 mg/dL — ABNORMAL HIGH (ref 6–23)
CALCIUM: 7.4 mg/dL — AB (ref 8.4–10.5)
CHLORIDE: 114 mmol/L — AB (ref 96–112)
CHLORIDE: 116 mmol/L — AB (ref 96–112)
CO2: 20 mmol/L (ref 19–32)
CO2: 19 mmol/L (ref 19–32)
CO2: 18 mmol/L — AB (ref 19–32)
CO2: 16 mmol/L — ABNORMAL LOW (ref 19–32)
CREATININE: 1.23 mg/dL — AB (ref 0.50–1.10)
CREATININE: 1.36 mg/dL — AB (ref 0.50–1.10)
Calcium: 7.4 mg/dL — ABNORMAL LOW (ref 8.4–10.5)
Calcium: 7.3 mg/dL — ABNORMAL LOW (ref 8.4–10.5)
Calcium: 7.6 mg/dL — ABNORMAL LOW (ref 8.4–10.5)
Chloride: 116 mmol/L — ABNORMAL HIGH (ref 96–112)
Chloride: 113 mmol/L — ABNORMAL HIGH (ref 96–112)
Creatinine, Ser: 1.54 mg/dL — ABNORMAL HIGH (ref 0.50–1.10)
Creatinine, Ser: 2.04 mg/dL — ABNORMAL HIGH (ref 0.50–1.10)
GFR calc Af Amer: 50 mL/min — ABNORMAL LOW (ref >90)
GFR calc Af Amer: 57 mL/min — ABNORMAL LOW (ref >90)
GFR calc non Af Amer: 49 mL/min — ABNORMAL LOW (ref >90)
GFR calc non Af Amer: 37 mL/min — ABNORMAL LOW (ref >90)
GFR calc non Af Amer: 27 mL/min — ABNORMAL LOW (ref >90)
GFR, EST AFRICAN AMERICAN: 43 mL/min — AB (ref >90)
GFR, EST AFRICAN AMERICAN: 31 mL/min — AB (ref >90)
GFR, EST NON AFRICAN AMERICAN: 44 mL/min — AB (ref >90)
GLUCOSE: 127 mg/dL — AB (ref 70–99)
GLUCOSE: 143 mg/dL — AB (ref 70–99)
GLUCOSE: 195 mg/dL — AB (ref 70–99)
Glucose, Bld: 144 mg/dL — ABNORMAL HIGH (ref 70–99)
Potassium: 2.7 mmol/L — CL (ref 3.5–5.1)
Potassium: 3.1 mmol/L — ABNORMAL LOW (ref 3.5–5.1)
Potassium: 3 mmol/L — ABNORMAL LOW (ref 3.5–5.1)
Potassium: 3.2 mmol/L — ABNORMAL LOW (ref 3.5–5.1)
SODIUM: 141 mmol/L (ref 135–145)
SODIUM: 142 mmol/L (ref 135–145)
Sodium: 140 mmol/L (ref 135–145)
Sodium: 141 mmol/L (ref 135–145)

## 2014-11-20 LAB — CBC
HCT: 42 % (ref 36.0–46.0)
HEMOGLOBIN: 13.4 g/dL (ref 12.0–15.0)
MCH: 26.1 pg (ref 26.0–34.0)
MCHC: 31.9 g/dL (ref 30.0–36.0)
MCV: 81.9 fL (ref 78.0–100.0)
PLATELETS: 178 10*3/uL (ref 150–400)
RBC: 5.13 MIL/uL — ABNORMAL HIGH (ref 3.87–5.11)
RDW: 15.1 % (ref 11.5–15.5)
WBC: 11.1 10*3/uL — ABNORMAL HIGH (ref 4.0–10.5)

## 2014-11-20 LAB — GLUCOSE, CAPILLARY
GLUCOSE-CAPILLARY: 113 mg/dL — AB (ref 70–99)
GLUCOSE-CAPILLARY: 123 mg/dL — AB (ref 70–99)
GLUCOSE-CAPILLARY: 133 mg/dL — AB (ref 70–99)
GLUCOSE-CAPILLARY: 145 mg/dL — AB (ref 70–99)
GLUCOSE-CAPILLARY: 188 mg/dL — AB (ref 70–99)
Glucose-Capillary: 175 mg/dL — ABNORMAL HIGH (ref 70–99)
Glucose-Capillary: 167 mg/dL — ABNORMAL HIGH (ref 70–99)
Glucose-Capillary: 151 mg/dL — ABNORMAL HIGH (ref 70–99)
Glucose-Capillary: 141 mg/dL — ABNORMAL HIGH (ref 70–99)
Glucose-Capillary: 134 mg/dL — ABNORMAL HIGH (ref 70–99)
Glucose-Capillary: 147 mg/dL — ABNORMAL HIGH (ref 70–99)
Glucose-Capillary: 125 mg/dL — ABNORMAL HIGH (ref 70–99)
Glucose-Capillary: 110 mg/dL — ABNORMAL HIGH (ref 70–99)

## 2014-11-20 LAB — POCT I-STAT 3, ART BLOOD GAS (G3+)
Acid-base deficit: 11 mmol/L — ABNORMAL HIGH (ref 0.0–2.0)
Bicarbonate: 16.6 mEq/L — ABNORMAL LOW (ref 20.0–24.0)
O2 Saturation: 99 %
PH ART: 7.233 — AB (ref 7.350–7.450)
Patient temperature: 91.6
TCO2: 18 mmol/L (ref 0–100)
pCO2 arterial: 37.4 mmHg (ref 35.0–45.0)
pO2, Arterial: 131 mmHg — ABNORMAL HIGH (ref 80.0–100.0)

## 2014-11-20 LAB — MAGNESIUM
MAGNESIUM: 2.5 mg/dL (ref 1.5–2.5)
Magnesium: 1.7 mg/dL (ref 1.5–2.5)

## 2014-11-20 LAB — CORTISOL: Cortisol, Plasma: 11.8 ug/dL

## 2014-11-20 LAB — T-HELPER CELLS (CD4) COUNT (NOT AT ARMC)
CD4 T CELL ABS: 460 /uL (ref 400–2700)
CD4 T CELL HELPER: 22 % — AB (ref 33–55)

## 2014-11-20 LAB — TROPONIN I
Troponin I: 1.02 ng/mL (ref <0.031)
Troponin I: 1.04 ng/mL (ref <0.031)

## 2014-11-20 LAB — PATHOLOGIST SMEAR REVIEW

## 2014-11-20 LAB — PHOSPHORUS
PHOSPHORUS: 3.2 mg/dL (ref 2.3–4.6)
Phosphorus: 3.8 mg/dL (ref 2.3–4.6)

## 2014-11-20 LAB — HEPARIN LEVEL (UNFRACTIONATED): Heparin Unfractionated: 0.38 IU/mL (ref 0.30–0.70)

## 2014-11-20 MED ORDER — MAGNESIUM SULFATE 2 GM/50ML IV SOLN
2.0000 g | Freq: Once | INTRAVENOUS | Status: AC
  Administered 2014-11-20: 2 g via INTRAVENOUS
  Filled 2014-11-20: qty 50

## 2014-11-20 MED ORDER — PRO-STAT SUGAR FREE PO LIQD
30.0000 mL | Freq: Every day | ORAL | Status: DC
Start: 2014-11-20 — End: 2014-11-20
  Filled 2014-11-20: qty 30

## 2014-11-20 MED ORDER — HEPARIN (PORCINE) IN NACL 100-0.45 UNIT/ML-% IJ SOLN
1050.0000 [IU]/h | INTRAMUSCULAR | Status: DC
  Administered 2014-11-21: 1050 [IU]/h via INTRAVENOUS
  Filled 2014-11-20 (×3): qty 250

## 2014-11-20 MED ORDER — PERFLUTREN LIPID MICROSPHERE
INTRAVENOUS | Status: AC
  Administered 2014-11-20: 2 mL
  Filled 2014-11-20: qty 10

## 2014-11-20 MED ORDER — HYDROCORTISONE NA SUCCINATE PF 100 MG IJ SOLR
50.0000 mg | Freq: Four times a day (QID) | INTRAMUSCULAR | Status: DC
  Administered 2014-11-20 – 2014-11-22 (×8): 50 mg via INTRAVENOUS
  Filled 2014-11-20 (×12): qty 1

## 2014-11-20 MED ORDER — PERFLUTREN LIPID MICROSPHERE
1.0000 mL | INTRAVENOUS | Status: AC | PRN
  Filled 2014-11-20: qty 10

## 2014-11-20 MED ORDER — EMTRICITABINE-TENOFOVIR DF 200-300 MG PO TABS: 1.0000 | ORAL_TABLET | ORAL | Status: DC

## 2014-11-20 MED ORDER — POTASSIUM CHLORIDE 10 MEQ/50ML IV SOLN
INTRAVENOUS | Status: AC
  Filled 2014-11-20: qty 200

## 2014-11-20 MED ORDER — VITAL HIGH PROTEIN PO LIQD
1000.0000 mL | ORAL | Status: DC
  Filled 2014-11-20 (×2): qty 1000

## 2014-11-20 MED ORDER — POTASSIUM CHLORIDE 10 MEQ/50ML IV SOLN
10.0000 meq | INTRAVENOUS | Status: AC
  Administered 2014-11-20 (×4): 10 meq via INTRAVENOUS

## 2014-11-20 MED ORDER — HEPARIN (PORCINE) IN NACL 100-0.45 UNIT/ML-% IJ SOLN
800.0000 [IU]/h | INTRAMUSCULAR | Status: DC
  Administered 2014-11-20: 800 [IU]/h via INTRAVENOUS
  Filled 2014-11-20: qty 250

## 2014-11-20 MED ORDER — POTASSIUM CHLORIDE 20 MEQ/15ML (10%) PO SOLN
40.0000 meq | Freq: Once | ORAL | Status: AC
  Administered 2014-11-20: 40 meq
  Filled 2014-11-20: qty 30

## 2014-11-20 NOTE — Progress Notes (Signed)
Patient Name: [name] Date of Encounter: 11/20/2014  Active Problems:   Cardiac arrest   Acute respiratory failure   Anoxic encephalopathy   Cardiogenic shock   HIV (human immunodeficiency virus infection)   Length of Stay: 1  SUBJECTIVE  Sedated, paralyzed.   CURRENT MEDS . antiseptic oral rinse  7 mL Mouth Rinse QID  . artificial tears  1 application Both Eyes 3 times per day  . budesonide  0.5 mg Nebulization BID  . chlorhexidine  15 mL Mouth Rinse BID  . darunavir-cobicistat  1 tablet Oral Daily  . emtricitabine-tenofovir  1 tablet Oral Daily  . enoxaparin (LOVENOX) injection  40 mg Subcutaneous Q24H  . etravirine  200 mg Oral BID WC  . fentaNYL  100 mcg Intravenous Once  . insulin aspart  2-6 Units Subcutaneous 6 times per day  . ipratropium-albuterol  3 mL Nebulization Q6H  . midazolam  2 mg Intravenous Once  . pantoprazole (PROTONIX) IV  40 mg Intravenous QHS  . potassium chloride  10 mEq Intravenous Q1 Hr x 4  . potassium chloride      . sodium chloride  3 mL Intravenous Q12H    OBJECTIVE  Filed Vitals:   11/20/14 0800 11/20/14 0801 11/20/14 0900 11/20/14 1000  BP: 123/30 127/85 100/10   Pulse:  62    Temp: 90.9 F (32.7 C)  91.6 F (33.1 C)   TempSrc: Core (Comment)  Core (Comment)   Resp: 18 18 18 18   Height:      Weight:      SpO2:  100%      Intake/Output Summary (Last 24 hours) at 11/20/14 1002 Last data filed at 11/20/14 1000  Gross per 24 hour  Intake 4409.98 ml  Output   1692 ml  Net 2717.98 ml   Filed Weights   11/20/2014 1304 11/17/2014 1440 11/20/14 0400  Weight: 220 lb 14.4 oz (100.2 kg) 220 lb 14.4 oz (100.2 kg) 217 lb 9.9 oz (98.71 kg)    PHYSICAL EXAM  General: sedated, paralyzed HEENT:  ET tube  Neck: Supple without bruits , unable to assess JVD. Lungs:  Resp regular and unlabored, crackles B/L Heart: RRR no s3, s4, or murmurs. Abdomen: Soft, non-tender, non-distended, BS + x 4.  Extremities: No clubbing,  cyanosis or edema. DP/PT/Radials 2+ and equal bilaterally.  Accessory Clinical Findings  CBC  Recent Labs  11/15/2014 1150  11/30/2014 2000  11/20/14 0450 11/20/14 0920  WBC 13.4*  --  10.4  --  11.1*  --   NEUTROABS 5.4  --   --   --   --   --   HGB 10.5*  < > 13.0  < > 13.4 15.0  HCT 35.6*  < > 40.4  < > 42.0 44.0  MCV 85.6  --  81.9  --  81.9  --   PLT 170  --  197  --  178  --   < > = values in this interval not displayed. Basic Metabolic Panel  Recent Labs  11/20/14 0005 11/20/14 0100 11/20/14 0450 11/20/14 0920  NA 141  --  141 148*  K 2.7*  --  3.0* 2.7*  CL 116*  --  116* 113*  CO2 20  --  19  --   GLUCOSE 143*  --  144* 146*  BUN 20  --  22 24*  CREATININE 1.36*  --  1.54* 1.40*  CALCIUM 7.4*  --  7.3*  --  MG  --  1.7 2.5  --   PHOS  --  3.2 3.8  --    Liver Function Tests  Recent Labs  12/05/2014 1150 11/20/14 0450  AST 106* 274*  ALT 52* 133*  ALKPHOS 70 76  BILITOT 0.6 0.5  PROT 6.6 7.1  ALBUMIN 2.7* 2.8*   No results for input(s): LIPASE, AMYLASE in the last 72 hours. Cardiac Enzymes  Recent Labs  11/18/2014 2000 11/20/14 0005 11/20/14 0737  TROPONINI 0.81* 1.02* 1.04*    Recent Labs  11/09/2014 1253  TSH 6.384*    Radiology/Studies  Ct Head Wo Contrast  11/26/2014   CLINICAL DATA:  Cardiac arrest.  Intracranial hemorrhage.   IMPRESSION: Chronic ischemic changes as above. No acute abnormality. No intracranial hemorrhage.      Dg Chest Portable 1 View  11/10/2014   CLINICAL DATA:  Check central line placement    IMPRESSION: Tubes and lines as described.  Increasing vascular congestion and interstitial edema.     Dg Chest Portable 1 View  11/10/2014   CLINICAL DATA:  Post cardiac arrest. CP are performed. Seizure prior to arrest.  IMPRESSION: Mild cardiomegaly.  Pulmonary vascular congestion.  Endotracheal Tube and nasogastric tube placed.    TELE: SR, frequent PVCs, bigeminy  ECG: SR, neg T waves in the anterior leads, new Q wave  in the lateral leads, long QT/QTc 606/579 ms  Left cardiac cath: 11/18/2014 Left mainstem: Normal  Left anterior descending (LAD): The LAD has a stent after the first diagonal. It is occluded. There is recanalization into the second diagonal. The LAD fills by right to left collaterals. The first diagonal is normal.   Left circumflex (LCx): Normal  Right coronary artery (RCA): 30% proximal stenosis.   Left ventriculography: not done due to high EDP  Final Conclusions:  1. Single vessel occlusive CAD. 2. High LV EDP  Recommendations: Medical management. Compared to cardiac cath in 2008 there has been partial recanalization of the LAD occlusion with flow now into the second diagonal. The remainder of the LAD fills by right to left collaterals. There is no new disease to explain her arrest today.    ASSESSMENT AND PLAN  The patient is a 54yo female with a history of CAD with stent In LAD at [place] [facility] long time ago? More recent stent at [facility] in 2008 (in-stent restenosis). In hospital 4/13 with TIA. TEE by Dr Stanford Breed with small PFO no SOE. Her last echo was 05/2014 with EF 30-35%, Akinesis of the anteroseptal, anterior, and apical myocardium, moderate diffuse hypokinesis, LA moderately dilated. Her history also includes Cocaine use(+ in sept 2015),HLD, HTN, OSA(apparently unreceptive to CPAP), COPD, CHF, HIV(followed by Dr. Tommy Medal), Anxiety, depression, loop recorder(09/2013). She's been followed by Dr. Doran Durand for osteomyelitis. She was brought in by EMS after having a VF arrest while at the bank. Shock x6. 15-20 minutes of CPR before ROSC. Initial troponin 0.11. She was started on hypothermia protocol.   1. CAD, cath showed occluded LAD with right to left collaterals, high LVEDP, Troponin 0.8 --> 1.04, unable to use ASA, statins (elevated LFTs) and betablockers (hypotension on levophed).  2. Prolonged QT/QTc, this is new change, most probably related to hypokalemia that is being  treated, I have reviewed the list of her meds with the pharmacist and there is no red flag  3. Parox a-fibrillation - now in SR, on Eliquis in the past, seems that she wasn't complaint (our pharmacist found out that she never refilled her prescription).  She had an episode yesterday.  Her CHADS-VASc is 5, including prior h/o stroke, I would recommend to start iv Heparin.   4. Ischemic cardiomyopathy - LVEF on last year echo 30-35%, new one is pending, unable to start ACEI or BB right now.   5. Acute on chronic systolic CHF - she is fluid overloaded, she is still hypothermic and on 15 mcg of levophed, positive balance 2.7 L in the last 24 hours, ARI, I would wait until she is being rewarmed and follow troponin, can start low iv lasix tomorrow.   6. Hypokalemia - being replaced  7. Prognosis - poor based on EEG, start rewarming at 5 pm  Signed, Dorothy Spark MD, [facility] 11/20/2014

## 2014-11-20 NOTE — Progress Notes (Signed)
Called Elink MD Ramaswamy because pt having intermittent ventricular bigeminy with pulse in 30's. MD ordered to d/c amio and to run a stat mag and phos lab. MD asked RN to call with results of labs. Will continue to monitor patient.

## 2014-11-20 NOTE — Progress Notes (Signed)
Reginal Lutes MD Ramaswamy to notify of mag (1.7), phos (3.2), and potassium (3.1) lab results. Also informed MD that pt urine output has decreased to 15-30 q2 hours. Also informed MD that pt is still having intermittent bigeminy with a pulse in the 30's. MD ordered 2g magnesium sulfate and 75mEq potassium chloride. Will continue to monitor patient.

## 2014-11-20 NOTE — Progress Notes (Addendum)
ANTICOAGULATION CONSULT NOTE - Follow Up Consult  Pharmacy Consult for heparin Indication: atrial fibrillation  Allergies  Allergen Reactions  . Atorvastatin Other (See Comments)    Other reaction(s): Other (See Comments) Problems with memory  Patient states that the medication affects her memory    Patient Measurements: Height: 5\' 4"  (162.6 cm) Weight: 217 lb 9.9 oz (98.71 kg) IBW/kg (Calculated) : 54.7 Heparin Dosing Weight: 78kg  Vital Signs: Temp: 91.6 F (33.1 C) (03/16 1100) Temp Source: Core (Comment) (03/16 1100) BP: 114/33 mmHg (03/16 1100) Pulse Rate: 62 (03/16 0801)  Labs:  Recent Labs  12/05/2014 1150  12/04/2014 1253 11/11/2014 1435  11/17/2014 2000  11/09/2014 2021 11/05/2014 2214 11/20/14 0005 11/20/14 0450 11/20/14 0737 11/20/14 0920  HGB 10.5*  --   --   --   < > 13.0  < >  --  15.3*  --  13.4  --  15.0  HCT 35.6*  --   --   --   < > 40.4  < >  --  45.0  --  42.0  --  44.0  PLT 170  --   --   --   --  197  --   --   --   --  178  --   --   APTT  --   --  30  --   --   --   --  29  --   --   --   --   --   LABPROT  --   --  16.1*  --   --   --   --  14.3  --   --   --   --   --   INR  --   --  1.27  --   --   --   --  1.10  --   --   --   --   --   HEPARINUNFRC  --   --   --  <0.10*  --   --   --   --   --   --   --   --   --   CREATININE 1.45*  --  1.28* 1.21*  < > 1.26*  < > 1.23* 1.20* 1.36* 1.54*  --  1.40*  TROPONINI  --   < > 0.11*  --   --  0.81*  --   --   --  1.02*  --  1.04*  --   < > = values in this interval not displayed.  Estimated Creatinine Clearance: 53 mL/min (by C-G formula based on Cr of 1.4).   Medications:  Scheduled:  . antiseptic oral rinse  7 mL Mouth Rinse QID  . artificial tears  1 application Both Eyes 3 times per day  . budesonide  0.5 mg Nebulization BID  . chlorhexidine  15 mL Mouth Rinse BID  . darunavir-cobicistat  1 tablet Oral Daily  . emtricitabine-tenofovir  1 tablet Oral Daily  . enoxaparin (LOVENOX) injection  40  mg Subcutaneous Q24H  . etravirine  200 mg Oral BID WC  . fentaNYL  100 mcg Intravenous Once  . hydrocortisone sodium succinate  50 mg Intravenous Q6H  . insulin aspart  2-6 Units Subcutaneous 6 times per day  . ipratropium-albuterol  3 mL Nebulization Q6H  . midazolam  2 mg Intravenous Once  . pantoprazole (PROTONIX) IV  40 mg Intravenous QHS  . potassium chloride  10 mEq Intravenous Q1  Hr x 4  . sodium chloride  3 mL Intravenous Q12H    Assessment: 54 yo female s/p cardiac arrest on hypothermia protocol and noted with PAF. She is reported on apixiban PTA but has it appears she has not been compliant.   She was previously on heparin prior to cath and pharmacy has been consulted to restart heparin (cath was 11/18/2014).  Rewarming to start at about 5pm.   -Lovenox 40mg  sq given at about 8am.  Goal of Therapy:  Heparin level 0.3-0.7 units/ml Monitor platelets by anticoagulation protocol: Yes   Plan:  -No heparin bolus -Start heparin at 800 units/hr (~ 10 units/kg/hr) -Heparin level in 6 hours and daily with CBC daily  Hildred Laser, Pharm D 11/20/2014 11:42 AM

## 2014-11-20 NOTE — Progress Notes (Signed)
ANTICOAGULATION CONSULT NOTE - FOLLOW UP    HL = 0.38 (goal 0.3 - 0.7 units/mL) Heparin dosing weight = 78 kg   Assessment: 31 YOF with PAF on Eliquis PTA on continue on IV heparin.  Patient is s/p cardiac arrest and is currently being rewarmed.  She is also s/p cath on 12/03/2014.  Heparin level is therapeutic but expect it to trend down given rewarming.  No bleeding reported.   Plan: - Increase heparin gtt slightly to 850 units/hr - Recheck HL in 6 hrs    Manjot Beumer D. Mina Marble, PharmD, BCPS Pager:  [phone] 11/20/2014, 7:20 PM
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
## 2014-11-20 NOTE — Progress Notes (Signed)
Echocardiogram 2D Echocardiogram with Definity has been performed.  [name] 11/20/2014, 12:04 PM

## 2014-11-20 NOTE — Progress Notes (Signed)
RN called  Patient having HR 30s with bigeminii - happens intermittently    Recent Labs Lab 12/05/2014 1150 12/01/2014 1253 11/13/2014 1435 11/11/2014 1614 11/16/2014 1835 11/10/2014 2000  NA 141 144 143 149* 145 140  K 3.5 3.4* 3.3* 2.8* 3.4* 3.0*  CL 108 116* 117* 112 112 114*  CO2 16* 15* 19  --   --  21  GLUCOSE 316* 228* 139* 184* 218* 199*  BUN 8 9 12 12 15 15   CREATININE 1.45* 1.28* 1.21* 1.00 1.10 1.26*  CALCIUM 8.2* 7.1* 7.5*  --   --  7.3*   PLAN  dc amio gtt Check mag Check phos  Dr. Brand Males, M.D., F.C.C.P Pulmonary and Critical Care Medicine Staff Physician [place] [facility] Pager: [phone], If no answer or between  15:00h - 7:00h: call [phone]  11/20/2014 1:02 AM

## 2014-11-20 NOTE — Progress Notes (Signed)
eLink Physician-Brief Progress Note Patient Name: [name] DOB: 1961/02/14 MRN: [id number]   Date of Service  11/20/2014  HPI/Events of Note  Order clarification: IVF Cardiology notes and cath notes volume overload and plans for lasix tomorrow Levophed at 94mcg/min  eICU Interventions  kvo fluids     Intervention Category Minor Interventions: Routine modifications to care plan (e.g. PRN medications for pain, fever)  MCQUAID, DOUGLAS 11/20/2014, 3:30 PM

## 2014-11-20 NOTE — Progress Notes (Signed)
PULMONARY / CRITICAL CARE MEDICINE   Name: [name] MRN: [id number] DOB: 30-Apr-1961    ADMISSION DATE:  12/04/2014 CONSULTATION DATE:  11/20/2014  REFERRING MD :  EDP  CHIEF COMPLAINT:  Cardiac Arrest  INITIAL PRESENTATION:  54 y.o. F brought to [facility] ED 3/15 after suffering VF cardiac arrest while at her bank.  She was shocked a total of 6 times with roughly 15 - 20 minutes of resuscitation prior to ROSC.  In ED she remained unresponsive and PCCM consulted for consideration of hypothermia.     STUDIES:  CXR 3/15 >>> mild cardiomegaly, vascular congestion. CT Head 3/15 >>>  SIGNIFICANT EVENTS: 3/15 - admitted after cardiac arrest   HISTORY OF PRESENT ILLNESS:  Pt is encephalopathic; therefore, this HPI is obtained from chart review. [name] is a 54 y.o. F with PMH as outlined below which includes known HIV.  She was in her USOH until 3/15 when she collapsed and had a witnessed cardiac arrest while at her bank that morning.  EMS was dispatched and upon their arrival, pt noted to be in VF.  She was shocked 3 times with rhythm then converting to VT.  Shocked twice more with return to VF.  Shocked a sixth time before return to NSR and ROSC.  Total ACLS time roughly 15 - 20 minutes. On arrival to ED, she remained unresponsive and was intubated for respiratory insufficiency.  PCCM contacted for consideration of hypothermia.  HIV followed by Dr. Tommy Medal.  Per his last note 08/07/14, pt was previously controlled on STRIBILD but was only taking it 1/2 of the time she was supposed to due to severe foot pain (had extensive workup with Dr. Doran Durand in ortho with initial MRI concerning for osteo; however, biopsies returning as negative / benign bone).  Med regimen deemed inappropriate by Dr. Tommy Medal so pt switched to Prezcobix and Truvada with Intelence 400mg  daily.  SUBJECTIVE: No events overnight, undergoing the hypothermia protocol.  VITAL SIGNS: Temp:  [89.6 F (32 C)-96.6 F (35.9  C)] 91.8 F (33.2 C) (03/16 1000) Pulse Rate:  [33-102] 62 (03/16 0801) Resp:  [14-25] 18 (03/16 1000) BP: (70-137)/(10-100) 100/10 mmHg (03/16 0900) SpO2:  [48 %-100 %] 100 % (03/16 0801) Arterial Line BP: (89-163)/(56-115) 103/74 mmHg (03/16 1000) FiO2 (%):  [40 %-50 %] 40 % (03/16 0801) Weight:  [98.71 kg (217 lb 9.9 oz)-100.2 kg (220 lb 14.4 oz)] 98.71 kg (217 lb 9.9 oz) (03/16 0400)   HEMODYNAMICS: CVP:  [7 mmHg-9 mmHg] 7 mmHg   VENTILATOR SETTINGS: Vent Mode:  [-] PRVC FiO2 (%):  [40 %-50 %] 40 % Set Rate:  [18 bmp] 18 bmp Vt Set:  [440 mL] 440 mL PEEP:  [5 cmH20] 5 cmH20 Plateau Pressure:  [18 cmH20-19 cmH20] 18 cmH20   INTAKE / OUTPUT: Intake/Output      03/15 0701 - 03/16 0700 03/16 0701 - 03/17 0700   P.O.  0   I.V. (mL/kg) 3775.6 (38.2) 154.4 (1.6)   NG/GT 30 300   IV Piggyback 100 50   Total Intake(mL/kg) 3905.6 (39.6) 504.4 (5.1)   Urine (mL/kg/hr) 1595 45 (0.1)   Emesis/NG output 50    Stool 2    Total Output 1647 45   Net +2258.6 +459.4         PHYSICAL EXAMINATION: General: Obese female, unresponsive, sedated and paralyzed. Neuro: Unresponsive, sedated and paralyzed, unable to fully examine. HEENT: [place]/AT. PERRL, sclerae anicteric. Cardiovascular: RRR, no M/R/G.  Lungs: Respirations even  and unlabored.  Coarse bilaterally L > R. Abdomen: BS x 4, soft, NT/ND.  Musculoskeletal: No gross deformities, no edema.  Skin: Intact, warm, no rashes.  LABS:  CBC  Recent Labs Lab 11/06/2014 1150  12/03/2014 2000  11/11/2014 2214 11/20/14 0450 11/20/14 0920  WBC 13.4*  --  10.4  --   --  11.1*  --   HGB 10.5*  < > 13.0  < > 15.3* 13.4 15.0  HCT 35.6*  < > 40.4  < > 45.0 42.0 44.0  PLT 170  --  197  --   --  178  --   < > = values in this interval not displayed. Coag's  Recent Labs Lab 11/11/2014 1253 11/11/2014 2021  APTT 30 29  INR 1.27 1.10   BMET  Recent Labs Lab 12/02/2014 2021  11/20/14 0005 11/20/14 0450 11/20/14 0920  NA 140  < > 141 141  148*  K 3.1*  < > 2.7* 3.0* 2.7*  CL 114*  < > 116* 116* 113*  CO2 18*  --  20 19  --   BUN 15  < > 20 22 24*  CREATININE 1.23*  < > 1.36* 1.54* 1.40*  GLUCOSE 195*  < > 143* 144* 146*  < > = values in this interval not displayed. Electrolytes  Recent Labs Lab 11/10/2014 2021 11/20/14 0005 11/20/14 0100 11/20/14 0450  CALCIUM 7.4* 7.4*  --  7.3*  MG  --   --  1.7 2.5  PHOS  --   --  3.2 3.8   Sepsis Markers  Recent Labs Lab 11/21/2014 1211  LATICACIDVEN 13.58*   ABG  Recent Labs Lab 12/03/2014 1302  PHART 7.173*  PCO2ART 50.0*  PO2ART 397.0*   Liver Enzymes  Recent Labs Lab 11/25/2014 1150 11/20/14 0450  AST 106* 274*  ALT 52* 133*  ALKPHOS 70 76  BILITOT 0.6 0.5  ALBUMIN 2.7* 2.8*   Cardiac Enzymes  Recent Labs Lab 11/14/2014 2000 11/20/14 0005 11/20/14 0737  TROPONINI 0.81* 1.02* 1.04*   Glucose  Recent Labs Lab 11/20/14 0001 11/20/14 0114 11/20/14 0345 11/20/14 0555 11/20/14 0740 11/20/14 0942  GLUCAP 133* 141* 145* 134* 123* 147*    Imaging Ct Head Wo Contrast  11/21/2014   CLINICAL DATA:  Cardiac arrest.  Intracranial hemorrhage.  EXAM: CT HEAD WITHOUT CONTRAST  TECHNIQUE: Contiguous axial images were obtained from the base of the skull through the vertex without intravenous contrast.  COMPARISON:  MRI 05/30/2014, CT 05/30/2014  FINDINGS: Chronic left frontal infarct. Chronic infarct right cerebellum. Hypodensity centrum semiovale on the right is unchanged and consistent with chronic ischemia.  Negative for acute infarct. Negative for hemorrhage or mass. Ventricle size is normal.  Calvarium intact.  Scleral banding right eye  IMPRESSION: Chronic ischemic changes as above. No acute abnormality. No intracranial hemorrhage.   Electronically Signed   By: Franchot Gallo M.D.   On: 12/03/2014 14:28   Dg Chest Portable 1 View  11/29/2014   CLINICAL DATA:  Check central line placement  EXAM: PORTABLE CHEST - 1 VIEW  COMPARISON:  11/12/2014  FINDINGS:  Endotracheal tube, nasogastric catheter and new left jugular central line are within normal limits. The central line tip is noted at the cavoatrial junction. No pneumothorax is seen. Monitoring device is again noted. The inspiratory effort is stable. Increasing congestion and mild interstitial edema is noted.  IMPRESSION: Tubes and lines as described.  Increasing vascular congestion and interstitial edema.   Electronically Signed  By: Inez Catalina M.D.   On: 12/02/2014 12:53   Dg Chest Portable 1 View  11/16/2014   CLINICAL DATA:  Post cardiac arrest. CP are performed. Seizure prior to arrest.  EXAM: PORTABLE CHEST - 1 VIEW  COMPARISON:  05/30/2014  FINDINGS: Endotracheal tube is in place with tip approximately 2.4 cm above carina. Nasogastric tube is in place with tip beyond the imaged but beyond the gastroesophageal junction. The  Shallow lung inflation. Heart is enlarged. There is mild pulmonary vascular congestion without overt edema. No pneumothorax. No acute displaced fractures identified.  IMPRESSION: Mild cardiomegaly.  Pulmonary vascular congestion.  Endotracheal Tube and nasogastric tube placed.   Electronically Signed   By: Nolon Nations M.D.   On: 11/30/2014 12:27    ASSESSMENT / PLAN:  PULMONARY OETT 3/15 >>> A: Acute respiratory failure following cardiac arrest COPD without evidence of exacerbation P:   Full mechanical support, 8cc/kg. ABG now. VAP bundle. Hold SBT while undergoing hypothermia protocol. DuoNebs / Albuterol / Budesonide in lieu of outpatient beclomethasone. ABG and CXR in AM.  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 Goal CVP 8 - 12. Trend troponin / lactate. Cortisol 11.8, will start stress dose steroids. TTE. UDS  positive for benzos and cocaine. Heparin gtt in lieu of outpatient eliquis. Hold outpatient apixaban, carvedilol, HCTZ, imdur, lisinopril, rosuvastatin.  RENAL A:   AGMA - lactate AKI At risk for metabolic derangements due to cold diuresis during hypothermia  Pseudohypocalcemia P:   NS @ 125. BMP q2hrs during hypothermia protocol. Trend lactate. Replace electrolytes as indicated.  GASTROINTESTINAL A:   Mild transaminitis GI prophylaxis Nutrition P:   LFT's in AM. SUP: Pantoprazole. NPO until off paralytics then nutrition consult for TF's.  HEMATOLOGIC A:   Anemia VTE Prophylaxis P:  Transfuse per usual ICU guidelines. SCD's / Heparin gtt. Coags q8hrs during hypothermia protocol. CBC in AM.  INFECTIOUS A:   Leukocytosis - no indication of infection at this point, likely acute phase reactant HIV - followed by Dr. Tommy Medal P:   Monitor clinically. ID consulted appreciated.  ENDOCRINE A:   Anticipate hyperglycemia during hypothermia protocol Hypothyroidism - not on outpatient meds P:   ICU hyperglycemia protocol. TSH.  NEUROLOGIC A:   Acute anoxic encephalopathy Hx CVA, Depression, chronic lower back pain Positive UDS in past P:   Sedation:  Nimbex / Fentanyl gtt / Midazolam gtt. RASS goal: -5 during hypothermia protocol. Hold daily WUA until rewarming phase. EEG noted with burst suppression and diffuse slowness. Hold outpatient ambien, trazodone, percocet, morphine, gabapentin.  Family updated: None.  Interdisciplinary Family Meeting v Palliative Care Meeting:  Due by: 3/21.  The patient is critically ill with multiple organ systems failure and requires high complexity decision making for assessment and support, frequent evaluation and titration of therapies, application of advanced monitoring technologies and extensive interpretation of multiple databases.   Critical Care Time devoted to patient care services described in this note is  35  Minutes.  This time reflects time of care of this signee Dr Jennet Maduro. This critical care time does not reflect procedure time, or teaching time or supervisory time of PA/NP/Med student/Med Resident etc but could involve care discussion time.  Rush Farmer, M.D. [facility] Pulmonary/Critical Care  Medicine. Pager: [phone]. After hours pager: [phone].  11/20/2014, 10:52 AM

## 2014-11-20 NOTE — Progress Notes (Signed)
  Still with bigeminii despite dc amio Labs back  Recent Labs Lab 12/01/2014 1150 11/06/2014 1614 11/15/2014 1835 11/09/2014 2000  HGB 10.5* 13.9 14.3 13.0  HCT 35.6* 41.0 42.0 40.4  WBC 13.4*  --   --  10.4  PLT 170  --   --  197    Recent Labs Lab 11/25/2014 1150 11/13/2014 1253 11/07/2014 1435 11/18/2014 1614 12/02/2014 1835 11/12/2014 2000 11/15/2014 2021 11/20/14 0100  NA 141 144 143 149* 145 140 140  --   K 3.5 3.4* 3.3* 2.8* 3.4* 3.0* 3.1*  --   CL 108 116* 117* 112 112 114* 114*  --   CO2 16* 15* 19  --   --  21 18*  --   GLUCOSE 316* 228* 139* 184* 218* 199* 195*  --   BUN 8 9 12 12 15 15 15   --   CREATININE 1.45* 1.28* 1.21* 1.00 1.10 1.26* 1.23*  --   CALCIUM 8.2* 7.1* 7.5*  --   --  7.3* 7.4*  --   MG  --   --   --   --   --   --   --  1.7  PHOS  --   --   --   --   --   --   --  3.2   Estimated Creatinine Clearance: 60.9 mL/min (by C-G formula based on Cr of 1.23).   Intake/Output Summary (Last 24 hours) at 11/20/14 0240 Last data filed at 11/20/14 0200  Gross per 24 hour  Intake 3490.59 ml  Output   1477 ml  Net 2013.59 ml    A Bigemnii Low K Low Mag  Plan 2gm mag sulfate 71meq kc x 1  Dr. Brand Males, M.D., [facility].C.P Pulmonary and Critical Care Medicine Staff Physician [place] Pulmonary and Critical Care Pager: [phone], If no answer or between  15:00h - 7:00h: call [phone]  11/20/2014 2:40 AM

## 2014-11-20 NOTE — Progress Notes (Signed)
Patient ID: [name], female   DOB: 1960/09/08, 54 y.o.   MRN: [id number]         [facility] for Infectious Disease    Date of Admission:  11/30/2014     Active Problems:   Cardiac arrest   Acute respiratory failure   Anoxic encephalopathy   Cardiogenic shock   HIV Infection   . antiseptic oral rinse  7 mL Mouth Rinse QID  . artificial tears  1 application Both Eyes 3 times per day  . budesonide  0.5 mg Nebulization BID  . chlorhexidine  15 mL Mouth Rinse BID  . darunavir-cobicistat  1 tablet Oral Daily  . emtricitabine-tenofovir  1 tablet Oral Daily  . enoxaparin (LOVENOX) injection  40 mg Subcutaneous Q24H  . etravirine  200 mg Oral BID WC  . fentaNYL  100 mcg Intravenous Once  . insulin aspart  2-6 Units Subcutaneous 6 times per day  . ipratropium-albuterol  3 mL Nebulization Q6H  . midazolam  2 mg Intravenous Once  . pantoprazole (PROTONIX) IV  40 mg Intravenous QHS  . potassium chloride  10 mEq Intravenous Q1 Hr x 4  . potassium chloride      . sodium chloride  3 mL Intravenous Q12H    Past Medical History  Diagnosis Date  . Cholecystitis     Gall bladder removed   . Hypercholesterolemia   . Fibroids   . HIV (human immunodeficiency virus infection) 05/27/11    CD4 460, VL undetectable 10/12/12  . Pelvic pain in female 10/14/11  . PMB (postmenopausal bleeding)     Since 2010  . Increased BMI 05/27/11  . Anxiety   . Depression   . Hypertension   . H/O varicella   . History of measles, mumps, or rubella   . Blood transfusion 1995    "related to losing blood in urine during pregnancy"  . Hypothyroidism     Reports h/o hypothyroidism; not on any meds, TSH wnl over several years  . 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 Myocardial infarction 07/2004  . COPD (chronic obstructive pulmonary disease)     well controlled, only using albuterol inhaler occasionally   . Iron deficiency  anemia   . Lower GI bleed 04/24/2012    from hemorrhoids. No recent colonoscopy   . CAD in native artery     s/p stent and restenting of LAD. On plavix  . Anginal pain   . Pneumonia 1980's    "once"  . Chronic bronchitis     "get it q yr" (09/14/2013)  . Stroke 12/2011    R insular ischemic CVA ; residual "speech messes up when I get sick or real tired" (09/14/2013)  . Arthritis     "knees; left shoulder; right anklef/foot" (09/14/2013)  . Bursitis, shoulder     "right"  . Chronic lower back pain   . Carpal tunnel syndrome, bilateral     wears slint bilateral  . History of loop recorder     History  Substance Use Topics  . Smoking status: Former Smoker -- 0.50 packs/day for 29 years    Types: Cigarettes    Quit date: 05/07/2006  . Smokeless tobacco: Never Used  . Alcohol Use: 0.0 oz/week     Comment: Occasional drinker. Wine    Family History  Problem Relation Age of Onset  . Hypertension Mother   . Hyperlipidemia Mother   .  Obesity Mother   . Heart disease Mother   . Hypertension Maternal Aunt   . Hyperlipidemia Maternal Aunt   . Obesity Maternal Aunt   . Stroke Maternal Aunt    Allergies  Allergen Reactions  . Atorvastatin Other (See Comments)    Other reaction(s): Other (See Comments) Problems with memory  Patient states that the medication affects her memory    OBJECTIVE: Blood pressure 100/10, pulse 62, temperature 91.6 F (33.1 C), temperature source Core (Comment), resp. rate 18, height 5\' 4"  (1.626 m), weight 217 lb 9.9 oz (98.71 kg), last menstrual period 12/27/2012, SpO2 100 %. General: She remains hypothermic and unresponsive on the ventilator Skin: Cool and dry Lungs: Clear Cor: Regular S1 and S2 with no murmurs Abdomen: Soft Extremities: No acute inflammation noted  Lab Results Lab Results  Component Value Date   WBC 11.1* 11/20/2014   HGB 15.0 11/20/2014   HCT 44.0 11/20/2014   MCV 81.9 11/20/2014   PLT 178 11/20/2014    Lab Results    Component Value Date   CREATININE 1.40* 11/20/2014   BUN 24* 11/20/2014   NA 148* 11/20/2014   K 2.7* 11/20/2014   CL 113* 11/20/2014   CO2 19 11/20/2014    Lab Results  Component Value Date   ALT 133* 11/20/2014   AST 274* 11/20/2014   ALKPHOS 76 11/20/2014   BILITOT 0.5 11/20/2014    Urine drug screen 11/22/2014: Positive for cocaine and benzodiazepines  HIV 1 RNA QUANT (copies/mL)  Date Value  08/07/2014 207*  01/16/2014 <20  10/25/2013 1332*   CD4 T CELL ABS (/uL)  Date Value  08/07/2014 380*  01/16/2014 420  10/25/2013 440   [employer] genotype database shows:  Drug Resistance Interpretation: PR PI Major Resistance Mutations:None PI Minor Resistance Mutations:None Other Mutations:L63P Protease Inhibitors atazanavir/r (ATV/r)Susceptible darunavir/r (DRV/r)Susceptible fosamprenavir/r (FPV/r)Susceptible indinavir/r (IDV/r)Susceptible lopinavir/r (LPV/r)Susceptible nelfinavir (NFV)Susceptible saquinavir/r (SQV/r)Susceptible tipranavir/r (TPV/r)Susceptible PR Comments  Drug Resistance Interpretation: RT NRTI Resistance Mutations:D67N, K70R, M184V, T215F, K219E NNRTI Resistance Mutations:K103N Other Mutations:None Nucleoside RTI lamivudine (3TC)High-level resistance abacavir (ABC)High-level resistance zidovudine (AZT)High-level resistance stavudine (D4T)High-level resistance didanosine (DDI)Intermediate resistance emtricitabine (FTC)High-level resistance tenofovir (TDF)Intermediate resistance  Assessment: [name] suffered an out of hospital V. fib arrest yesterday requiring 15-20 minutes of CPR before restoration of spontaneous circulation. She now has some acute renal insufficiency. Her HIV therapy is complicated by a significant multidrug resistant virus with multiple mutations. Her  current regimen contains tenofovir and cobicistat, both of which can cause renal insufficiency. Repeat CD4 and viral load are pending. I will monitor renal function and consider alternative salvage regimens if her creatinine continues to rise.  Plan: 1. Continue current antiretroviral regimen for now 2. Monitor renal function, cardiac status and mental status  Michel Bickers, MD [facility] for Infectious [place] [phone] pager   [phone] cell 11/20/2014, 9:59 AM

## 2014-11-21 ENCOUNTER — Inpatient Hospital Stay (HOSPITAL_COMMUNITY): Payer: Commercial Managed Care - HMO

## 2014-11-21 LAB — POCT I-STAT 3, ART BLOOD GAS (G3+)
Acid-base deficit: 8 mmol/L — ABNORMAL HIGH (ref 0.0–2.0)
Acid-base deficit: 10 mmol/L — ABNORMAL HIGH (ref 0.0–2.0)
Bicarbonate: 18.9 mEq/L — ABNORMAL LOW (ref 20.0–24.0)
Bicarbonate: 14.1 mEq/L — ABNORMAL LOW (ref 20.0–24.0)
O2 Saturation: 98 %
O2 Saturation: 98 %
PCO2 ART: 45.2 mmHg — AB (ref 35.0–45.0)
PCO2 ART: 26.4 mmHg — AB (ref 35.0–45.0)
PH ART: 7.231 — AB (ref 7.350–7.450)
PO2 ART: 110 mmHg — AB (ref 80.0–100.0)
Patient temperature: 99.1
TCO2: 20 mmol/L (ref 0–100)
TCO2: 15 mmol/L (ref 0–100)
pH, Arterial: 7.337 — ABNORMAL LOW (ref 7.350–7.450)
pO2, Arterial: 124 mmHg — ABNORMAL HIGH (ref 80.0–100.0)

## 2014-11-21 LAB — POCT I-STAT, CHEM 8
BUN: 36 mg/dL — ABNORMAL HIGH (ref 6–23)
BUN: 38 mg/dL — AB (ref 6–23)
Calcium, Ion: 1.07 mmol/L — ABNORMAL LOW (ref 1.12–1.23)
Calcium, Ion: 1.08 mmol/L — ABNORMAL LOW (ref 1.12–1.23)
Chloride: 112 mmol/L (ref 96–112)
Chloride: 112 mmol/L (ref 96–112)
Creatinine, Ser: 2.4 mg/dL — ABNORMAL HIGH (ref 0.50–1.10)
Creatinine, Ser: 2.7 mg/dL — ABNORMAL HIGH (ref 0.50–1.10)
GLUCOSE: 94 mg/dL (ref 70–99)
Glucose, Bld: 90 mg/dL (ref 70–99)
HCT: 42 % (ref 36.0–46.0)
HEMATOCRIT: 43 % (ref 36.0–46.0)
Hemoglobin: 14.6 g/dL (ref 12.0–15.0)
Hemoglobin: 14.3 g/dL (ref 12.0–15.0)
POTASSIUM: 3.7 mmol/L (ref 3.5–5.1)
Potassium: 3.7 mmol/L (ref 3.5–5.1)
SODIUM: 145 mmol/L (ref 135–145)
SODIUM: 145 mmol/L (ref 135–145)
TCO2: 17 mmol/L (ref 0–100)
TCO2: 17 mmol/L (ref 0–100)

## 2014-11-21 LAB — BASIC METABOLIC PANEL
ANION GAP: 11 (ref 5–15)
ANION GAP: 8 (ref 5–15)
Anion gap: 9 (ref 5–15)
Anion gap: 12 (ref 5–15)
Anion gap: 7 (ref 5–15)
BUN: 32 mg/dL — ABNORMAL HIGH (ref 6–23)
BUN: 34 mg/dL — ABNORMAL HIGH (ref 6–23)
BUN: 36 mg/dL — AB (ref 6–23)
BUN: 39 mg/dL — AB (ref 6–23)
BUN: 42 mg/dL — ABNORMAL HIGH (ref 6–23)
CALCIUM: 7.4 mg/dL — AB (ref 8.4–10.5)
CALCIUM: 7.5 mg/dL — AB (ref 8.4–10.5)
CALCIUM: 7.5 mg/dL — AB (ref 8.4–10.5)
CALCIUM: 7.6 mg/dL — AB (ref 8.4–10.5)
CHLORIDE: 114 mmol/L — AB (ref 96–112)
CHLORIDE: 112 mmol/L (ref 96–112)
CHLORIDE: 114 mmol/L — AB (ref 96–112)
CO2: 16 mmol/L — ABNORMAL LOW (ref 19–32)
CO2: 18 mmol/L — ABNORMAL LOW (ref 19–32)
CO2: 19 mmol/L (ref 19–32)
CO2: 19 mmol/L (ref 19–32)
CO2: 19 mmol/L (ref 19–32)
Calcium: 7.6 mg/dL — ABNORMAL LOW (ref 8.4–10.5)
Chloride: 112 mmol/L (ref 96–112)
Chloride: 112 mmol/L (ref 96–112)
Creatinine, Ser: 2.24 mg/dL — ABNORMAL HIGH (ref 0.50–1.10)
Creatinine, Ser: 2.35 mg/dL — ABNORMAL HIGH (ref 0.50–1.10)
Creatinine, Ser: 2.71 mg/dL — ABNORMAL HIGH (ref 0.50–1.10)
Creatinine, Ser: 2.82 mg/dL — ABNORMAL HIGH (ref 0.50–1.10)
Creatinine, Ser: 2.84 mg/dL — ABNORMAL HIGH (ref 0.50–1.10)
GFR calc Af Amer: 21 mL/min — ABNORMAL LOW (ref >90)
GFR calc Af Amer: 22 mL/min — ABNORMAL LOW (ref >90)
GFR calc Af Amer: 26 mL/min — ABNORMAL LOW (ref >90)
GFR calc Af Amer: 28 mL/min — ABNORMAL LOW (ref >90)
GFR calc non Af Amer: 18 mL/min — ABNORMAL LOW (ref >90)
GFR calc non Af Amer: 18 mL/min — ABNORMAL LOW (ref >90)
GFR calc non Af Amer: 19 mL/min — ABNORMAL LOW (ref >90)
GFR calc non Af Amer: 22 mL/min — ABNORMAL LOW (ref >90)
GFR calc non Af Amer: 24 mL/min — ABNORMAL LOW (ref >90)
GFR, EST AFRICAN AMERICAN: 21 mL/min — AB (ref >90)
GLUCOSE: 130 mg/dL — AB (ref 70–99)
Glucose, Bld: 127 mg/dL — ABNORMAL HIGH (ref 70–99)
Glucose, Bld: 90 mg/dL (ref 70–99)
Glucose, Bld: 93 mg/dL (ref 70–99)
Glucose, Bld: 96 mg/dL (ref 70–99)
POTASSIUM: 3.6 mmol/L (ref 3.5–5.1)
POTASSIUM: 3.7 mmol/L (ref 3.5–5.1)
POTASSIUM: 3.6 mmol/L (ref 3.5–5.1)
Potassium: 3.3 mmol/L — ABNORMAL LOW (ref 3.5–5.1)
Potassium: 3.3 mmol/L — ABNORMAL LOW (ref 3.5–5.1)
SODIUM: 141 mmol/L (ref 135–145)
Sodium: 140 mmol/L (ref 135–145)
Sodium: 141 mmol/L (ref 135–145)
Sodium: 140 mmol/L (ref 135–145)
Sodium: 140 mmol/L (ref 135–145)

## 2014-11-21 LAB — GLUCOSE, CAPILLARY
GLUCOSE-CAPILLARY: 91 mg/dL (ref 70–99)
GLUCOSE-CAPILLARY: 68 mg/dL — AB (ref 70–99)
GLUCOSE-CAPILLARY: 119 mg/dL — AB (ref 70–99)
GLUCOSE-CAPILLARY: 89 mg/dL (ref 70–99)
Glucose-Capillary: 118 mg/dL — ABNORMAL HIGH (ref 70–99)
Glucose-Capillary: 66 mg/dL — ABNORMAL LOW (ref 70–99)

## 2014-11-21 LAB — CBC
HCT: 39.2 % (ref 36.0–46.0)
HEMOGLOBIN: 12.4 g/dL (ref 12.0–15.0)
MCH: 25.8 pg — ABNORMAL LOW (ref 26.0–34.0)
MCHC: 31.6 g/dL (ref 30.0–36.0)
MCV: 81.5 fL (ref 78.0–100.0)
PLATELETS: 157 10*3/uL (ref 150–400)
RBC: 4.81 MIL/uL (ref 3.87–5.11)
RDW: 15.2 % (ref 11.5–15.5)
WBC: 14.8 10*3/uL — AB (ref 4.0–10.5)

## 2014-11-21 LAB — HEPARIN LEVEL (UNFRACTIONATED)
HEPARIN UNFRACTIONATED: 0.42 [IU]/mL (ref 0.30–0.70)
Heparin Unfractionated: 0.36 IU/mL (ref 0.30–0.70)
Heparin Unfractionated: 0.25 IU/mL — ABNORMAL LOW (ref 0.30–0.70)
Heparin Unfractionated: 0.32 IU/mL (ref 0.30–0.70)

## 2014-11-21 LAB — HEPATIC FUNCTION PANEL
ALT: 110 U/L — ABNORMAL HIGH (ref 0–35)
AST: 158 U/L — AB (ref 0–37)
Albumin: 2.4 g/dL — ABNORMAL LOW (ref 3.5–5.2)
Alkaline Phosphatase: 68 U/L (ref 39–117)
BILIRUBIN INDIRECT: 0.4 mg/dL (ref 0.3–0.9)
BILIRUBIN TOTAL: 0.5 mg/dL (ref 0.3–1.2)
Bilirubin, Direct: 0.1 mg/dL (ref 0.0–0.5)
Total Protein: 6.7 g/dL (ref 6.0–8.3)

## 2014-11-21 LAB — MAGNESIUM: Magnesium: 2.1 mg/dL (ref 1.5–2.5)

## 2014-11-21 LAB — PHOSPHORUS: Phosphorus: 5.2 mg/dL — ABNORMAL HIGH (ref 2.3–4.6)

## 2014-11-21 MED ORDER — DEXTROSE 50 % IV SOLN
INTRAVENOUS | Status: AC
  Administered 2014-11-21: 25 mL
  Filled 2014-11-21: qty 50

## 2014-11-21 MED ORDER — DOLUTEGRAVIR SODIUM 50 MG PO TABS
50.0000 mg | ORAL_TABLET | Freq: Every day | ORAL | Status: DC
  Administered 2014-11-21 – 2014-11-22 (×2): 50 mg via ORAL
  Filled 2014-11-21 (×2): qty 1

## 2014-11-21 MED ORDER — VITAL HIGH PROTEIN PO LIQD
1000.0000 mL | ORAL | Status: DC
  Administered 2014-11-21: 1000 mL
  Administered 2014-11-21 – 2014-11-22 (×4)
  Filled 2014-11-21 (×3): qty 1000

## 2014-11-21 MED ORDER — DEXTROSE 50 % IV SOLN
INTRAVENOUS | Status: AC
  Filled 2014-11-21: qty 50

## 2014-11-21 MED ORDER — METOPROLOL TARTRATE 1 MG/ML IV SOLN
2.5000 mg | Freq: Four times a day (QID) | INTRAVENOUS | Status: DC | PRN
Start: 2014-11-21 — End: 2014-11-21
  Administered 2014-11-21: 2.5 mg via INTRAVENOUS
  Filled 2014-11-21: qty 5

## 2014-11-21 MED ORDER — METOPROLOL TARTRATE 1 MG/ML IV SOLN: 2.5000 mg | Freq: Four times a day (QID) | INTRAVENOUS | Status: DC | PRN

## 2014-11-21 MED ORDER — BUDESONIDE 0.5 MG/2ML IN SUSP
0.5000 mg | Freq: Two times a day (BID) | RESPIRATORY_TRACT | Status: DC
  Administered 2014-11-21 – 2014-11-22 (×3): 0.5 mg via RESPIRATORY_TRACT
  Filled 2014-11-21 (×5): qty 2

## 2014-11-21 MED ORDER — LOPINAVIR-RITONAVIR 400-100 MG/5ML PO SOLN
6.5000 mL | Freq: Two times a day (BID) | ORAL | Status: DC
  Administered 2014-11-21 – 2014-11-22 (×3): 520 mg
  Filled 2014-11-21 (×5): qty 10

## 2014-11-21 MED ORDER — ETRAVIRINE 100 MG PO TABS
200.0000 mg | ORAL_TABLET | Freq: Two times a day (BID) | ORAL | Status: DC
  Administered 2014-11-21 – 2014-11-22 (×4): 200 mg via ORAL
  Filled 2014-11-21 (×5): qty 2

## 2014-11-21 MED ORDER — HEPARIN (PORCINE) IN NACL 100-0.45 UNIT/ML-% IJ SOLN
1250.0000 [IU]/h | INTRAMUSCULAR | Status: DC
  Administered 2014-11-22: 1250 [IU]/h via INTRAVENOUS
  Filled 2014-11-21 (×3): qty 250

## 2014-11-21 MED ORDER — METOPROLOL TARTRATE 1 MG/ML IV SOLN
5.0000 mg | Freq: Four times a day (QID) | INTRAVENOUS | Status: DC | PRN
  Administered 2014-11-21: 2.5 mg via INTRAVENOUS
  Administered 2014-11-21 – 2014-11-22 (×2): 5 mg via INTRAVENOUS
  Filled 2014-11-21 (×3): qty 5

## 2014-11-21 MED ORDER — DEXTROSE 50 % IV SOLN
25.0000 mL | Freq: Once | INTRAVENOUS | Status: AC
  Administered 2014-11-21: 25 mL via INTRAVENOUS

## 2014-11-21 MED ORDER — DEXTROSE 5 % IV SOLN
INTRAVENOUS | Status: DC
  Administered 2014-11-21 – 2014-11-22 (×2): via INTRAVENOUS

## 2014-11-21 MED ORDER — POTASSIUM CHLORIDE 20 MEQ/15ML (10%) PO SOLN
40.0000 meq | Freq: Once | ORAL | Status: AC
  Administered 2014-11-21: 40 meq
  Filled 2014-11-21: qty 30

## 2014-11-21 MED ORDER — PRO-STAT SUGAR FREE PO LIQD
30.0000 mL | Freq: Every day | ORAL | Status: DC
  Administered 2014-11-21 – 2014-11-22 (×2): 30 mL
  Filled 2014-11-21 (×2): qty 30

## 2014-11-21 NOTE — Progress Notes (Signed)
PULMONARY / CRITICAL CARE MEDICINE   Name: [name] MRN: [id number] DOB: Jul 16, 1961    ADMISSION DATE:  11/22/2014 CONSULTATION DATE:  11/21/2014  REFERRING MD :  EDP  CHIEF COMPLAINT:  Cardiac Arrest  INITIAL PRESENTATION:  54 y.o. F brought to [facility] ED 3/15 after suffering VF cardiac arrest while at her bank.  She was shocked a total of 6 times with roughly 15 - 20 minutes of resuscitation prior to ROSC.  In ED she remained unresponsive and PCCM consulted for consideration of hypothermia.     STUDIES:  CXR 3/15 >>> mild cardiomegaly, vascular congestion. CT Head 3/15 >>>  SIGNIFICANT EVENTS: 3/15 - admitted after cardiac arrest   HISTORY OF PRESENT ILLNESS:  Pt is encephalopathic; therefore, this HPI is obtained from chart review. [name] is a 54 y.o. F with PMH as outlined below which includes known HIV.  She was in her USOH until 3/15 when she collapsed and had a witnessed cardiac arrest while at her bank that morning.  EMS was dispatched and upon their arrival, pt noted to be in VF.  She was shocked 3 times with rhythm then converting to VT.  Shocked twice more with return to VF.  Shocked a sixth time before return to NSR and ROSC.  Total ACLS time roughly 15 - 20 minutes. On arrival to ED, she remained unresponsive and was intubated for respiratory insufficiency.  PCCM contacted for consideration of hypothermia.  HIV followed by Dr. Tommy Medal.  Per his last note 08/07/14, pt was previously controlled on STRIBILD but was only taking it 1/2 of the time she was supposed to due to severe foot pain (had extensive workup with Dr. Doran Durand in ortho with initial MRI concerning for osteo; however, biopsies returning as negative / benign bone).  Med regimen deemed inappropriate by Dr. Tommy Medal so pt switched to Prezcobix and Truvada with Intelence 400mg  daily.  SUBJECTIVE: No events overnight, paralytic off since 3 AM but no response noted.  VITAL SIGNS: Temp:  [90.7 F (32.6  C)-98.6 F (37 C)] 98.6 F (37 C) (03/17 0600) Pulse Rate:  [61-83] 83 (03/17 0000) Resp:  [15-19] 18 (03/17 0645) BP: (88-132)/(10-85) 104/45 mmHg (03/17 0200) SpO2:  [100 %] 100 % (03/17 0400) Arterial Line BP: (103-134)/(55-79) 126/68 mmHg (03/17 0645) FiO2 (%):  [40 %] 40 % (03/17 0422) Weight:  [100.7 kg (222 lb 0.1 oz)] 100.7 kg (222 lb 0.1 oz) (03/17 0500)   HEMODYNAMICS: CVP:  [4 mmHg-7 mmHg] 5 mmHg   VENTILATOR SETTINGS: Vent Mode:  [-] PRVC FiO2 (%):  [40 %] 40 % Set Rate:  [18 bmp] 18 bmp Vt Set:  [440 mL] 440 mL PEEP:  [5 cmH20] 5 cmH20 Plateau Pressure:  [17 cmH20-18 cmH20] 17 cmH20   INTAKE / OUTPUT: Intake/Output      03/16 0701 - 03/17 0700 03/17 0701 - 03/18 0700   P.O. 0    I.V. (mL/kg) 1146.7 (11.4)    NG/GT 540    IV Piggyback 200    Total Intake(mL/kg) 1886.7 (18.7)    Urine (mL/kg/hr) 400 (0.2)    Emesis/NG output 120 (0)    Stool     Total Output 520     Net +1366.7           PHYSICAL EXAMINATION: General: Obese female, unresponsive, sedated. Neuro: Unresponsive, sedated, pupils pin-point, no dolls eye reflex, present corneal reflex and respiratory drive, does not withdraw to pain but able to blink. HEENT: [place]/AT.  PERRL, sclerae anicteric. Cardiovascular: RRR, no M/R/G.  Lungs: Respirations even and unlabored.  Coarse bilaterally L > R. Abdomen: BS x 4, soft, NT/ND.  Musculoskeletal: No gross deformities, no edema.  Skin: Intact, warm, no rashes.  LABS:  CBC  Recent Labs Lab 11/27/2014 2000  11/20/14 0450 11/20/14 0920 11/20/14 1630 11/21/14 0330  WBC 10.4  --  11.1*  --   --  14.8*  HGB 13.0  < > 13.4 15.0 15.6* 12.4  HCT 40.4  < > 42.0 44.0 46.0 39.2  PLT 197  --  178  --   --  157  < > = values in this interval not displayed. Coag's  Recent Labs Lab 11/15/2014 1253 11/17/2014 2021  APTT 30 29  INR 1.27 1.10   BMET  Recent Labs Lab 11/20/14 1935 11/21/14 0010 11/21/14 0330  NA 142 141 140  K 3.2* 3.3* 3.3*  CL 113*  112 112  CO2 16* 18* 19  BUN 29* 32* 34*  CREATININE 2.04* 2.24* 2.35*  GLUCOSE 127* 130* 127*   Electrolytes  Recent Labs Lab 11/20/14 0100 11/20/14 0450 11/20/14 1935 11/21/14 0010 11/21/14 0330  CALCIUM  --  7.3* 7.6* 7.5* 7.4*  MG 1.7 2.5  --   --  2.1  PHOS 3.2 3.8  --   --  5.2*   Sepsis Markers  Recent Labs Lab 11/17/2014 1211  LATICACIDVEN 13.58*   ABG  Recent Labs Lab 11/10/2014 1302 11/20/14 1213 11/21/14 0500  PHART 7.173* 7.233* 7.231*  PCO2ART 50.0* 37.4 45.2*  PO2ART 397.0* 131.0* 124.0*   Liver Enzymes  Recent Labs Lab 11/18/2014 1150 11/20/14 0450 11/21/14 0330  AST 106* 274* 158*  ALT 52* 133* 110*  ALKPHOS 70 76 68  BILITOT 0.6 0.5 0.5  ALBUMIN 2.7* 2.8* 2.4*   Cardiac Enzymes  Recent Labs Lab 11/21/2014 2000 11/20/14 0005 11/20/14 0737  TROPONINI 0.81* 1.02* 1.04*   Glucose  Recent Labs Lab 11/20/14 0942 11/20/14 1135 11/20/14 1514 11/20/14 1909 11/20/14 2335 11/21/14 0327  GLUCAP 147* 125* 110* 113* 118* 91    Imaging Ct Head Wo Contrast  11/09/2014   CLINICAL DATA:  Cardiac arrest.  Intracranial hemorrhage.  EXAM: CT HEAD WITHOUT CONTRAST  TECHNIQUE: Contiguous axial images were obtained from the base of the skull through the vertex without intravenous contrast.  COMPARISON:  MRI 05/30/2014, CT 05/30/2014  FINDINGS: Chronic left frontal infarct. Chronic infarct right cerebellum. Hypodensity centrum semiovale on the right is unchanged and consistent with chronic ischemia.  Negative for acute infarct. Negative for hemorrhage or mass. Ventricle size is normal.  Calvarium intact.  Scleral banding right eye  IMPRESSION: Chronic ischemic changes as above. No acute abnormality. No intracranial hemorrhage.   Electronically Signed   By: Franchot Gallo M.D.   On: 11/18/2014 14:28   Dg Chest Port 1 View  11/21/2014   CLINICAL DATA:  Respiratory failure  EXAM: PORTABLE CHEST - 1 VIEW  COMPARISON:  11/20/2014  FINDINGS: Endotracheal tube  tip is at the level of the clavicular heads. Left jugular central line extends into the region of the cavoatrial junction. There is unchanged cardiomegaly. There is vascular and interstitial congestive change. There is persistent left base consolidation without significant interval change.  IMPRESSION: Support equipment appears satisfactorily positioned. No significant interval change.   Electronically Signed   By: Andreas Newport M.D.   On: 11/21/2014 06:46   Dg Chest Port 1 View  11/20/2014   CLINICAL DATA:  Endotracheal tube evaluation  EXAM: PORTABLE CHEST - 1 VIEW  COMPARISON:  12/04/2014  FINDINGS: Endotracheal tube with tip 2 cm above the carina. No change left internal jugular central line. NG tube again enters the stomach.  Moderate cardiac enlargement with mild vascular congestion. Increased opacity at both lung bases when compared to prior study.  IMPRESSION: Lines and tubes as described. Increased vascular congestion. Hazy bibasilar densities could represent a combination of airspace opacities and pleural effusions.   Electronically Signed   By: Skipper Cliche M.D.   On: 11/20/2014 13:22   Dg Chest Portable 1 View  11/26/2014   CLINICAL DATA:  Check central line placement  EXAM: PORTABLE CHEST - 1 VIEW  COMPARISON:  11/09/2014  FINDINGS: Endotracheal tube, nasogastric catheter and new left jugular central line are within normal limits. The central line tip is noted at the cavoatrial junction. No pneumothorax is seen. Monitoring device is again noted. The inspiratory effort is stable. Increasing congestion and mild interstitial edema is noted.  IMPRESSION: Tubes and lines as described.  Increasing vascular congestion and interstitial edema.   Electronically Signed   By: Inez Catalina M.D.   On: 12/03/2014 12:53   Dg Chest Portable 1 View  11/07/2014   CLINICAL DATA:  Post cardiac arrest. CP are performed. Seizure prior to arrest.  EXAM: PORTABLE CHEST - 1 VIEW  COMPARISON:  05/30/2014   FINDINGS: Endotracheal tube is in place with tip approximately 2.4 cm above carina. Nasogastric tube is in place with tip beyond the imaged but beyond the gastroesophageal junction. The  Shallow lung inflation. Heart is enlarged. There is mild pulmonary vascular congestion without overt edema. No pneumothorax. No acute displaced fractures identified.  IMPRESSION: Mild cardiomegaly.  Pulmonary vascular congestion.  Endotracheal Tube and nasogastric tube placed.   Electronically Signed   By: Nolon Nations M.D.   On: 11/18/2014 12:27    ASSESSMENT / PLAN:  PULMONARY OETT 3/15 >>> A: Acute respiratory failure following cardiac arrest COPD without evidence of exacerbation Respiratory acidosis. P:   Full mechanical support, 8cc/kg. Increase RR to 24 with ABG at 9 AM. ABG now. VAP bundle. Hold SBT until neuro sees for prognostication. DuoNebs / Albuterol / Budesonide in lieu of outpatient beclomethasone. ABG and CXR in AM.  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 Goal CVP 8 - 12. Trend troponin / lactate. Cortisol 11.8, will continue stress dose steroids while on pressors. TTE. UDS positive for benzos and cocaine. Heparin gtt in lieu of outpatient eliquis. Hold outpatient apixaban, carvedilol, HCTZ, imdur, lisinopril, rosuvastatin.  RENAL A:   AGMA - lactate AKI At risk for metabolic derangements due to cold diuresis during hypothermia  Pseudohypocalcemia Hypokalemia P:   KVO IVF. BMP in AM. Trend lactate. Replace electrolytes as indicated.  GASTROINTESTINAL A:   Mild transaminitis GI prophylaxis Nutrition P:   LFT's in AM. SUP: Pantoprazole. Consult nutrition for TF.  HEMATOLOGIC A:   Anemia VTE Prophylaxis P:  Transfuse per usual  ICU guidelines. SCD's / Heparin gtt. Coags q8hrs during hypothermia protocol. CBC in AM.  INFECTIOUS A:   Leukocytosis - no indication of infection at this point, likely acute phase reactant HIV - followed by Dr. Tommy Medal P:   Monitor clinically. ID consulted appreciated.  ENDOCRINE A:   Anticipate hyperglycemia during hypothermia protocol Hypothyroidism - not on outpatient meds P:   ICU hyperglycemia protocol. TSH.  NEUROLOGIC A:   Acute anoxic encephalopathy Hx CVA, Depression, chronic lower back pain Positive UDS in past P:   Sedation:  Fentanyl gtt / Midazolam gtt. RASS goal: 0. Daily WUA until rewarming phase. EEG noted with burst suppression and diffuse slowness. Hold outpatient ambien, trazodone, percocet, morphine, gabapentin. Will need to have neuro come back and reevaluate prior to finalization of any plans.  Family updated: None.  Interdisciplinary Family Meeting v Palliative Care Meeting:  Due by: 3/21.  The patient is critically ill with multiple organ systems failure and requires high complexity decision making for assessment and support, frequent evaluation and titration of therapies, application of advanced monitoring technologies and extensive interpretation of multiple databases.   Critical Care Time devoted to patient care services described in this note is  35  Minutes. This time reflects time of care of this signee Dr Jennet Maduro. This critical care time does not reflect procedure time, or teaching time or supervisory time of PA/NP/Med student/Med Resident etc but could involve care discussion time.  Rush Farmer, M.D. [facility] Pulmonary/Critical Care Medicine. Pager: [phone]. After hours pager: [phone].  11/21/2014, 7:45 AM

## 2014-11-21 NOTE — Progress Notes (Signed)
eLink Physician-Brief Progress Note Patient Name: [name] DOB: [birth year] MRN: [id number]   Date of Service  11/21/2014  HPI/Events of Note  Hypoglycemic events X 2. Blood glucose = 66 treated with 1/2 amp D50 and Blood glucose = 68 also treated with 1/2 amp D50.  eICU Interventions  Will order D5W to run IV at 40 mL/hour. Wean D5W drip off when enteral nutrition at goal and blood glucose improved.     Intervention Category Intermediate Interventions: Other: Minor Interventions: Communication with other healthcare providers and/or family  Lysle Dingwall 11/21/2014, 11:51 PM

## 2014-11-21 NOTE — Progress Notes (Signed)
Hypoglycemic Event  CBG: 68  Treatment: D50 IV 25 mL  Symptoms: None  Follow-up CBG: Time: 4935 CBG Result:119  Possible Reasons for Event: Unknown  Comments/MD notified: E-Link MD notified    [name]  Remember to initiate Hypoglycemia Order Set & complete

## 2014-11-21 NOTE — Progress Notes (Signed)
ANTICOAGULATION CONSULT NOTE - Follow Up Consult  Pharmacy Consult for heparin Indication: atrial fibrillation  Labs:  Recent Labs  11/22/2014 1150  11/13/2014 1253 11/13/2014 1435  12/05/2014 2000  11/08/2014 2021  11/20/14 0005 11/20/14 0450 11/20/14 0737 11/20/14 0920 11/20/14 1630 11/20/14 1800 11/20/14 1935 11/21/14 0010 11/21/14 0155  HGB 10.5*  --   --   --   < > 13.0  < >  --   < >  --  13.4  --  15.0 15.6*  --   --   --   --   HCT 35.6*  --   --   --   < > 40.4  < >  --   < >  --  42.0  --  44.0 46.0  --   --   --   --   PLT 170  --   --   --   --  197  --   --   --   --  178  --   --   --   --   --   --   --   APTT  --   --  30  --   --   --   --  29  --   --   --   --   --   --   --   --   --   --   LABPROT  --   --  16.1*  --   --   --   --  14.3  --   --   --   --   --   --   --   --   --   --   INR  --   --  1.27  --   --   --   --  1.10  --   --   --   --   --   --   --   --   --   --   HEPARINUNFRC  --   --   --  <0.10*  --   --   --   --   --   --   --   --   --   --  0.38  --   --  0.36  CREATININE 1.45*  --  1.28* 1.21*  < > 1.26*  < > 1.23*  < > 1.36* 1.54*  --  1.40* 1.30*  --  2.04* 2.24*  --   TROPONINI  --   < > 0.11*  --   --  0.81*  --   --   --  1.02*  --  1.04*  --   --   --   --   --   --   < > = values in this interval not displayed.   Assessment/Plan:  54yo female therapeutic on heparin after rate increase to maintain levels during rewarming. Will continue gtt at current rate and confirm stable with additional level.   Wynona Neat, PharmD, BCPS  11/21/2014,3:04 AM

## 2014-11-21 NOTE — Progress Notes (Signed)
NUTRITION FOLLOW-UP  INTERVENTION: Initiate Vital High Protein at 20 ml and Prostat 30 ml daily and increase by 10 ml every 4 hours to goal rate of 45 ml/hr  Tube feeding regimen provides 1180 kcal (67% of estimated needs), 110 grams of protein, and 903 ml of free water  NUTRITION DIAGNOSIS: Inadequate oral intake related to inability to eat as evidenced by vent status; ongoing  Goal: Enteral nutrition to provide 60-70% of estimated calorie needs (22-25 kcal/kg ideal body weight) and 100% of estimated protein needs, based on ASPEN guidelines for hypocaloric, high protein feeding in critically ill obese individuals; not met  Monitor:  Vent status, TF tolerance/adequacy, weight trends, labs  ASSESSMENT: 54 y/o female brought to [facility] ED 3/15 after suffering VF cardiac arrest while at her bank. Pt was shocked 6 times with roughly 15-20 minutes of resuscitation prior to ROSC. In ED, she remained unresponsive and PCCM consulted for hypothermia protocol.   Consulted for enteral/TF initiation and management Pt is currently intubated on ventilator support with OGT in place. Original order to start TF was too early to initiate; canceled order yesterday 3/16. Will place order based on current status. Hypothermia protocol; rewarmed.  Minute Ventilation: 11.2 Max Temperature: 37  Propofol: off  Labs- high BUN, creatinine   Height: Ht Readings from Last 1 Encounters:  07/08/14 5' 2"  (1.575 m)    Weight: Wt Readings from Last 1 Encounters:  11/21/14 222 lb 0.1 oz (100.7 kg)    BMI:  Body mass index is 38.09 kg/(m^2). Obesity class II  Estimated Nutritional Needs: Kcal: 1744 22-25 kcal/kg ideal: 7672-0947 Protein: 109-125 grams Fluid: >/= 1.5L daily  Skin: edema L/E  Diet Order:    EDUCATION NEEDS: -No education needs identified at this time   Intake/Output Summary (Last 24 hours) at 11/21/14 1009 Last data filed at 11/21/14 1000  Gross per 24 hour  Intake 1541.55 ml  Output     568 ml  Net 973.55 ml    Last BM: 3/15  Labs:   Recent Labs Lab 11/20/14 0100 11/20/14 0450  11/20/14 1935 11/21/14 0010 11/21/14 0330 11/21/14 0743  NA  --  141  < > 142 141 140 145  K  --  3.0*  < > 3.2* 3.3* 3.3* 3.7  CL  --  116*  < > 113* 112 112 112  CO2  --  19  --  16* 18* 19  --   BUN  --  22  < > 29* 32* 34* 36*  CREATININE  --  1.54*  < > 2.04* 2.24* 2.35* 2.40*  CALCIUM  --  7.3*  --  7.6* 7.5* 7.4*  --   MG 1.7 2.5  --   --   --  2.1  --   PHOS 3.2 3.8  --   --   --  5.2*  --   GLUCOSE  --  144*  < > 127* 130* 127* 94  < > = values in this interval not displayed.  CBG (last 3)   Recent Labs  11/20/14 1909 11/20/14 2335 11/21/14 0327  GLUCAP 113* 118* 91    Scheduled Meds: . antiseptic oral rinse  7 mL Mouth Rinse QID  . artificial tears  1 application Both Eyes 3 times per day  . budesonide (PULMICORT) nebulizer solution  0.5 mg Nebulization BID  . chlorhexidine  15 mL Mouth Rinse BID  . fentaNYL  100 mcg Intravenous Once  . hydrocortisone sodium succinate  50 mg  Intravenous Q6H  . insulin aspart  2-6 Units Subcutaneous 6 times per day  . ipratropium-albuterol  3 mL Nebulization Q6H  . midazolam  2 mg Intravenous Once  . pantoprazole (PROTONIX) IV  40 mg Intravenous QHS  . sodium chloride  3 mL Intravenous Q12H    Continuous Infusions: . sodium chloride 10 mL/hr at 11/26/2014 1821  . sodium chloride    . cisatracurium (NIMBEX) infusion Stopped (11/21/14 0230)  . fentaNYL infusion INTRAVENOUS Stopped (11/21/14 0430)  . heparin 850 Units/hr (11/21/14 0700)  . midazolam (VERSED) infusion Stopped (11/21/14 0430)  . norepinephrine (LEVOPHED) Adult infusion 4 mcg/min (11/21/14 1006)    Wynona Dove, MS Dietetic Intern Pager: [phone]

## 2014-11-21 NOTE — Progress Notes (Signed)
Patient ID: [name], female   DOB: [birth year], 54 y.o.   MRN: [id number]         [facility]    Date of Admission:  11/08/2014     Active Problems:   Cardiac arrest   Acute respiratory failure   Anoxic encephalopathy   Cardiogenic shock   HIV (human immunodeficiency virus infection)   Paroxysmal atrial fibrillation   Anticoagulation management encounter   Ischemic cardiomyopathy   Acute on chronic systolic CHF (congestive heart failure), NYHA class 4   HIV Infection   . antiseptic oral rinse  7 mL Mouth Rinse QID  . artificial tears  1 application Both Eyes 3 times per day  . budesonide (PULMICORT) nebulizer solution  0.5 mg Nebulization BID  . chlorhexidine  15 mL Mouth Rinse BID  . fentaNYL  100 mcg Intravenous Once  . hydrocortisone sodium succinate  50 mg Intravenous Q6H  . insulin aspart  2-6 Units Subcutaneous 6 times per day  . ipratropium-albuterol  3 mL Nebulization Q6H  . midazolam  2 mg Intravenous Once  . pantoprazole (PROTONIX) IV  40 mg Intravenous QHS  . sodium chloride  3 mL Intravenous Q12H    Past Medical History  Diagnosis Date  . Cholecystitis     Gall bladder removed   . Hypercholesterolemia   . Fibroids   . HIV (human immunodeficiency virus infection) 05/27/11    CD4 460, VL undetectable 10/12/12  . Pelvic pain in female 10/14/11  . PMB (postmenopausal bleeding)     Since 2010  . Increased BMI 05/27/11  . Anxiety   . Depression   . Hypertension   . H/O varicella   . History of measles, mumps, or rubella   . Blood transfusion 1995    "related to losing blood in urine during pregnancy"  . Hypothyroidism     Reports h/o hypothyroidism; not on any meds, TSH wnl over several years  . 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 Myocardial infarction 07/2004  . COPD (chronic obstructive pulmonary disease)     well controlled, only using albuterol  inhaler occasionally   . Iron deficiency anemia   . Lower GI bleed 04/24/2012    from hemorrhoids. No recent colonoscopy   . CAD in native artery     s/p stent and restenting of LAD. On plavix  . Anginal pain   . Pneumonia 1980's    "once"  . Chronic bronchitis     "get it q yr" (09/14/2013)  . Stroke 12/2011    R insular ischemic CVA ; residual "speech messes up when I get sick or real tired" (09/14/2013)  . Arthritis     "knees; left shoulder; right anklef/foot" (09/14/2013)  . Bursitis, shoulder     "right"  . Chronic lower back pain   . Carpal tunnel syndrome, bilateral     wears slint bilateral  . History of loop recorder     History  Substance Use Topics  . Smoking status: Former Smoker -- 0.50 packs/day for 29 years    Types: Cigarettes    Quit date: 05/07/2006  . Smokeless tobacco: Never Used  . Alcohol Use: 0.0 oz/week     Comment: Occasional drinker. Wine    Family History  Problem Relation Age of Onset  . Hypertension Mother   . Hyperlipidemia Mother   . Obesity Mother   .  Heart disease Mother   . Hypertension Maternal Aunt   . Hyperlipidemia Maternal Aunt   . Obesity Maternal Aunt   . Stroke Maternal Aunt    Allergies  Allergen Reactions  . Atorvastatin Other (See Comments)    Other reaction(s): Other (See Comments) Problems with memory  Patient states that the medication affects her memory    OBJECTIVE: Blood pressure 104/45, pulse 83, temperature 99.1 F (37.3 C), temperature source Core (Comment), resp. rate 24, height 5\' 4"  (1.626 m), weight 222 lb 0.1 oz (100.7 kg), last menstrual period 12/27/2012, SpO2 100 %.   General: She remains unresponsive on the ventilator Lungs: Clear Cor: Regular S1 and S2 with no murmurs  Lab Results Lab Results  Component Value Date   WBC 14.8* 11/21/2014   HGB 14.6 11/21/2014   HCT 43.0 11/21/2014   MCV 81.5 11/21/2014   PLT 157 11/21/2014    Lab Results  Component Value Date   CREATININE 2.40* 11/21/2014    BUN 36* 11/21/2014   NA 145 11/21/2014   K 3.7 11/21/2014   CL 112 11/21/2014   CO2 19 11/21/2014    Lab Results  Component Value Date   ALT 110* 11/21/2014   AST 158* 11/21/2014   ALKPHOS 68 11/21/2014   BILITOT 0.5 11/21/2014    Urine drug screen 11/27/2014: Positive for cocaine and benzodiazepines  HIV 1 RNA QUANT (copies/mL)  Date Value  08/07/2014 207*  01/16/2014 <20  10/25/2013 1332*   CD4 T CELL ABS (/uL)  Date Value  11/17/2014 460  08/07/2014 380*  01/16/2014 420   [employer] genotype database shows:  Drug Resistance Interpretation: PR PI Major Resistance Mutations:None PI Minor Resistance Mutations:None Other Mutations:L63P Protease Inhibitors atazanavir/r (ATV/r)Susceptible darunavir/r (DRV/r)Susceptible fosamprenavir/r (FPV/r)Susceptible indinavir/r (IDV/r)Susceptible lopinavir/r (LPV/r)Susceptible nelfinavir (NFV)Susceptible saquinavir/r (SQV/r)Susceptible tipranavir/r (TPV/r)Susceptible PR Comments  Drug Resistance Interpretation: RT NRTI Resistance Mutations:D67N, K70R, M184V, T215F, K219E NNRTI Resistance Mutations:K103N Other Mutations:None Nucleoside RTI lamivudine (3TC)High-level resistance abacavir (ABC)High-level resistance zidovudine (AZT)High-level resistance stavudine (D4T)High-level resistance didanosine (DDI)Intermediate resistance emtricitabine (FTC)High-level resistance tenofovir (TDF)Intermediate resistance  Assessment: I have stopped her outpatient antiretroviral regimen because of worsening renal function. I have reviewed salvage options with our infectious disease pharmacist, Minh Pham and Cassie Stewart. We will re-start etravirine which can be dissolved and put down her NG tube, dolutegravir which can be crushed and put down her NG tube and Kaletra  suspension.  Plan: 1. Change antiretroviral regimen to etravirine, dolutegravir and Kaletra 2. Please call me for any questions between now and Monday, 11/25/2014  Michel Bickers, [facility] [phone] pager   [phone] cell 11/21/2014, 10:34 AM

## 2014-11-21 NOTE — Progress Notes (Addendum)
Patient Name: [name] Date of Encounter: 11/21/2014  Active Problems:   Cardiac arrest   Acute respiratory failure   Anoxic encephalopathy   Cardiogenic shock   HIV (human immunodeficiency virus infection)   Paroxysmal atrial fibrillation   Anticoagulation management encounter   Ischemic cardiomyopathy   Acute on chronic systolic CHF (congestive heart failure), NYHA class 4   Length of Stay: 2  SUBJECTIVE  Sedated, paralyzed. Rewarmed at 5 am.   CURRENT MEDS . antiseptic oral rinse  7 mL Mouth Rinse QID  . artificial tears  1 application Both Eyes 3 times per day  . budesonide (PULMICORT) nebulizer solution  0.5 mg Nebulization BID  . chlorhexidine  15 mL Mouth Rinse BID  . fentaNYL  100 mcg Intravenous Once  . hydrocortisone sodium succinate  50 mg Intravenous Q6H  . insulin aspart  2-6 Units Subcutaneous 6 times per day  . ipratropium-albuterol  3 mL Nebulization Q6H  . midazolam  2 mg Intravenous Once  . pantoprazole (PROTONIX) IV  40 mg Intravenous QHS  . potassium chloride  40 mEq Per Tube Once  . sodium chloride  3 mL Intravenous Q12H   . sodium chloride 10 mL/hr at 11/17/2014 1821  . sodium chloride    . cisatracurium (NIMBEX) infusion Stopped (11/21/14 0230)  . fentaNYL infusion INTRAVENOUS Stopped (11/21/14 0430)  . heparin 850 Units/hr (11/21/14 0700)  . midazolam (VERSED) infusion Stopped (11/21/14 0430)  . norepinephrine (LEVOPHED) Adult infusion 5.973 mcg/min (11/21/14 0700)    OBJECTIVE  Filed Vitals:   11/21/14 0700 11/21/14 0800 11/21/14 0808 11/21/14 0817  BP:      Pulse:      Temp:  99.1 F (37.3 C)    TempSrc:  Core (Comment)    Resp: 18 18    Height:      Weight:      SpO2:  100% 100% 100%    Intake/Output Summary (Last 24 hours) at 11/21/14 0854 Last data filed at 11/21/14 0800  Gross per 24 hour  Intake 1760.11 ml  Output    530 ml  Net 1230.11 ml   Filed Weights   11/05/2014 1440 11/20/14 0400 11/21/14 0500  Weight:  220 lb 14.4 oz (100.2 kg) 217 lb 9.9 oz (98.71 kg) 222 lb 0.1 oz (100.7 kg)    PHYSICAL EXAM  General: sedated, paralyzed HEENT:  ET tube  Neck: Supple without bruits , unable to assess JVD. Lungs:  Resp regular and unlabored, crackles B/L Heart: RRR no s3, s4, or murmurs. Abdomen: Soft, non-tender, non-distended, BS + x 4.  Extremities: No clubbing, cyanosis or edema. DP/PT/Radials 2+ and equal bilaterally.  Accessory Clinical Findings  CBC  Recent Labs  12/05/2014 1150  11/20/14 0450  11/21/14 0330 11/21/14 0743  WBC 13.4*  < > 11.1*  --  14.8*  --   NEUTROABS 5.4  --   --   --   --   --   HGB 10.5*  < > 13.4  < > 12.4 14.6  HCT 35.6*  < > 42.0  < > 39.2 43.0  MCV 85.6  < > 81.9  --  81.5  --   PLT 170  < > 178  --  157  --   < > = values in this interval not displayed. Basic Metabolic Panel  Recent Labs  11/20/14 0450  11/21/14 0010 11/21/14 0330 11/21/14 0743  NA 141  < > 141 140 145  K 3.0*  < >  3.3* 3.3* 3.7  CL 116*  < > 112 112 112  CO2 19  < > 18* 19  --   GLUCOSE 144*  < > 130* 127* 94  BUN 22  < > 32* 34* 36*  CREATININE 1.54*  < > 2.24* 2.35* 2.40*  CALCIUM 7.3*  < > 7.5* 7.4*  --   MG 2.5  --   --  2.1  --   PHOS 3.8  --   --  5.2*  --   < > = values in this interval not displayed. Liver Function Tests  Recent Labs  11/20/14 0450 11/21/14 0330  AST 274* 158*  ALT 133* 110*  ALKPHOS 76 68  BILITOT 0.5 0.5  PROT 7.1 6.7  ALBUMIN 2.8* 2.4*   No results for input(s): LIPASE, AMYLASE in the last 72 hours. Cardiac Enzymes  Recent Labs  11/12/2014 2000 11/20/14 0005 11/20/14 0737  TROPONINI 0.81* 1.02* 1.04*    Recent Labs  12/04/2014 1253  TSH 6.384*    Radiology/Studies  Ct Head Wo Contrast  11/15/2014   CLINICAL DATA:  Cardiac arrest.  Intracranial hemorrhage.   IMPRESSION: Chronic ischemic changes as above. No acute abnormality. No intracranial hemorrhage.      Dg Chest Portable 1 View  12/02/2014   CLINICAL DATA:  Check central  line placement    IMPRESSION: Tubes and lines as described.  Increasing vascular congestion and interstitial edema.     Dg Chest Portable 1 View  11/20/2014   CLINICAL DATA:  Post cardiac arrest. CP are performed. Seizure prior to arrest.  IMPRESSION: Mild cardiomegaly.  Pulmonary vascular congestion.  Endotracheal Tube and nasogastric tube placed.    TELE: SR, frequent PVCs, bigeminy  ECG: SR, neg T waves in the anterior leads, new Q wave in the lateral leads, long QT/QTc 606/579 ms  Left cardiac cath: 11/25/2014 Left mainstem: Normal  Left anterior descending (LAD): The LAD has a stent after the first diagonal. It is occluded. There is recanalization into the second diagonal. The LAD fills by right to left collaterals. The first diagonal is normal.   Left circumflex (LCx): Normal  Right coronary artery (RCA): 30% proximal stenosis.   Left ventriculography: not done due to high EDP  Final Conclusions:  1. Single vessel occlusive CAD. 2. High LV EDP  Recommendations: Medical management. Compared to cardiac cath in 2008 there has been partial recanalization of the LAD occlusion with flow now into the second diagonal. The remainder of the LAD fills by right to left collaterals. There is no new disease to explain her arrest today.   Echocardiogram: 11/20/2014  Left ventricle: Severe diffuse hypokinesis. Akinesis of the apex with mild dyskinesis. The cavity size was mildly to moderately dilated. Wall thickness was increased in a pattern of mild LVH. The estimated ejection fraction was 15%. Contrast was used. No definite clot was seen in the apex. - Right ventricle: The cavity size was normal. Systolic function was normal. - Impressions: Global wall motion is worse than the prior study. Pleaural effusion is present.  Impressions: - Global wall motion is worse than the prior study. Pleaural effusion is present.    ASSESSMENT AND PLAN  The patient is a 54yo  female with a history of CAD with stent In LAD at [place] long time ago? More recent stent at [facility] in 2008 (in-stent restenosis). In hospital 4/13 with TIA. TEE by Dr Stanford Breed with small PFO no SOE. Her last echo was 05/2014 with EF 30-35%, Akinesis  of the anteroseptal, anterior, and apical myocardium, moderate diffuse hypokinesis, LA moderately dilated. Her history also includes Cocaine use(+ in sept 2015),HLD, HTN, OSA(apparently unreceptive to CPAP), COPD, CHF, HIV(followed by Dr. Tommy Medal), Anxiety, depression, loop recorder(09/2013). She's been followed by Dr. Doran Durand for osteomyelitis. She was brought in by EMS after having a V[place]. Shock x6. 15-20 minutes of CPR before ROSC. Initial troponin 0.11. She was started on hypothermia protocol.   1. CAD, cath showed occluded LAD with right to left collaterals, high LVEDP, Troponin 0.8 --> 1.04, unable to use ASA, statins (elevated LFTs) and betablockers (hypotension on levophed). Possibly triggered by cocaine use. Not tested during this visit but positive in September 2015. Echo from yesterday shows Severe diffuse hypokinesis with akinesis of the apex with mild dyskinesis and overall LVEF 15%. The cavity size was mildly to moderately dilated. No definite clot was seen in the apex. Poor prognosis, severely decreased function possibly due to prolonged CPR and hypoxia.  2. Prolonged QT/QTc, this is new change, most probably related to hypokalemia that is being treated, I have reviewed the list of her meds with the pharmacist and there is no red flag. We will keep following, No VTs on telemetry.  3. Parox a-fibrillation - now in SR, on Eliquis in the past, seems that she wasn't complaint (our pharmacist found out that she never refilled her prescription). She had an episode yesterday.  Her CHADS-VASc is 5, including prior h/o stroke, started on iv Heparin.   4. Ischemic cardiomyopathy - LVEF 15%, unable to start ACEI or BB  right now.   5. Acute on chronic systolic CHF - she is fluid overloaded, she is still hypothermic and on 5 mcg of levophed, positive balance 4 L in the last 48 hours, ARI, I would wait until she is being rewarmed.  6. Sinus tachycardia - still on levophed, can add metoprolol 2.5 mg iv Q6H PRN  7 Hypokalemia - resolved  8.Prognosis - poor based on EEG showing burst suppression and diffuse slowness.  Signed, Dorothy Spark MD, [facility] 11/21/2014

## 2014-11-21 NOTE — Progress Notes (Addendum)
ANTICOAGULATION CONSULT NOTE - Follow Up Consult  Pharmacy Consult for heparin Indication: atrial fibrillation  Allergies  Allergen Reactions  . Atorvastatin Other (See Comments)    Other reaction(s): Other (See Comments) Problems with memory  Patient states that the medication affects her memory    Patient Measurements: Height: 5\' 4"  (162.6 cm) Weight: 222 lb 0.1 oz (100.7 kg) IBW/kg (Calculated) : 54.7 Heparin Dosing Weight: 78kg  Vital Signs: Temp: 99.1 F (37.3 C) (03/17 1000) Temp Source: Core (Comment) (03/17 1000) BP: 104/45 mmHg (03/17 0200) Pulse Rate: 83 (03/17 0000)  Labs:  Recent Labs  12/03/2014 1253  12/05/2014 2000  11/30/2014 2021  11/20/14 0005 11/20/14 0450 11/20/14 0737  11/20/14 1630  11/21/14 0010 11/21/14 0155 11/21/14 0330 11/21/14 0743 11/21/14 0930  HGB  --   < > 13.0  < >  --   < >  --  13.4  --   < > 15.6*  --   --   --  12.4 14.6  --   HCT  --   < > 40.4  < >  --   < >  --  42.0  --   < > 46.0  --   --   --  39.2 43.0  --   PLT  --   --  197  --   --   --   --  178  --   --   --   --   --   --  157  --   --   APTT 30  --   --   --  29  --   --   --   --   --   --   --   --   --   --   --   --   LABPROT 16.1*  --   --   --  14.3  --   --   --   --   --   --   --   --   --   --   --   --   INR 1.27  --   --   --  1.10  --   --   --   --   --   --   --   --   --   --   --   --   HEPARINUNFRC  --   < >  --   --   --   --   --   --   --   --   --   < >  --  0.36 0.42  --  0.25*  CREATININE 1.28*  < > 1.26*  < > 1.23*  < > 1.36* 1.54*  --   < > 1.30*  < > 2.24*  --  2.35* 2.40*  --   TROPONINI 0.11*  --  0.81*  --   --   --  1.02*  --  1.04*  --   --   --   --   --   --   --   --   < > = values in this interval not displayed.  Estimated Creatinine Clearance: 31.3 mL/min (by C-G formula based on Cr of 2.4).   Medications:  Scheduled:  . antiseptic oral rinse  7 mL Mouth Rinse QID  . artificial tears  1 application Both Eyes 3 times per day  .  budesonide (PULMICORT) nebulizer solution  0.5 mg Nebulization BID  . chlorhexidine  15 mL Mouth Rinse BID  . dolutegravir  50 mg Oral Daily  . etravirine  200 mg Oral BID WC  . fentaNYL  100 mcg Intravenous Once  . hydrocortisone sodium succinate  50 mg Intravenous Q6H  . insulin aspart  2-6 Units Subcutaneous 6 times per day  . ipratropium-albuterol  3 mL Nebulization Q6H  . lopinavir-ritonavir  6.5 mL Per Tube BID AC  . midazolam  2 mg Intravenous Once  . pantoprazole (PROTONIX) IV  40 mg Intravenous QHS  . sodium chloride  3 mL Intravenous Q12H    Assessment: 54 yo female s/p cardiac arrest (hypothermia protocol; rewarmed 3/16) and noted with PAF. She is reported on apixiban PTA but has it appears she has not been compliant.   Heparin is at  850 units/hr and heparin level is below goal (HL= 0.25).  Goal of Therapy:  Heparin level 0.3-0.7 units/ml Monitor platelets by anticoagulation protocol: Yes   Plan:  -Increase heparin to 1050 units/hr -Heparin level in 8 hours and daily with CBC daily  Hildred Laser, Pharm D 11/21/2014 11:36 AM   ===================================   Addendum: - HL 0.32, therapeutic but drawn 1.5 hrs early - no bleeding reported   Plan: - Increase heparin gtt slightly to 1100 units/hr - F/U AM labs    Dawson Hollman D. Mina Marble, PharmD, BCPS Pager:  [phone] 11/21/2014, 8:33 PM

## 2014-11-22 ENCOUNTER — Encounter: Payer: Self-pay | Admitting: Cardiology

## 2014-11-22 ENCOUNTER — Inpatient Hospital Stay (HOSPITAL_COMMUNITY): Payer: Commercial Managed Care - HMO

## 2014-11-22 DIAGNOSIS — I10 Essential (primary) hypertension: Secondary | ICD-10-CM

## 2014-11-22 LAB — BLOOD GAS, ARTERIAL
Acid-base deficit: 10.1 mmol/L — ABNORMAL HIGH (ref 0.0–2.0)
Bicarbonate: 14.9 mEq/L — ABNORMAL LOW (ref 20.0–24.0)
DRAWN BY: 235321
FIO2: 0.3 %
LHR: 24 {breaths}/min
MECHVT: 440 mL
O2 Saturation: 95.3 %
PCO2 ART: 29.9 mmHg — AB (ref 35.0–45.0)
PEEP: 5 cmH2O
Patient temperature: 97.9
TCO2: 15.9 mmol/L (ref 0–100)
pH, Arterial: 7.316 — ABNORMAL LOW (ref 7.350–7.450)
pO2, Arterial: 82.5 mmHg (ref 80.0–100.0)

## 2014-11-22 LAB — POCT I-STAT 3, ART BLOOD GAS (G3+)
ACID-BASE DEFICIT: 6 mmol/L — AB (ref 0.0–2.0)
BICARBONATE: 18.7 meq/L — AB (ref 20.0–24.0)
O2 Saturation: 98 %
PO2 ART: 119 mmHg — AB (ref 80.0–100.0)
Patient temperature: 99.4
TCO2: 20 mmol/L (ref 0–100)
pCO2 arterial: 33.6 mmHg — ABNORMAL LOW (ref 35.0–45.0)
pH, Arterial: 7.355 (ref 7.350–7.450)

## 2014-11-22 LAB — CBC
HEMATOCRIT: 36.1 % (ref 36.0–46.0)
Hemoglobin: 12 g/dL (ref 12.0–15.0)
MCH: 26 pg (ref 26.0–34.0)
MCHC: 33.2 g/dL (ref 30.0–36.0)
MCV: 78.3 fL (ref 78.0–100.0)
Platelets: 132 10*3/uL — ABNORMAL LOW (ref 150–400)
RBC: 4.61 MIL/uL (ref 3.87–5.11)
RDW: 15.1 % (ref 11.5–15.5)
WBC: 14.6 10*3/uL — AB (ref 4.0–10.5)

## 2014-11-22 LAB — BASIC METABOLIC PANEL
Anion gap: 8 (ref 5–15)
Anion gap: 8 (ref 5–15)
Anion gap: 8 (ref 5–15)
BUN: 43 mg/dL — AB (ref 6–23)
BUN: 46 mg/dL — ABNORMAL HIGH (ref 6–23)
BUN: 48 mg/dL — ABNORMAL HIGH (ref 6–23)
CALCIUM: 7.3 mg/dL — AB (ref 8.4–10.5)
CALCIUM: 7.4 mg/dL — AB (ref 8.4–10.5)
CO2: 18 mmol/L — AB (ref 19–32)
CO2: 18 mmol/L — ABNORMAL LOW (ref 19–32)
CO2: 19 mmol/L (ref 19–32)
CREATININE: 2.76 mg/dL — AB (ref 0.50–1.10)
Calcium: 7.5 mg/dL — ABNORMAL LOW (ref 8.4–10.5)
Chloride: 112 mmol/L (ref 96–112)
Chloride: 114 mmol/L — ABNORMAL HIGH (ref 96–112)
Chloride: 115 mmol/L — ABNORMAL HIGH (ref 96–112)
Creatinine, Ser: 2.72 mg/dL — ABNORMAL HIGH (ref 0.50–1.10)
Creatinine, Ser: 2.61 mg/dL — ABNORMAL HIGH (ref 0.50–1.10)
GFR calc Af Amer: 22 mL/min — ABNORMAL LOW (ref >90)
GFR calc Af Amer: 23 mL/min — ABNORMAL LOW (ref >90)
GFR calc non Af Amer: 19 mL/min — ABNORMAL LOW (ref >90)
GFR calc non Af Amer: 20 mL/min — ABNORMAL LOW (ref >90)
GFR, EST AFRICAN AMERICAN: 21 mL/min — AB (ref >90)
GFR, EST NON AFRICAN AMERICAN: 19 mL/min — AB (ref >90)
GLUCOSE: 102 mg/dL — AB (ref 70–99)
GLUCOSE: 211 mg/dL — AB (ref 70–99)
GLUCOSE: 85 mg/dL (ref 70–99)
Potassium: 2.9 mmol/L — ABNORMAL LOW (ref 3.5–5.1)
Potassium: 3.2 mmol/L — ABNORMAL LOW (ref 3.5–5.1)
Potassium: 3.3 mmol/L — ABNORMAL LOW (ref 3.5–5.1)
SODIUM: 138 mmol/L (ref 135–145)
Sodium: 141 mmol/L (ref 135–145)
Sodium: 141 mmol/L (ref 135–145)

## 2014-11-22 LAB — GLUCOSE, CAPILLARY
GLUCOSE-CAPILLARY: 68 mg/dL — AB (ref 70–99)
GLUCOSE-CAPILLARY: 77 mg/dL (ref 70–99)
GLUCOSE-CAPILLARY: 95 mg/dL (ref 70–99)
Glucose-Capillary: 51 mg/dL — ABNORMAL LOW (ref 70–99)
Glucose-Capillary: 68 mg/dL — ABNORMAL LOW (ref 70–99)
Glucose-Capillary: 94 mg/dL (ref 70–99)
Glucose-Capillary: 75 mg/dL (ref 70–99)
Glucose-Capillary: 59 mg/dL — ABNORMAL LOW (ref 70–99)
Glucose-Capillary: 63 mg/dL — ABNORMAL LOW (ref 70–99)
Glucose-Capillary: 92 mg/dL (ref 70–99)
Glucose-Capillary: >600 mg/dL (ref 70–99)
Glucose-Capillary: >600 mg/dL (ref 70–99)
Glucose-Capillary: 140 mg/dL — ABNORMAL HIGH (ref 70–99)

## 2014-11-22 LAB — HEPARIN LEVEL (UNFRACTIONATED)
HEPARIN UNFRACTIONATED: 0.27 [IU]/mL — AB (ref 0.30–0.70)
Heparin Unfractionated: 0.6 IU/mL (ref 0.30–0.70)

## 2014-11-22 LAB — PHOSPHORUS: PHOSPHORUS: 3.8 mg/dL (ref 2.3–4.6)

## 2014-11-22 LAB — MAGNESIUM: MAGNESIUM: 2 mg/dL (ref 1.5–2.5)

## 2014-11-22 MED ORDER — MORPHINE SULFATE 25 MG/ML IV SOLN
10.0000 mg/h | INTRAVENOUS | Status: DC
  Filled 2014-11-22: qty 10

## 2014-11-22 MED ORDER — FENTANYL CITRATE 0.05 MG/ML IJ SOLN
25.0000 ug | INTRAMUSCULAR | Status: DC | PRN
  Administered 2014-11-22 – 2014-11-23 (×3): 100 ug via INTRAVENOUS
  Filled 2014-11-22 (×3): qty 2

## 2014-11-22 MED ORDER — METOPROLOL TARTRATE 1 MG/ML IV SOLN
5.0000 mg | Freq: Four times a day (QID) | INTRAVENOUS | Status: DC
  Administered 2014-11-22 (×2): 5 mg via INTRAVENOUS
  Filled 2014-11-22 (×2): qty 5

## 2014-11-22 MED ORDER — DEXTROSE 50 % IV SOLN
INTRAVENOUS | Status: AC
  Filled 2014-11-22: qty 50

## 2014-11-22 MED ORDER — FUROSEMIDE 10 MG/ML IJ SOLN
40.0000 mg | Freq: Four times a day (QID) | INTRAMUSCULAR | Status: DC
  Administered 2014-11-22 (×2): 40 mg via INTRAVENOUS
  Filled 2014-11-22 (×2): qty 4

## 2014-11-22 MED ORDER — DEXTROSE 50 % IV SOLN
25.0000 mL | Freq: Once | INTRAVENOUS | Status: AC
  Administered 2014-11-22: 25 mL via INTRAVENOUS
  Filled 2014-11-22: qty 50

## 2014-11-22 MED ORDER — DEXTROSE 50 % IV SOLN
25.0000 mL | Freq: Once | INTRAVENOUS | Status: AC
  Administered 2014-11-22: 25 mL via INTRAVENOUS

## 2014-11-22 MED ORDER — IPRATROPIUM-ALBUTEROL 0.5-2.5 (3) MG/3ML IN SOLN: 3.0000 mL | Freq: Four times a day (QID) | RESPIRATORY_TRACT | Status: DC | PRN

## 2014-11-22 MED ORDER — MORPHINE BOLUS VIA INFUSION
5.0000 mg | INTRAVENOUS | Status: DC | PRN
  Filled 2014-11-22: qty 20

## 2014-11-22 MED ORDER — POTASSIUM CHLORIDE 10 MEQ/100ML IV SOLN
10.0000 meq | INTRAVENOUS | Status: AC
  Administered 2014-11-22 (×6): 10 meq via INTRAVENOUS
  Filled 2014-11-22 (×6): qty 100

## 2014-11-22 MED ORDER — POTASSIUM CHLORIDE 10 MEQ/50ML IV SOLN
10.0000 meq | INTRAVENOUS | Status: AC
  Administered 2014-11-22 (×2): 10 meq via INTRAVENOUS
  Filled 2014-11-22 (×2): qty 50

## 2014-11-22 NOTE — Progress Notes (Signed)
ANTICOAGULATION CONSULT NOTE - Follow Up Consult  Pharmacy Consult for heparin Indication: atrial fibrillation  Allergies  Allergen Reactions  . Atorvastatin Other (See Comments)    Other reaction(s): Other (See Comments) Problems with memory  Patient states that the medication affects her memory    Patient Measurements: Height: 5\' 4"  (162.6 cm) Weight: 218 lb 5.1 oz (99.03 kg) IBW/kg (Calculated) : 54.7 Heparin Dosing Weight: 78kg  Vital Signs: Temp: 98.6 F (37 C) (03/18 1600) Temp Source: Core (Comment) (03/18 1600) BP: 107/74 mmHg (03/18 1100) Pulse Rate: 77 (03/18 1600)  Labs:  Recent Labs  11/14/2014 2000  11/16/2014 2021  11/20/14 0005 11/20/14 0450 11/20/14 0737  11/21/14 0330 11/21/14 0743  11/21/14 1144  11/21/14 1900 11/22/14 0016 11/22/14 0315 11/22/14 0500 11/22/14 0900 11/22/14 1645  HGB 13.0  < >  --   < >  --  13.4  --   < > 12.4 14.6  --  14.3  --   --   --  12.0  --   --   --   HCT 40.4  < >  --   < >  --  42.0  --   < > 39.2 43.0  --  42.0  --   --   --  36.1  --   --   --   PLT 197  --   --   --   --  178  --   --  157  --   --   --   --   --   --  132*  --   --   --   APTT  --   --  29  --   --   --   --   --   --   --   --   --   --   --   --   --   --   --   --   LABPROT  --   --  14.3  --   --   --   --   --   --   --   --   --   --   --   --   --   --   --   --   INR  --   --  1.10  --   --   --   --   --   --   --   --   --   --   --   --   --   --   --   --   HEPARINUNFRC  --   --   --   --   --   --   --   < > 0.42  --   < >  --   --  0.32  --   --  0.27*  --  0.60  CREATININE 1.26*  < > 1.23*  < > 1.36* 1.54*  --   < > 2.35* 2.40*  --  2.70*  < > 2.82* 2.76* 2.72*  --  2.61*  --   TROPONINI 0.81*  --   --   --  1.02*  --  1.04*  --   --   --   --   --   --   --   --   --   --   --   --   < > = values in this  interval not displayed.  Estimated Creatinine Clearance: 28.5 mL/min (by C-G formula based on Cr of 2.61).   Medications:   Scheduled:  . antiseptic oral rinse  7 mL Mouth Rinse QID  . budesonide (PULMICORT) nebulizer solution  0.5 mg Nebulization BID  . chlorhexidine  15 mL Mouth Rinse BID  . dolutegravir  50 mg Oral Daily  . etravirine  200 mg Oral BID WC  . feeding supplement (PRO-STAT SUGAR FREE 64)  30 mL Per Tube Daily  . feeding supplement (VITAL HIGH PROTEIN)  1,000 mL Per Tube Q24H  . fentaNYL  100 mcg Intravenous Once  . furosemide  40 mg Intravenous Q6H  . insulin aspart  2-6 Units Subcutaneous 6 times per day  . ipratropium-albuterol  3 mL Nebulization Q6H  . lopinavir-ritonavir  6.5 mL Per Tube BID AC  . metoprolol  5 mg Intravenous 4 times per day  . midazolam  2 mg Intravenous Once  . pantoprazole (PROTONIX) IV  40 mg Intravenous QHS  . sodium chloride  3 mL Intravenous Q12H    Assessment: 54 yo female s/p cardiac arrest (hypothermia protocol; rewarmed 3/16) and noted with PAF. She is reported on apixiban PTA but according to reports, she has not picked up the medication from the pharmacy.  Heparin is therapeutic on 1250 units/hr.    Noted poor prognosis post-hypoxic injury and plans for comfort care once family arrives.  Goal of Therapy:  Heparin level 0.3-0.7 units/ml Monitor platelets by anticoagulation protocol: Yes   Plan:  Continue heparin at 1250 units/hr Will need to continue daily labs as long as patient continues on heparin therapy. Consider discontinue heparin and labs as patient transitions to comfort care.  [employer], Pharm.D., BCPS Clinical Pharmacist Pager [phone] 11/22/2014 5:50 PM

## 2014-11-22 NOTE — Progress Notes (Signed)
eLink Physician-Brief Progress Note Patient Name: [name] DOB: [birth year] MRN: [id number]   Date of Service  11/22/2014  HPI/Events of Note  Hypokalemia (K+ = 2.9) and Creatinine = 2.72.  eICU Interventions  Will cautiously replace potassium.     Intervention Category Intermediate Interventions: Electrolyte abnormality - evaluation and management  [name] 11/22/2014, 4:36 AM
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
## 2014-11-22 NOTE — Progress Notes (Signed)
PULMONARY / CRITICAL CARE MEDICINE   Name: [name] MRN: [id number] DOB: 1961/06/04    ADMISSION DATE:  11/06/2014 CONSULTATION DATE:  11/22/2014  REFERRING MD :  EDP  CHIEF COMPLAINT:  Cardiac Arrest  INITIAL PRESENTATION:  54 y.o. F brought to [facility] ED 3/15 after suffering VF cardiac arrest while at her bank.  She was shocked a total of 6 times with roughly 15 - 20 minutes of resuscitation prior to ROSC.  In ED she remained unresponsive and PCCM consulted for consideration of hypothermia.     STUDIES:  CXR 3/15 >>> mild cardiomegaly, vascular congestion. CT Head 3/15 >>>  SIGNIFICANT EVENTS: 3/15 - admitted after cardiac arrest   HISTORY OF PRESENT ILLNESS:  Pt is encephalopathic; therefore, this HPI is obtained from chart review. [name] is a 54 y.o. F with PMH as outlined below which includes known HIV.  She was in her USOH until 3/15 when she collapsed and had a witnessed cardiac arrest while at her bank that morning.  EMS was dispatched and upon their arrival, pt noted to be in VF.  She was shocked 3 times with rhythm then converting to VT.  Shocked twice more with return to VF.  Shocked a sixth time before return to NSR and ROSC.  Total ACLS time roughly 15 - 20 minutes. On arrival to ED, she remained unresponsive and was intubated for respiratory insufficiency.  PCCM contacted for consideration of hypothermia.  HIV followed by Dr. Tommy Medal.  Per his last note 08/07/14, pt was previously controlled on STRIBILD but was only taking it 1/2 of the time she was supposed to due to severe foot pain (had extensive workup with Dr. Doran Durand in ortho with initial MRI concerning for osteo; however, biopsies returning as negative / benign bone).  Med regimen deemed inappropriate by Dr. Tommy Medal so pt switched to Prezcobix and Truvada with Intelence 400mg  daily.  SUBJECTIVE: No events overnight, paralytic off since 3 AM but no response noted.  VITAL SIGNS: Temp:  [97.9 F (36.6  C)-99.5 F (37.5 C)] 98.8 F (37.1 C) (03/18 0900) Pulse Rate:  [25-114] 25 (03/17 2300) Resp:  [18-24] 23 (03/18 0900) BP: (123)/(82) 123/82 mmHg (03/17 1130) SpO2:  [77 %-100 %] 96 % (03/18 0111) Arterial Line BP: (108-152)/(59-90) 143/78 mmHg (03/18 0900) FiO2 (%):  [30 %-40 %] 30 % (03/18 0900) Weight:  [99.03 kg (218 lb 5.1 oz)] 99.03 kg (218 lb 5.1 oz) (03/18 0200)   HEMODYNAMICS: CVP:  [6 mmHg-15 mmHg] 15 mmHg   VENTILATOR SETTINGS: Vent Mode:  [-] PRVC FiO2 (%):  [30 %-40 %] 30 % Set Rate:  [18 bmp-24 bmp] 24 bmp Vt Set:  [440 mL] 440 mL PEEP:  [5 cmH20] 5 cmH20 Plateau Pressure:  [12 cmH20-19 cmH20] 19 cmH20   INTAKE / OUTPUT: Intake/Output      03/17 0701 - 03/18 0700 03/18 0701 - 03/19 0700   P.O. 0    I.V. (mL/kg) 492.2 (5)    NG/GT 791.3    IV Piggyback 100    Total Intake(mL/kg) 1383.6 (14)    Urine (mL/kg/hr) 938 (0.4)    Emesis/NG output     Total Output 938     Net +445.6           PHYSICAL EXAMINATION: General: Obese female, unresponsive, sedated. Neuro: Unresponsive, sedated, pupils pin-point, no dolls eye reflex, present corneal reflex and respiratory drive, does not withdraw to pain but able to blink. HEENT: [place]/AT. PERRL, sclerae  anicteric. Cardiovascular: RRR, no M/R/G.  Lungs: Respirations even and unlabored.  Coarse bilaterally L > R. Abdomen: BS x 4, soft, NT/ND.  Musculoskeletal: No gross deformities, no edema.  Skin: Intact, warm, no rashes.  LABS:  CBC  Recent Labs Lab 11/20/14 0450  11/21/14 0330 11/21/14 0743 11/21/14 1144 11/22/14 0315  WBC 11.1*  --  14.8*  --   --  14.6*  HGB 13.4  < > 12.4 14.6 14.3 12.0  HCT 42.0  < > 39.2 43.0 42.0 36.1  PLT 178  --  157  --   --  132*  < > = values in this interval not displayed. Coag's  Recent Labs Lab 11/10/2014 1253 11/15/2014 2021  APTT 30 29  INR 1.27 1.10   BMET  Recent Labs Lab 11/22/14 0016 11/22/14 0315 11/22/14 0900  NA 141 138 141  K 3.3* 2.9* 3.2*  CL 114*  112 115*  CO2 19 18* 18*  BUN 46* 43* 48*  CREATININE 2.76* 2.72* 2.61*  GLUCOSE 211* 102* 85   Electrolytes  Recent Labs Lab 11/20/14 0450  11/21/14 0330  11/22/14 0016 11/22/14 0315 11/22/14 0900  CALCIUM 7.3*  < > 7.4*  < > 7.5* 7.3* 7.4*  MG 2.5  --  2.1  --   --  2.0  --   PHOS 3.8  --  5.2*  --   --  3.8  --   < > = values in this interval not displayed. Sepsis Markers  Recent Labs Lab 11/18/2014 1211  LATICACIDVEN 13.58*   ABG  Recent Labs Lab 11/21/14 0500 11/21/14 0955 11/22/14 0312  PHART 7.231* 7.337* 7.316*  PCO2ART 45.2* 26.4* 29.9*  PO2ART 124.0* 110.0* 82.5   Liver Enzymes  Recent Labs Lab 11/09/2014 1150 11/20/14 0450 11/21/14 0330  AST 106* 274* 158*  ALT 52* 133* 110*  ALKPHOS 70 76 68  BILITOT 0.6 0.5 0.5  ALBUMIN 2.7* 2.8* 2.4*   Cardiac Enzymes  Recent Labs Lab 11/18/2014 2000 11/20/14 0005 11/20/14 0737  TROPONINI 0.81* 1.02* 1.04*   Glucose  Recent Labs Lab 11/22/14 0044 11/22/14 0136 11/22/14 0304 11/22/14 0306 11/22/14 0348 11/22/14 0813  GLUCAP 68* 75 63* 59* 95 92    Imaging Dg Chest Port 1 View  11/22/2014   CLINICAL DATA:  ET tube placement  EXAM: PORTABLE CHEST - 1 VIEW  COMPARISON:  11/21/2014  FINDINGS: Endotracheal tube tip is 4 cm above the carina. Left jugular central line extends into the SVC. Nasogastric tube extends into the stomach. Dense consolidation persists in the left base. There is partial clearance of central airspace opacities bilaterally. Unchanged cardiomegaly.  IMPRESSION: Support equipment appears satisfactorily positioned. There is some improvement, with partial clearance of central airspace opacities although dense consolidation persists in the left base.   Electronically Signed   By: Andreas Newport M.D.   On: 11/22/2014 05:53   Dg Chest Port 1 View  11/21/2014   CLINICAL DATA:  Respiratory failure  EXAM: PORTABLE CHEST - 1 VIEW  COMPARISON:  11/20/2014  FINDINGS: Endotracheal tube tip is at  the level of the clavicular heads. Left jugular central line extends into the region of the cavoatrial junction. There is unchanged cardiomegaly. There is vascular and interstitial congestive change. There is persistent left base consolidation without significant interval change.  IMPRESSION: Support equipment appears satisfactorily positioned. No significant interval change.   Electronically Signed   By: Andreas Newport M.D.   On: 11/21/2014 06:46   Dg Chest [facility]  1 View  11/20/2014   CLINICAL DATA:  Endotracheal tube evaluation  EXAM: PORTABLE CHEST - 1 VIEW  COMPARISON:  11/16/2014  FINDINGS: Endotracheal tube with tip 2 cm above the carina. No change left internal jugular central line. NG tube again enters the stomach.  Moderate cardiac enlargement with mild vascular congestion. Increased opacity at both lung bases when compared to prior study.  IMPRESSION: Lines and tubes as described. Increased vascular congestion. Hazy bibasilar densities could represent a combination of airspace opacities and pleural effusions.   Electronically Signed   By: Skipper Cliche M.D.   On: 11/20/2014 13:22    ASSESSMENT / PLAN:  PULMONARY OETT 3/15 >>> A: Acute respiratory failure following cardiac arrest COPD without evidence of exacerbation Respiratory acidosis. P:   Begin PS trials but no extubation given mental status. VAP bundle. DuoNebs / Albuterol / Budesonide in lieu of outpatient beclomethasone. ABG and CXR in AM. Diureses.  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 Lopressor PRN for BP and HR. Goal CVP 8 - 12, currently 15. Diureses as ordered. Trend troponin / lactate. D/C stress dose steroids. UDS positive for benzos and cocaine. Heparin gtt in lieu of outpatient eliquis. Hold outpatient apixaban, carvedilol,  HCTZ, imdur, lisinopril, rosuvastatin.  RENAL A:   AGMA - lactate AKI At risk for metabolic derangements due to cold diuresis during hypothermia  Pseudohypocalcemia Hypokalemia P:   KVO IVF. BMP in AM. Trend lactate. Replace electrolytes as indicated. Lasix 40 mg IV q6.  GASTROINTESTINAL A:   Mild transaminitis GI prophylaxis Nutrition P:   LFT's in AM. SUP: Pantoprazole. TF.  HEMATOLOGIC A:   Anemia VTE Prophylaxis P:  Transfuse per usual ICU guidelines. SCD's / Heparin gtt. Coags q8hrs during hypothermia protocol. CBC in AM.  INFECTIOUS A:   Leukocytosis - no indication of infection at this point, likely acute phase reactant HIV - followed by Dr. Tommy Medal P:   Monitor clinically. ID consulted appreciated.  ENDOCRINE A:   Anticipate hyperglycemia during hypothermia protocol Hypothyroidism - not on outpatient meds P:   ICU hyperglycemia protocol. TSH.  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 P:   Sedation:  D/C all sedation. RASS goal: 0. Daily WUA until rewarming phase. EEG noted with burst suppression and diffuse slowness. Hold outpatient ambien, trazodone, percocet, morphine, gabapentin.  Family updated: Daughter updated bedside, informed that patient is likely to be in a persistent vegetative state and will need trach/peg if patient is to be maintained.  Daughter is to gather the rest of the family for a family meeting today.  Will recommend withdrawal of life supporting measures.  Interdisciplinary Family Meeting v Palliative Care Meeting:  Due by: 3/21.  The patient is critically ill with multiple organ systems failure and requires high complexity decision making for assessment and support, frequent evaluation and titration of therapies,  application of advanced monitoring technologies and extensive interpretation of multiple databases.   Critical Care Time devoted to patient care services described in this note is  35  Minutes. This time reflects time of care of this signee Dr Jennet Maduro. This critical care time does  not reflect procedure time, or teaching time or supervisory time of PA/NP/Med student/Med Resident etc but could involve care discussion time.  Rush Farmer, M.D. [facility] Pulmonary/Critical Care Medicine. Pager: [phone]. After hours pager: [phone].  11/22/2014, 9:45 AM

## 2014-11-22 NOTE — Progress Notes (Signed)
Hypoglycemic Event  CBG: 63  Treatment: D50 IV 25 mL  Symptoms: None  Follow-up CBG: Time:0345 CBG Result:95  Possible Reasons for Event: Unknown  Comments/MD notified:Dr. Oletta Darter called and updated, will continue to monitor.     [name] K  Remember to initiate Hypoglycemia Order Set & complete

## 2014-11-22 NOTE — Progress Notes (Signed)
Wasted 47mL of Versed bag in sink with Arma Heading, RN.

## 2014-11-22 NOTE — Progress Notes (Addendum)
Pt cbg 66 3/17 @ 2000, 1/2 amp D50 given, cbg 89. cbg at 0000 66, 1/2 amp D50 given, cbg 96. Dr. Oletta Darter called and made aware. Oders to start D5W @40mL /hour. Will continue to monitor.

## 2014-11-22 NOTE — Progress Notes (Signed)
ANTICOAGULATION CONSULT NOTE - Follow Up Consult  Pharmacy Consult for heparin Indication: atrial fibrillation  Allergies  Allergen Reactions  . Atorvastatin Other (See Comments)    Other reaction(s): Other (See Comments) Problems with memory  Patient states that the medication affects her memory    Patient Measurements: Height: 5\' 4"  (162.6 cm) Weight: 218 lb 5.1 oz (99.03 kg) IBW/kg (Calculated) : 54.7 Heparin Dosing Weight: 78kg  Vital Signs: Temp: 99.5 F (37.5 C) (03/18 0700) Temp Source: Core (Comment) (03/18 0700) Pulse Rate: 25 (03/17 2300)  Labs:  Recent Labs  11/22/2014 1253  11/27/2014 2000  11/30/2014 2021  11/20/14 0005 11/20/14 0450 11/20/14 0737  11/21/14 0330 11/21/14 0743 11/21/14 0930 11/21/14 1144  11/21/14 1900 11/22/14 0016 11/22/14 0315 11/22/14 0500  HGB  --   < > 13.0  < >  --   < >  --  13.4  --   < > 12.4 14.6  --  14.3  --   --   --  12.0  --   HCT  --   < > 40.4  < >  --   < >  --  42.0  --   < > 39.2 43.0  --  42.0  --   --   --  36.1  --   PLT  --   --  197  --   --   --   --  178  --   --  157  --   --   --   --   --   --  132*  --   APTT 30  --   --   --  29  --   --   --   --   --   --   --   --   --   --   --   --   --   --   LABPROT 16.1*  --   --   --  14.3  --   --   --   --   --   --   --   --   --   --   --   --   --   --   INR 1.27  --   --   --  1.10  --   --   --   --   --   --   --   --   --   --   --   --   --   --   HEPARINUNFRC  --   < >  --   --   --   --   --   --   --   < > 0.42  --  0.25*  --   --  0.32  --   --  0.27*  CREATININE 1.28*  < > 1.26*  < > 1.23*  < > 1.36* 1.54*  --   < > 2.35* 2.40*  --  2.70*  < > 2.82* 2.76* 2.72*  --   TROPONINI 0.11*  --  0.81*  --   --   --  1.02*  --  1.04*  --   --   --   --   --   --   --   --   --   --   < > = values in this interval not displayed.  Estimated Creatinine Clearance: 27.3 mL/min (by C-G formula based  on Cr of 2.72).   Medications:  Scheduled:  . antiseptic oral  rinse  7 mL Mouth Rinse QID  . budesonide (PULMICORT) nebulizer solution  0.5 mg Nebulization BID  . chlorhexidine  15 mL Mouth Rinse BID  . dolutegravir  50 mg Oral Daily  . etravirine  200 mg Oral BID WC  . feeding supplement (PRO-STAT SUGAR FREE 64)  30 mL Per Tube Daily  . feeding supplement (VITAL HIGH PROTEIN)  1,000 mL Per Tube Q24H  . fentaNYL  100 mcg Intravenous Once  . hydrocortisone sodium succinate  50 mg Intravenous Q6H  . insulin aspart  2-6 Units Subcutaneous 6 times per day  . ipratropium-albuterol  3 mL Nebulization Q6H  . lopinavir-ritonavir  6.5 mL Per Tube BID AC  . midazolam  2 mg Intravenous Once  . pantoprazole (PROTONIX) IV  40 mg Intravenous QHS  . sodium chloride  3 mL Intravenous Q12H    Assessment: 54 yo female s/p cardiac arrest (hypothermia protocol; rewarmed 3/16) and noted with PAF. She is reported on apixiban PTA but according to reports, she has not picked up the medication from the pharmacy.  Heparin is at 1100 units/hr and heparin level is below goal (HL= 0.27).  Goal of Therapy:  Heparin level 0.3-0.7 units/ml Monitor platelets by anticoagulation protocol: Yes   Plan:  -Increase heparin to 1250 units/hr -Heparin level in 8 hours and daily with CBC daily  Hildred Laser, Pharm D 11/22/2014 8:21 AM

## 2014-11-22 NOTE — Progress Notes (Signed)
Spoke with patient's two daughters extensively, updated them fully and informed that patient is going to remain in a persistent vegetative state per neuro's prognostication.  After discussion, decision was made to make patient a full DNR and once the entire family visits then will proceed to full comfort care.  Rush Farmer, M.D. [facility] Pulmonary/Critical Care Medicine. Pager: [phone]. After hours pager: [phone].

## 2014-11-22 NOTE — Progress Notes (Signed)
Hypoglycemic Event  CBG: 66    Treatment: D50 IV 25 mL  Symptoms: None  Follow-up CBG: Time:2007 CBG Result:89  Possible Reasons for Event: Unknown  Comments/MD notified:    [name]  Remember to initiate Hypoglycemia Order Set & complete

## 2014-11-22 NOTE — Progress Notes (Signed)
eLink Physician-Brief Progress Note Patient Name: [name] DOB: May 14, 1961 MRN: [id number]   Date of Service  11/22/2014  HPI/Events of Note  Family indicates to nursing that they plan to withdraw care later this evening Chart reviewed, currently DNR, had discussion with primary MD earlier today to this effect  eICU Interventions  Morphine gtt D/c superfluous orders     Intervention Category Major Interventions: End of life / care limitation discussion  Lamoine Magallon 11/22/2014, 6:14 PM

## 2014-11-22 NOTE — Progress Notes (Signed)
eLink Physician-Brief Progress Note Patient Name: [name] DOB: 08/21/1961 MRN: [id number]   Date of Service  11/22/2014  HPI/Events of Note  Family confused about signs they are seeing on exam, concerned that their mother is feeling pain and they don't know if this represents recovery or not.  Initially they were quite upset and requested that the code status should be reversed.  eICU Interventions  I spoke with Syreeta who shared her sister Shante's concerns.  I explained that their mother is not brain dead but appears to have severe brain injury.  I asked them to consider what their mother would tell them to do in this situation.  Syreeta agrees to maintain code status as DNR but wants to speak with bedside team in AM further before deciding to withdraw care.     Intervention Category Major Interventions: End of life / care limitation discussion  Lillyann Ahart 11/22/2014, 8:52 PM

## 2014-11-22 NOTE — Progress Notes (Signed)
Patient Name: [name] Date of Encounter: 11/22/2014  Active Problems:   Cardiac arrest   Acute respiratory failure   Anoxic encephalopathy   Cardiogenic shock   HIV (human immunodeficiency virus infection)   Paroxysmal atrial fibrillation   Anticoagulation management encounter   Ischemic cardiomyopathy   Acute on chronic systolic CHF (congestive heart failure), NYHA class 4   Length of Stay: 3  SUBJECTIVE  Off sedation since 5 am yesterday, no response to verbal or painful stimuli. Off levophed since yesterday, in ST with frequent PVCs and short runs of nsVT.  CURRENT MEDS . antiseptic oral rinse  7 mL Mouth Rinse QID  . budesonide (PULMICORT) nebulizer solution  0.5 mg Nebulization BID  . chlorhexidine  15 mL Mouth Rinse BID  . dolutegravir  50 mg Oral Daily  . etravirine  200 mg Oral BID WC  . feeding supplement (PRO-STAT SUGAR FREE 64)  30 mL Per Tube Daily  . feeding supplement (VITAL HIGH PROTEIN)  1,000 mL Per Tube Q24H  . fentaNYL  100 mcg Intravenous Once  . hydrocortisone sodium succinate  50 mg Intravenous Q6H  . insulin aspart  2-6 Units Subcutaneous 6 times per day  . ipratropium-albuterol  3 mL Nebulization Q6H  . lopinavir-ritonavir  6.5 mL Per Tube BID AC  . midazolam  2 mg Intravenous Once  . pantoprazole (PROTONIX) IV  40 mg Intravenous QHS  . sodium chloride  3 mL Intravenous Q12H    OBJECTIVE  Filed Vitals:   11/22/14 0400 11/22/14 0500 11/22/14 0600 11/22/14 0700  BP:      Pulse:      Temp: 98.8 F (37.1 C) 99.1 F (37.3 C) 99.5 F (37.5 C) 99.5 F (37.5 C)  TempSrc: Core (Comment) Core (Comment) Core (Comment) Core (Comment)  Resp: 21 24 21 24   Height:      Weight:      SpO2:        Intake/Output Summary (Last 24 hours) at 11/22/14 0846 Last data filed at 11/22/14 0600  Gross per 24 hour  Intake 1369.45 ml  Output    878 ml  Net 491.45 ml   Filed Weights   11/20/14 0400 11/21/14 0500 11/22/14 0200  Weight: 217 lb 9.9  oz (98.71 kg) 222 lb 0.1 oz (100.7 kg) 218 lb 5.1 oz (99.03 kg)    PHYSICAL EXAM  General: non-responsive HEENT: ET tube  Neck: Supple without bruits , unable to assess JVD. Lungs: Resp regular and unlabored, crackles B/L Heart: RRR no s3, s4, or murmurs. Abdomen: Soft, non-tender, non-distended, BS + x 4.  Extremities: No clubbing, cyanosis or edema. DP/PT/Radials 2+ and equal bilaterally. Accessory Clinical Findings  CBC  Recent Labs  11/25/2014 1150  11/21/14 0330  11/21/14 1144 11/22/14 0315  WBC 13.4*  < > 14.8*  --   --  14.6*  NEUTROABS 5.4  --   --   --   --   --   HGB 10.5*  < > 12.4  < > 14.3 12.0  HCT 35.6*  < > 39.2  < > 42.0 36.1  MCV 85.6  < > 81.5  --   --  78.3  PLT 170  < > 157  --   --  132*  < > = values in this interval not displayed. Basic Metabolic Panel  Recent Labs  11/21/14 0330  11/22/14 0016 11/22/14 0315  NA 140  < > 141 138  K 3.3*  < >  3.3* 2.9*  CL 112  < > 114* 112  CO2 19  < > 19 18*  GLUCOSE 127*  < > 211* 102*  BUN 34*  < > 46* 43*  CREATININE 2.35*  < > 2.76* 2.72*  CALCIUM 7.4*  < > 7.5* 7.3*  MG 2.1  --   --  2.0  PHOS 5.2*  --   --  3.8  < > = values in this interval not displayed. Liver Function Tests  Recent Labs  11/20/14 0450 11/21/14 0330  AST 274* 158*  ALT 133* 110*  ALKPHOS 76 68  BILITOT 0.5 0.5  PROT 7.1 6.7  ALBUMIN 2.8* 2.4*   No results for input(s): LIPASE, AMYLASE in the last 72 hours. Cardiac Enzymes  Recent Labs  12/02/2014 2000 11/20/14 0005 11/20/14 0737  TROPONINI 0.81* 1.02* 1.04*    Recent Labs  11/14/2014 1253  TSH 6.384*    Radiology/Studies  Ct Head Wo Contrast  11/06/2014   CLINICAL DATA:  Cardiac arrest.  Intracranial hemorrhage. IMPRESSION: Chronic ischemic changes as above. No acute abnormality. No intracranial hemorrhage.  Dg Chest Port 1 View  11/22/2014   CLINICAL DATA:  ET tube placement  EXAM: PORTABLE CHEST - 1 VIEW  COMPARISON:  11/21/2014  FINDINGS: Endotracheal  tube tip is 4 cm above the carina. Left jugular central line extends into the SVC. Nasogastric tube extends into the stomach. Dense consolidation persists in the left base. There is partial clearance of central airspace opacities bilaterally. Unchanged cardiomegaly.  IMPRESSION: Support equipment appears satisfactorily positioned. There is some improvement, with partial clearance of central airspace opacities although dense consolidation persists in the left base.     TELE: Sinus tachycardiac with frequent PVCs and short runs of nsVT  ECG: ST, negative t waves in the inferior leads, Q in anterior leads, PVCs    ASSESSMENT AND PLAN  The patient is a 54yo female with a history of CAD with stent In LAD at [facility] long time ago? More recent stent at [facility] in 2008 (in-stent restenosis). In hospital 4/13 with TIA. TEE by Dr Stanford Breed with small PFO no SOE. Her last echo was 05/2014 with EF 30-35%, Akinesis of the anteroseptal, anterior, and apical myocardium, moderate diffuse hypokinesis, LA moderately dilated. Her history also includes Cocaine use(+ in sept 2015),HLD, HTN, OSA(apparently unreceptive to CPAP), COPD, CHF, HIV(followed by Dr. Tommy Medal), Anxiety, depression, loop recorder(09/2013). She's been followed by Dr. Doran Durand for osteomyelitis. She was brought in by EMS after having a VF arrest while at the bank. Shock x6. 15-20 minutes of CPR before ROSC. Initial troponin 0.11. She was started on hypothermia protocol.   1. CAD, cath showed occluded LAD with right to left collaterals, high LVEDP, Troponin 0.8 --> 1.04, unable to use ASA, statins (elevated LFTs) and betablockers (hypotension on levophed). Possibly triggered by cocaine use. Not tested during this visit but positive in September 2015. Echo from yesterday shows Severe diffuse hypokinesis with akinesis of the apex with mild dyskinesis and overall LVEF 15%. The cavity size was mildly to moderately dilated. No definite clot was seen in  the apex. Poor prognosis, severely decreased function possibly due to prolonged CPR and hypoxia.  2. Prolonged QT/QTc, this is new change, most probably related to hypokalemia that is being treated, I have reviewed the list of her meds with the pharmacist and there is no red flag. We will keep following, No VTs on telemetry.  3. Parox a-fibrillation - now in SR, on Eliquis in  the past, seems that she wasn't complaint (our pharmacist found out that she never refilled her prescription). She had an episode yesterday.  Her CHADS-VASc is 5, including prior h/o stroke, started on iv Heparin.   4. Ischemic cardiomyopathy - LVEF 15%, unable to start ACEI or BB right now.   5. Acute on chronic systolic CHF - she is fluid overloaded, CVP today 15, we will start Lasix 40 mg iv q6h  6. Sinus tachycardia, nsVT - off levophed, we will add metoprolol 5 mg iv q6h  7 Hypokalemia - we will replace   8.Prognosis - poor based on EEG showing burst suppression and diffuse slowness.   Signed, Dorothy Spark MD, [facility] 11/22/2014

## 2014-11-22 NOTE — Progress Notes (Signed)
Hypoglycemic Event  CBG: 68  Treatment: D50 IV 25 mL  Symptoms: None  Follow-up CBG: Time:23:42 CBG Result:96  Possible Reasons for Event: Unknown  Comments/MD notified:Dr. Oletta Darter called, D5W @40mL /hour started.     Unique Searfoss K  Remember to initiate Hypoglycemia Order Set & complete

## 2014-11-23 LAB — BASIC METABOLIC PANEL
ANION GAP: 11 (ref 5–15)
BUN: 55 mg/dL — ABNORMAL HIGH (ref 6–23)
CHLORIDE: 112 mmol/L (ref 96–112)
CO2: 17 mmol/L — ABNORMAL LOW (ref 19–32)
Calcium: 8.3 mg/dL — ABNORMAL LOW (ref 8.4–10.5)
Creatinine, Ser: 2.58 mg/dL — ABNORMAL HIGH (ref 0.50–1.10)
GFR calc Af Amer: 23 mL/min — ABNORMAL LOW (ref >90)
GFR, EST NON AFRICAN AMERICAN: 20 mL/min — AB (ref >90)
Glucose, Bld: 68 mg/dL — ABNORMAL LOW (ref 70–99)
POTASSIUM: 3.5 mmol/L (ref 3.5–5.1)
Sodium: 140 mmol/L (ref 135–145)

## 2014-11-23 LAB — GLUCOSE, CAPILLARY
Glucose-Capillary: 199 mg/dL — ABNORMAL HIGH (ref 70–99)
Glucose-Capillary: 63 mg/dL — ABNORMAL LOW (ref 70–99)

## 2014-11-23 LAB — HIV-1 RNA ULTRAQUANT REFLEX TO GENTYP+
HIV-1 RNA BY PCR: 250 copies/mL
HIV-1 RNA QUANT, LOG: 2.398 {Log_copies}/mL

## 2014-11-23 MED ORDER — DEXTROSE 50 % IV SOLN
25.0000 mL | Freq: Once | INTRAVENOUS | Status: AC
  Administered 2014-11-23: 25 mL via INTRAVENOUS

## 2014-11-23 MED ORDER — DEXTROSE 50 % IV SOLN
INTRAVENOUS | Status: AC
  Filled 2014-11-23: qty 50

## 2014-11-23 MED ORDER — MORPHINE BOLUS VIA INFUSION
5.0000 mg | INTRAVENOUS | Status: DC | PRN
  Filled 2014-11-23: qty 20

## 2014-11-23 MED ORDER — DEXTROSE 5 % IV SOLN
10.0000 mg/h | INTRAVENOUS | Status: DC
  Administered 2014-11-23: 10 mg/h via INTRAVENOUS
  Administered 2014-11-23: 20 mg/h via INTRAVENOUS
  Filled 2014-11-23 (×2): qty 10

## 2014-11-23 NOTE — Progress Notes (Signed)
Patient Name: [name] Date of Encounter: 11/27/2014  Active Problems:   Cardiac arrest   Acute respiratory failure   Anoxic encephalopathy   Cardiogenic shock   HIV (human immunodeficiency virus infection)   Paroxysmal atrial fibrillation   Anticoagulation management encounter   Ischemic cardiomyopathy   Acute on chronic systolic CHF (congestive heart failure), NYHA class 4   Length of Stay: 4  SUBJECTIVE  Off sedation for the last two days, neurologically poor prognosis. Withdrawal of care was discussed with family yesterday but haven't decided yet. It will be readdressed today again. All the therapy was held yesterday. No response to verbal or painful stimuli.   CURRENT MEDS . antiseptic oral rinse  7 mL Mouth Rinse QID  . chlorhexidine  15 mL Mouth Rinse BID  . dextrose      . fentaNYL  100 mcg Intravenous Once  . midazolam  2 mg Intravenous Once    OBJECTIVE  Filed Vitals:   11/22/2014 0600 12/04/2014 0700 11/30/2014 0800 11/06/2014 0834  BP:    125/67  Pulse:   64 122  Temp: 98.6 F (37 C)   98.8 F (37.1 C)  TempSrc: Core (Comment)   Core (Comment)  Resp: 23 24 26 25   Height:      Weight:      SpO2: 100%  97% 100%    Intake/Output Summary (Last 24 hours) at 11/13/2014 0912 Last data filed at 11/22/2014 0834  Gross per 24 hour  Intake 1372.5 ml  Output   3375 ml  Net -2002.5 ml   Filed Weights   11/20/14 0400 11/21/14 0500 11/22/14 0200  Weight: 217 lb 9.9 oz (98.71 kg) 222 lb 0.1 oz (100.7 kg) 218 lb 5.1 oz (99.03 kg)    PHYSICAL EXAM  General: non-responsive HEENT: ET tube  Neck: Supple without bruits , unable to assess JVD. Lungs: Resp regular and unlabored, crackles B/L Heart: RRR no s3, s4, or murmurs. Abdomen: Soft, non-tender, non-distended, BS + x 4.  Extremities: No clubbing, cyanosis or edema. DP/PT/Radials 2+ and equal bilaterally. Accessory Clinical Findings  CBC  Recent Labs  11/21/14 0330  11/21/14 1144 11/22/14 0315    WBC 14.8*  --   --  14.6*  HGB 12.4  < > 14.3 12.0  HCT 39.2  < > 42.0 36.1  MCV 81.5  --   --  78.3  PLT 157  --   --  132*  < > = values in this interval not displayed. Basic Metabolic Panel  Recent Labs  11/21/14 0330  11/22/14 0315 11/22/14 0900 11/13/2014 0430  NA 140  < > 138 141 140  K 3.3*  < > 2.9* 3.2* 3.5  CL 112  < > 112 115* 112  CO2 19  < > 18* 18* 17*  GLUCOSE 127*  < > 102* 85 68*  BUN 34*  < > 43* 48* 55*  CREATININE 2.35*  < > 2.72* 2.61* 2.58*  CALCIUM 7.4*  < > 7.3* 7.4* 8.3*  MG 2.1  --  2.0  --   --   PHOS 5.2*  --  3.8  --   --   < > = values in this interval not displayed. Liver Function Tests  Recent Labs  11/21/14 0330  AST 158*  ALT 110*  ALKPHOS 68  BILITOT 0.5  PROT 6.7  ALBUMIN 2.4*   No results for input(s): LIPASE, AMYLASE in the last 72 hours. Cardiac Enzymes No results for  input(s): CKTOTAL, CKMB, CKMBINDEX, TROPONINI in the last 72 hours. No results for input(s): TSH, T4TOTAL, T3FREE, THYROIDAB in the last 72 hours.  Invalid input(s): FREET3  Radiology/Studies  Ct Head Wo Contrast  11/18/2014   CLINICAL DATA:  Cardiac arrest.  Intracranial hemorrhage. IMPRESSION: Chronic ischemic changes as above. No acute abnormality. No intracranial hemorrhage.  Dg Chest Port 1 View  11/22/2014   CLINICAL DATA:  ET tube placement  EXAM: PORTABLE CHEST - 1 VIEW  COMPARISON:  11/21/2014  FINDINGS: Endotracheal tube tip is 4 cm above the carina. Left jugular central line extends into the SVC. Nasogastric tube extends into the stomach. Dense consolidation persists in the left base. There is partial clearance of central airspace opacities bilaterally. Unchanged cardiomegaly.  IMPRESSION: Support equipment appears satisfactorily positioned. There is some improvement, with partial clearance of central airspace opacities although dense consolidation persists in the left base.     TELE: Sinus tachycardiac with frequent PVCs and short runs of nsVT  ECG:  ST, negative t waves in the inferior leads, Q in anterior leads, PVCs    ASSESSMENT AND PLAN  The patient is a 54yo female with a history of CAD with stent In LAD at [facility] long time ago? More recent stent at [facility] in 2008 (in-stent restenosis). In hospital 4/13 with TIA. TEE by Dr Stanford Breed with small PFO no SOE. Her last echo was 05/2014 with EF 30-35%, Akinesis of the anteroseptal, anterior, and apical myocardium, moderate diffuse hypokinesis, LA moderately dilated. Her history also includes Cocaine use(+ in sept 2015),HLD, HTN, OSA(apparently unreceptive to CPAP), COPD, CHF, HIV(followed by Dr. Tommy Medal), Anxiety, depression, loop recorder(09/2013). She's been followed by Dr. Doran Durand for osteomyelitis. She was brought in by EMS after having a VF arrest while at the bank. Shock x6. 15-20 minutes of CPR before ROSC. Initial troponin 0.11. She underwent hypothermia protocol.   1. CAD, cath showed occluded LAD with right to left collaterals, high LVEDP, Troponin 0.8 --> 1.04, unable to use ASA, statins (elevated LFTs) and betablockers (hypotension on levophed). Possibly triggered by cocaine use. Not tested during this visit but positive in September 2015. Echo from yesterday shows Severe diffuse hypokinesis with akinesis of the apex with mild dyskinesis and overall LVEF 15%. The cavity size was mildly to moderately dilated. No definite clot was seen in the apex. Poor prognosis, severely decreased function possibly due to prolonged CPR and hypoxia.  2. Prolonged QT/QTc, this is new change, most probably related to hypokalemia that is being treated, I have reviewed the list of her meds with the pharmacist and there is no red flag. We will keep following, No VTs on telemetry.  3. Parox a-fibrillation - now in SR, on Eliquis in the past, seems that she wasn't complaint (our pharmacist found out that she never refilled her prescription). She had an episode yesterday.  Her CHADS-VASc is 5,  including prior h/o stroke, started on iv Heparin.   4. Ischemic cardiomyopathy - LVEF 15%, unable to start ACEI or BB right now.   5. Acute on chronic systolic CHF - she is fluid overloaded, CVP today 15, we will start Lasix 40 mg iv q6h  6. Sinus tachycardia, nsVT - off levophed, we will add metoprolol 5 mg iv q6h  7 Hypokalemia - we will replace   8.Prognosis - very poor.   Please call us if any questions arise regarding heart rhythm and CHF, otherwise we will sign off.  Signed, Dorothy Spark MD, [facility] 11/25/2014

## 2014-11-23 NOTE — Progress Notes (Signed)
PULMONARY / CRITICAL CARE MEDICINE   Name: [name] MRN: [id number] DOB: 08-17-1961    ADMISSION DATE:  11/15/2014 CONSULTATION DATE:  11/19/2014  REFERRING MD :  EDP  CHIEF COMPLAINT:  Cardiac Arrest  INITIAL PRESENTATION:  54 y.o. F brought to [facility] ED 3/15 after suffering VF cardiac arrest while at her bank.  She was shocked a total of 6 times with roughly 15 - 20 minutes of resuscitation prior to ROSC.  In ED she remained unresponsive and PCCM consulted for consideration of hypothermia.     STUDIES:  CXR 3/15 >>> mild cardiomegaly, vascular congestion. CT Head 3/15 >>>  SIGNIFICANT EVENTS: 3/15 - admitted after cardiac arrest   HISTORY OF PRESENT ILLNESS:  [name] encephalopathic; therefore, this HPI Brooke obtained from chart review. [name] Brooke a 54 y.o. F with PMH as outlined below which includes known HIV.  She was in her USOH until 3/15 when she collapsed and had a witnessed cardiac arrest while at her bank that morning.  EMS was dispatched and upon their arrival, pt noted to be in VF.  She was shocked 3 times with rhythm then converting to VT.  Shocked twice more with return to VF.  Shocked a sixth time before return to NSR and ROSC.  Total ACLS time roughly 15 - 20 minutes. On arrival to ED, she remained unresponsive and was intubated for respiratory insufficiency.  PCCM contacted for consideration of hypothermia.  HIV followed by [name].  Per his last note 08/07/14, pt was previously controlled on STRIBILD but was only taking it 1/2 of the time she was supposed to due to severe foot pain (had extensive workup with [name] in ortho with initial MRI concerning for osteo; however, biopsies returning as negative / benign bone).  Med regimen deemed inappropriate by [name] so pt switched to Prezcobix and Truvada with Intelence 400mg  daily.  SUBJECTIVE:  Plan had been for withdrawal of care 3/18 but pt's movements raised question of her ability to improve so this  was deferred. [name] spoke to pt's daughters and plan was made to revisit this am, answer questions, decide on agressiveness of treatment.   VITAL SIGNS: Temp:  [98.1 F (36.7 C)-99.3 F (37.4 C)] 98.8 F (37.1 C) (03/19 0834) Pulse Rate:  [30-122] 122 (03/19 0834) Resp:  [21-26] 25 (03/19 0834) BP: (107-125)/(67-74) 125/67 mmHg (03/19 0834) SpO2:  [95 %-100 %] 100 % (03/19 0834) Arterial Line BP: (103-139)/(56-88) 124/67 mmHg (03/19 0834) FiO2 (%):  [30 %-100 %] 30 % (03/19 0834)   HEMODYNAMICS: CVP:  [5 mmHg] 5 mmHg   VENTILATOR SETTINGS: Vent Mode:  [-] PRVC FiO2 (%):  [30 %-100 %] 30 % Set Rate:  [24 bmp] 24 bmp Vt Set:  [440 mL] 440 mL PEEP:  [5 cmH20] 5 cmH20 Plateau Pressure:  [11 cmH20-19 cmH20] 17 cmH20   INTAKE / OUTPUT: Intake/Output      03/18 0701 - 03/19 0700 03/19 0701 - 03/20 0700   P.O.     I.V. (mL/kg) 471 (4.8)    NG/GT 525 30   IV Piggyback 600    Total Intake(mL/kg) 1596 (16.1) 30 (0.3)   Urine (mL/kg/hr) 3550 (1.5) 275 (0.8)   Total Output 3550 275   Net -1954 -245         PHYSICAL EXAMINATION: General: Obese female, unresponsive, sedated. Neuro: Unresponsive, sedated, pupils pin-point, no dolls eye reflex, present corneal reflex and respiratory drive, does not withdraw to  pain but able to blink. HEENT: [name]. PERRL, sclerae anicteric. Cardiovascular: RRR, no M/R/G.  Lungs: Respirations even and unlabored.  Coarse bilaterally L > R. Abdomen: BS x 4, soft, NT/ND.  Musculoskeletal: No gross deformities, no edema.  Skin: Intact, warm, no rashes.  LABS:  CBC  Recent Labs Lab 11/20/14 0450  11/21/14 0330 11/21/14 0743 11/21/14 1144 11/22/14 0315  WBC 11.1*  --  14.8*  --   --  14.6*  HGB 13.4  < > 12.4 14.6 14.3 12.0  HCT 42.0  < > 39.2 43.0 42.0 36.1  PLT 178  --  157  --   --  132*  < > = values in this interval not displayed. Coag's  Recent Labs Lab 11/11/2014 1253 11/15/2014 2021  APTT 30 29  INR 1.27 1.10   BMET  Recent  Labs Lab 11/22/14 0315 11/22/14 0900 11/22/2014 0430  NA 138 141 140  K 2.9* 3.2* 3.5  CL 112 115* 112  CO2 18* 18* 17*  BUN 43* 48* 55*  CREATININE 2.72* 2.61* 2.58*  GLUCOSE 102* 85 68*   Electrolytes  Recent Labs Lab 11/20/14 0450  11/21/14 0330  11/22/14 0315 11/22/14 0900 11/20/2014 0430  CALCIUM 7.3*  < > 7.4*  < > 7.3* 7.4* 8.3*  MG 2.5  --  2.1  --  2.0  --   --   PHOS 3.8  --  5.2*  --  3.8  --   --   < > = values in this interval not displayed. Sepsis Markers  Recent Labs Lab 11/10/2014 1211  LATICACIDVEN 13.58*   ABG  Recent Labs Lab 11/21/14 0955 11/22/14 0312 11/22/14 1529  PHART 7.337* 7.316* 7.355  PCO2ART 26.4* 29.9* 33.6*  PO2ART 110.0* 82.5 119.0*   Liver Enzymes  Recent Labs Lab 11/05/2014 1150 11/20/14 0450 11/21/14 0330  AST 106* 274* 158*  ALT 52* 133* 110*  ALKPHOS 70 76 68  BILITOT 0.6 0.5 0.5  ALBUMIN 2.7* 2.8* 2.4*   Cardiac Enzymes  Recent Labs Lab 11/13/2014 2000 11/20/14 0005 11/20/14 0737  TROPONINI 0.81* 1.02* 1.04*   Glucose  Recent Labs Lab 11/22/14 0348 11/22/14 0813 11/22/14 1201 11/22/14 1213 11/22/14 1552 11/22/14 1601  GLUCAP 95 92 77 140* 63* 199*    Imaging Dg Chest Port 1 View  11/22/2014   CLINICAL DATA:  ET tube placement  EXAM: PORTABLE CHEST - 1 VIEW  COMPARISON:  11/21/2014  FINDINGS: Endotracheal tube tip Brooke 4 cm above the carina. Left jugular central line extends into the SVC. Nasogastric tube extends into the stomach. Dense consolidation persists in the left base. There Brooke partial clearance of central airspace opacities bilaterally. Unchanged cardiomegaly.  IMPRESSION: Support equipment appears satisfactorily positioned. There Brooke some improvement, with partial clearance of central airspace opacities although dense consolidation persists in the left base.   Electronically Signed   By: Andreas Newport M.D.   On: 11/22/2014 05:53    ASSESSMENT / PLAN:  PULMONARY OETT 3/15 >>> A: Acute  respiratory failure following cardiac arrest COPD without evidence of exacerbation Respiratory acidosis. P:   Begin PS trials but no extubation given poor mental status. VAP bundle. DuoNebs continued, pulmicort d/c'd Suspect we will withdraw MV when family ready / at peace with this decision  CARDIOVASCULAR CVL L IJ 3/15 >>> A:  S/p VF cardiac arrest Concern for cocaine toxicity - has hx of positive UDS (cocaine and opiates 05/30/14) Hx HTN, combined CHF (EF 30-35% from 05/30/14), MI, angina P:  Lopressor PRN for BP and HR. Goal CVP 8 - 12 Diureses held Trend troponin / lactate. Heparin and steroids stopped Hold outpatient apixaban, carvedilol, HCTZ, imdur, lisinopril, rosuvastatin.  RENAL A:   AGMA - lactate AKI Pseudohypocalcemia Hypokalemia P:   KVO IVF. Minimize labs draws at this point, S Cr on 3/19 stable  GASTROINTESTINAL A:   Mild transaminitis GI prophylaxis Nutrition P:   Pantoprazole and TF stopped 3/18  HEMATOLOGIC A:   Anemia VTE Prophylaxis P:  No plans to transfuse Minimize lab draws  INFECTIOUS A:   Leukocytosis - no indication of infection at this point, likely acute phase reactant HIV - followed by [name] P:   Monitor clinically. ID consulted appreciated.  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 Hx CVA, Depression, chronic lower back pain Positive UDS in past  P:   Sedation:  All sedation has been held RASS goal: 0. EEG noted with burst suppression and diffuse slowness. Hold outpatient ambien, trazodone, percocet, morphine, gabapentin.   Family updated by Brooke Lamonte Sakai 3/19:  Daughter and extended family updated bedside, discussed poor likelihood for any recovery. They understand and  want to move forward with withdrawal of care. I will arrange for this am - family Brooke ready.   Independent CC time 35 minutes  Baltazar Apo, MD, PhD 11/11/2014, 11:03 AM [facility] [phone] or if no answer [phone]

## 2014-11-23 NOTE — ED Provider Notes (Addendum)
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  COMPARISON:  11/21/2014  FINDINGS: Endotracheal tube tip is 4 cm above the carina. Left jugular central line extends into the SVC. Nasogastric tube extends into the stomach. Dense consolidation persists in the left base. There is partial clearance of central airspace opacities bilaterally. Unchanged cardiomegaly.  IMPRESSION: Support equipment appears satisfactorily positioned. There is some improvement, with partial clearance of central airspace opacities although dense consolidation persists in the left base.   Electronically Signed   By: Andreas Newport M.D.   On: 11/22/2014 05:53     EKG Interpretation None      MDM   Final diagnoses:  ICH (intracerebral hemorrhage)  History of ETT    Patient status post V. fib present cardiac arrest. Hemodynamically stable here. No  sign of acute MI or ST elevations. Undergoing an amiodarone infusion. I discussed the case with cardiology, as well as critical care. Patient had large cuff leak and difficulty oxygenating the leg patient on arrival S her ET tube was changed. Following changing of her ET tube chest x-ray shows proper placement however 2. No acute pulmonary abnormalities noted.  INTUBATION Performed by: Lolita Patella  Required items: required blood products, implants, devices, and special equipment available Patient identity confirmed: provided demographic data and hospital-assigned identification number Time out: Immediately prior to procedure a "time out" was called to verify the correct patient, procedure, equipment, support staff and site/side marked as required.  Indications: Cardiac arrest, ETTT cuff leak  Intubation method: YesGlidescope Laryngoscopy   Preoxygenation: BVM  Sedatives: 20mg  Etomidate Paralytic: 150mg  Succinylcholine  Tube Size: 7.5 cuffed  Post-procedure assessment: chest rise and ETCO2 monitor Breath sounds: equal and absent over the epigastrium Tube secured with: ETT holder Chest x-ray interpreted by radiologist and me.  Chest x-ray findings: endotracheal tube in appropriate position  Patient tolerated the procedure well with no immediate complications.      Tanna Furry, MD 11/12/2014 [id number]  Tanna Furry, MD 12/14/14 [phone]

## 2014-11-23 NOTE — Procedures (Signed)
Extubation Procedure Note  Patient Details:   Name: [name] DOB: Oct 22, 1960 MRN: [id number]   Airway Documentation:     Evaluation  O2 sats: currently acceptable Complications: No apparent complications Patient did tolerate procedure well. Bilateral Breath Sounds: Rhonchi, Diminished Suctioning: Oral No   Pt extubated to comfort care only, per MD order. Pt on Room Air. Pt presents with increased oral secretions. RN cleansed pt's mouth and suctioned out. RT will continue to monitor.   Jesse Sans 12/01/2014, 1:09 PM

## 2014-11-23 NOTE — Progress Notes (Signed)
Pt's daughter concern about plan of withdrawal, wanted to wait until morning to discuss further. The daughter also stated concern for code status. RN called Dr. Lake Bells, no change in current orders. Dr. Lake Bells stated he will follow up with daughter.

## 2014-11-23 NOTE — Progress Notes (Signed)
Chaplain responded to page from nurse that pt was actively dying.  Upon arriving at bedside, RN, pt's daughter and son were grieving, daughter holding pt's hand.  Chaplain provided calming presence and emotional support as pt passed.  Chaplain provided family with pt placement card and walked out with family.      11/30/2014 2300  Clinical Encounter Type  Visited With Patient and family together  Visit Type Initial;Psychological support;Critical Care;Death;Patient actively dying  Referral From Nurse  Spiritual Encounters  Spiritual Needs Emotional;Grief support  Stress Factors  Family Stress Factors Loss   Geralyn Flash 11/22/2014 11:28 PM

## 2014-11-25 ENCOUNTER — Encounter: Payer: Commercial Managed Care - HMO | Admitting: Internal Medicine

## 2014-11-27 ENCOUNTER — Telehealth: Payer: Self-pay

## 2014-11-27 NOTE — Telephone Encounter (Signed)
Received cremation certificate 10/17/92 from [facility].  Sending to 2100 for Dr. Lamonte Sakai.  Received back and called [facility] for pick-up 11/27/14.

## 2014-11-29 ENCOUNTER — Encounter: Payer: Self-pay | Admitting: Cardiology

## 2014-12-06 ENCOUNTER — Encounter: Payer: Self-pay | Admitting: Cardiology

## 2014-12-06 NOTE — Progress Notes (Signed)
Patient expired at 2311 with family present at bedside. Patient's daughter was unaware of funeral arrangements at present time. Patient placement card was given to her. Additionally, hand prints were made for the family. Chaplin at bedside. This nurse and Rachel Moulds RN assessed heart tone for 5 minutes each. No heart sounds present. Asystole on the monitor and no breathing detected. Dr. Jimmy Footman made aware.  Daughter Lesleigh Noe took patient's home medications that were in the room home with her. There was no other personal belongs in the room. [place] donor was called. Morphine infusion wasted down sink and witnessed by Rachel Moulds RN and this RN. Total wasted was 240 ml .

## 2014-12-06 DEATH — deceased

## 2014-12-12 NOTE — Discharge Summary (Signed)
PULMONARY / CRITICAL CARE MEDICINE DEATH SUMMARY   Name: [name] MRN: [id number] DOB: 1960-12-27    ADMISSION DATE:  2014/11/21 Date of death: 11-26-2014  Final cause of death Anoxic brain injury and encephalopathy  Secondary causes of death Ventricular fibrillation with cardiac arrest Acute respiratory failure with hypercapnia and hypoxemia COPD without evidence of acute exacerbation History of hypertension History of chronic systolic and diastolic CHF Coronary artery disease Stress non-ST elevation MI History of cocaine abuse Metabolic acidosis, lactic acidosis Acute renal failure Hypokalemia Shock liver Protein calorie malnutrition Anemia of chronic disease HIV Hypothyroidism History of CVA     Hospital course:  55 y.o. F brought to [facility] ED November 21, 2022 after suffering VF cardiac arrest while at her bank.  She was shocked a total of 6 times with roughly 15 - 20 minutes of resuscitation prior to ROSC.  In ED she remained unresponsive and induced hypothermia was performed. She experienced hypoxemic injuries to renal, hepatic and neurologic systems.  Unfortunately when hypothermia was completed and sedation was lifted she showed signs of severe neurologic injury. Her EEG showed a burst suppression pattern with diffuse slowness. It was felt that her chances for a meaningful neurological improvement were extremely low. This was discussed with the patient's family and decision was made to withdraw mechanical ventilation in extraordinary support on 11-26-2014. She expired later that day   STUDIES:  CXR 11-21-2022 >>> mild cardiomegaly, vascular congestion. CT Head 2022/11/21 >>>  SIGNIFICANT EVENTS: 2022/11/21 - admitted after cardiac arrest   Baltazar Apo, MD, PhD 12/12/2014, 4:14 PM [facility] [phone] or if no answer [phone]
